# Patient Record
Sex: Male | Born: 1940 | Race: White | Hispanic: No | Marital: Married | Smoking: Former smoker
Health system: Southern US, Community
[De-identification: ages and names within clinical notes are randomized; demographics above are authoritative.]

## PROBLEM LIST (undated history)

## (undated) DIAGNOSIS — K449 Diaphragmatic hernia without obstruction or gangrene: Secondary | ICD-10-CM

## (undated) DIAGNOSIS — D1803 Hemangioma of intra-abdominal structures: Secondary | ICD-10-CM

## (undated) DIAGNOSIS — S8290XA Unspecified fracture of unspecified lower leg, initial encounter for closed fracture: Secondary | ICD-10-CM

## (undated) DIAGNOSIS — C4491 Basal cell carcinoma of skin, unspecified: Secondary | ICD-10-CM

## (undated) DIAGNOSIS — K5792 Diverticulitis of intestine, part unspecified, without perforation or abscess without bleeding: Secondary | ICD-10-CM

## (undated) DIAGNOSIS — E119 Type 2 diabetes mellitus without complications: Secondary | ICD-10-CM

## (undated) DIAGNOSIS — E785 Hyperlipidemia, unspecified: Secondary | ICD-10-CM

## (undated) DIAGNOSIS — N529 Male erectile dysfunction, unspecified: Secondary | ICD-10-CM

## (undated) DIAGNOSIS — E042 Nontoxic multinodular goiter: Secondary | ICD-10-CM

## (undated) DIAGNOSIS — H269 Unspecified cataract: Secondary | ICD-10-CM

## (undated) DIAGNOSIS — N200 Calculus of kidney: Secondary | ICD-10-CM

## (undated) DIAGNOSIS — K219 Gastro-esophageal reflux disease without esophagitis: Secondary | ICD-10-CM

## (undated) DIAGNOSIS — I2510 Atherosclerotic heart disease of native coronary artery without angina pectoris: Secondary | ICD-10-CM

## (undated) DIAGNOSIS — E559 Vitamin D deficiency, unspecified: Secondary | ICD-10-CM

## (undated) DIAGNOSIS — I469 Cardiac arrest, cause unspecified: Secondary | ICD-10-CM

## (undated) HISTORY — DX: Calculus of kidney: N20.0

## (undated) HISTORY — PX: TIBIA FRACTURE SURGERY: SHX806

## (undated) HISTORY — DX: Nontoxic multinodular goiter: E04.2

## (undated) HISTORY — DX: Hyperlipidemia, unspecified: E78.5

## (undated) HISTORY — DX: Gastro-esophageal reflux disease without esophagitis: K21.9

## (undated) HISTORY — PX: EYE SURGERY: SHX253

## (undated) HISTORY — DX: Unspecified fracture of unspecified lower leg, initial encounter for closed fracture: S82.90XA

## (undated) HISTORY — DX: Basal cell carcinoma of skin, unspecified: C44.91

## (undated) HISTORY — DX: Atherosclerotic heart disease of native coronary artery without angina pectoris: I25.10

## (undated) HISTORY — DX: Diaphragmatic hernia without obstruction or gangrene: K44.9

## (undated) HISTORY — DX: Vitamin D deficiency, unspecified: E55.9

## (undated) HISTORY — DX: Cardiac arrest, cause unspecified: I46.9

## (undated) HISTORY — PX: OTHER SURGICAL HISTORY: SHX169

## (undated) HISTORY — DX: Hemangioma of intra-abdominal structures: D18.03

## (undated) HISTORY — PX: CATARACT EXTRACTION: SUR2

## (undated) HISTORY — DX: Male erectile dysfunction, unspecified: N52.9

## (undated) HISTORY — DX: Diverticulitis of intestine, part unspecified, without perforation or abscess without bleeding: K57.92

## (undated) HISTORY — DX: Unspecified cataract: H26.9

---

## 1978-04-19 DIAGNOSIS — N200 Calculus of kidney: Secondary | ICD-10-CM

## 1978-04-19 HISTORY — DX: Calculus of kidney: N20.0

## 1982-04-19 HISTORY — PX: BACK SURGERY: SHX140

## 2000-03-07 ENCOUNTER — Ambulatory Visit (HOSPITAL_COMMUNITY): Admission: RE | Admit: 2000-03-07 | Discharge: 2000-03-07 | Payer: Self-pay | Admitting: Family Medicine

## 2000-03-07 ENCOUNTER — Encounter: Payer: Self-pay | Admitting: Family Medicine

## 2000-03-21 ENCOUNTER — Encounter: Payer: Self-pay | Admitting: Family Medicine

## 2000-03-21 ENCOUNTER — Ambulatory Visit (HOSPITAL_COMMUNITY): Admission: RE | Admit: 2000-03-21 | Discharge: 2000-03-21 | Payer: Self-pay | Admitting: Family Medicine

## 2000-03-29 ENCOUNTER — Other Ambulatory Visit: Admission: RE | Admit: 2000-03-29 | Discharge: 2000-03-29 | Payer: Self-pay | Admitting: General Surgery

## 2000-11-10 ENCOUNTER — Ambulatory Visit (HOSPITAL_BASED_OUTPATIENT_CLINIC_OR_DEPARTMENT_OTHER): Admission: RE | Admit: 2000-11-10 | Discharge: 2000-11-10 | Payer: Self-pay | Admitting: Plastic Surgery

## 2000-11-10 ENCOUNTER — Encounter (INDEPENDENT_AMBULATORY_CARE_PROVIDER_SITE_OTHER): Payer: Self-pay | Admitting: *Deleted

## 2000-11-30 ENCOUNTER — Encounter: Payer: Self-pay | Admitting: General Surgery

## 2000-11-30 ENCOUNTER — Ambulatory Visit (HOSPITAL_COMMUNITY): Admission: RE | Admit: 2000-11-30 | Discharge: 2000-11-30 | Payer: Self-pay | Admitting: General Surgery

## 2001-07-06 ENCOUNTER — Encounter: Payer: Self-pay | Admitting: Cardiology

## 2004-10-15 ENCOUNTER — Ambulatory Visit: Payer: Self-pay | Admitting: Cardiology

## 2005-09-23 ENCOUNTER — Ambulatory Visit (HOSPITAL_COMMUNITY): Admission: RE | Admit: 2005-09-23 | Discharge: 2005-09-23 | Payer: Self-pay | Admitting: Family Medicine

## 2005-11-03 ENCOUNTER — Ambulatory Visit: Payer: Self-pay | Admitting: Cardiology

## 2005-11-09 ENCOUNTER — Ambulatory Visit: Payer: Self-pay

## 2005-11-25 ENCOUNTER — Ambulatory Visit: Payer: Self-pay

## 2006-04-19 HISTORY — PX: LACERATION REPAIR: SHX5168

## 2006-12-28 ENCOUNTER — Observation Stay (HOSPITAL_COMMUNITY): Admission: EM | Admit: 2006-12-28 | Discharge: 2006-12-29 | Payer: Self-pay | Admitting: Emergency Medicine

## 2006-12-28 ENCOUNTER — Other Ambulatory Visit: Payer: Self-pay | Admitting: Emergency Medicine

## 2007-05-10 ENCOUNTER — Ambulatory Visit (HOSPITAL_COMMUNITY): Admission: RE | Admit: 2007-05-10 | Discharge: 2007-05-10 | Payer: Self-pay | Admitting: General Surgery

## 2008-02-19 ENCOUNTER — Ambulatory Visit: Payer: Self-pay | Admitting: Gastroenterology

## 2008-03-05 ENCOUNTER — Ambulatory Visit: Payer: Self-pay | Admitting: Gastroenterology

## 2008-12-18 ENCOUNTER — Encounter: Payer: Self-pay | Admitting: Cardiology

## 2009-01-09 DIAGNOSIS — Z794 Long term (current) use of insulin: Secondary | ICD-10-CM

## 2009-01-09 DIAGNOSIS — K449 Diaphragmatic hernia without obstruction or gangrene: Secondary | ICD-10-CM

## 2009-01-09 DIAGNOSIS — Z85828 Personal history of other malignant neoplasm of skin: Secondary | ICD-10-CM

## 2009-01-09 DIAGNOSIS — Z8719 Personal history of other diseases of the digestive system: Secondary | ICD-10-CM

## 2009-01-09 DIAGNOSIS — E119 Type 2 diabetes mellitus without complications: Secondary | ICD-10-CM

## 2009-01-15 ENCOUNTER — Ambulatory Visit: Payer: Self-pay | Admitting: Cardiology

## 2009-01-15 DIAGNOSIS — E782 Mixed hyperlipidemia: Secondary | ICD-10-CM

## 2009-01-15 DIAGNOSIS — R9431 Abnormal electrocardiogram [ECG] [EKG]: Secondary | ICD-10-CM

## 2010-07-15 ENCOUNTER — Encounter: Payer: Self-pay | Admitting: *Deleted

## 2010-07-15 DIAGNOSIS — E042 Nontoxic multinodular goiter: Secondary | ICD-10-CM | POA: Insufficient documentation

## 2010-07-15 DIAGNOSIS — N200 Calculus of kidney: Secondary | ICD-10-CM | POA: Insufficient documentation

## 2010-07-15 DIAGNOSIS — C4491 Basal cell carcinoma of skin, unspecified: Secondary | ICD-10-CM | POA: Insufficient documentation

## 2010-07-15 DIAGNOSIS — K4090 Unilateral inguinal hernia, without obstruction or gangrene, not specified as recurrent: Secondary | ICD-10-CM | POA: Insufficient documentation

## 2010-08-19 ENCOUNTER — Encounter: Payer: Self-pay | Admitting: Family Medicine

## 2010-09-01 NOTE — Op Note (Signed)
NAME:  [name] [name], [name] [name] NO.:  [id number]   MEDICAL RECORD NO.:  [id number]          PATIENT TYPE:  INP   LOCATION:  1833                         FACILITY:  [facility]   PHYSICIAN:  Johnette Abraham, MD    DATE OF BIRTH:  [birth year]   DATE OF PROCEDURE:  12/28/2006  DATE OF DISCHARGE:  12/29/2006                               OPERATIVE REPORT   PREOPERATIVE DIAGNOSIS:  Complex laceration with possible tendon and  nerve involvement to the left thumb.   POSTOPERATIVE DIAGNOSIS:  Complex laceration with possible tendon and  nerve involvement to the left thumb.   SURGEON:  Harrill C. Izora Ribas, M.D.   ANESTHESIA:  General.   PROCEDURE:  Exploration of complex wound, left thumb.  Repair of the  abductor pollicis brevis.  Repair of the opponens pollicis muscle.  Complex layered closure of the wound.   INDICATIONS:  Mr. Bisping is a pleasant 70 year old gentleman who was  working on a ladder on an air conditioner apparatus and fell sustaining  a complex laceration to his left thumb.  He was recently seen at [facility], transferred to [facility] ER where hand surgery was consulted  emergently.  I evaluated the patient and felt he needed operative  exploration.  Risks, benefits and alternatives of surgery were discussed  with the patient including bleeding, infection, possible nerve and  possible loss of function.  He agreed to proceed.  Consent was obtained.   PROCEDURE:  The patient was taken to the operating room, placed supine  on the operating room table.  General anesthesia was administered.  The  extremity was prepped and draped in a normal sterile fashion.  The arm  was exsanguinated with an Esmarch.  The tourniquet was inflated to 250  mmHg.  The wound was explored.  Skin, subcutaneous tissue and muscle  were debrided back of nonviable and devitalized tissue.  It was evident  that the laceration was at the base of the thumb, that it involved the  abductor pollicis  brevis, the opponens pollicis, however, the majority  of the flexor pollicis brevis was intact.  Exploration did not reveal  laceration to the median nerve, radial artery or the radial nerve.  The  recurrent branch of the median nerve was not located.  The wound was  thoroughly irrigated with antibiotic solution.  Note the abductor  pollicis brevis and the opponens pollicis muscles were sutured back to  their origins with several interrupted 4-0 Vicryls.  The subcutaneous  layers were approximated with 4-0 Vicryl sutures and the skin was closed  with interrupted 4-0 nylon sutures.  The total length was approximately  11 cm.  Afterwards, antibiotic ointment, a sterile dressing and a thumb  spica splint were placed.  Upon releasing the tourniquet, all fingertips  returned to pink color with good capillary refill.  The patient  tolerated anesthesia well, was taken to the recovery room in stable  condition.     Johnette Abraham, MD  Electronically Signed    HCC/MEDQ  D:  12/30/2006  T:  12/30/2006  Job:  [id number]

## 2010-09-04 NOTE — Op Note (Signed)
[place]  Patient:    [name] [name], [name] [name]                        MRN: [id number] Proc. Date: 11/10/00 Attending:  Mary A. Contogiannis, M.D.                           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  POSTOPERATIVE DIAGNOSIS:  A 1.0 cm basal cell carcinoma - left forehead.  PROCEDURES: 1. Excision of 1.0 cm basal cell carcinoma - left forehead with intraoperative    frozen section diagnosis. 2. Complex closure of 2.0 cm left forehead incision.  SURGEON:  Mary A. Contogiannis, M.D.  ANESTHESIA:  1% lidocaine with epinephrine.  COMPLICATIONS: None.  INDICATIONS FOR THE PROCEDURE:  The patient is a 70 year old Caucasian male, who has a biopsy-proven basal cell carcinoma.  He was seen by Dr. Campbell Stall, who biopsied the lesion and referred him to me for excision of the lesion.  He now presents for that excision.  DESCRIPTION OF PROCEDURE:  The patient was brought into the procedure room and placed on the OR table in supine position.  The left forehead was prepped with Betadine and draped in a sterile fashion.  The skin and subcutaneous tissues in the area of the biopsy site and the basal cell cancer were then injected with 1% lidocaine with epinephrine.  After adequate anesthesia and hemostasis had taken effect, the procedure was begun.  Using loupe magnification, the apparent basal cell cancer was excised with at least 1 mm of normal skin around it in a circumferential fashion.  This was done full-thickness through the skin into the subcutaneous tissues.  The specimen was marked at the 12 oclock position and passed off the table to undergo intraoperative frozen section diagnosis.  After consultation with the pathologist, he indicated that all of the skin margins were free of any cancer.  There was some rather extensive actinic damage to the patients skin, but this is expected in someone,  who has a lot of sun damage.  The skin edges were then undermined for easy closure.  Meticulous hemostasis was obtained.  The wound was then closed in complex fashion.  The deeper subcutaneous tissues were closed with 4-0 Monocryl interrupted sutures.  The dermal layer was closed with 4-0 Monocryl interrupted sutures.  The skin was then closed with 6-0 Prolene in a running baseball type stitch.  The incision was dressed with Steri-Strips.  There were no complications.  The patient tolerated the procedure well.  The patient was then discharged home in stable condition.  He was instructed to keep the wound and Steri-Strips dry.  A follow-up appointment is tomorrow in the office. DD:  11/10/00 TD:  11/10/00 Job: [id number]

## 2010-09-04 NOTE — Assessment & Plan Note (Signed)
[facility]                              CARDIOLOGY OFFICE NOTE   [name] [name], [name] [name]                        MRN:          [id number]  DATE:11/03/2005                            DOB:          03-13-1941    PRIMARY:  Dr. Vernon Prey   REASON FOR PRESENTATION:  Evaluate patient with abnormal stress test.   HISTORY OF PRESENT ILLNESS:  Patient returns after a year.  He had a stress  test last year that was very abnormal with significant ectopy.  We managed  him medically at that time with risk reduction with plans to see him back  and probably screen him with another stress test.  He did have an  echocardiogram last year that demonstrated well-preserved ejection fraction  and mild aortic sclerosis.  There were no significant abnormalities.   The patient has since retired from his job.  He is not quite as active as he  used to be but he still does yard work.  He does not report any symptoms.  He does not notice palpitations.  He has not had any chest discomfort, neck  discomfort, arm discomfort, activity-induced nausea, vomiting, excessive  diaphoresis.  He has had no palpitations, presyncope, or syncope.  He has  had no PND or orthopnea.   PAST MEDICAL HISTORY:  1.  Non-insulin-dependent diabetes mellitus times approximately 10 years.  2.  Liver hemangioma.  3.  Hiatal hernia with reflux.  4.  Erectile dysfunction.  5.  Non-toxic multinodular goiter.  6.  Basal cell cancer.  7.  Diverticulosis.  8.  Nephrolithiasis.  9.  Left leg fracture.  10. Back surgery.   ALLERGIES:  ZESTRIL causes a rash, ALTACE caused diarrhea.   MEDICATIONS:  1.  Garlic.  2.  Aspirin 81 mg daily.  3.  Accupril 10 mg daily.  4.  Synthroid 75 mcg daily.  5.  Multivitamin.  6.  Januvia 100 mg daily.  7.  Glyburide 6 mg b.i.d.   REVIEW OF SYSTEMS:  As stated in the HPI and otherwise negative for other  systems.   PHYSICAL EXAMINATION:  GENERAL:  Patient is in no  distress.  VITAL SIGNS:  Blood pressure 142/82, heart rate 78 and regular, weight 176  pounds, body mass index 25.  HEENT:  Eyelids unremarkable.  Pupils are equal, round, and reactive to  light.  Fundi not visualized.  NECK:  No jugular venous distention at 45 degrees.  Carotid upstroke brisk  and symmetric.  No bruits.  No thyromegaly.  LYMPHATICS:  No cervical, axillary, or inguinal adenopathy.  LUNGS:  Clear to auscultation bilaterally.  BACK:  No costovertebral angle tenderness.  CHEST:  Unremarkable.  HEART:  PMI not displaced or sustained.  S1 and S2 within normal limits.  No  S3.  No S4.  No murmurs.  ABDOMEN:  Flat.  Positive bowel sounds.  Normal in frequency and pitch.  No  bruits, rebound, guarding.  No midline pulsatile mass.  No hepatomegaly,  splenomegaly.  SKIN:  No rashes.  No nodules.  EXTREMITIES:  2+ pulse throughout.  No edema.  No clubbing, cyanosis.  NEUROLOGIC:  Oriented to person, place, time.  Cranial nerves II-XII grossly  intact.  Motor grossly intact.   EKG:  Sinus rhythm, rate 78, axis within normal limits, intervals within  normal limits, no acute ST-T wave changes.   ASSESSMENT AND PLAN:  1.  Abnormal stress test.  The patient had an abnormal stress test with      __________ ventricular ectopy last year.  I think he needs to be      screened with a treadmill test given his significant risk factors.  In      particular, his diabetes is slightly above the goal set by Dr. Christell Constant.      His blood pressure runs slightly high at times.  I think screening with      a stress test is warranted.  He has a moderate pre test probability with      obstructive coronary artery disease.  He had abnormal treadmill last      time.  I think at this point I will order a stress perfusion study for      the added sensitivity.  2.  Hypertension.  Blood pressure is slightly elevated.  Will see what it      does with stress.  He may need increased Accupril.  3.  Follow-up  will be every couple of years if his stress test is okay.                               Rollene Rotunda, MD, [facility]    JH/MedQ  DD:  11/03/2005  DT:  11/03/2005  Job #:  [id number]   cc:   Ernestina Penna, MD

## 2010-10-29 ENCOUNTER — Encounter: Payer: Self-pay | Admitting: Cardiology

## 2010-11-27 ENCOUNTER — Encounter: Payer: Self-pay | Admitting: Cardiology

## 2011-01-19 LAB — GLUCOSE, CAPILLARY
Glucose-Capillary: 233 — ABNORMAL HIGH
Glucose-Capillary: 248 — ABNORMAL HIGH

## 2011-01-29 LAB — COMPREHENSIVE METABOLIC PANEL
AST: 21
BUN: 15
CO2: 24
Calcium: 8.8
Chloride: 107
Creatinine, Ser: 1.02
GFR calc Af Amer: >60
GFR calc non Af Amer: >60
Total Bilirubin: 0.6

## 2011-01-29 LAB — CBC
Platelets: 214
RBC: 4.8
WBC: 8.1

## 2011-01-29 LAB — DIFFERENTIAL
Basophils Absolute: 0
Lymphocytes Relative: 21
Lymphs Abs: 1.7
Neutro Abs: 5.7
Neutrophils Relative %: 70

## 2011-01-29 LAB — PROTIME-INR
INR: 0.9
Prothrombin Time: 12.8

## 2011-01-29 LAB — APTT: aPTT: 25

## 2011-02-10 ENCOUNTER — Encounter: Payer: Self-pay | Admitting: Cardiology

## 2011-02-10 ENCOUNTER — Ambulatory Visit (INDEPENDENT_AMBULATORY_CARE_PROVIDER_SITE_OTHER): Payer: Medicare Other | Admitting: Cardiology

## 2011-02-10 DIAGNOSIS — E782 Mixed hyperlipidemia: Secondary | ICD-10-CM

## 2011-02-10 DIAGNOSIS — R9431 Abnormal electrocardiogram [ECG] [EKG]: Secondary | ICD-10-CM

## 2011-02-10 DIAGNOSIS — Z8249 Family history of ischemic heart disease and other diseases of the circulatory system: Secondary | ICD-10-CM

## 2011-02-10 NOTE — Patient Instructions (Addendum)
Your physician has requested that you have an exercise tolerance test. For further information please visit [email or web address]. Please also follow instruction sheet, as given.  The current medical regimen is effective;  continue present plan and medications.  

## 2011-02-10 NOTE — Assessment & Plan Note (Signed)
His LDL was 78 with an HDL of 44.  This is excellent and no change in therapy is indicated.

## 2011-02-10 NOTE — Assessment & Plan Note (Signed)
I will bring the patient back for a POET (Plain Old Exercise Test). This will allow me to screen for obstructive coronary disease, risk stratify and very importantly provide a prescription for exercise.  I do note that he had artifact on the last EKG. However, we might be able to avoid this with an aggressive skin  prep.

## 2011-02-10 NOTE — Progress Notes (Signed)
HPI The patient presents for followup of multiple cardiovascular risk factors. I saw 2 years ago after an exercise treadmill test which was on the first level secondary to artifact. Stress perfusion imaging was normal. He does have significant cardiovascular risk. Since I last saw him he has had no new cardiovascular symptoms. He denies any chest discomfort, neck or arm discomfort. He does not have palpitations, presyncope or syncope. He does not have PND or orthopnea. He has not had weight gain or edema. He golfs once per week.  He does yard work without symptoms.  Allergies  Allergen Reactions  . Actos (Pioglitazone Hydrochloride) Swelling    Fluid retention  . Lisinopril Rash  . Niaspan (Niacin (Antihyperlipidemic)) Other (See Comments)    Increased blood sugar  . Ramipril Diarrhea    Current Outpatient Prescriptions  Medication Sig Dispense Refill  . aspirin 81 MG tablet Take 81 mg by mouth daily.        . Cholecalciferol (VITAMIN D-3) 5000 UNITS TABS Take by mouth daily.        . [employer]S CINNAMON PO Take 2,000 mg by mouth daily.        . Garlic 1000 MG CAPS Take by mouth daily.        . insulin glargine (LANTUS) 100 UNIT/ML injection Inject 36 Units into the skin every morning.        Marland Kitchen levothyroxine (SYNTHROID, LEVOTHROID) 75 MCG tablet Take 75 mcg by mouth daily.        . metFORMIN (GLUCOPHAGE) 1000 MG tablet Take 1,000 mg by mouth 2 (two) times daily with a meal.        . quinapril (ACCUPRIL) 20 MG tablet Take 20 mg by mouth at bedtime.        . ranitidine (ZANTAC) 150 MG tablet Take 150 mg by mouth as needed.          Past Medical History  Diagnosis Date  . Diabetes mellitus   . Liver hemangioma   . Gastroesophageal reflux disease with hiatal hernia   . Erectile dysfunction   . Goiter, nontoxic, multinodular     with macrocyst  . Basal cell carcinoma     left forehead, nose, neck  . Diverticulitis   . Inguinal hernia     right side  . Kidney stone 1980  . Hiatal hernia       With reflex  . Nephrolithiasis   . Leg fracture     Left    Past Surgical History  Procedure Date  . Laceration repair 2008    Dr. Izora Ribas -to hand  . Back surgery 1984  . Tibia fracture surgery     left leg    ROS:  As stated in the HPI and negative for all other systems.  PHYSICAL EXAM BP 130/80  Pulse 78  Resp 16  Ht 5\' 9"  (1.753 m)  Wt 182 lb (82.555 kg)  BMI 26.88 kg/m2 GENERAL:  Well appearing HEENT:  Pupils equal round and reactive, fundi not visualized, oral mucosa unremarkable NECK:  No jugular venous distention, waveform within normal limits, carotid upstroke brisk and symmetric, no bruits, no thyromegaly LYMPHATICS:  No cervical, inguinal adenopathy LUNGS:  Clear to auscultation bilaterally BACK:  No CVA tenderness CHEST:  Unremarkable HEART:  PMI not displaced or sustained,S1 and S2 within normal limits, no S3, no S4, no clicks, no rubs, no murmurs ABD:  Flat, positive bowel sounds normal in frequency in pitch, no bruits, no rebound, no guarding, no midline  pulsatile mass, no hepatomegaly, no splenomegaly EXT:  2 plus pulses throughout, no edema, no cyanosis no clubbing SKIN:  No rashes no nodules NEURO:  Cranial nerves II through XII grossly intact, motor grossly intact throughout [facility]:  Cognitively intact, oriented to person place and time   EKG:  Sinus rhythm, rate 81, axis within normal limits, intervals within normal limits, no acute ST-T wave changes, premature ectopic complexes 02/10/2011   ASSESSMENT AND PLAN

## 2011-03-01 ENCOUNTER — Ambulatory Visit (INDEPENDENT_AMBULATORY_CARE_PROVIDER_SITE_OTHER): Payer: Medicare Other | Admitting: Cardiology

## 2011-03-01 DIAGNOSIS — R9431 Abnormal electrocardiogram [ECG] [EKG]: Secondary | ICD-10-CM

## 2011-03-01 DIAGNOSIS — I4891 Unspecified atrial fibrillation: Secondary | ICD-10-CM

## 2011-03-01 DIAGNOSIS — Z8249 Family history of ischemic heart disease and other diseases of the circulatory system: Secondary | ICD-10-CM

## 2011-03-01 MED ORDER — METOPROLOL SUCCINATE ER 50 MG PO TB24: 25.0000 mg | ORAL_TABLET | Freq: Once | ORAL | Status: DC

## 2011-03-01 MED ORDER — DIGOXIN 125 MCG PO TABS: 125.0000 ug | ORAL_TABLET | Freq: Once | ORAL | Status: DC

## 2011-03-01 MED ORDER — DIGOXIN 0.0625 MG HALF TABLET
0.1250 mg | ORAL_TABLET | Freq: Once | ORAL | Status: AC
  Administered 2011-03-01 (×2): 0.125 mg via ORAL

## 2011-03-01 MED ORDER — METOPROLOL TARTRATE 12.5 MG HALF TABLET
25.0000 mg | ORAL_TABLET | Freq: Once | ORAL | Status: AC
  Administered 2011-03-01: 25 mg via ORAL

## 2011-03-01 NOTE — Progress Notes (Addendum)
Exercise Treadmill Test  Pre-Exercise Testing Evaluation Rhythm: normal sinus  Rate: 91   PR:  .19 QRS:  .08  QT:  .34 QTc: .42     Test  Exercise Tolerance Test Ordering MD: Angelina Sheriff, MD  Interpreting MD:  Angelina Sheriff, MD  Unique Test No: 1  Treadmill:  1  Indication for ETT: Abnormal EKG   Contraindication to ETT: No   Stress Modality: exercise - treadmill  Cardiac Imaging Performed: non   Protocol: standard Bruce - maximal  Max BP:  210/87  Max MPHR (bpm):  149 85% MPR (bpm):  127  MPHR obtained (bpm):  1 % MPHR obtained:  112  Reached 85% MPHR (min:sec):  4:00 Total Exercise Time (min-sec):  10:00  Workload in METS:  11.1 Borg Scale: 15  Reason ETT Terminated:  desired heart rate attained    ST Segment Analysis At Rest: normal ST segments - no evidence of significant ST depression With Exercise: no evidence of significant ST depression  Other Information Arrhythmia:  No Angina during ETT:  absent (0) Quality of ETT:  diagnostic  ETT Interpretation:  normal - no evidence of ischemia by ST analysis  Comments: The patient had an excellent exercise tolerance.  There was no chest pain.  There was an appropriate level of dyspnea.  There were no arrhythmias, a normal heart rate response and normal BP response.  There were no ischemic ST T wave changes and a normal heart rate recovery.  Recommendations: Negative adequate ETT.  No further testing is indicated.  Based on the above I gave the patient a prescription for exercise  After the procedure and prior to being disconnected from telemetry the patient developed atrial fibrillation. This was asymptomatic. We did monitor him giving him 25 mg of metoprolol and 0.25 mg of digoxin by mouth. He subsequently converted to sinus rhythm. Again he had no symptoms. I will address this in the future though at this point he is very low risk for thromboembolic events and I would not start Coumadin based on this one episode.

## 2011-03-01 NOTE — Progress Notes (Signed)
Addended by: Sharin Grave on: 03/01/2011 12:26 PM   Modules accepted: Orders

## 2012-07-27 ENCOUNTER — Ambulatory Visit (INDEPENDENT_AMBULATORY_CARE_PROVIDER_SITE_OTHER): Payer: Medicare Other | Admitting: Pharmacist

## 2012-07-27 ENCOUNTER — Telehealth: Payer: Self-pay | Admitting: Pharmacist

## 2012-07-27 VITALS — BP 132/70 | HR 70 | Ht 69.5 in | Wt 176.0 lb

## 2012-07-27 DIAGNOSIS — E119 Type 2 diabetes mellitus without complications: Secondary | ICD-10-CM

## 2012-07-27 LAB — BASIC METABOLIC PANEL
BUN: 14 mg/dL (ref 6–23)
Chloride: 102 mEq/L (ref 96–112)
Creat: 1.02 mg/dL (ref 0.50–1.35)
Glucose, Bld: 95 mg/dL (ref 70–99)
Potassium: 4.4 mEq/L (ref 3.5–5.3)

## 2012-07-27 MED ORDER — CANAGLIFLOZIN 300 MG PO TABS: 300.0000 mg | ORAL_TABLET | ORAL | Status: DC

## 2012-07-27 NOTE — Progress Notes (Signed)
Diabetes Follow-Up Visit Chief Complaint:   Chief Complaint  Patient presents with  . Diabetes    Filed Vitals:   07/27/12 0959  BP: 132/70  Pulse: 70    Exam Edema:  neg  Polyuria:  beg  Polydipsia:  neg Polyphagia:  neg  BMI:  Body mass index is 25.63 kg/(m^2).   Weight changes:  stable General Appearance:  alert, oriented, no acute distress and well nourished Mood/Affect:  normal  HPI:  Patient was seen last month and started invokana 100mg  qd in an attempt to get BG under better control and hopefully greatly reduce and/or eliminate the need for lantus. Patient feels BG has improved but not sure it has improved enough to discontinue lantus insulin  Current Diabetes  Medications:  Lantus 30unit qd, Invokana 100mg  1qam, and metformin 1000mg  bid with food  Low fat/carbohydrate diet?  Yes Nicotine Abuse?  No Medication Compliance?  Yes Exercise?  Yes Alcohol Abuse?  No  Home BG Monitoring:  Checking 1 times a day. Average:  109  High: 140  Low:  73   a1c = 8.1% (05/31/2012)   No results found for this basename: MICROALBUR, MALB24HUR    No results found for this basename: CHOL, HDL, LDLCALC, LDLDIRECT, TRIG, CHOLHDL      Assessment: 1.  Diabetes.  Improving control 2.  Blood Pressure.  controlled   Recommendations: 1.  Checking BMET today - if Scr stable then will increase Invokana to 300mg  qam and hold lantus.  Continue metformin 1000mg  bid with food 2.  BP goal < 140/90 3.  Return to clinic in 2 months  Orders Placed This Encounter  Procedures  . Basic metabolic panel

## 2012-07-27 NOTE — Telephone Encounter (Signed)
Scr stable - continue with plan to increase invokana to 300mg  qd.  Calcium was slightly increased. Patient does not take any MVI or calcium supplements.   Advised to hold vitamin D and recheck BMET in 2 -4 weeks.

## 2012-08-22 ENCOUNTER — Telehealth: Payer: Self-pay | Admitting: *Deleted

## 2012-08-22 NOTE — Telephone Encounter (Signed)
Has developed a diffuse pruritic rash since Invokana was increased to 300mg .  Denies any other complications. Last took it this morning.   Has not taken anything but has applied some hydrocortisone.    Suggested he take Benadryl 25mg  OTC.  May take up to 50mg  if 25mg  doesn't help.  Advised that it may make him drowsy.  He has taken Benadryl in the past without any problems.  Advised that I will forward this message to the clinical pharmacist and that we will contact him with instructions.

## 2012-08-22 NOTE — Telephone Encounter (Signed)
Left message for patient with instructions to hold Invokana for next 3-5 days and see if rash improves.  Call office ASAP if experiences any SOB, difficult swallowing or swelling.  Patient to call office next week for instructions as to whether we will retry Invokana or changed medication.

## 2012-08-30 ENCOUNTER — Ambulatory Visit (INDEPENDENT_AMBULATORY_CARE_PROVIDER_SITE_OTHER): Payer: Medicare Other | Admitting: Physician Assistant

## 2012-08-30 VITALS — BP 127/79 | HR 76 | Temp 97.1°F | Ht 69.5 in | Wt 176.0 lb

## 2012-08-30 DIAGNOSIS — L509 Urticaria, unspecified: Secondary | ICD-10-CM

## 2012-08-30 MED ORDER — HYDROXYZINE HCL 10 MG PO TABS: 10.0000 mg | ORAL_TABLET | Freq: Three times a day (TID) | ORAL | Status: DC | PRN

## 2012-08-30 MED ORDER — METHYLPREDNISOLONE ACETATE 80 MG/ML IJ SUSP
80.0000 mg | Freq: Once | INTRAMUSCULAR | Status: AC
  Administered 2012-08-30: 80 mg via INTRAMUSCULAR

## 2012-08-30 NOTE — Patient Instructions (Addendum)
Hives Hives are itchy, red, swollen areas of the skin. They can vary in size and location on your body. Hives can come and go for hours or several days (acute hives) or for several weeks (chronic hives). Hives do not spread from person to person (noncontagious). They may get worse with scratching, exercise, and emotional stress. CAUSES   Allergic reaction to food, additives, or drugs.  Infections, including the common cold.  Illness, such as vasculitis, lupus, or thyroid disease.  Exposure to sunlight, heat, or cold.  Exercise.  Stress.  Contact with chemicals. SYMPTOMS   Red or white swollen patches on the skin. The patches may change size, shape, and location quickly and repeatedly.  Itching.  Swelling of the hands, feet, and face. This may occur if hives develop deeper in the skin. DIAGNOSIS  Your caregiver can usually tell what is wrong by performing a physical exam. Skin or blood tests may also be done to determine the cause of your hives. In some cases, the cause cannot be determined. TREATMENT  Mild cases usually get better with medicines such as antihistamines. Severe cases may require an emergency epinephrine injection. If the cause of your hives is known, treatment includes avoiding that trigger.  HOME CARE INSTRUCTIONS   Avoid causes that trigger your hives.  Take antihistamines as directed by your caregiver to reduce the severity of your hives. Non-sedating or low-sedating antihistamines are usually recommended. Do not drive while taking an antihistamine.  Take any other medicines prescribed for itching as directed by your caregiver.  Wear loose-fitting clothing.  Keep all follow-up appointments as directed by your caregiver. SEEK MEDICAL CARE IF:   You have persistent or severe itching that is not relieved with medicine.  You have painful or swollen joints. SEEK IMMEDIATE MEDICAL CARE IF:   You have a fever.  Your tongue or lips are swollen.  You have  trouble breathing or swallowing.  You feel tightness in the throat or chest.  You have abdominal pain. These problems may be the first sign of a life-threatening allergic reaction. Call your local emergency services (911 in [place] MAKE SURE YOU:   Understand these instructions.  Will watch your condition.  Will get help right away if you are not doing well or get worse. Document Released: 04/05/2005 Document Revised: 10/05/2011 Document Reviewed: 06/29/2011 [facility] Patient Information 2013 [place], [place].  Methylprednisolone Suspension for Injection What is this medicine? METHYLPREDNISOLONE (meth ill pred NISS oh lone) is a corticosteroid. It is commonly used to treat inflammation of the skin, joints, lungs, and other organs. Common conditions treated include asthma, allergies, and arthritis. It is also used for other conditions, such as blood disorders and diseases of the adrenal glands. This medicine may be used for other purposes; ask your health care provider or pharmacist if you have questions. What should I tell my health care provider before I take this medicine? They need to know if you have any of these conditions: -cataracts or glaucoma -Cushings -heart disease -high blood pressure -infection including tuberculosis -low calcium or potassium levels in the blood -recent surgery -seizures -stomach or intestinal disease, including colitis -threadworms -thyroid problems -an unusual or allergic reaction to methylprednisolone, corticosteroids, benzyl alcohol, other medicines, foods, dyes, or preservatives -pregnant or trying to get pregnant -breast-feeding How should I use this medicine? This medicine is for injection into a muscle, joint, or other tissue. It is given by a health care professional in a hospital or clinic setting. Talk to your pediatrician regarding  the use of this medicine in children. While this drug may be prescribed for selected conditions, precautions  do apply. Overdosage: If you think you have taken too much of this medicine contact a poison control center or emergency room at once. NOTE: This medicine is only for you. Do not share this medicine with others. What if I miss a dose? This does not apply. What may interact with this medicine? Do not take this medicine with any of the following medications: -mifepristone -radiopaque contrast agents This medicine may also interact with the following medications: -aspirin and aspirin-like medicines -cyclosporin -ketoconazole -phenobarbital -phenytoin -rifampin -tacrolimus -troleandomycin -vaccines -warfarin This list may not describe all possible interactions. Give your health care provider a list of all the medicines, herbs, non-prescription drugs, or dietary supplements you use. Also tell them if you smoke, drink alcohol, or use illegal drugs. Some items may interact with your medicine. What should I watch for while using this medicine? Visit your doctor or health care professional for regular checks on your progress. If you are taking this medicine for a long time, carry an identification card with your name and address, the type and dose of your medicine, and your doctor's name and address. The medicine may increase your risk of getting an infection. Stay away from people who are sick. Tell your doctor or health care professional if you are around anyone with measles or chickenpox. You may need to avoid some vaccines. Talk to your health care provider for more information. If you are going to have surgery, tell your doctor or health care professional that you have taken this medicine within the last twelve months. Ask your doctor or health care professional about your diet. You may need to lower the amount of salt you eat. The medicine can increase your blood sugar. If you are a diabetic check with your doctor if you need help adjusting the dose of your diabetic medicine. What side  effects may I notice from receiving this medicine? Side effects that you should report to your doctor or health care professional as soon as possible: -allergic reactions like skin rash, itching or hives, swelling of the face, lips, or tongue -bloody or tarry stools -changes in vision -eye pain or bulging eyes -fever, sore throat, sneezing, cough, or other signs of infection, wounds that will not heal -increased thirst -irregular heartbeat -muscle cramps -pain in hips, back, ribs, arms, shoulders, or legs -swelling of the ankles, feet, hands -trouble passing urine or change in the amount of urine -unusual bleeding or bruising -unusually weak or tired -weight gain or weight loss Side effects that usually do not require medical attention (report to your doctor or health care professional if they continue or are bothersome): -changes in emotions or moods -constipation or diarrhea -headache -irritation at site where injected -nausea, vomiting -skin problems, acne, thin and shiny skin -trouble sleeping -unusual hair growth on the face or body This list may not describe all possible side effects. Call your doctor for medical advice about side effects. You may report side effects to FDA at 1-800-FDA-1088. Where should I keep my medicine? This drug is given in a hospital or clinic and will not be stored at home. NOTE: This sheet is a summary. It may not cover all possible information. If you have questions about this medicine, talk to your doctor, pharmacist, or health care provider.  2013, [employer]. (10/25/2007 2:36:31 PM)

## 2012-08-30 NOTE — Progress Notes (Signed)
Subjective:     Patient ID: [name] [name], male   DOB: 04/08/1941, 72 y.o.   MRN: [id number]  Urticaria This is a new problem. The current episode started in the past 7 days. The problem has been gradually worsening since onset. The affected locations include the face, lips, groin, left axilla, right axilla and back. The rash is characterized by redness, swelling and itchiness. He was exposed to a new medication. Pertinent negatives include no cough, fatigue, shortness of breath or sore throat. Past treatments include antihistamine. The treatment provided no relief.     Review of Systems  Constitutional: Negative for fatigue.  HENT: Negative for sore throat.   Respiratory: Negative for cough and shortness of breath.   All other systems reviewed and are negative.  Pt eats nuts on a regular basis     Objective:   Physical Exam  HENT:  Mouth/Throat: Uvula is midline and mucous membranes are normal. No oral lesions. No edematous. No oropharyngeal exudate, posterior oropharyngeal edema or posterior oropharyngeal erythema.  Skin: Skin is warm, dry and intact. Rash noted. Rash is urticarial.  Edema noted to bottom lip     Assessment:     1. Urticaria        Plan:     Depomedrol in office today Atatrax rx- SE reviewed Cont to hold med Hold nut ingestion for 1 week F/U next week with regular provider

## 2012-09-13 ENCOUNTER — Encounter: Payer: Self-pay | Admitting: *Deleted

## 2012-10-05 ENCOUNTER — Ambulatory Visit: Payer: Self-pay | Admitting: Family Medicine

## 2012-10-16 ENCOUNTER — Encounter: Payer: Self-pay | Admitting: Family Medicine

## 2012-10-16 ENCOUNTER — Ambulatory Visit (INDEPENDENT_AMBULATORY_CARE_PROVIDER_SITE_OTHER): Payer: Medicare Other | Admitting: Family Medicine

## 2012-10-16 VITALS — BP 123/74 | HR 76 | Temp 96.8°F | Ht 70.0 in | Wt 178.0 lb

## 2012-10-16 DIAGNOSIS — R5383 Other fatigue: Secondary | ICD-10-CM

## 2012-10-16 DIAGNOSIS — E785 Hyperlipidemia, unspecified: Secondary | ICD-10-CM

## 2012-10-16 DIAGNOSIS — N400 Enlarged prostate without lower urinary tract symptoms: Secondary | ICD-10-CM

## 2012-10-16 DIAGNOSIS — E119 Type 2 diabetes mellitus without complications: Secondary | ICD-10-CM

## 2012-10-16 DIAGNOSIS — E559 Vitamin D deficiency, unspecified: Secondary | ICD-10-CM

## 2012-10-16 LAB — POCT CBC
Granulocyte percent: 78 %G (ref 37–80)
Lymph, poc: 2.3 (ref 0.6–3.4)
MCH, POC: 29.4 pg (ref 27–31.2)
MCV: 83.8 fL (ref 80–97)
MPV: 8 fL (ref 0–99.8)
POC LYMPH PERCENT: 17.5 %L (ref 10–50)
Platelet Count, POC: 239 10*3/uL (ref 142–424)
RBC: 5.3 M/uL (ref 4.69–6.13)
RDW, POC: 14.2 %
WBC: 13.2 10*3/uL — AB (ref 4.6–10.2)

## 2012-10-16 LAB — HEPATIC FUNCTION PANEL
ALT: 20 U/L (ref 0–53)
Albumin: 4.8 g/dL (ref 3.5–5.2)
Indirect Bilirubin: 0.2 mg/dL (ref 0.0–0.9)
Total Protein: 6.8 g/dL (ref 6.0–8.3)

## 2012-10-16 MED ORDER — INSULIN GLARGINE 100 UNIT/ML ~~LOC~~ SOLN: 48.0000 [IU] | SUBCUTANEOUS | Status: DC

## 2012-10-16 NOTE — Patient Instructions (Addendum)
Fall precautions discussed Continue current meds and therapeutic lifestyle changes Use respiratory precautions when mowing the yard etc. Continue saline nose spray

## 2012-10-16 NOTE — Progress Notes (Signed)
Subjective:    Patient ID: [name] [name], male    DOB: 02/02/1941, 72 y.o.   MRN: [id number]  HPI Patient comes in today for followup of chronic medical problems. This includes type 2 diabetes mellitus, hypertension, hypothyroidism, and GERD. Patient is doing much better since he stopped the invokanna,  and went back on the insulin. Blood sugars were brought in by patient and they all indicate pretty good blood sugar control.   Review of Systems  Constitutional: Positive for fatigue (slight).  HENT: Positive for congestion (slight) and postnasal drip.   Eyes: Positive for itching (due to allergies).  Respiratory: Positive for cough (slighyly prod, green).   Cardiovascular: Negative.   Gastrointestinal: Negative.   Genitourinary: Negative.   Musculoskeletal: Negative.   Skin: Negative.   Allergic/Immunologic: Positive for environmental allergies (seasonal).  Neurological: Negative.   Psychiatric/Behavioral: Positive for sleep disturbance (nightly, wakes frequently).   When not taking vitamin D.    Objective:   Physical Exam BP 123/74  Pulse 76  Temp(Src) 96.8 F (36 C) (Oral)  Ht 5\' 10"  (1.778 m)  Wt 178 lb (80.74 kg)  BMI 25.54 kg/m2  The patient appeared well nourished and normally developed, alert and oriented to time and place. Speech, behavior and judgement appear normal. Vital signs as documented.  Head exam is unremarkable. No scleral icterus or pallor noted. Minimal nasal congestion bilateral. He has lower dentures in place, mouth and throat were normal.  Neck is without jugular venous distension, thyromegally, or carotid bruits. Carotid upstrokes are brisk bilaterally. No cervical adenopathy. Lungs are clear anteriorly and posteriorly to auscultation. Normal respiratory effort. Cardiac exam reveals very slightly irregular rate and rhythm at 60 per minute. First and second heart sounds normal.  No murmurs, rubs or gallops.  Abdominal exam reveals normal bowl sounds,  no masses, no organomegaly and no aortic enlargement. No inguinal adenopathy. There is no abdominal tenderness. Prostate exam was done. The prostate was enlarged but smooth. There were no lumps or irregular areas palpated. Rectal exam was negative. There were no inguinal hernias. External genitalia were  normal. Extremities are nonedematous and both femoral and pedal pulses are normal. Skin without pallor or jaundice.  Warm and dry, without rash. Neurologic exam reveals normal deep tendon reflexes and normal sensation. A diabetic foot exam was done today.  Results for orders placed in visit on 10/16/12  POCT CBC      Result Value Range   WBC 13.2 (*) 4.6 - 10.2 K/uL   Lymph, poc 2.3  0.6 - 3.4   POC LYMPH PERCENT 17.5  10 - 50 %L   POC Granulocyte 10.3 (*) 2 - 6.9   Granulocyte percent 78.0  37 - 80 %G   RBC 5.3  4.69 - 6.13 M/uL   Hemoglobin 15.6  14.1 - 18.1 g/dL   HCT, POC 40.9  81.1 - 53.7 %   MCV 83.8  80 - 97 fL   MCH, POC 29.4  27 - 31.2 pg   MCHC 35.1  31.8 - 35.4 g/dL   RDW, POC 91.4     Platelet Count, POC 239.0  142 - 424 K/uL   MPV 8.0  0 - 99.8 fL  POCT GLYCOSYLATED HEMOGLOBIN (HGB A1C)      Result Value Range   Hemoglobin A1C 8.4%    POCT UA - MICROALBUMIN      Result Value Range   Microalbumin Ur, POC positive  Assessment & Plan:  1. Type II or unspecified type diabetes mellitus without mention of complication, not stated as uncontrolled - insulin glargine (LANTUS) 100 UNIT/ML injection; Inject 0.48 mLs (48 Units total) into the skin every morning.  Dispense: 15 pen; Refill: 4 - POCT glycosylated hemoglobin (Hb A1C); Standing - POCT UA - Microalbumin; Standing - POCT glycosylated hemoglobin (Hb A1C) - POCT UA - Microalbumin  2. Hyperlipidemia - NMR Lipoprofile with Lipids; Standing - Hepatic function panel; Standing - NMR Lipoprofile with Lipids - Hepatic function panel  3. Fatigue - POCT CBC; Standing - BASIC METABOLIC PANEL WITH GFR;  Standing - POCT CBC - BASIC METABOLIC PANEL WITH GFR  4. Vitamin D deficiency - Vitamin D 25 hydroxy; Standing - Vitamin D 25 hydroxy  5. BPH (benign prostatic hypertrophy) - PSA  6. Hypercalcemia - Basic metabolic panel  Patient Instructions  Fall precautions discussed Continue current meds and therapeutic lifestyle changes Use respiratory precautions when mowing the yard etc. Continue saline nose spray

## 2012-10-17 LAB — BASIC METABOLIC PANEL WITH GFR
BUN: 9 mg/dL (ref 6–23)
Calcium: 9.8 mg/dL (ref 8.4–10.5)
Creat: 0.78 mg/dL (ref 0.50–1.35)
GFR, Est African American: >89 mL/min
Glucose, Bld: 78 mg/dL (ref 70–99)
Potassium: 4.1 mEq/L (ref 3.5–5.3)

## 2012-10-17 LAB — MICROALBUMIN, URINE: Microalb, Ur: 1.89 mg/dL (ref 0.00–1.89)

## 2012-10-18 LAB — NMR LIPOPROFILE WITH LIPIDS
Cholesterol, Total: 128 mg/dL (ref <200)
HDL Particle Number: 36.3 umol/L (ref >=30.5)
LDL (calc): 63 mg/dL (ref <100)
LDL Particle Number: 824 nmol/L (ref <1000)
LDL Size: 20.7 nm (ref >20.5)
LP-IR Score: 76 — ABNORMAL HIGH (ref <=45)
Large VLDL-P: 5 nmol/L — ABNORMAL HIGH (ref <=2.7)
Small LDL Particle Number: 349 nmol/L (ref <=527)

## 2013-01-04 ENCOUNTER — Telehealth: Payer: Self-pay | Admitting: Family Medicine

## 2013-01-05 MED ORDER — QUINAPRIL HCL 20 MG PO TABS: 20.0000 mg | ORAL_TABLET | Freq: Every day | ORAL | Status: DC

## 2013-01-05 MED ORDER — METFORMIN HCL 1000 MG PO TABS: 1000.0000 mg | ORAL_TABLET | Freq: Two times a day (BID) | ORAL | Status: DC

## 2013-01-05 MED ORDER — LEVOTHYROXINE SODIUM 75 MCG PO TABS: 75.0000 ug | ORAL_TABLET | Freq: Every day | ORAL | Status: DC

## 2013-01-05 NOTE — Telephone Encounter (Signed)
done

## 2013-01-09 ENCOUNTER — Telehealth: Payer: Self-pay | Admitting: Family Medicine

## 2013-01-12 ENCOUNTER — Other Ambulatory Visit: Payer: Self-pay

## 2013-01-12 MED ORDER — GLUCOSE BLOOD VI STRP: ORAL_STRIP | Status: DC

## 2013-01-16 ENCOUNTER — Other Ambulatory Visit: Payer: Self-pay | Admitting: *Deleted

## 2013-01-16 MED ORDER — GLUCOSE BLOOD VI STRP: ORAL_STRIP | Status: DC

## 2013-04-02 ENCOUNTER — Ambulatory Visit (INDEPENDENT_AMBULATORY_CARE_PROVIDER_SITE_OTHER): Payer: Medicare Other | Admitting: Family Medicine

## 2013-04-02 ENCOUNTER — Ambulatory Visit (INDEPENDENT_AMBULATORY_CARE_PROVIDER_SITE_OTHER): Payer: Medicare Other

## 2013-04-02 ENCOUNTER — Encounter: Payer: Self-pay | Admitting: Family Medicine

## 2013-04-02 VITALS — BP 139/82 | HR 93 | Temp 98.0°F | Ht 70.0 in | Wt 178.0 lb

## 2013-04-02 DIAGNOSIS — E782 Mixed hyperlipidemia: Secondary | ICD-10-CM

## 2013-04-02 DIAGNOSIS — E119 Type 2 diabetes mellitus without complications: Secondary | ICD-10-CM

## 2013-04-02 DIAGNOSIS — K449 Diaphragmatic hernia without obstruction or gangrene: Secondary | ICD-10-CM

## 2013-04-02 DIAGNOSIS — E559 Vitamin D deficiency, unspecified: Secondary | ICD-10-CM | POA: Insufficient documentation

## 2013-04-02 DIAGNOSIS — Z23 Encounter for immunization: Secondary | ICD-10-CM

## 2013-04-02 LAB — POCT CBC
Hemoglobin: 15.5 g/dL (ref 14.1–18.1)
Lymph, poc: 1.5 (ref 0.6–3.4)
MCH, POC: 27.3 pg (ref 27–31.2)
MCHC: 32 g/dL (ref 31.8–35.4)
MPV: 8.4 fL (ref 0–99.8)
POC LYMPH PERCENT: 24.3 %L (ref 10–50)
Platelet Count, POC: 210 10*3/uL (ref 142–424)

## 2013-04-02 LAB — POCT GLYCOSYLATED HEMOGLOBIN (HGB A1C): Hemoglobin A1C: 7.4

## 2013-04-02 MED ORDER — LEVOTHYROXINE SODIUM 75 MCG PO TABS: 75.0000 ug | ORAL_TABLET | Freq: Every day | ORAL | Status: DC

## 2013-04-02 MED ORDER — QUINAPRIL HCL 20 MG PO TABS: 20.0000 mg | ORAL_TABLET | Freq: Every day | ORAL | Status: DC

## 2013-04-02 MED ORDER — INSULIN GLARGINE 100 UNIT/ML ~~LOC~~ SOLN: 48.0000 [IU] | SUBCUTANEOUS | Status: DC

## 2013-04-02 MED ORDER — METFORMIN HCL 1000 MG PO TABS: 1000.0000 mg | ORAL_TABLET | Freq: Two times a day (BID) | ORAL | Status: DC

## 2013-04-02 NOTE — Progress Notes (Signed)
Subjective:    Patient ID: [name] [name], male    DOB: November 09, 1940, 72 y.o.   MRN: [id number]  HPI Pt here for follow up and management of chronic medical problems. Patient comes in today for his routine exam and he has no particular complaints. His review of systems are negative and he is up to date on his health maintenance except getting a chest x-ray and getting a Prevnar vaccine shot today.        Patient Active Problem List   Diagnosis Date Noted  . Goiter, nontoxic, multinodular   . Inguinal hernia   . Kidney stone   . Hyperlipidemia 01/15/2009  . ABNORMAL ELECTROCARDIOGRAM 01/15/2009  . DM 01/09/2009  . HIATAL HERNIA 01/09/2009  . BASAL CELL CARCINOMA, HX OF 01/09/2009  . DIVERTICULITIS, HX OF 01/09/2009   Outpatient Encounter Prescriptions as of 04/02/2013  Medication Sig  . aspirin 81 MG tablet Take 81 mg by mouth daily.    . [facility] CINNAMON PO Take 2,000 mg by mouth daily.    . Garlic 1000 MG CAPS Take by mouth daily.    . insulin glargine (LANTUS) 100 UNIT/ML injection Inject 0.48 mLs (48 Units total) into the skin every morning.  Marland Kitchen levothyroxine (SYNTHROID, LEVOTHROID) 75 MCG tablet Take 1 tablet (75 mcg total) by mouth daily.  . metFORMIN (GLUCOPHAGE) 1000 MG tablet Take 1 tablet (1,000 mg total) by mouth 2 (two) times daily with a meal.  . quinapril (ACCUPRIL) 20 MG tablet Take 1 tablet (20 mg total) by mouth at bedtime.  . ranitidine (ZANTAC) 150 MG tablet Take 150 mg by mouth as needed.    . [DISCONTINUED] glucose blood test strip Use as instructed    Review of Systems  Constitutional: Negative.   HENT: Negative.   Eyes: Negative.   Respiratory: Negative.   Cardiovascular: Negative.   Gastrointestinal: Negative.   Endocrine: Negative.   Genitourinary: Negative.   Musculoskeletal: Negative.   Skin: Negative.   Allergic/Immunologic: Negative.   Neurological: Negative.   Hematological: Negative.   Psychiatric/Behavioral: Negative.        Objective:   Physical Exam  Nursing note and vitals reviewed. Constitutional: He is oriented to person, place, and time. He appears well-developed and well-nourished. No distress.  HENT:  Head: Normocephalic and atraumatic.  Right Ear: External ear normal.  Left Ear: External ear normal.  Nose: Nose normal.  Mouth/Throat: Oropharynx is clear and moist. No oropharyngeal exudate.  Some nasal congestion bilaterally  Eyes: Conjunctivae and EOM are normal. Pupils are equal, round, and reactive to light. Right eye exhibits no discharge. Left eye exhibits no discharge. No scleral icterus.  Neck: Normal range of motion. Neck supple. No tracheal deviation present. No thyromegaly present.  No carotid bruits  Cardiovascular: Normal rate, regular rhythm, normal heart sounds and intact distal pulses.  Exam reveals no gallop and no friction rub.   No murmur heard. At 72 per minute  Pulmonary/Chest: Effort normal and breath sounds normal. No respiratory distress. He has no wheezes. He has no rales. He exhibits no tenderness.  Axilla negative for lumps or no  Abdominal: Soft. Bowel sounds are normal. He exhibits no mass. There is no tenderness. There is no rebound and no guarding.  No inguinal nodes  Musculoskeletal: Normal range of motion. He exhibits no edema and no tenderness.  Lymphadenopathy:    He has no cervical adenopathy.  Neurological: He is alert and oriented to person, place, and time. He has normal reflexes. No cranial nerve  deficit.  Skin: Skin is warm and dry. No rash noted. No erythema. No pallor.  Psychiatric: He has a normal mood and affect. His behavior is normal. Judgment and thought content normal.   BP 139/82  Pulse 93  Temp(Src) 98 F (36.7 C) (Oral)  Ht 5\' 10"  (1.778 m)  Wt 178 lb (80.74 kg)  BMI 25.54 kg/m2  [facility] reading (PRIMARY) by  Dr. Tracie Harrier x-ray--no cardiopulmonary abnormality                                      Assessment & Plan:     1. DM - insulin glargine  (LANTUS) 100 UNIT/ML injection; Inject 0.48 mLs (48 Units total) into the skin every morning.  Dispense: 15 pen; Refill: 3 - POCT CBC - POCT glycosylated hemoglobin (Hb A1C) - BMP8+EGFR - DG Chest 2 View; Future  2. Hyperlipidemia - POCT CBC - Hepatic function panel - NMR, lipoprofile - DG Chest 2 View; Future  3. Type II or unspecified type diabetes mellitus without mention of complication, not stated as uncontrolled - insulin glargine (LANTUS) 100 UNIT/ML injection; Inject 0.48 mLs (48 Units total) into the skin every morning.  Dispense: 15 pen; Refill: 3 - POCT CBC - POCT glycosylated hemoglobin (Hb A1C) - BMP8+EGFR - DG Chest 2 View; Future  4. Vitamin D deficiency - Vit D  25 hydroxy (rtn osteoporosis monitoring)  5. HIATAL HERNIA -continue Zantac as needed  Meds ordered this encounter  Medications  . levothyroxine (SYNTHROID, LEVOTHROID) 75 MCG tablet    Sig: Take 1 tablet (75 mcg total) by mouth daily.    Dispense:  90 tablet    Refill:  3  . metFORMIN (GLUCOPHAGE) 1000 MG tablet    Sig: Take 1 tablet (1,000 mg total) by mouth 2 (two) times daily with a meal.    Dispense:  180 tablet    Refill:  3  . quinapril (ACCUPRIL) 20 MG tablet    Sig: Take 1 tablet (20 mg total) by mouth at bedtime.    Dispense:  90 tablet    Refill:  3  . insulin glargine (LANTUS) 100 UNIT/ML injection    Sig: Inject 0.48 mLs (48 Units total) into the skin every morning.    Dispense:  15 pen    Refill:  3   Patient Instructions  Continue current medications. Continue good therapeutic lifestyle changes which include good diet and exercise. Fall precautions discussed with patient. Schedule your flu vaccine if you haven't had it yet If you are over 45 years old - you may need Prevnar 13 or the adult Pneumonia vaccine. Keep the house cooler this winter. Use a cool mist humidifier Continue to monitor blood sugars and blood pressures as you have been doing at home. Continue exercise  regimen The Prevnar vaccine that you will receive today may make her are somewhat sore.    Nyra Capes MD

## 2013-04-02 NOTE — Addendum Note (Signed)
Addended by: Magdalene River on: 04/02/2013 10:06 AM   Modules accepted: Orders

## 2013-04-02 NOTE — Patient Instructions (Addendum)
Continue current medications. Continue good therapeutic lifestyle changes which include good diet and exercise. Fall precautions discussed with patient. Schedule your flu vaccine if you haven't had it yet If you are over 71 years old - you may need Prevnar 13 or the adult Pneumonia vaccine. Keep the house cooler this winter. Use a cool mist humidifier Continue to monitor blood sugars and blood pressures as you have been doing at home. Continue exercise regimen The Prevnar vaccine that you will receive today may make her are somewhat sore.

## 2013-04-04 LAB — HEPATIC FUNCTION PANEL
ALT: 21 IU/L (ref 0–44)
AST: 13 IU/L (ref 0–40)
Albumin: 4.7 g/dL (ref 3.5–4.8)
Alkaline Phosphatase: 61 IU/L (ref 39–117)
Total Bilirubin: 0.4 mg/dL (ref 0.0–1.2)
Total Protein: 6.8 g/dL (ref 6.0–8.5)

## 2013-04-04 LAB — BMP8+EGFR
BUN/Creatinine Ratio: 15 (ref 10–22)
BUN: 14 mg/dL (ref 8–27)
CO2: 23 mmol/L (ref 18–29)
Creatinine, Ser: 0.93 mg/dL (ref 0.76–1.27)
Sodium: 140 mmol/L (ref 134–144)

## 2013-04-04 LAB — NMR, LIPOPROFILE
Cholesterol: 122 mg/dL (ref <200)
HDL Cholesterol by NMR: 51 mg/dL (ref >=40)
LDL Size: 21 nm (ref >20.5)
LP-IR Score: 45 (ref <=45)
Small LDL Particle Number: 155 nmol/L (ref <=527)

## 2013-04-09 ENCOUNTER — Other Ambulatory Visit (INDEPENDENT_AMBULATORY_CARE_PROVIDER_SITE_OTHER): Payer: Medicare Other

## 2013-04-09 DIAGNOSIS — Z1212 Encounter for screening for malignant neoplasm of rectum: Secondary | ICD-10-CM

## 2013-04-09 NOTE — Progress Notes (Signed)
PT DROPPED OFF FOBT ONLY 

## 2013-04-25 ENCOUNTER — Telehealth: Payer: Self-pay | Admitting: Family Medicine

## 2013-04-25 NOTE — Telephone Encounter (Signed)
Message copied by Waverly Ferrari on Wed Apr 25, 2013  3:12 PM ------      Message from: Chipper Herb      Created: Mon Apr 02, 2013  1:13 PM       The CBC, including hemoglobin WBC and platelets is within normal limits.      The hemoglobin A1c is 7.4%------please continue aggressive therapeutic lifestyle changes and current medication,the previous hemoglobin A1c was 8.4% and this shows a great improvement but he must continue his aggressive therapeutic lifestyle changes!!!!!!!!! ------

## 2013-04-30 ENCOUNTER — Encounter: Payer: Self-pay | Admitting: *Deleted

## 2013-04-30 NOTE — Progress Notes (Signed)
Quick Note:  Copy of labs sent to patient ______ 

## 2013-05-07 ENCOUNTER — Encounter: Payer: Self-pay | Admitting: *Deleted

## 2013-08-07 ENCOUNTER — Ambulatory Visit (INDEPENDENT_AMBULATORY_CARE_PROVIDER_SITE_OTHER): Payer: Commercial Managed Care - HMO | Admitting: Family Medicine

## 2013-08-07 ENCOUNTER — Telehealth: Payer: Self-pay | Admitting: Pharmacist Clinician (PhC)/ Clinical Pharmacy Specialist

## 2013-08-07 ENCOUNTER — Encounter: Payer: Self-pay | Admitting: Family Medicine

## 2013-08-07 VITALS — BP 144/86 | HR 88 | Temp 97.1°F | Ht 70.0 in | Wt 179.0 lb

## 2013-08-07 DIAGNOSIS — E782 Mixed hyperlipidemia: Secondary | ICD-10-CM

## 2013-08-07 DIAGNOSIS — Z139 Encounter for screening, unspecified: Secondary | ICD-10-CM

## 2013-08-07 DIAGNOSIS — E559 Vitamin D deficiency, unspecified: Secondary | ICD-10-CM

## 2013-08-07 DIAGNOSIS — E039 Hypothyroidism, unspecified: Secondary | ICD-10-CM | POA: Insufficient documentation

## 2013-08-07 DIAGNOSIS — E119 Type 2 diabetes mellitus without complications: Secondary | ICD-10-CM

## 2013-08-07 LAB — POCT CBC
Granulocyte percent: 72.6 %G (ref 37–80)
HEMATOCRIT: 44.5 % (ref 43.5–53.7)
Hemoglobin: 14.4 g/dL (ref 14.1–18.1)
Lymph, poc: 1.6 (ref 0.6–3.4)
MCH: 27.3 pg (ref 27–31.2)
MCHC: 32.4 g/dL (ref 31.8–35.4)
MCV: 84.1 fL (ref 80–97)
MPV: 8.6 fL (ref 0–99.8)
POC GRANULOCYTE: 4.5 (ref 2–6.9)
POC LYMPH PERCENT: 26.1 %L (ref 10–50)
Platelet Count, POC: 200 10*3/uL (ref 142–424)
RBC: 5.3 M/uL (ref 4.69–6.13)
RDW, POC: 15.3 %
WBC: 6.2 10*3/uL (ref 4.6–10.2)

## 2013-08-07 LAB — POCT GLYCOSYLATED HEMOGLOBIN (HGB A1C): Hemoglobin A1C: 7.6

## 2013-08-07 NOTE — Telephone Encounter (Signed)
Patient will be in the donut hole for prescription medications in September.  He is taking Lantus and will need to be set up in the [place] patient assistance program at that time unless [employer] opens theirs back up.

## 2013-08-07 NOTE — Progress Notes (Signed)
Subjective:    Patient ID: [name] [name], male    DOB: 1941/03/18, 73 y.o.   MRN: [id number]  HPI Pt here for follow up and management of chronic medical problems. The patient is feeling well. He has been exercising regularly and this has helped him feel better. He is concerned about the cost of Lantus and we are currently looking into this for him. He is review of systems is negative        Patient Active Problem List   Diagnosis Date Noted  . Vitamin D deficiency 04/02/2013  . Goiter, nontoxic, multinodular   . Inguinal hernia   . Kidney stone   . Hyperlipidemia 01/15/2009  . ABNORMAL ELECTROCARDIOGRAM 01/15/2009  . DM 01/09/2009  . HIATAL HERNIA 01/09/2009  . BASAL CELL CARCINOMA, HX OF 01/09/2009  . DIVERTICULITIS, HX OF 01/09/2009   Outpatient Encounter Prescriptions as of 08/07/2013  Medication Sig  . aspirin 81 MG tablet Take 81 mg by mouth daily.    . cholecalciferol (VITAMIN D) 1000 UNITS tablet Take 1,000 Units by mouth daily. 5000 twice a week  . [facility] CINNAMON PO Take 2,000 mg by mouth daily.    . Garlic 0370 MG CAPS Take by mouth daily.    . insulin glargine (LANTUS) 100 UNIT/ML injection Inject 0.48 mLs (48 Units total) into the skin every morning.  Marland Kitchen levothyroxine (SYNTHROID, LEVOTHROID) 75 MCG tablet Take 1 tablet (75 mcg total) by mouth daily.  . metFORMIN (GLUCOPHAGE) 1000 MG tablet Take 1 tablet (1,000 mg total) by mouth 2 (two) times daily with a meal.  . quinapril (ACCUPRIL) 20 MG tablet Take 1 tablet (20 mg total) by mouth at bedtime.  . ranitidine (ZANTAC) 150 MG tablet Take 150 mg by mouth as needed.      Review of Systems  Constitutional: Negative.   HENT: Negative.   Eyes: Negative.   Respiratory: Negative.   Cardiovascular: Negative.   Gastrointestinal: Negative.   Endocrine: Negative.   Genitourinary: Negative.   Musculoskeletal: Negative.   Skin: Negative.   Allergic/Immunologic: Negative.   Neurological: Negative.   Hematological:  Negative.   Psychiatric/Behavioral: Negative.        Objective:   Physical Exam  Nursing note and vitals reviewed. Constitutional: He is oriented to person, place, and time. He appears well-developed and well-nourished. No distress.  HENT:  Head: Normocephalic and atraumatic.  Right Ear: External ear normal.  Left Ear: External ear normal.  Nose: Nose normal.  Mouth/Throat: Oropharynx is clear and moist. No oropharyngeal exudate.  Eyes: Conjunctivae and EOM are normal. Pupils are equal, round, and reactive to light. Right eye exhibits no discharge. Left eye exhibits no discharge. No scleral icterus.  Neck: Normal range of motion. Neck supple. No thyromegaly present.  No carotid bruits  Cardiovascular: Normal rate, regular rhythm, normal heart sounds and intact distal pulses.  Exam reveals no gallop and no friction rub.   No murmur heard. At 72 per minute  Pulmonary/Chest: Effort normal and breath sounds normal. No respiratory distress. He has no wheezes. He has no rales.  No axillary adenopathy  Abdominal: Soft. Bowel sounds are normal. He exhibits no mass. There is no tenderness. There is no guarding.  No inguinal adenopathy  Musculoskeletal: Normal range of motion. He exhibits no edema and no tenderness.  Lymphadenopathy:    He has no cervical adenopathy.  Neurological: He is alert and oriented to person, place, and time. He has normal reflexes. No cranial nerve deficit.  Skin: Skin is  warm and dry. No rash noted. No erythema. No pallor.  Psychiatric: He has a normal mood and affect. His behavior is normal. Judgment and thought content normal.   BP 144/86  Pulse 88  Temp(Src) 97.1 F (36.2 C) (Oral)  Ht '5\' 10"'  (1.778 m)  Wt 179 lb (81.194 kg)  BMI 25.68 kg/m2        Assessment & Plan:    1. DM - Ambulatory referral to Ophthalmology - POCT CBC - POCT glycosylated hemoglobin (Hb A1C)  2. Vitamin D deficiency - POCT CBC - Vit D  25 hydroxy (rtn osteoporosis  monitoring)  3. Hyperlipidemia - POCT CBC - BMP8+EGFR - Hepatic function panel - NMR, lipoprofile  4. Screening - Ambulatory referral to Ophthalmology - POCT CBC  5. Hypothyroidism  Patient Instructions                       Medicare Annual Wellness Visit  [facility] and the medical providers at [facility] strive to bring you the best medical care.  In doing so we not only want to address your current medical conditions and concerns but also to detect new conditions early and prevent illness, disease and health-related problems.    Medicare offers a yearly Wellness Visit which allows our clinical staff to assess your need for preventative services including immunizations, lifestyle education, counseling to decrease risk of preventable diseases and screening for fall risk and other medical concerns.    This visit is provided free of charge (no copay) for all Medicare recipients. The clinical pharmacists at [facility] have begun to conduct these Wellness Visits which will also include a thorough review of all your medications.    As you primary medical provider recommend that you make an appointment for your Annual Wellness Visit if you have not done so already this year.  You may set up this appointment before you leave today or you may call back ([phone]) and schedule an appointment.  Please make sure when you call that you mention that you are scheduling your Annual Wellness Visit with the clinical pharmacist so that the appointment may be made for the proper length of time.      Continue current medications. Continue good therapeutic lifestyle changes which include good diet and exercise. Fall precautions discussed with patient. If an FOBT was given today- please return it to our front desk. If you are over 20 years old - you may need Prevnar 33 or the adult Pneumonia vaccine.  Keep appointment for eye exam on April 30 Be sure and  check with clinical pharmacist around the end of July or August 1 regarding your insulin requirements and needs Continue regular exercise Drink plenty of water   Arrie Senate MD

## 2013-08-07 NOTE — Patient Instructions (Addendum)
Medicare Annual Wellness Visit  [facility] and the medical providers at [facility] strive to bring you the best medical care.  In doing so we not only want to address your current medical conditions and concerns but also to detect new conditions early and prevent illness, disease and health-related problems.    Medicare offers a yearly Wellness Visit which allows our clinical staff to assess your need for preventative services including immunizations, lifestyle education, counseling to decrease risk of preventable diseases and screening for fall risk and other medical concerns.    This visit is provided free of charge (no copay) for all Medicare recipients. The clinical pharmacists at [facility] have begun to conduct these Wellness Visits which will also include a thorough review of all your medications.    As you primary medical provider recommend that you make an appointment for your Annual Wellness Visit if you have not done so already this year.  You may set up this appointment before you leave today or you may call back ([phone]) and schedule an appointment.  Please make sure when you call that you mention that you are scheduling your Annual Wellness Visit with the clinical pharmacist so that the appointment may be made for the proper length of time.      Continue current medications. Continue good therapeutic lifestyle changes which include good diet and exercise. Fall precautions discussed with patient. If an FOBT was given today- please return it to our front desk. If you are over 38 years old - you may need Prevnar 68 or the adult Pneumonia vaccine.  Keep appointment for eye exam on April 30 Be sure and check with clinical pharmacist around the end of July or August 1 regarding your insulin requirements and needs Continue regular exercise Drink plenty of water

## 2013-08-08 LAB — BMP8+EGFR
BUN / CREAT RATIO: 11 (ref 10–22)
BUN: 10 mg/dL (ref 8–27)
CO2: 25 mmol/L (ref 18–29)
Calcium: 9.9 mg/dL (ref 8.6–10.2)
Chloride: 103 mmol/L (ref 97–108)
Creatinine, Ser: 0.9 mg/dL (ref 0.76–1.27)
GFR calc non Af Amer: 85 mL/min/{1.73_m2} (ref >59)
GFR, EST AFRICAN AMERICAN: 98 mL/min/{1.73_m2} (ref >59)
Glucose: 105 mg/dL — ABNORMAL HIGH (ref 65–99)
POTASSIUM: 4.2 mmol/L (ref 3.5–5.2)
SODIUM: 142 mmol/L (ref 134–144)

## 2013-08-08 LAB — NMR, LIPOPROFILE
Cholesterol: 113 mg/dL (ref <200)
HDL CHOLESTEROL BY NMR: 49 mg/dL (ref >=40)
HDL Particle Number: 34.7 umol/L (ref >=30.5)
LDL PARTICLE NUMBER: 556 nmol/L (ref <1000)
LDL Size: 21.1 nm (ref >20.5)
LDLC SERPL CALC-MCNC: 55 mg/dL (ref <100)
LP-IR Score: 43 (ref <=45)
SMALL LDL PARTICLE NUMBER: 139 nmol/L (ref <=527)
TRIGLYCERIDES BY NMR: 43 mg/dL (ref <150)

## 2013-08-08 LAB — HEPATIC FUNCTION PANEL
ALK PHOS: 69 IU/L (ref 39–117)
ALT: 22 IU/L (ref 0–44)
AST: 16 IU/L (ref 0–40)
Albumin: 4.5 g/dL (ref 3.5–4.8)
BILIRUBIN DIRECT: 0.08 mg/dL (ref 0.00–0.40)
Total Bilirubin: 0.2 mg/dL (ref 0.0–1.2)
Total Protein: 6.6 g/dL (ref 6.0–8.5)

## 2013-08-08 LAB — VITAMIN D 25 HYDROXY (VIT D DEFICIENCY, FRACTURES): Vit D, 25-Hydroxy: 37.8 ng/mL (ref 30.0–100.0)

## 2013-10-30 ENCOUNTER — Ambulatory Visit (INDEPENDENT_AMBULATORY_CARE_PROVIDER_SITE_OTHER): Payer: Commercial Managed Care - HMO | Admitting: Pharmacist

## 2013-10-30 ENCOUNTER — Encounter: Payer: Self-pay | Admitting: Pharmacist

## 2013-10-30 VITALS — BP 138/76 | HR 77 | Ht 70.0 in | Wt 180.0 lb

## 2013-10-30 DIAGNOSIS — Z0000 Encounter for general adult medical examination without abnormal findings: Secondary | ICD-10-CM

## 2013-10-30 MED ORDER — DICLOFENAC SODIUM 1 % TD GEL: TRANSDERMAL | Status: DC

## 2013-10-30 MED ORDER — INSULIN DETEMIR 100 UNIT/ML FLEXPEN: PEN_INJECTOR | SUBCUTANEOUS | Status: DC

## 2013-10-30 NOTE — Patient Instructions (Signed)
Health Maintenance Summary    URINE MICROALBUMIN Next Due 10/16/2013  last was 09/2012 - normal    INFLUENZA VACCINE Next Due 11/17/2013  last was 06/27/2013    HEMOGLOBIN A1C Next Due 02/06/2014  last was 7.6% 07/2013    OPHTHALMOLOGY EXAM Next Due 02/07/2014  last was 07/2013    COLON CANCER SCREENING ANNUAL FOBT Next Due 03/19/2014      FOOT EXAM Next Due 08/08/2014     Shingles vaccine Completed 10/18/2010    Pneumonia Vaccine Completed  03/2013     TETANUS/TDAP Next Due 12/18/2016  last was 12/19/2006    COLONOSCOPY Next Due 11/26/2020  last was 11/27/2010        Sent referral to [facility] Patient Assistance for medication help - You should receive a call from [place] in next 2-3 weeks.   Preventive Care for Adults A healthy lifestyle and preventive care can promote health and wellness. Preventive health guidelines for men include the following key practices:  A routine yearly physical is a good way to check with your health care provider about your health and preventative screening. It is a chance to share any concerns and updates on your health and to receive a thorough exam.  Visit your dentist for a routine exam and preventative care every 6 months. Brush your teeth twice a day and floss once a day. Good oral hygiene prevents tooth decay and gum disease.  The frequency of eye exams is based on your age, health, family medical history, use of contact lenses, and other factors. Follow your health care provider's recommendations for frequency of eye exams.  Eat a healthy diet. Foods such as vegetables, fruits, whole grains, low-fat dairy products, and lean protein foods contain the nutrients you need without too many calories. Decrease your intake of foods high in solid fats, added sugars, and salt. Eat the right amount of calories for you.Get information about a proper diet from your health care provider, if necessary.  Regular physical exercise is one of the most important  things you can do for your health. Most adults should get at least 150 minutes of moderate-intensity exercise (any activity that increases your heart rate and causes you to sweat) each week. In addition, most adults need muscle-strengthening exercises on 2 or more days a week.  Maintain a healthy weight. The body mass index (BMI) is a screening tool to identify possible weight problems. It provides an estimate of body fat based on height and weight. Your health care provider can find your BMI and can help you achieve or maintain a healthy weight.For adults 20 years and older:  A BMI below 18.5 is considered underweight.  A BMI of 18.5 to 24.9 is normal.  A BMI of 25 to 29.9 is considered overweight.  A BMI of 30 and above is considered obese.  Maintain normal blood lipids and cholesterol levels by exercising and minimizing your intake of saturated fat. Eat a balanced diet with plenty of fruit and vegetables. Blood tests for lipids and cholesterol should begin at age 46 and be repeated every 5 years. If your lipid or cholesterol levels are high, you are over 50, or you are at high risk for heart disease, you may need your cholesterol levels checked more frequently.Ongoing high lipid and cholesterol levels should be treated with medicines if diet and exercise are not working.  If you smoke, find out from your health care provider how to quit. If you do not use tobacco, do  not start.  Lung cancer screening is recommended for adults aged 13-80 years who are at high risk for developing lung cancer because of a history of smoking. A yearly low-dose CT scan of the lungs is recommended for people who have at least a 30-pack-year history of smoking and are a current smoker or have quit within the past 15 years. A pack year of smoking is smoking an average of 1 pack of cigarettes a day for 1 year (for example: 1 pack a day for 30 years or 2 packs a day for 15 years). Yearly screening should continue until  the smoker has stopped smoking for at least 15 years. Yearly screening should be stopped for people who develop a health problem that would prevent them from having lung cancer treatment.  If you choose to drink alcohol, do not have more than 2 drinks per day. One drink is considered to be 12 ounces (355 mL) of beer, 5 ounces (148 mL) of wine, or 1.5 ounces (44 mL) of liquor.  Avoid use of street drugs. Do not share needles with anyone. Ask for help if you need support or instructions about stopping the use of drugs.  High blood pressure causes heart disease and increases the risk of stroke. Your blood pressure should be checked at least every 1-2 years. Ongoing high blood pressure should be treated with medicines, if weight loss and exercise are not effective.  If you are 73-23 years old, ask your health care provider if you should take aspirin to prevent heart disease.  Diabetes screening involves taking a blood sample to check your fasting blood sugar level. This should be done once every 3 years, after age 32, if you are within normal weight and without risk factors for diabetes. Testing should be considered at a younger age or be carried out more frequently if you are overweight and have at least 1 risk factor for diabetes.  Colorectal cancer can be detected and often prevented. Most routine colorectal cancer screening begins at the age of 45 and continues through age 73. However, your health care provider may recommend screening at an earlier age if you have risk factors for colon cancer. On a yearly basis, your health care provider may provide home test kits to check for hidden blood in the stool. Use of a small camera at the end of a tube to directly examine the colon (sigmoidoscopy or colonoscopy) can detect the earliest forms of colorectal cancer. Talk to your health care provider about this at age 68, when routine screening begins. Direct exam of the colon should be repeated every 5-10 years  through age 73, unless early forms of precancerous polyps or small growths are found.  People who are at an increased risk for hepatitis B should be screened for this virus. You are considered at high risk for hepatitis B if:  You were born in a country where hepatitis B occurs often. Talk with your health care provider about which countries are considered high risk.  Your parents were born in a high-risk country and you have not received a shot to protect against hepatitis B (hepatitis B vaccine).  You have HIV or AIDS.  You use needles to inject street drugs.  You live with, or have sex with, someone who has hepatitis B.  You are a man who has sex with other men (MSM).  You get hemodialysis treatment.  You take certain medicines for conditions such as cancer, organ transplantation, and autoimmune conditions.  Hepatitis C blood testing is recommended for all people born from [birth year] through 1965 and any individual with known risks for hepatitis C.  Practice safe sex. Use condoms and avoid high-risk sexual practices to reduce the spread of sexually transmitted infections (STIs). STIs include gonorrhea, chlamydia, syphilis, trichomonas, herpes, HPV, and human immunodeficiency virus (HIV). Herpes, HIV, and HPV are viral illnesses that have no cure. They can result in disability, cancer, and death.  If you are at risk of being infected with HIV, it is recommended that you take a prescription medicine daily to prevent HIV infection. This is called preexposure prophylaxis (PrEP). You are considered at risk if:  You are a man who has sex with other men (MSM) and have other risk factors.  You are a heterosexual man, are sexually active, and are at increased risk for HIV infection.  You take drugs by injection.  You are sexually active with a partner who has HIV.  Talk with your health care provider about whether you are at high risk of being infected with HIV. If you choose to begin PrEP, you  should first be tested for HIV. You should then be tested every 3 months for as long as you are taking PrEP.  A one-time screening for abdominal aortic aneurysm (AAA) and surgical repair of large AAAs by ultrasound are recommended for men ages 48 to 17 years who are current or former smokers.  Healthy men should no longer receive prostate-specific antigen (PSA) blood tests as part of routine cancer screening. Talk with your health care provider about prostate cancer screening.  Testicular cancer screening is not recommended for adult males who have no symptoms. Screening includes self-exam, a health care provider exam, and other screening tests. Consult with your health care provider about any symptoms you have or any concerns you have about testicular cancer.  Use sunscreen. Apply sunscreen liberally and repeatedly throughout the day. You should seek shade when your shadow is shorter than you. Protect yourself by wearing long sleeves, pants, a wide-brimmed hat, and sunglasses year round, whenever you are outdoors.  Once a month, do a whole-body skin exam, using a mirror to look at the skin on your back. Tell your health care provider about new moles, moles that have irregular borders, moles that are larger than a pencil eraser, or moles that have changed in shape or color.  Stay current with required vaccines (immunizations).  Influenza vaccine. All adults should be immunized every year.  Tetanus, diphtheria, and acellular pertussis (Td, Tdap) vaccine. An adult who has not previously received Tdap or who does not know his vaccine status should receive 1 dose of Tdap. This initial dose should be followed by tetanus and diphtheria toxoids (Td) booster doses every 10 years. Adults with an unknown or incomplete history of completing a 3-dose immunization series with Td-containing vaccines should begin or complete a primary immunization series including a Tdap dose. Adults should receive a Td booster  every 10 years.  Varicella vaccine. An adult without evidence of immunity to varicella should receive 2 doses or a second dose if he has previously received 1 dose.  Human papillomavirus (HPV) vaccine. Males aged 40-21 years who have not received the vaccine previously should receive the 3-dose series. Males aged 22-26 years may be immunized. Immunization is recommended through the age of 75 years for any male who has sex with males and did not get any or all doses earlier. Immunization is recommended for any person with an immunocompromised condition  through the age of 87 years if he did not get any or all doses earlier. During the 3-dose series, the second dose should be obtained 4-8 weeks after the first dose. The third dose should be obtained 24 weeks after the first dose and 16 weeks after the second dose.  Zoster vaccine. One dose is recommended for adults aged 36 years or older unless certain conditions are present.  Measles, mumps, and rubella (MMR) vaccine. Adults born before [birth year] generally are considered immune to measles and mumps. Adults born in [birth year] or later should have 1 or more doses of MMR vaccine unless there is a contraindication to the vaccine or there is laboratory evidence of immunity to each of the three diseases. A routine second dose of MMR vaccine should be obtained at least 28 days after the first dose for students attending postsecondary schools, health care workers, or international travelers. People who received inactivated measles vaccine or an unknown type of measles vaccine during 1963-1967 should receive 2 doses of MMR vaccine. People who received inactivated mumps vaccine or an unknown type of mumps vaccine before 1979 and are at high risk for mumps infection should consider immunization with 2 doses of MMR vaccine. Unvaccinated health care workers born before [birth year] who lack laboratory evidence of measles, mumps, or rubella immunity or laboratory confirmation of disease  should consider measles and mumps immunization with 2 doses of MMR vaccine or rubella immunization with 1 dose of MMR vaccine.  Pneumococcal 13-valent conjugate (PCV13) vaccine. When indicated, a person who is uncertain of his immunization history and has no record of immunization should receive the PCV13 vaccine. An adult aged 42 years or older who has certain medical conditions and has not been previously immunized should receive 1 dose of PCV13 vaccine. This PCV13 should be followed with a dose of pneumococcal polysaccharide (PPSV23) vaccine. The PPSV23 vaccine dose should be obtained at least 8 weeks after the dose of PCV13 vaccine. An adult aged 32 years or older who has certain medical conditions and previously received 1 or more doses of PPSV23 vaccine should receive 1 dose of PCV13. The PCV13 vaccine dose should be obtained 1 or more years after the last PPSV23 vaccine dose.  Pneumococcal polysaccharide (PPSV23) vaccine. When PCV13 is also indicated, PCV13 should be obtained first. All adults aged 4 years and older should be immunized. An adult younger than age 96 years who has certain medical conditions should be immunized. Any person who resides in a nursing home or long-term care facility should be immunized. An adult smoker should be immunized. People with an immunocompromised condition and certain other conditions should receive both PCV13 and PPSV23 vaccines. People with human immunodeficiency virus (HIV) infection should be immunized as soon as possible after diagnosis. Immunization during chemotherapy or radiation therapy should be avoided. Routine use of PPSV23 vaccine is not recommended for American Indians, [place] Natives, or people younger than 65 years unless there are medical conditions that require PPSV23 vaccine. When indicated, people who have unknown immunization and have no record of immunization should receive PPSV23 vaccine. One-time revaccination 5 years after the first dose of  PPSV23 is recommended for people aged 19-64 years who have chronic kidney failure, nephrotic syndrome, asplenia, or immunocompromised conditions. People who received 1-2 doses of PPSV23 before age 33 years should receive another dose of PPSV23 vaccine at age 35 years or later if at least 5 years have passed since the previous dose. Doses of PPSV23 are not needed for people immunized  with PPSV23 at or after age 20 years.  Meningococcal vaccine. Adults with asplenia or persistent complement component deficiencies should receive 2 doses of quadrivalent meningococcal conjugate (MenACWY-D) vaccine. The doses should be obtained at least 2 months apart. Microbiologists working with certain meningococcal bacteria, [place] recruits, people at risk during an outbreak, and people who travel to or live in countries with a high rate of meningitis should be immunized. A first-year college student up through age 41 years who is living in a residence hall should receive a dose if he did not receive a dose on or after his 16th birthday. Adults who have certain high-risk conditions should receive one or more doses of vaccine.  Hepatitis A vaccine. Adults who wish to be protected from this disease, have certain high-risk conditions, work with hepatitis A-infected animals, work in hepatitis A research labs, or travel to or work in countries with a high rate of hepatitis A should be immunized. Adults who were previously unvaccinated and who anticipate close contact with an international adoptee during the first 60 days after arrival in the [place] from a country with a high rate of hepatitis A should be immunized.  Hepatitis B vaccine. Adults should be immunized if they wish to be protected from this disease, have certain high-risk conditions, may be exposed to blood or other infectious body fluids, are household contacts or sex partners of hepatitis B positive people, are clients or workers in certain care facilities, or  travel to or work in countries with a high rate of hepatitis B.

## 2013-10-30 NOTE — Progress Notes (Signed)
Subjective:    [name] [name] is a 73 y.o. male who presents for Medicare Initial Wellness visit.Marland Kitchen He also is here because he will be in Medicare coverage gap startingin August - needs referral to [facility] Patient Assistance program  Preventive Screening-Counseling & Management  Tobacco History  Smoking status  . Former Smoker -- 1.00 packs/day for 27 years  . Types: Cigarettes  . Quit date: 04/20/1983  Smokeless tobacco  . Never Used    Comment: Quit 25 years ago    Current Problems (verified) Patient Active Problem List   Diagnosis Date Noted  . Hypothyroidism 08/07/2013  . Vitamin D deficiency 04/02/2013  . Goiter, nontoxic, multinodular   . Inguinal hernia   . Kidney stone   . Hyperlipidemia 01/15/2009  . ABNORMAL ELECTROCARDIOGRAM 01/15/2009  . DM 01/09/2009  . HIATAL HERNIA 01/09/2009  . BASAL CELL CARCINOMA, HX OF 01/09/2009  . DIVERTICULITIS, HX OF 01/09/2009    Medications Prior to Visit Current Outpatient Prescriptions on File Prior to Visit  Medication Sig Dispense Refill  . aspirin 81 MG tablet Take 81 mg by mouth daily.        . cholecalciferol (VITAMIN D) 1000 UNITS tablet Take 1,000 Units by mouth daily. 5000 twice a week      . [facility] CINNAMON PO Take 2,000 mg by mouth daily.        . Garlic 3335 MG CAPS Take by mouth daily.        Marland Kitchen levothyroxine (SYNTHROID, LEVOTHROID) 75 MCG tablet Take 1 tablet (75 mcg total) by mouth daily.  90 tablet  3  . metFORMIN (GLUCOPHAGE) 1000 MG tablet Take 1 tablet (1,000 mg total) by mouth 2 (two) times daily with a meal.  180 tablet  3  . quinapril (ACCUPRIL) 20 MG tablet Take 1 tablet (20 mg total) by mouth at bedtime.  90 tablet  3  . ranitidine (ZANTAC) 150 MG tablet Take 150 mg by mouth as needed.         No current facility-administered medications on file prior to visit.    Current Medications (verified) Current Outpatient Prescriptions  Medication Sig Dispense Refill  . aspirin 81 MG tablet Take 81 mg by mouth  daily.        . cholecalciferol (VITAMIN D) 1000 UNITS tablet Take 1,000 Units by mouth daily. 5000 twice a week      . CVS CINNAMON PO Take 2,000 mg by mouth daily.        . Garlic 4562 MG CAPS Take by mouth daily.        Marland Kitchen levothyroxine (SYNTHROID, LEVOTHROID) 75 MCG tablet Take 1 tablet (75 mcg total) by mouth daily.  90 tablet  3  . metFORMIN (GLUCOPHAGE) 1000 MG tablet Take 1 tablet (1,000 mg total) by mouth 2 (two) times daily with a meal.  180 tablet  3  . quinapril (ACCUPRIL) 20 MG tablet Take 1 tablet (20 mg total) by mouth at bedtime.  90 tablet  3  . ranitidine (ZANTAC) 150 MG tablet Take 150 mg by mouth as needed.         No current facility-administered medications for this visit.     Allergies (verified) Actos; Lisinopril; Invokana; Niaspan; and Ramipril   PAST HISTORY  Family History Family History  Problem Relation Age of Onset  . Heart attack Sister     17s  . Heart disease Sister   . Heart disease Brother     Valvular  . Diabetes Other   .  Heart disease Mother     stent  . Cancer Father     ?lung  . Early death Brother 3    spinal menigitis    Social History History  Substance Use Topics  . Smoking status: Former Smoker -- 1.00 packs/day for 27 years    Types: Cigarettes    Quit date: 04/20/1983  . Smokeless tobacco: Never Used     Comment: Quit 25 years ago  . Alcohol Use: No    Are there smokers in your home (other than you)?  No  Risk Factors Current exercise habits: Gym/ health club routine includes light weights, treadmill and ellipitcal .  Dietary issues discussed: limiting CHO/salt and fat   Cardiac risk factors: advanced age (older than 49 for men, 22 for women), diabetes mellitus, dyslipidemia, hypertension and male gender.  Depression Screen (Note: if answer to either of the following is "Yes", a more complete depression screening is indicated)   Q1: Over the past two weeks, have you felt down, depressed or hopeless? No  Q2: Over the  past two weeks, have you felt little interest or pleasure in doing things? No  Have you lost interest or pleasure in daily life? No  Do you often feel hopeless? No  Do you cry easily over simple problems? No  Activities of Daily Living In your present state of health, do you have any difficulty performing the following activities?:  Driving? No Managing money?  No Feeding yourself? No Getting from bed to chair? No Climbing a flight of stairs? No Preparing food and eating?: No Bathing or showering? No Getting dressed: No Getting to the toilet? No Using the toilet:No Moving around from place to place: No In the past year have you fallen or had a near fall?:No   Are you sexually active?  Yes  Do you have more than one partner?  No  Hearing Difficulties: No Do you often ask people to speak up or repeat themselves? No Do you experience ringing or noises in your ears? No Do you have difficulty understanding soft or whispered voices? No   Do you feel that you have a problem with memory? No  Do you often misplace items? No  Do you feel safe at home?  Yes  Cognitive Testing  Alert? Yes  Normal Appearance?Yes  Oriented to person? Yes  Place? Yes   Time? Yes  Recall of three objects?  Yes  Can perform simple calculations? Yes  Displays appropriate judgment?Yes  Can read the correct time from a watch face?Yes   Advanced Directives have been discussed with the patient? Yes   List the Names of Other Physician/Practitioners you currently use: 1.  [facility] - opthalmology 2.  [facility] - Cardiologist 3.  Deatra Ina - GI  Indicate any recent Medical Services you may have received from other than [facility] providers in the past year (date may be approximate).  Immunization History  Administered Date(s) Administered  . Influenza Whole 01/17/2010  . Influenza-Unspecified 02/17/2013  . Pneumococcal Conjugate-13 04/02/2013  . Pneumococcal Polysaccharide-23 02/18/2008  . Td 12/19/2006     Screening Tests Health Maintenance  Topic Date Due  . Urine Microalbumin  10/16/2013  . Influenza Vaccine  11/17/2013  . Hemoglobin A1c  02/06/2014  . Ophthalmology Exam  02/07/2014  . Colon Cancer Screening Annual Fobt  03/19/2014  . Foot Exam  08/08/2014  . Tetanus/tdap  12/18/2016  . Colonoscopy  11/26/2020  . Pneumococcal Polysaccharide Vaccine Age 73 And Over  Completed  .  Zostavax  Completed    All answers were reviewed with the patient and necessary referrals were made:  Cherre Robins, [facility]   10/30/2013   History reviewed: allergies, current medications, past family history, past medical history, past social history, past surgical history and problem list   Objective:   Blood pressure 138/76, pulse 77, height 5\' 10"  (1.778 m), weight 180 lb (81.647 kg). Body mass index is 25.83 kg/(m^2).   Assessment:     Initial Annual Medicare Wellness Visit Medication Management Pain in left hand in area of prior surgery (about 10 year ago)      Plan:     During the course of the visit the patient was educated and counseled about appropriate screening and preventive services including:    Pneumococcal vaccine   Influenza vaccine  Hepatitis B vaccine  Td vaccine  Screening electrocardiogram  Prostate cancer screening  Colorectal cancer screening  Glaucoma screening  Nutrition counseling   Advanced directives: caring connections packet given  Referral sent to [facility] Patient Assistance Program  Change Lantus to Levemir  Rx for Volteran Gel - appt to area of pain on hand up to qid   Patient Instructions (the written plan) was given to the patient.  Medicare Attestation I have personally reviewed: The patient's medical and social history Their use of alcohol, tobacco or illicit drugs Their current medications and supplements The patient's functional ability including ADLs,fall risks, home safety risks, cognitive, and hearing and visual  impairment Diet and physical activities Evidence for depression or mood disorders  The patient's weight, height, BMI, and visual acuity have been recorded in the chart.  I have made referrals, counseling, and provided education to the patient based on review of the above and I have provided the patient with a written personalized care plan for preventive services.     Cherre Robins, [facility]   10/30/2013

## 2013-12-13 ENCOUNTER — Encounter: Payer: Self-pay | Admitting: Family Medicine

## 2013-12-13 ENCOUNTER — Ambulatory Visit (INDEPENDENT_AMBULATORY_CARE_PROVIDER_SITE_OTHER): Payer: Commercial Managed Care - HMO

## 2013-12-13 ENCOUNTER — Telehealth: Payer: Self-pay

## 2013-12-13 ENCOUNTER — Ambulatory Visit (INDEPENDENT_AMBULATORY_CARE_PROVIDER_SITE_OTHER): Payer: Commercial Managed Care - HMO | Admitting: Family Medicine

## 2013-12-13 VITALS — BP 140/86 | HR 65 | Temp 97.4°F | Ht 70.0 in | Wt 177.0 lb

## 2013-12-13 DIAGNOSIS — M79642 Pain in left hand: Secondary | ICD-10-CM

## 2013-12-13 DIAGNOSIS — N390 Urinary tract infection, site not specified: Secondary | ICD-10-CM

## 2013-12-13 DIAGNOSIS — M79609 Pain in unspecified limb: Secondary | ICD-10-CM

## 2013-12-13 DIAGNOSIS — E039 Hypothyroidism, unspecified: Secondary | ICD-10-CM

## 2013-12-13 DIAGNOSIS — E119 Type 2 diabetes mellitus without complications: Secondary | ICD-10-CM

## 2013-12-13 DIAGNOSIS — E559 Vitamin D deficiency, unspecified: Secondary | ICD-10-CM

## 2013-12-13 DIAGNOSIS — I10 Essential (primary) hypertension: Secondary | ICD-10-CM | POA: Insufficient documentation

## 2013-12-13 DIAGNOSIS — N400 Enlarged prostate without lower urinary tract symptoms: Secondary | ICD-10-CM | POA: Insufficient documentation

## 2013-12-13 DIAGNOSIS — K4090 Unilateral inguinal hernia, without obstruction or gangrene, not specified as recurrent: Secondary | ICD-10-CM | POA: Insufficient documentation

## 2013-12-13 DIAGNOSIS — E782 Mixed hyperlipidemia: Secondary | ICD-10-CM

## 2013-12-13 LAB — POCT URINALYSIS DIPSTICK
BILIRUBIN UA: NEGATIVE
Glucose, UA: NEGATIVE
KETONES UA: NEGATIVE
Nitrite, UA: NEGATIVE
PH UA: 7
PROTEIN UA: NEGATIVE
RBC UA: NEGATIVE
Spec Grav, UA: 1.01
Urobilinogen, UA: NEGATIVE

## 2013-12-13 LAB — POCT CBC
Granulocyte percent: 76.9 %G (ref 37–80)
HCT, POC: 46.1 % (ref 43.5–53.7)
HEMOGLOBIN: 15 g/dL (ref 14.1–18.1)
LYMPH, POC: 1.3 (ref 0.6–3.4)
MCH, POC: 27.6 pg (ref 27–31.2)
MCHC: 32.5 g/dL (ref 31.8–35.4)
MCV: 84.9 fL (ref 80–97)
MPV: 8.6 fL (ref 0–99.8)
PLATELET COUNT, POC: 191 10*3/uL (ref 142–424)
POC Granulocyte: 5 (ref 2–6.9)
POC LYMPH PERCENT: 20.1 %L (ref 10–50)
RBC: 5.4 M/uL (ref 4.69–6.13)
RDW, POC: 14.3 %
WBC: 6.5 10*3/uL (ref 4.6–10.2)

## 2013-12-13 LAB — POCT UA - MICROSCOPIC ONLY
Bacteria, U Microscopic: NEGATIVE
CRYSTALS, UR, HPF, POC: NEGATIVE
Casts, Ur, LPF, POC: NEGATIVE
MUCUS UA: NEGATIVE
RBC, URINE, MICROSCOPIC: NEGATIVE
Yeast, UA: NEGATIVE

## 2013-12-13 LAB — POCT UA - MICROALBUMIN: Microalbumin Ur, POC: NEGATIVE mg/L

## 2013-12-13 LAB — POCT GLYCOSYLATED HEMOGLOBIN (HGB A1C): Hemoglobin A1C: 7.8

## 2013-12-13 MED ORDER — MELOXICAM 15 MG PO TABS: 15.0000 mg | ORAL_TABLET | Freq: Every day | ORAL | Status: DC

## 2013-12-13 MED ORDER — INSULIN DETEMIR 100 UNIT/ML FLEXPEN: PEN_INJECTOR | SUBCUTANEOUS | Status: DC

## 2013-12-13 NOTE — Patient Instructions (Addendum)
Medicare Annual Wellness Visit  [facility] and the medical providers at [facility] strive to bring you the best medical care.  In doing so we not only want to address your current medical conditions and concerns but also to detect new conditions early and prevent illness, disease and health-related problems.    Medicare offers a yearly Wellness Visit which allows our clinical staff to assess your need for preventative services including immunizations, lifestyle education, counseling to decrease risk of preventable diseases and screening for fall risk and other medical concerns.    This visit is provided free of charge (no copay) for all Medicare recipients. The clinical pharmacists at [facility] have begun to conduct these Wellness Visits which will also include a thorough review of all your medications.    As you primary medical provider recommend that you make an appointment for your Annual Wellness Visit if you have not done so already this year.  You may set up this appointment before you leave today or you may call back ([phone]) and schedule an appointment.  Please make sure when you call that you mention that you are scheduling your Annual Wellness Visit with the clinical pharmacist so that the appointment may be made for the proper length of time.     Continue current medications. Continue good therapeutic lifestyle changes which include good diet and exercise. Fall precautions discussed with patient. If an FOBT was given today- please return it to our front desk. If you are over 42 years old - you may need Prevnar 18 or the adult Pneumonia vaccine.  Flu Shots will be available at our office starting mid- September. Please call and schedule a FLU CLINIC APPOINTMENT.   Continue monitoring blood sugars at home Stay active physically Wear respiratory protection when mowing the yard  mobic- take 1 whole tab daily for 1  week then start cutting it in half and take half daily there after

## 2013-12-13 NOTE — Addendum Note (Signed)
Addended by: Earlene Plater on: 12/13/2013 09:52 AM   Modules accepted: Orders

## 2013-12-13 NOTE — Telephone Encounter (Signed)
Pt aware of xray results

## 2013-12-13 NOTE — Telephone Encounter (Signed)
Message copied by Koren Bound on Thu Dec 13, 2013  2:34 PM ------      Message from: Chipper Herb      Created: Thu Dec 13, 2013  1:19 PM       As per radiology report----take anti-inflammatory medicine as directed ------

## 2013-12-13 NOTE — Progress Notes (Signed)
Subjective:    Patient ID: [name] [name], male    DOB: 10/17/1940, 73 y.o.   MRN: [id number]  HPI Pt here for follow up and management of chronic medical problems. The patient is doing well overall. He complains of some discomfort in his left index finger. He denies any injury with this. This is the hand that he has had surgery on in the past. His health maintenance is up-to-date. He brings in blood sugars were reviewed and these are all good and will be scanned into the record.          Patient Active Problem List   Diagnosis Date Noted  . Hypothyroidism 08/07/2013  . Vitamin D deficiency 04/02/2013  . Goiter, nontoxic, multinodular   . Inguinal hernia   . Kidney stone   . Hyperlipidemia 01/15/2009  . ABNORMAL ELECTROCARDIOGRAM 01/15/2009  . DM 01/09/2009  . HIATAL HERNIA 01/09/2009  . BASAL CELL CARCINOMA, HX OF 01/09/2009  . DIVERTICULITIS, HX OF 01/09/2009   Outpatient Encounter Prescriptions as of 12/13/2013  Medication Sig  . aspirin 81 MG tablet Take 81 mg by mouth daily.    . cholecalciferol (VITAMIN D) 1000 UNITS tablet Take 1,000 Units by mouth daily. 5000 twice a week  . CVS CINNAMON PO Take 2,000 mg by mouth daily.    . Garlic 1660 MG CAPS Take by mouth daily.    . Insulin Detemir (LEVEMIR FLEXTOUCH) 100 UNIT/ML Pen Inject up to 50 units once daily  . levothyroxine (SYNTHROID, LEVOTHROID) 75 MCG tablet Take 1 tablet (75 mcg total) by mouth daily.  . metFORMIN (GLUCOPHAGE) 1000 MG tablet Take 1 tablet (1,000 mg total) by mouth 2 (two) times daily with a meal.  . quinapril (ACCUPRIL) 20 MG tablet Take 1 tablet (20 mg total) by mouth at bedtime.  . ranitidine (ZANTAC) 150 MG tablet Take 150 mg by mouth as needed.    . [DISCONTINUED] diclofenac sodium (VOLTAREN) 1 % GEL Apply up to 2 grams to area of pain on hand up to qid    Review of Systems  Constitutional: Negative.   HENT: Negative.   Eyes: Negative.   Respiratory: Negative.   Cardiovascular: Negative.     Gastrointestinal: Negative.   Endocrine: Negative.   Genitourinary: Negative.   Musculoskeletal: Positive for arthralgias (left pointer finger/knuckle pain).  Skin: Negative.   Allergic/Immunologic: Negative.   Neurological: Negative.   Hematological: Negative.   Psychiatric/Behavioral: Negative.        Objective:   Physical Exam  Nursing note and vitals reviewed. Constitutional: He is oriented to person, place, and time. He appears well-developed and well-nourished. No distress.  HENT:  Head: Normocephalic and atraumatic.  Right Ear: External ear normal.  Left Ear: External ear normal.  Mouth/Throat: Oropharynx is clear and moist. No oropharyngeal exudate.  Nasal congestion bilaterally  Eyes: Conjunctivae and EOM are normal. Pupils are equal, round, and reactive to light. Right eye exhibits no discharge. Left eye exhibits no discharge. No scleral icterus.  Neck: Normal range of motion. Neck supple. No thyromegaly present.  Cardiovascular: Normal rate, regular rhythm, normal heart sounds and intact distal pulses.  Exam reveals no gallop and no friction rub.   No murmur heard. At 72 per minute  Pulmonary/Chest: Effort normal and breath sounds normal. No respiratory distress. He has no wheezes. He has no rales. He exhibits no tenderness.  No axillary adenopathy  Abdominal: Soft. Bowel sounds are normal. He exhibits no mass. There is no tenderness. There is no rebound and  no guarding.  Genitourinary: Rectum normal and penis normal.  The prostate was enlarged but soft and smooth. There were no labs or masses. There were no rectal masses. The external genitalia were within normal limits. There were no inguinal nodes. There was a very small right inguinal hernia  Musculoskeletal: Normal range of motion. He exhibits tenderness. He exhibits no edema.  The left MP joint of the index finger of the left hand was tender to palpation and may be slightly swollen compared to the right   Lymphadenopathy:    He has no cervical adenopathy.  Neurological: He is alert and oriented to person, place, and time. He has normal reflexes. No cranial nerve deficit.  Skin: Skin is warm and dry. No rash noted. No erythema. No pallor.  Psychiatric: He has a normal mood and affect. His behavior is normal. Judgment and thought content normal.   BP 140/86  Pulse 65  Temp(Src) 97.4 F (36.3 C) (Oral)  Ht '5\' 10"'  (1.778 m)  Wt 177 lb (80.287 kg)  BMI 25.40 kg/m2  [facility] reading (PRIMARY) by  Dr. Louretta Parma hand--- the end P. joint of the index finger looks good. There is a foreign body between the fourth and fifth fingers in the web space                                       Assessment & Plan:  1. Hyperlipidemia - POCT CBC - NMR, lipoprofile  2. Hypothyroidism, unspecified hypothyroidism type - POCT CBC  3. Vitamin D deficiency - POCT CBC - Vit D  25 hydroxy (rtn osteoporosis monitoring)  4. DM - POCT CBC - POCT glycosylated hemoglobin (Hb A1C) - POCT UA - Microalbumin  5. BPH (benign prostatic hyperplasia) - POCT CBC - POCT UA - Microscopic Only - POCT urinalysis dipstick - PSA, total and free  6. Essential hypertension - POCT CBC - BMP8+EGFR - Hepatic function panel  7. Left hand pain -Meloxicam 15 mg one daily for 1 week then one half daily after eating  8. Right inguinal hernia -We will continue to watch this   Meds ordered this encounter  Medications  . Insulin Detemir (LEVEMIR FLEXTOUCH) 100 UNIT/ML Pen    Sig: Inject up to 50 units once daily    Dispense:  45 mL    Refill:  2   Patient Instructions                       Medicare Annual Wellness Visit  [facility] and the medical providers at [facility] strive to bring you the best medical care.  In doing so we not only want to address your current medical conditions and concerns but also to detect new conditions early and prevent illness, disease and health-related problems.     Medicare offers a yearly Wellness Visit which allows our clinical staff to assess your need for preventative services including immunizations, lifestyle education, counseling to decrease risk of preventable diseases and screening for fall risk and other medical concerns.    This visit is provided free of charge (no copay) for all Medicare recipients. The clinical pharmacists at [facility] have begun to conduct these Wellness Visits which will also include a thorough review of all your medications.    As you primary medical provider recommend that you make an appointment for your Annual Wellness Visit if  you have not done so already this year.  You may set up this appointment before you leave today or you may call back ([phone]) and schedule an appointment.  Please make sure when you call that you mention that you are scheduling your Annual Wellness Visit with the clinical pharmacist so that the appointment may be made for the proper length of time.     Continue current medications. Continue good therapeutic lifestyle changes which include good diet and exercise. Fall precautions discussed with patient. If an FOBT was given today- please return it to our front desk. If you are over 2 years old - you may need Prevnar 61 or the adult Pneumonia vaccine.  Flu Shots will be available at our office starting mid- September. Please call and schedule a FLU CLINIC APPOINTMENT.   Continue monitoring blood sugars at home Stay active physically Wear respiratory protection when mowing the yard    Arrie Senate MD

## 2013-12-14 LAB — BMP8+EGFR
BUN/Creatinine Ratio: 13 (ref 10–22)
BUN: 12 mg/dL (ref 8–27)
CALCIUM: 9.8 mg/dL (ref 8.6–10.2)
CHLORIDE: 103 mmol/L (ref 97–108)
CO2: 25 mmol/L (ref 18–29)
Creatinine, Ser: 0.89 mg/dL (ref 0.76–1.27)
GFR calc Af Amer: 98 mL/min/{1.73_m2} (ref >59)
GFR, EST NON AFRICAN AMERICAN: 85 mL/min/{1.73_m2} (ref >59)
GLUCOSE: 103 mg/dL — AB (ref 65–99)
Potassium: 4.5 mmol/L (ref 3.5–5.2)
Sodium: 143 mmol/L (ref 134–144)

## 2013-12-14 LAB — NMR, LIPOPROFILE
Cholesterol: 129 mg/dL (ref 100–199)
HDL Cholesterol by NMR: 54 mg/dL (ref >39)
HDL Particle Number: 33.3 umol/L (ref >=30.5)
LDL PARTICLE NUMBER: 588 nmol/L (ref <1000)
LDL Size: 20.8 nm (ref >20.5)
LDLC SERPL CALC-MCNC: 66 mg/dL (ref 0–99)
LP-IR SCORE: 30 (ref <=45)
Small LDL Particle Number: <90 nmol/L (ref <=527)
Triglycerides by NMR: 44 mg/dL (ref 0–149)

## 2013-12-14 LAB — PSA, TOTAL AND FREE
PSA FREE: 0.64 ng/mL
PSA, Free Pct: 25.6 %
PSA: 2.5 ng/mL (ref 0.0–4.0)

## 2013-12-14 LAB — HEPATIC FUNCTION PANEL
ALT: 20 IU/L (ref 0–44)
AST: 17 IU/L (ref 0–40)
Albumin: 4.5 g/dL (ref 3.5–4.8)
Alkaline Phosphatase: 55 IU/L (ref 39–117)
BILIRUBIN DIRECT: 0.14 mg/dL (ref 0.00–0.40)
Total Bilirubin: 0.4 mg/dL (ref 0.0–1.2)
Total Protein: 7.1 g/dL (ref 6.0–8.5)

## 2013-12-14 LAB — VITAMIN D 25 HYDROXY (VIT D DEFICIENCY, FRACTURES): VIT D 25 HYDROXY: 39.8 ng/mL (ref 30.0–100.0)

## 2013-12-15 LAB — URINE CULTURE

## 2013-12-15 LAB — SPECIMEN STATUS REPORT

## 2013-12-19 ENCOUNTER — Telehealth: Payer: Self-pay | Admitting: Family Medicine

## 2013-12-19 NOTE — Telephone Encounter (Signed)
Message copied by Waverly Ferrari on Wed Dec 19, 2013 12:21 PM ------      Message from: Chipper Herb      Created: Sun Dec 16, 2013  9:07 PM       Please repeat and get a clean-catch midstream urine specimen for culture and sensitivity and urinalysis. The bacteria that was grown on culture and sensitivity is resistant to most medications.      Also reconfirm if he is having any urinary tract symptoms++++ ------

## 2013-12-19 NOTE — Telephone Encounter (Signed)
Message copied by Waverly Ferrari on Wed Dec 19, 2013 12:21 PM ------      Message from: Chipper Herb      Created: Sat Dec 15, 2013  8:10 AM       The urine microalbumin is negative.      The blood sugar is elevated at 103. The creatinine the most important kidney function test is within normal limits. The electrolytes including potassium are within normal limits      All liver function tests are within normal limits      The PSA remains within normal limits and the rate of rise is within normal limits      With advanced lipid testing call cholesterol numbers are excellent. Continue current treatment and aggressive therapeutic lifestyle changes      The vitamin D level remains within normal limits, continue current treatment      Please have a lab do a uric acid and make sure they get a urine culture and sensitivity++++++++++ according to [facility] there was not enough blood to do a uric acid+++++++ ------

## 2013-12-21 ENCOUNTER — Other Ambulatory Visit (INDEPENDENT_AMBULATORY_CARE_PROVIDER_SITE_OTHER): Payer: Commercial Managed Care - HMO

## 2013-12-21 DIAGNOSIS — M25522 Pain in left elbow: Secondary | ICD-10-CM

## 2013-12-21 DIAGNOSIS — N390 Urinary tract infection, site not specified: Secondary | ICD-10-CM

## 2013-12-21 DIAGNOSIS — M25529 Pain in unspecified elbow: Secondary | ICD-10-CM

## 2013-12-21 MED ORDER — CEPHALEXIN 500 MG PO CAPS: 500.0000 mg | ORAL_CAPSULE | Freq: Three times a day (TID) | ORAL | Status: DC

## 2013-12-21 NOTE — Telephone Encounter (Signed)
Discussed with Dietrich Pates, FNP. Keflex ordered and sent to pharmacy electronically. Left message on patient's home phone that med can be picked up at pharmacy.

## 2013-12-21 NOTE — Progress Notes (Signed)
Pt came in for lab  only 

## 2013-12-21 NOTE — Telephone Encounter (Signed)
Patient called requesting antibiotic for UTI.  He does complain of back pain and urinary frequency. He left a new specimen this morning for culture. Made patient aware that those results will not be available for several days. Do you want to prescribe an antibiotic based on previous culture? Patient can be reached on home phone.

## 2013-12-22 LAB — URIC ACID: Uric Acid: 5.1 mg/dL (ref 3.7–8.6)

## 2013-12-23 LAB — URINE CULTURE

## 2013-12-26 ENCOUNTER — Telehealth: Payer: Self-pay | Admitting: Family Medicine

## 2013-12-26 NOTE — Telephone Encounter (Signed)
Message copied by Waverly Ferrari on Wed Dec 26, 2013  2:45 PM ------      Message from: Chipper Herb      Created: Sat Dec 22, 2013  8:21 AM       The uric acid is within normal limit ------

## 2013-12-26 NOTE — Telephone Encounter (Signed)
Message copied by Waverly Ferrari on Wed Dec 26, 2013  2:44 PM ------      Message from: Chipper Herb      Created: Sun Dec 23, 2013  9:18 PM       The second urine culture grew out Escherichia coli which is sensitive to doxycycline. Please call in Doxycycline 100 mg, #28, one twice daily for infection until completed. Taking antibiotic with food --- Please repeat a urine culture when the antibiotic is completed ------

## 2013-12-27 ENCOUNTER — Telehealth: Payer: Self-pay | Admitting: Family Medicine

## 2013-12-27 NOTE — Telephone Encounter (Signed)
Pt aware of lab results. Script for Doxycycline called to [facility] per [facility].

## 2014-01-01 ENCOUNTER — Telehealth: Payer: Self-pay | Admitting: Family Medicine

## 2014-01-02 MED ORDER — INSULIN DETEMIR 100 UNIT/ML FLEXPEN: PEN_INJECTOR | SUBCUTANEOUS | Status: DC

## 2014-01-02 NOTE — Telephone Encounter (Signed)
done

## 2014-01-30 LAB — HM DIABETES EYE EXAM

## 2014-02-06 ENCOUNTER — Ambulatory Visit (INDEPENDENT_AMBULATORY_CARE_PROVIDER_SITE_OTHER): Payer: Commercial Managed Care - HMO

## 2014-02-06 DIAGNOSIS — Z23 Encounter for immunization: Secondary | ICD-10-CM

## 2014-02-12 ENCOUNTER — Ambulatory Visit: Payer: Commercial Managed Care - HMO

## 2014-05-01 ENCOUNTER — Encounter: Payer: Self-pay | Admitting: Family Medicine

## 2014-05-01 ENCOUNTER — Ambulatory Visit (INDEPENDENT_AMBULATORY_CARE_PROVIDER_SITE_OTHER): Payer: Commercial Managed Care - HMO | Admitting: Family Medicine

## 2014-05-01 VITALS — BP 138/86 | HR 90 | Temp 96.8°F | Ht 70.0 in | Wt 173.0 lb

## 2014-05-01 DIAGNOSIS — E119 Type 2 diabetes mellitus without complications: Secondary | ICD-10-CM

## 2014-05-01 DIAGNOSIS — N400 Enlarged prostate without lower urinary tract symptoms: Secondary | ICD-10-CM | POA: Diagnosis not present

## 2014-05-01 DIAGNOSIS — J310 Chronic rhinitis: Secondary | ICD-10-CM

## 2014-05-01 DIAGNOSIS — J329 Chronic sinusitis, unspecified: Secondary | ICD-10-CM | POA: Diagnosis not present

## 2014-05-01 DIAGNOSIS — I10 Essential (primary) hypertension: Secondary | ICD-10-CM

## 2014-05-01 DIAGNOSIS — E039 Hypothyroidism, unspecified: Secondary | ICD-10-CM | POA: Diagnosis not present

## 2014-05-01 DIAGNOSIS — E782 Mixed hyperlipidemia: Secondary | ICD-10-CM

## 2014-05-01 DIAGNOSIS — E559 Vitamin D deficiency, unspecified: Secondary | ICD-10-CM

## 2014-05-01 DIAGNOSIS — Z8249 Family history of ischemic heart disease and other diseases of the circulatory system: Secondary | ICD-10-CM | POA: Diagnosis not present

## 2014-05-01 LAB — POCT CBC
Granulocyte percent: 74.3 %G (ref 37–80)
HEMATOCRIT: 48.7 % (ref 43.5–53.7)
HEMOGLOBIN: 15.2 g/dL (ref 14.1–18.1)
Lymph, poc: 1.8 (ref 0.6–3.4)
MCH, POC: 26.4 pg — AB (ref 27–31.2)
MCHC: 31.2 g/dL — AB (ref 31.8–35.4)
MCV: 84.6 fL (ref 80–97)
MPV: 8.1 fL (ref 0–99.8)
POC Granulocyte: 6.4 (ref 2–6.9)
POC LYMPH PERCENT: 20.9 %L (ref 10–50)
Platelet Count, POC: 304 10*3/uL (ref 142–424)
RBC: 5.8 M/uL (ref 4.69–6.13)
RDW, POC: 13.8 %
WBC: 8.6 10*3/uL (ref 4.6–10.2)

## 2014-05-01 LAB — POCT GLYCOSYLATED HEMOGLOBIN (HGB A1C): HEMOGLOBIN A1C: 9

## 2014-05-01 MED ORDER — QUINAPRIL HCL 20 MG PO TABS: 20.0000 mg | ORAL_TABLET | Freq: Every day | ORAL | Status: DC

## 2014-05-01 MED ORDER — METFORMIN HCL 1000 MG PO TABS: 1000.0000 mg | ORAL_TABLET | Freq: Two times a day (BID) | ORAL | Status: DC

## 2014-05-01 MED ORDER — INSULIN DETEMIR 100 UNIT/ML FLEXPEN: PEN_INJECTOR | SUBCUTANEOUS | Status: DC

## 2014-05-01 MED ORDER — LEVOTHYROXINE SODIUM 75 MCG PO TABS: 75.0000 ug | ORAL_TABLET | Freq: Every day | ORAL | Status: DC

## 2014-05-01 MED ORDER — AMOXICILLIN 875 MG PO TABS: 875.0000 mg | ORAL_TABLET | Freq: Two times a day (BID) | ORAL | Status: DC

## 2014-05-01 NOTE — Addendum Note (Signed)
Addended by: Zannie Cove on: 05/01/2014 08:48 AM   Modules accepted: Orders

## 2014-05-01 NOTE — Patient Instructions (Addendum)
Medicare Annual Wellness Visit  [facility] and the medical providers at [facility] strive to bring you the best medical care.  In doing so we not only want to address your current medical conditions and concerns but also to detect new conditions early and prevent illness, disease and health-related problems.    Medicare offers a yearly Wellness Visit which allows our clinical staff to assess your need for preventative services including immunizations, lifestyle education, counseling to decrease risk of preventable diseases and screening for fall risk and other medical concerns.    This visit is provided free of charge (no copay) for all Medicare recipients. The clinical pharmacists at [facility] have begun to conduct these Wellness Visits which will also include a thorough review of all your medications.    As you primary medical provider recommend that you make an appointment for your Annual Wellness Visit if you have not done so already this year.  You may set up this appointment before you leave today or you may call back ([phone]) and schedule an appointment.  Please make sure when you call that you mention that you are scheduling your Annual Wellness Visit with the clinical pharmacist so that the appointment may be made for the proper length of time.     Continue current medications. Continue good therapeutic lifestyle changes which include good diet and exercise. Fall precautions discussed with patient. If an FOBT was given today- please return it to our front desk. If you are over 56 years old - you may need Prevnar 88 or the adult Pneumonia vaccine.  Flu Shots will be available at our office starting mid- September. Please call and schedule a FLU CLINIC APPOINTMENT.   Take antibiotic as directed Take Mucinex maximum strength plain blue and white in color, over-the-counter, 1 twice daily with a large glass of  water Continue to use saline frequently in your nose Avoid NSAIDs as much as possible as these can damage the kidneys more because of your history of diabetes Please return the FOBT and we will call you with the results of the lab work when those results become available

## 2014-05-01 NOTE — Progress Notes (Signed)
Subjective:    Patient ID: [name] [name], male    DOB: Mar 13, 1941, 74 y.o.   MRN: [id number]  HPI Pt here for follow up and management of chronic medical problems which include hypothyroidism, hypertension, diabetes and hyperlipidemia. The patient continues to take his medications for these problems. He does today complain of some sinus pressure. This is been going on for several weeks. He denies fever or headache and says the sputum and congestion are mostly gray. He says he caught this from his wife. The patient is due today to return an FOBT and he will get lab work this morning. He is requesting refills on his insulin metformin quinapril and Synthroid. He brings in his blood sugars for review and over the past month and these have been running a little bit higher than usual. It is significant that in reviewing his family history there is a strong family history for heart disease. He has not seen the cardiologist since 2012. We will make arrangements for him to see him again for follow-up.      Patient Active Problem List   Diagnosis Date Noted  . Right inguinal hernia 12/13/2013  . Essential hypertension 12/13/2013  . BPH (benign prostatic hyperplasia) 12/13/2013  . Hypothyroidism 08/07/2013  . Vitamin D deficiency 04/02/2013  . Goiter, nontoxic, multinodular   . Inguinal hernia   . Kidney stone   . Hyperlipidemia 01/15/2009  . ABNORMAL ELECTROCARDIOGRAM 01/15/2009  . DM 01/09/2009  . HIATAL HERNIA 01/09/2009  . BASAL CELL CARCINOMA, HX OF 01/09/2009  . DIVERTICULITIS, HX OF 01/09/2009   Outpatient Encounter Prescriptions as of 05/01/2014  Medication Sig  . aspirin 81 MG tablet Take 81 mg by mouth daily.    . cholecalciferol (VITAMIN D) 1000 UNITS tablet Take 1,000 Units by mouth daily. 5000 twice a week  . CVS CINNAMON PO Take 2,000 mg by mouth daily.    . Garlic 2505 MG CAPS Take by mouth daily.    . Insulin Detemir (LEVEMIR FLEXTOUCH) 100 UNIT/ML Pen Inject up to 50 units  once daily  . levothyroxine (SYNTHROID, LEVOTHROID) 75 MCG tablet Take 1 tablet (75 mcg total) by mouth daily.  . metFORMIN (GLUCOPHAGE) 1000 MG tablet Take 1 tablet (1,000 mg total) by mouth 2 (two) times daily with a meal.  . quinapril (ACCUPRIL) 20 MG tablet Take 1 tablet (20 mg total) by mouth at bedtime.  . ranitidine (ZANTAC) 150 MG tablet Take 150 mg by mouth as needed.    . [DISCONTINUED] cephALEXin (KEFLEX) 500 MG capsule Take 1 capsule (500 mg total) by mouth 3 (three) times daily.  . [DISCONTINUED] meloxicam (MOBIC) 15 MG tablet Take 1 tablet (15 mg total) by mouth daily. As directed    Review of Systems  Constitutional: Negative.   HENT: Positive for sinus pressure.   Eyes: Negative.   Respiratory: Negative.   Cardiovascular: Negative.   Gastrointestinal: Negative.   Endocrine: Negative.   Genitourinary: Negative.   Musculoskeletal: Negative.   Skin: Negative.   Allergic/Immunologic: Negative.   Neurological: Negative.   Hematological: Negative.   Psychiatric/Behavioral: Negative.        Objective:   Physical Exam  Constitutional: He is oriented to person, place, and time. He appears well-developed and well-nourished.  The patient is pleasant and alert and other than his sinus problems and congestion is doing well.  HENT:  Head: Normocephalic and atraumatic.  Right Ear: External ear normal.  Left Ear: External ear normal.  Mouth/Throat: Oropharynx is clear and moist.  No oropharyngeal exudate.  Nasal congestion bilaterally  Eyes: Conjunctivae and EOM are normal. Pupils are equal, round, and reactive to light. Right eye exhibits no discharge. Left eye exhibits no discharge. No scleral icterus.  Neck: Normal range of motion. Neck supple. No tracheal deviation present. No thyromegaly present.  No anterior cervical nodes or carotid bruits  Cardiovascular: Normal rate, regular rhythm, normal heart sounds and intact distal pulses.   No murmur heard. The heart is regular  at 72/m  Pulmonary/Chest: Effort normal and breath sounds normal. No respiratory distress. He has no wheezes. He has no rales. He exhibits no tenderness.  No axillary adenopathy. The lungs are clear anteriorly and posteriorly and there is a slightly congested cough.  Abdominal: Soft. Bowel sounds are normal. He exhibits no mass. There is no tenderness. There is no rebound and no guarding.  No abdominal bruits  Musculoskeletal: Normal range of motion. He exhibits no edema or tenderness.  Lymphadenopathy:    He has no cervical adenopathy.  Neurological: He is alert and oriented to person, place, and time. He has normal reflexes. No cranial nerve deficit.  Skin: Skin is warm and dry. No rash noted. No erythema. No pallor.  Psychiatric: He has a normal mood and affect. His behavior is normal. Judgment and thought content normal.  Nursing note and vitals reviewed.  BP 138/86 mmHg  Pulse 90  Temp(Src) 96.8 F (36 C) (Oral)  Ht 5' 10" (1.778 m)  Wt 173 lb (78.472 kg)  BMI 24.82 kg/m2        Assessment & Plan:  1. Hyperlipidemia -Continue aggressive therapeutic lifestyle changes which include diet and exercise - POCT CBC - NMR, lipoprofile  2. Hypothyroidism, unspecified hypothyroidism type -Continue current medication until lab work results are returned - POCT CBC - Thyroid Panel With TSH  3. Vitamin D deficiency -Continue current treatment until lab work results are reviewed - POCT CBC - Vit D  25 hydroxy (rtn osteoporosis monitoring)  4. BPH (benign prostatic hyperplasia) - POCT CBC  5. Essential hypertension -Continue quinapril as blood pressure is under good control - POCT CBC - BMP8+EGFR - Hepatic function panel  6. Type 2 diabetes mellitus without complication -Continue insulins and aggressive dietary management with exercise - POCT CBC - POCT glycosylated hemoglobin (Hb A1C)  7. Rhinosinusitis -Use nasal saline and Mucinex as directed - amoxicillin (AMOXIL)  875 MG tablet; Take 1 tablet (875 mg total) by mouth 2 (two) times daily.  Dispense: 28 tablet; Refill: 0  Meds ordered this encounter  Medications  . quinapril (ACCUPRIL) 20 MG tablet    Sig: Take 1 tablet (20 mg total) by mouth at bedtime.    Dispense:  90 tablet    Refill:  3  . metFORMIN (GLUCOPHAGE) 1000 MG tablet    Sig: Take 1 tablet (1,000 mg total) by mouth 2 (two) times daily with a meal.    Dispense:  180 tablet    Refill:  3  . levothyroxine (SYNTHROID, LEVOTHROID) 75 MCG tablet    Sig: Take 1 tablet (75 mcg total) by mouth daily.    Dispense:  90 tablet    Refill:  3  . Insulin Detemir (LEVEMIR FLEXTOUCH) 100 UNIT/ML Pen    Sig: Inject up to 50 units once daily    Dispense:  45 mL    Refill:  3  . amoxicillin (AMOXIL) 875 MG tablet    Sig: Take 1 tablet (875 mg total) by mouth 2 (two) times daily.  Dispense:  28 tablet    Refill:  0   Patient Instructions                       Medicare Annual Wellness Visit  [facility] and the medical providers at [facility] strive to bring you the best medical care.  In doing so we not only want to address your current medical conditions and concerns but also to detect new conditions early and prevent illness, disease and health-related problems.    Medicare offers a yearly Wellness Visit which allows our clinical staff to assess your need for preventative services including immunizations, lifestyle education, counseling to decrease risk of preventable diseases and screening for fall risk and other medical concerns.    This visit is provided free of charge (no copay) for all Medicare recipients. The clinical pharmacists at [facility] have begun to conduct these Wellness Visits which will also include a thorough review of all your medications.    As you primary medical provider recommend that you make an appointment for your Annual Wellness Visit if you have not done so already this  year.  You may set up this appointment before you leave today or you may call back ([phone]) and schedule an appointment.  Please make sure when you call that you mention that you are scheduling your Annual Wellness Visit with the clinical pharmacist so that the appointment may be made for the proper length of time.     Continue current medications. Continue good therapeutic lifestyle changes which include good diet and exercise. Fall precautions discussed with patient. If an FOBT was given today- please return it to our front desk. If you are over 17 years old - you may need Prevnar 9 or the adult Pneumonia vaccine.  Flu Shots will be available at our office starting mid- September. Please call and schedule a FLU CLINIC APPOINTMENT.   Take antibiotic as directed Take Mucinex maximum strength plain blue and white in color, over-the-counter, 1 twice daily with a large glass of water Continue to use saline frequently in your nose Avoid NSAIDs as much as possible as these can damage the kidneys more because of your history of diabetes Please return the FOBT and we will call you with the results of the lab work when those results become available   Arrie Senate MD

## 2014-05-03 LAB — THYROID PANEL WITH TSH
Free Thyroxine Index: 2.8 (ref 1.2–4.9)
T3 Uptake Ratio: 29 % (ref 24–39)
T4, Total: 9.6 ug/dL (ref 4.5–12.0)
TSH: 2.04 u[IU]/mL (ref 0.450–4.500)

## 2014-05-03 LAB — VITAMIN D 25 HYDROXY (VIT D DEFICIENCY, FRACTURES): Vit D, 25-Hydroxy: 40.3 ng/mL (ref 30.0–100.0)

## 2014-05-03 LAB — BMP8+EGFR
BUN/Creatinine Ratio: 14 (ref 10–22)
BUN: 12 mg/dL (ref 8–27)
CO2: 25 mmol/L (ref 18–29)
Calcium: 9.8 mg/dL (ref 8.6–10.2)
Chloride: 99 mmol/L (ref 97–108)
Creatinine, Ser: 0.87 mg/dL (ref 0.76–1.27)
GFR calc non Af Amer: 86 mL/min/{1.73_m2} (ref >59)
GFR, EST AFRICAN AMERICAN: 99 mL/min/{1.73_m2} (ref >59)
GLUCOSE: 184 mg/dL — AB (ref 65–99)
Potassium: 4.7 mmol/L (ref 3.5–5.2)
SODIUM: 139 mmol/L (ref 134–144)

## 2014-05-03 LAB — NMR, LIPOPROFILE
CHOLESTEROL: 125 mg/dL (ref 100–199)
HDL CHOLESTEROL BY NMR: 43 mg/dL (ref >39)
HDL Particle Number: 29.5 umol/L — ABNORMAL LOW (ref >=30.5)
LDL Particle Number: 600 nmol/L (ref <1000)
LDL SIZE: 20.9 nm (ref >20.5)
LDL-C: 68 mg/dL (ref 0–99)
LP-IR Score: 63 — ABNORMAL HIGH (ref <=45)
Small LDL Particle Number: 117 nmol/L (ref <=527)
Triglycerides by NMR: 71 mg/dL (ref 0–149)

## 2014-05-03 LAB — HEPATIC FUNCTION PANEL
ALT: 19 IU/L (ref 0–44)
AST: 15 IU/L (ref 0–40)
Albumin: 4.5 g/dL (ref 3.5–4.8)
Alkaline Phosphatase: 88 IU/L (ref 39–117)
BILIRUBIN DIRECT: 0.09 mg/dL (ref 0.00–0.40)
TOTAL PROTEIN: 7.1 g/dL (ref 6.0–8.5)
Total Bilirubin: 0.3 mg/dL (ref 0.0–1.2)

## 2014-05-08 ENCOUNTER — Other Ambulatory Visit: Payer: Commercial Managed Care - HMO

## 2014-05-08 DIAGNOSIS — Z1212 Encounter for screening for malignant neoplasm of rectum: Secondary | ICD-10-CM | POA: Diagnosis not present

## 2014-05-08 NOTE — Progress Notes (Signed)
Lab only 

## 2014-05-10 LAB — FECAL OCCULT BLOOD, IMMUNOCHEMICAL: FECAL OCCULT BLD: NEGATIVE

## 2014-05-29 ENCOUNTER — Ambulatory Visit (INDEPENDENT_AMBULATORY_CARE_PROVIDER_SITE_OTHER): Payer: Commercial Managed Care - HMO | Admitting: Cardiology

## 2014-05-29 ENCOUNTER — Encounter: Payer: Self-pay | Admitting: Cardiology

## 2014-05-29 VITALS — BP 132/80 | HR 140 | Ht 70.0 in | Wt 178.0 lb

## 2014-05-29 DIAGNOSIS — I10 Essential (primary) hypertension: Secondary | ICD-10-CM | POA: Diagnosis not present

## 2014-05-29 NOTE — Progress Notes (Signed)
Cardiology Office Note   Date:  05/29/2014   ID:  [name] [name], DOB 09-21-1940, MRN [id number]  PCP:  Redge Gainer, MD  Cardiologist:   Minus Breeding, MD   No chief complaint on file.     History of Present Illness: [name] [name] is a 74 y.o. male who presents for evaluation multiple cardiovascular risk factors. I saw him greater than 3 years ago. He had an exercise treadmill test. There was no evidence of ischemia though he did have some postprocedure atrial fibrillation briefly. He has had a workup in the past for some palpitations and I did review an old echo that was normal. Since I last saw him he has done relatively well. He doesn't exercise very much. However, with his current level of activity he denies any cardiovascular symptoms. He does not get chest pressure, neck or arm discomfort. He does not have palpitations, presyncope or syncope. He has no shortness of breath, PND or orthopnea. He doesn't get weight gain or edema. He does a lot of golfing and used to work the [employer] recent limited by knee pain. He was able to shovel snow recently without symptoms.    Past Medical History  Diagnosis Date  . Diabetes mellitus   . Liver hemangioma   . Gastroesophageal reflux disease with hiatal hernia   . Erectile dysfunction   . Goiter, nontoxic, multinodular     with macrocyst  . Diverticulitis   . Inguinal hernia     right side  . Kidney stone 1980  . Nephrolithiasis   . Leg fracture     Left  . Basal cell carcinoma     left forehead, nose, neck  . Hiatal hernia     With reflex  . Cataract   . Hyperlipidemia   . Vitamin D deficiency     Past Surgical History  Procedure Laterality Date  . Laceration repair  2008    Dr. [facility] Curt -to hand  . Back surgery  1984  . Tibia fracture surgery      left leg     Current Outpatient Prescriptions  Medication Sig Dispense Refill  . aspirin 81 MG tablet Take 81 mg by mouth daily.      . cholecalciferol (VITAMIN D) 1000  UNITS tablet Take 1,000 Units by mouth daily. 5000 twice a week    . CVS CINNAMON PO Take 2,000 mg by mouth daily.      . Garlic 1540 MG CAPS Take by mouth daily.      . Insulin Detemir (LEVEMIR FLEXTOUCH) 100 UNIT/ML Pen Inject up to 50 units once daily 45 mL 3  . levothyroxine (SYNTHROID, LEVOTHROID) 75 MCG tablet Take 1 tablet (75 mcg total) by mouth daily. 90 tablet 3  . metFORMIN (GLUCOPHAGE) 1000 MG tablet Take 1 tablet (1,000 mg total) by mouth 2 (two) times daily with a meal. 180 tablet 3  . quinapril (ACCUPRIL) 20 MG tablet Take 1 tablet (20 mg total) by mouth at bedtime. 90 tablet 3  . ranitidine (ZANTAC) 150 MG tablet Take 150 mg by mouth as needed.       No current facility-administered medications for this visit.    Allergies:   Actos; Lisinopril; Invokana; Niaspan; and Ramipril    Social History:  The patient  reports that he quit smoking about 31 years ago. His smoking use included Cigarettes. He has a 27 pack-year smoking history. He has never used smokeless tobacco. He reports that he does not drink alcohol  or use illicit drugs.   Family History:  The patient's family history includes Cancer in his father; Diabetes in his other; Early death (age of onset: 65) in his brother; Heart attack in his sister; Heart disease in his brother, mother, and sister.    ROS:  Please see the history of present illness.   Otherwise, review of systems are positive for none.   All other systems are reviewed and negative.    PHYSICAL EXAM: VS:  BP 132/80 mmHg  Pulse 140  Ht 5\' 10"  (1.778 m)  Wt 178 lb (80.74 kg)  BMI 25.54 kg/m2 , BMI Body mass index is 25.54 kg/(m^2). GEN: Well nourished, well developed, in no acute distress HEENT: normal Neck: no JVD, carotid bruits, or masses Cardiac: RRR; no murmurs, rubs, or gallops,no edema  Respiratory:  clear to auscultation bilaterally, normal work of breathing GI: soft, nontender, nondistended, + BS MS: no deformity or atrophy Skin: warm and  dry, no rash Neuro:  Strength and sensation are intact Psych: euthymic mood, full affect   EKG:  EKG is ordered today. The ekg ordered today demonstrates Sinus rhythm, rate 80, axis within normal limits, intervals within normal limits, no acute ST-T wave changes.  Artifact    Recent Labs: 05/01/2014: ALT 19; BUN 12; Creatinine 0.87; Hemoglobin 15.2; Potassium 4.7; Sodium 139; TSH 2.040    Lipid Panel    Component Value Date/Time   CHOL 125 05/01/2014 0837   TRIG 71 05/01/2014 0837   TRIG 102 10/16/2012 1545   HDL 43 05/01/2014 0837   LDLCALC 66 12/13/2013 0842   LDLCALC 63 10/16/2012 1545      Wt Readings from Last 3 Encounters:  05/29/14 178 lb (80.74 kg)  05/01/14 173 lb (78.472 kg)  12/13/13 177 lb (80.287 kg)      Other studies Reviewed: Additional studies/ records that were reviewed today include: Previous stress test and echo and outpatient labs Review of the above records demonstrates: As above   ASSESSMENT AND PLAN:  HTN:  The blood pressure is at target. No change in medications is indicated. We will continue with therapeutic lifestyle changes (TLC).  DM:  His last hemoglobin A1c was 9. He's going to be followed for this by the clinical pharmacist. We discussed the need to have better control. In particular he needs risk reduction. He needs to exercise.  RISK REDUCTION:  He has no new symptoms since his last stress test. I don't think further cardiovascular testing is indicated. He has a very high functional level without symptoms. I would however suggest seeing him back in a couple of years likely to repeat a stress test. Current medicines are reviewed at length with the patient today.  The patient does not have concerns regarding medicines.  The following changes have been made:  no change  Labs/ tests ordered today include: None  No orders of the defined types were placed in this encounter.     Disposition:   FU with me in two years.  Signed, Minus Breeding, MD  05/29/2014 4:12 PM    [facility]

## 2014-05-29 NOTE — Patient Instructions (Signed)
The current medical regimen is effective;  continue present plan and medications.  Follow up in 2 years with Dr. Percival Spanish in [place].  You will receive a letter in the mail 2 months before you are due.  Please call us when you receive this letter to schedule your follow up appointment.  Thank you for choosing [facility]!!

## 2014-09-13 ENCOUNTER — Ambulatory Visit (INDEPENDENT_AMBULATORY_CARE_PROVIDER_SITE_OTHER): Payer: Commercial Managed Care - HMO | Admitting: Family Medicine

## 2014-09-13 ENCOUNTER — Encounter: Payer: Self-pay | Admitting: Family Medicine

## 2014-09-13 VITALS — BP 113/78 | HR 81 | Temp 97.6°F | Ht 70.0 in | Wt 171.0 lb

## 2014-09-13 DIAGNOSIS — I10 Essential (primary) hypertension: Secondary | ICD-10-CM

## 2014-09-13 DIAGNOSIS — J301 Allergic rhinitis due to pollen: Secondary | ICD-10-CM

## 2014-09-13 DIAGNOSIS — E039 Hypothyroidism, unspecified: Secondary | ICD-10-CM | POA: Diagnosis not present

## 2014-09-13 DIAGNOSIS — E1136 Type 2 diabetes mellitus with diabetic cataract: Secondary | ICD-10-CM

## 2014-09-13 DIAGNOSIS — E782 Mixed hyperlipidemia: Secondary | ICD-10-CM

## 2014-09-13 DIAGNOSIS — E119 Type 2 diabetes mellitus without complications: Secondary | ICD-10-CM | POA: Diagnosis not present

## 2014-09-13 DIAGNOSIS — E559 Vitamin D deficiency, unspecified: Secondary | ICD-10-CM

## 2014-09-13 DIAGNOSIS — Z8249 Family history of ischemic heart disease and other diseases of the circulatory system: Secondary | ICD-10-CM

## 2014-09-13 LAB — POCT CBC
GRANULOCYTE PERCENT: 80.3 % — AB (ref 37–80)
HCT, POC: 44.3 % (ref 43.5–53.7)
Hemoglobin: 14.6 g/dL (ref 14.1–18.1)
Lymph, poc: 1.6 (ref 0.6–3.4)
MCH, POC: 28.3 pg (ref 27–31.2)
MCHC: 33 g/dL (ref 31.8–35.4)
MCV: 85.5 fL (ref 80–97)
MPV: 8.5 fL (ref 0–99.8)
PLATELET COUNT, POC: 200 10*3/uL (ref 142–424)
POC Granulocyte: 8.4 — AB (ref 2–6.9)
POC LYMPH %: 15 % (ref 10–50)
RBC: 5.17 M/uL (ref 4.69–6.13)
RDW, POC: 14.1 %
WBC: 10.4 10*3/uL — AB (ref 4.6–10.2)

## 2014-09-13 LAB — POCT GLYCOSYLATED HEMOGLOBIN (HGB A1C): HEMOGLOBIN A1C: 9.7

## 2014-09-13 LAB — LIPID PANEL
HDL: 47 mg/dL (ref 35–70)
LDL CALC: 60 mg/dL

## 2014-09-13 MED ORDER — FLUTICASONE PROPIONATE 50 MCG/ACT NA SUSP: NASAL | Status: DC

## 2014-09-13 MED ORDER — QUINAPRIL HCL 20 MG PO TABS: 20.0000 mg | ORAL_TABLET | Freq: Every day | ORAL | Status: DC

## 2014-09-13 MED ORDER — LEVOTHYROXINE SODIUM 75 MCG PO TABS: 75.0000 ug | ORAL_TABLET | Freq: Every day | ORAL | Status: DC

## 2014-09-13 MED ORDER — METFORMIN HCL 1000 MG PO TABS: 1000.0000 mg | ORAL_TABLET | Freq: Two times a day (BID) | ORAL | Status: DC

## 2014-09-13 MED ORDER — INSULIN DETEMIR 100 UNIT/ML FLEXPEN: PEN_INJECTOR | SUBCUTANEOUS | Status: DC

## 2014-09-13 NOTE — Progress Notes (Signed)
Subjective:    Patient ID: [name] [name], male    DOB: Sep 04, 1940, 74 y.o.   MRN: [id number]  HPI Pt here for follow up and management of chronic medical problems which includes diabetes, hypertension, hypothyroid, and hyperlipidemia. He is taking medications regularly. The patient is doing well overall. He brings in his blood sugars for review and these will be scanned into the record and all these look fairly good. The patient indicates she's not getting as much exercise as he would like. He also been dealing with some family issues at home i.e. with his grandson which is very stressful. He denies chest pain shortness of breath any problems with his GI tract and problems voiding.      Patient Active Problem List   Diagnosis Date Noted  . Right inguinal hernia 12/13/2013  . Essential hypertension 12/13/2013  . BPH (benign prostatic hyperplasia) 12/13/2013  . Hypothyroidism 08/07/2013  . Vitamin D deficiency 04/02/2013  . Goiter, nontoxic, multinodular   . Kidney stone   . Hyperlipidemia 01/15/2009  . ABNORMAL ELECTROCARDIOGRAM 01/15/2009  . DM 01/09/2009  . HIATAL HERNIA 01/09/2009  . BASAL CELL CARCINOMA, HX OF 01/09/2009  . DIVERTICULITIS, HX OF 01/09/2009   Outpatient Encounter Prescriptions as of 09/13/2014  Medication Sig  . aspirin 81 MG tablet Take 81 mg by mouth daily.    . cholecalciferol (VITAMIN D) 1000 UNITS tablet Take 1,000 Units by mouth daily. 5000 twice a week  . CVS CINNAMON PO Take 2,000 mg by mouth daily.    . Garlic 0786 MG CAPS Take by mouth daily.    . Insulin Detemir (LEVEMIR FLEXTOUCH) 100 UNIT/ML Pen Inject up to 50 units once daily  . levothyroxine (SYNTHROID, LEVOTHROID) 75 MCG tablet Take 1 tablet (75 mcg total) by mouth daily.  . metFORMIN (GLUCOPHAGE) 1000 MG tablet Take 1 tablet (1,000 mg total) by mouth 2 (two) times daily with a meal.  . quinapril (ACCUPRIL) 20 MG tablet Take 1 tablet (20 mg total) by mouth at bedtime.  . ranitidine (ZANTAC)  150 MG tablet Take 150 mg by mouth as needed.     No facility-administered encounter medications on file as of 09/13/2014.      Review of Systems  Constitutional: Negative.   HENT: Negative.        Seasonal allergies  Eyes: Negative.   Respiratory: Negative.   Cardiovascular: Negative.   Gastrointestinal: Negative.   Endocrine: Negative.   Genitourinary: Negative.   Musculoskeletal: Negative.   Skin: Negative.   Allergic/Immunologic: Negative.   Neurological: Negative.   Hematological: Negative.   Psychiatric/Behavioral: Negative.        Objective:   Physical Exam  Constitutional: He is oriented to person, place, and time. He appears well-developed and well-nourished.  HENT:  Head: Normocephalic and atraumatic.  Right Ear: External ear normal.  Left Ear: External ear normal.  Mouth/Throat: Oropharynx is clear and moist. No oropharyngeal exudate.  Nasal congestion bilaterally  Eyes: Conjunctivae and EOM are normal. Pupils are equal, round, and reactive to light. Right eye exhibits no discharge. Left eye exhibits no discharge. No scleral icterus.  Neck: Normal range of motion. Neck supple. No thyromegaly present.  No carotid bruits, anterior cervical adenopathy or thyromegaly  Cardiovascular: Normal rate, regular rhythm, normal heart sounds and intact distal pulses.  Exam reveals no gallop and no friction rub.   No murmur heard. At 72/m  Pulmonary/Chest: Effort normal and breath sounds normal. No respiratory distress. He has no wheezes. He has  no rales. He exhibits no tenderness.  Clear anteriorly and posteriorly  Abdominal: Soft. Bowel sounds are normal. He exhibits no mass. There is no tenderness. There is no rebound and no guarding.  Nontender without masses or organ enlargement or inguinal adenopathy or bruits  Musculoskeletal: Normal range of motion. He exhibits no edema or tenderness.  Lymphadenopathy:    He has no cervical adenopathy.  Neurological: He is alert and  oriented to person, place, and time. He has normal reflexes. No cranial nerve deficit.  Skin: Skin is warm and dry. No rash noted. No erythema. No pallor.  Psychiatric: He has a normal mood and affect. His behavior is normal. Judgment and thought content normal.  Nursing note and vitals reviewed.  BP 113/78 mmHg  Pulse 81  Temp(Src) 97.6 F (36.4 C) (Oral)  Ht '5\' 10"'  (1.778 m)  Wt 171 lb (77.565 kg)  BMI 24.54 kg/m2        Assessment & Plan:  1. Hyperlipidemia -The patient should continue current treatment pending results of lab work being done today. - POCT CBC - NMR, lipoprofile  2. Hypothyroidism, unspecified hypothyroidism type -Patient should continue current treatment pending lab work being done today. - POCT CBC - Thyroid Panel With TSH  3. Vitamin D deficiency -Continue current treatment pending lab work being done today - POCT CBC - Vit D  25 hydroxy (rtn osteoporosis monitoring)  4. Essential hypertension -Blood pressure is good today he should continue with current treatment - POCT CBC - BMP8+EGFR - Hepatic function panel  5. Type 2 diabetes mellitus without complication -Home blood sugars appear to be good and the biggest difference is that patient should continue with more aggressive therapeutic lifestyle changes which especially includes exercise. We will adjust medication pending results of lab work - POCT CBC - BMP8+EGFR - POCT glycosylated hemoglobin (Hb A1C)  6. Family history of heart disease - POCT CBC - BMP8+EGFR - Hepatic function panel - NMR, lipoprofile  7. 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 Allergic rhinitis due to pollen -Use nasal saline and consider trying either Claritin or Allegra or Zyrtec over-the-counter in addition to Flonase that was prescribed today. - fluticasone (FLONASE) 50 MCG/ACT nasal spray; One to 2 sprays each nostril at bedtime  Dispense: 16 g; Refill:  6  Patient Instructions                       Medicare Annual Wellness Visit  [facility] and the medical providers at [facility] strive to bring you the best medical care.  In doing so we not only want to address your current medical conditions and concerns but also to detect new conditions early and prevent illness, disease and health-related problems.    Medicare offers a yearly Wellness Visit which allows our clinical staff to assess your need for preventative services including immunizations, lifestyle education, counseling to decrease risk of preventable diseases and screening for fall risk and other medical concerns.    This visit is provided free of charge (no copay) for all Medicare recipients. The clinical pharmacists at [facility] have begun to conduct these Wellness Visits which will also include a thorough review of all your medications.    As you primary medical provider recommend that you make an appointment for your Annual Wellness Visit if you have not done so already this year.  You may set up this appointment before you  leave today or you may call back ([phone]) and schedule an appointment.  Please make sure when you call that you mention that you are scheduling your Annual Wellness Visit with the clinical pharmacist so that the appointment may be made for the proper length of time.     Continue current medications. Continue good therapeutic lifestyle changes which include good diet and exercise. Fall precautions discussed with patient. If an FOBT was given today- please return it to our front desk. If you are over 33 years old - you may need Prevnar 69 or the adult Pneumonia vaccine.  Flu Shots are still available at our office. If you still haven't had one please call to set up a nurse visit to get one.   After your visit with [place] today you will receive a survey in the mail or online from [employer] regarding your care with  [place]. Please take a moment to fill this out. Your feedback is very important to [place] as you can help [place] better understand your patient needs as well as improve your experience and satisfaction. WE CARE ABOUT YOU!!!   Use Flonase 1-2 sprays each nostril at bedtime Use nasal saline during the day Try Claritin, Allegra, or Zyrtec, all of which are over-the-counter to see if this helps your allergy symptoms also Drink plenty of fluids Continue to monitor blood sugars at home   Arrie Senate MD

## 2014-09-13 NOTE — Patient Instructions (Addendum)
Medicare Annual Wellness Visit  [facility] and the medical providers at [facility] strive to bring you the best medical care.  In doing so we not only want to address your current medical conditions and concerns but also to detect new conditions early and prevent illness, disease and health-related problems.    Medicare offers a yearly Wellness Visit which allows our clinical staff to assess your need for preventative services including immunizations, lifestyle education, counseling to decrease risk of preventable diseases and screening for fall risk and other medical concerns.    This visit is provided free of charge (no copay) for all Medicare recipients. The clinical pharmacists at [facility] have begun to conduct these Wellness Visits which will also include a thorough review of all your medications.    As you primary medical provider recommend that you make an appointment for your Annual Wellness Visit if you have not done so already this year.  You may set up this appointment before you leave today or you may call back ([phone]) and schedule an appointment.  Please make sure when you call that you mention that you are scheduling your Annual Wellness Visit with the clinical pharmacist so that the appointment may be made for the proper length of time.     Continue current medications. Continue good therapeutic lifestyle changes which include good diet and exercise. Fall precautions discussed with patient. If an FOBT was given today- please return it to our front desk. If you are over 79 years old - you may need Prevnar 43 or the adult Pneumonia vaccine.  Flu Shots are still available at our office. If you still haven't had one please call to set up a nurse visit to get one.   After your visit with [place] today you will receive a survey in the mail or online from [employer] regarding your care with [place]. Please take a moment to  fill this out. Your feedback is very important to [place] as you can help [place] better understand your patient needs as well as improve your experience and satisfaction. WE CARE ABOUT YOU!!!   Use Flonase 1-2 sprays each nostril at bedtime Use nasal saline during the day Try Claritin, Allegra, or Zyrtec, all of which are over-the-counter to see if this helps your allergy symptoms also Drink plenty of fluids Continue to monitor blood sugars at home

## 2014-09-14 LAB — BMP8+EGFR
BUN/Creatinine Ratio: 16 (ref 10–22)
BUN: 13 mg/dL (ref 8–27)
CO2: 24 mmol/L (ref 18–29)
Calcium: 9.8 mg/dL (ref 8.6–10.2)
Chloride: 99 mmol/L (ref 97–108)
Creatinine, Ser: 0.83 mg/dL (ref 0.76–1.27)
GFR calc non Af Amer: 87 mL/min/{1.73_m2} (ref >59)
GFR, EST AFRICAN AMERICAN: 101 mL/min/{1.73_m2} (ref >59)
Glucose: 147 mg/dL — ABNORMAL HIGH (ref 65–99)
POTASSIUM: 4.1 mmol/L (ref 3.5–5.2)
Sodium: 139 mmol/L (ref 134–144)

## 2014-09-14 LAB — THYROID PANEL WITH TSH
Free Thyroxine Index: 2.7 (ref 1.2–4.9)
T3 Uptake Ratio: 29 % (ref 24–39)
T4, Total: 9.3 ug/dL (ref 4.5–12.0)
TSH: 1.51 u[IU]/mL (ref 0.450–4.500)

## 2014-09-14 LAB — NMR, LIPOPROFILE
Cholesterol: 114 mg/dL (ref 100–199)
HDL CHOLESTEROL BY NMR: 47 mg/dL (ref >39)
HDL Particle Number: 32.8 umol/L (ref >=30.5)
LDL Particle Number: 576 nmol/L (ref <1000)
LDL SIZE: 21.1 nm (ref >20.5)
LDL-C: 60 mg/dL (ref 0–99)
LP-IR SCORE: 47 — AB (ref <=45)
SMALL LDL PARTICLE NUMBER: 97 nmol/L (ref <=527)
TRIGLYCERIDES BY NMR: 36 mg/dL (ref 0–149)

## 2014-09-14 LAB — HEPATIC FUNCTION PANEL
ALT: 21 IU/L (ref 0–44)
AST: 14 IU/L (ref 0–40)
Albumin: 4.4 g/dL (ref 3.5–4.8)
Alkaline Phosphatase: 64 IU/L (ref 39–117)
BILIRUBIN, DIRECT: 0.15 mg/dL (ref 0.00–0.40)
Bilirubin Total: 0.4 mg/dL (ref 0.0–1.2)
Total Protein: 6.4 g/dL (ref 6.0–8.5)

## 2014-09-14 LAB — VITAMIN D 25 HYDROXY (VIT D DEFICIENCY, FRACTURES): Vit D, 25-Hydroxy: 42.1 ng/mL (ref 30.0–100.0)

## 2014-09-26 ENCOUNTER — Encounter: Payer: Self-pay | Admitting: Pharmacist

## 2014-09-26 ENCOUNTER — Ambulatory Visit (INDEPENDENT_AMBULATORY_CARE_PROVIDER_SITE_OTHER): Payer: Commercial Managed Care - HMO | Admitting: Pharmacist

## 2014-09-26 VITALS — BP 120/77 | HR 76 | Ht 70.0 in | Wt 172.0 lb

## 2014-09-26 DIAGNOSIS — E119 Type 2 diabetes mellitus without complications: Secondary | ICD-10-CM | POA: Diagnosis not present

## 2014-09-26 MED ORDER — INSULIN DEGLUDEC 100 UNIT/ML ~~LOC~~ SOPN: 50.0000 [IU] | PEN_INJECTOR | Freq: Every day | SUBCUTANEOUS | Status: DC

## 2014-09-26 NOTE — Patient Instructions (Signed)
Diabetes and Standards of Medical Care   Diabetes is complicated. You may find that your diabetes team includes a dietitian, nurse, diabetes educator, eye doctor, and more. To help everyone know what is going on and to help you get the care you deserve, the following schedule of care was developed to help keep you on track. Below are the tests, exams, vaccines, medicines, education, and plans you will need.  Blood Glucose Goals Prior to meals = 80 - 130 Within 2 hours of the start of a meal = less than 180  HbA1c test (goal is less than 7.0% - your last value was 9.7%) This test shows how well you have controlled your glucose over the past 2 to 3 months. It is used to see if your diabetes management plan needs to be adjusted.   It is performed at least 2 times a year if you are meeting treatment goals.  It is performed 4 times a year if therapy has changed or if you are not meeting treatment goals.  Blood pressure test  This test is performed at every routine medical visit. The goal is less than 140/90 mmHg for most people, but 130/80 mmHg in some cases. Ask your health care provider about your goal.  Dental exam  Follow up with the dentist regularly.  Eye exam  If you are diagnosed with type 1 diabetes as a child, get an exam upon reaching the age of 57 years or older and have had diabetes for 3 to 5 years. Yearly eye exams are recommended after that initial eye exam.  If you are diagnosed with type 1 diabetes as an adult, get an exam within 5 years of diagnosis and then yearly.  If you are diagnosed with type 2 diabetes, get an exam as soon as possible after the diagnosis and then yearly.  Foot care exam  Visual foot exams are performed at every routine medical visit. The exams check for cuts, injuries, or other problems with the feet.  A comprehensive foot exam should be done yearly. This includes visual inspection as well as assessing foot pulses and testing for loss of  sensation.  Check your feet nightly for cuts, injuries, or other problems with your feet. Tell your health care provider if anything is not healing.  Kidney function test (urine microalbumin)  This test is performed once a year.  Type 1 diabetes: The first test is performed 5 years after diagnosis.  Type 2 diabetes: The first test is performed at the time of diagnosis.  A serum creatinine and estimated glomerular filtration rate (eGFR) test is done once a year to assess the level of chronic kidney disease (CKD), if present.  Lipid profile (cholesterol, HDL, LDL, triglycerides)  Performed every 5 years for most people.  The goal for LDL is less than 100 mg/dL. If you are at high risk, the goal is less than 70 mg/dL.  The goal for HDL is 40 mg/dL to 50 mg/dL for men and 50 mg/dL to 60 mg/dL for women. An HDL cholesterol of 60 mg/dL or higher gives some protection against heart disease.  The goal for triglycerides is less than 150 mg/dL.  Influenza vaccine, pneumococcal vaccine, and hepatitis B vaccine  The influenza vaccine is recommended yearly.  The pneumococcal vaccine is generally given once in a lifetime. However, there are some instances when another vaccination is recommended. Check with your health care provider.  The hepatitis B vaccine is also recommended for adults with diabetes.  Diabetes self-management education  Education is recommended at diagnosis and ongoing as needed.  Treatment plan  Your treatment plan is reviewed at every medical visit.  Document Released: 01/31/2009 Document Revised: 12/06/2012 Document Reviewed: 09/05/2012 Exit[employer] Patient Information 2014 [employer].   

## 2014-09-26 NOTE — Progress Notes (Signed)
Subjective:    [name] [name] is a 73 y.o. male who presents for a follow-up evaluation of type 2 DM requiring insulin   His clinical course has worsened as indicated by an increase in A1c from 9.0% to 9.7%. Insulin dosage review with [name] [name] suggested compliance all of the time. Associated symptoms of hyperglycemia have been none.  Associated symptoms of hypoglycemia have been sweating. Patient reports awaking with sweats about 2 nights per week.  He has checked BG in past when this occurs and BG has been 80 to 100.  He is currently taking levemir 40 units qam and metformin 1000mg  bid units   Compliance with blood glucose monitoring: good.  The patient does perform independently. Exercise: rarely.  Up until 6 months ago [name] [name] was going to [facility] 3 times per week but has not been in several months.  Meal panning: He is using avoidance of concentrated sweets and high CHO containing foods.Marland Kitchen HBG readings from the last week - 146, 126, 110, 139, 104       MedicAlert Identification Noted? no   The following portions of the patient's history were reviewed and updated as appropriate: allergies, current medications, past family history, past medical history, past social history, past surgical history and problem list.  Objective:    BP 120/77 mmHg  Pulse 76  Ht 5\' 10"  (1.778 m)  Wt 172 lb (78.019 kg)  BMI 24.68 kg/m2  Lab Review Labs on site today:       random glucose = 96      hemoglobin A1C = 9.7% (rechecked today because HBG readings do not match up to last A1c    Assessment:    Diabetes Mellitus type 2 requiring insulin -  under inadequate control.    Plan:    1.  RX changes: gave patient #1 sample of Tresiba 100 units / mL to try - 40 units once a day 2.  Education:  blood sugar goals, hypoglycemia prevention and treatment, exercise, self-monitoring of blood glucose skills, nutrition and use of insulin pen 3.  Compliance at present is estimated to be excellent. Efforts to  improve compliance (if necessary) will be directed at increased exercise and I also instructed patient to check BG 1-2 hours after a meal at least 3 times per week. 4.  Follow up:  1 month.    Cherre Robins, PharmD, CPP

## 2014-11-04 ENCOUNTER — Encounter: Payer: Self-pay | Admitting: Pharmacist

## 2014-11-04 ENCOUNTER — Ambulatory Visit (INDEPENDENT_AMBULATORY_CARE_PROVIDER_SITE_OTHER): Payer: Commercial Managed Care - HMO | Admitting: Pharmacist

## 2014-11-04 VITALS — BP 130/82 | HR 78 | Ht 70.0 in | Wt 171.8 lb

## 2014-11-04 DIAGNOSIS — Z0000 Encounter for general adult medical examination without abnormal findings: Secondary | ICD-10-CM

## 2014-11-04 DIAGNOSIS — E1165 Type 2 diabetes mellitus with hyperglycemia: Secondary | ICD-10-CM

## 2014-11-04 NOTE — Progress Notes (Signed)
Patient ID: [name] [name], male   DOB: June 09, 1940, 74 y.o.   MRN: [id number]    Subjective:   [name] [name] is a 74 y.o. male who presents for a Subsequent  Medicare Annual Wellness Visit and to follow up diabetes.    [name] [name] was last seen about 4 weeks ago.  Switched to [place] 50 units once daily.  Patient did not see much change in BG readings and he has received a lot of Levemir from the patient assistance program with [facility].  He switched back to Levemir 50 units once daily.  HBG readings are - 50, 94, 89, 139, 136, 91, 132, 124, 102, 138, 108, 132, 86  Last A1c was 9.7%    Current Medications (verified) Outpatient Encounter Prescriptions as of 11/04/2014  Medication Sig  . aspirin 81 MG tablet Take 81 mg by mouth daily.    . Cholecalciferol (VITAMIN D3) 5000 UNITS CAPS Take 5,000 Units by mouth. Take 1 capsule three times per week  . [facility] CINNAMON PO Take 2,000 mg by mouth daily.    . Garlic 6063 MG CAPS Take by mouth daily.    . Insulin Detemir (LEVEMIR FLEXTOUCH) 100 UNIT/ML Pen Inject up to 50 units once daily  . levothyroxine (SYNTHROID, LEVOTHROID) 75 MCG tablet Take 1 tablet (75 mcg total) by mouth daily.  . metFORMIN (GLUCOPHAGE) 1000 MG tablet Take 1 tablet (1,000 mg total) by mouth 2 (two) times daily with a meal.  . quinapril (ACCUPRIL) 20 MG tablet Take 1 tablet (20 mg total) by mouth at bedtime.  . ranitidine (ZANTAC) 150 MG tablet Take 150 mg by mouth as needed.    . salicylic acid-lactic acid 17 % external solution   . UNABLE TO FIND Apply 1 application topically daily. Diabetic skin relief foot cream  . Camphor-Eucalyptus-Menthol (MEDICATED CHEST RUB) 4.73-1.2-2.6 % OINT   . fluticasone (FLONASE) 50 MCG/ACT nasal spray One to 2 sprays each nostril at bedtime  . [DISCONTINUED] Insulin Degludec (TRESIBA FLEXTOUCH) 100 UNIT/ML SOPN Inject 50 Units into the skin daily. Inject up to 50 units once a day (Patient not taking: Reported on 11/04/2014)   No  facility-administered encounter medications on file as of 11/04/2014.    Allergies (verified) Actos; Lisinopril; Invokana; Niaspan; and Ramipril   History: Past Medical History  Diagnosis Date  . Diabetes mellitus   . Liver hemangioma   . Gastroesophageal reflux disease with hiatal hernia   . Erectile dysfunction   . Goiter, nontoxic, multinodular     with macrocyst  . Diverticulitis   . Inguinal hernia     right side  . Kidney stone 1980  . Nephrolithiasis   . Leg fracture     Left  . Basal cell carcinoma     left forehead, nose, neck  . Hiatal hernia     With reflex  . Cataract   . Hyperlipidemia   . Vitamin D deficiency    Past Surgical History  Procedure Laterality Date  . Laceration repair  2008    Dr. Lenon Curt -to hand  . Back surgery  1984  . Tibia fracture surgery      left leg   Family History  Problem Relation Age of Onset  . Heart attack Sister     Sudden death in her 10s.  No clear as to the cause  . Heart disease Brother     Valvular  . Diabetes Other   . Heart disease Mother 47    stent,  lived to be 27  . Cancer Father     ?lung  . Early death Brother 3    spinal menigitis   Social History   Occupational History  . Retired    Social History Main Topics  . Smoking status: Former Smoker -- 1.00 packs/day for 27 years    Types: Cigarettes    Quit date: 04/20/1983  . Smokeless tobacco: Never Used     Comment: Quit 25 years ago  . Alcohol Use: No  . Drug Use: No  . Sexual Activity: Yes    Do you feel safe at home?  Yes  Dietary issues and exercise activities discussed: Current Exercise Habits:: Home exercise routine, Type of exercise: Other - see comments (golf), Time (Minutes): 60, Frequency (Times/Week): 1, Weekly Exercise (Minutes/Week): 60, Intensity: Mild  Current Dietary habits:  Tries to limit high CHO foods    Cardiac Risk Factors include: advanced age (>33men, >52 women);diabetes mellitus;family history of premature  cardiovascular disease;hypertension;male gender  Objective:    Today's Vitals   11/04/14 1021 11/04/14 1022  BP: 130/82   Pulse: 78   Height: 5\' 10"  (1.778 m)   Weight: 171 lb 12 oz (77.905 kg)   PainSc:  0-No pain   Body mass index is 24.64 kg/(m^2).   Activities of Daily Living In your present state of health, do you have any difficulty performing the following activities: 11/04/2014  Hearing? N  Vision? N  Difficulty concentrating or making decisions? N  Walking or climbing stairs? N  Dressing or bathing? N  Doing errands, shopping? N  Preparing Food and eating ? N  Using the Toilet? N  In the past six months, have you accidently leaked urine? N  Do you have problems with loss of bowel control? N  Managing your Medications? N  Managing your Finances? N  Housekeeping or managing your Housekeeping? N    Are there smokers in your home (other than you)? No    Depression Screen PHQ 2/9 Scores 11/04/2014 09/13/2014 05/01/2014 10/30/2013  PHQ - 2 Score 0 0 0 1    Fall Risk Fall Risk  11/04/2014 09/13/2014 05/01/2014 10/30/2013 10/16/2012  Falls in the past year? Yes No Yes No No  Number falls in past yr: 1 - 1 - -  Injury with Fall? No - No - -  Follow up Falls evaluation completed;Falls prevention discussed - - - -    Cognitive Function: MMSE - Mini Mental State Exam 11/04/2014  Orientation to time 5  Orientation to Place 5  Registration 3  Attention/ Calculation 5  Recall 3  Language- name 2 objects 2  Language- repeat 1  Language- follow 3 step command 3  Language- read & follow direction 1  Write a sentence 1  Copy design 1  Total score 30    Immunizations and Health Maintenance Immunization History  Administered Date(s) Administered  . Influenza Whole 01/17/2010  . Influenza,inj,Quad PF,36+ Mos 02/06/2014  . Influenza-Unspecified 02/17/2013  . Pneumococcal Conjugate-13 04/02/2013  . Pneumococcal Polysaccharide-23 02/18/2008  . Td 12/19/2006   There are  no preventive care reminders to display for this patient.  Patient Care Team: Chipper Herb, MD as PCP - General (Family Medicine) Calvert Cantor, MD as Consulting Physician (Ophthalmology)  Indicate any recent Medical Services you may have received from other than [facility] providers in the past year (date may be approximate).    Assessment:    Annual Wellness Visit  Type 2 DM - according to HBG  reading BG is well controlled with the exception of occassional hypoglycemia   Screening Tests Health Maintenance  Topic Date Due  . INFLUENZA VACCINE  11/18/2014  . URINE MICROALBUMIN  12/14/2014  . OPHTHALMOLOGY EXAM  01/18/2015  . HEMOGLOBIN A1C  03/16/2015  . COLON CANCER SCREENING ANNUAL FOBT  04/20/2015  . FOOT EXAM  09/13/2015  . TETANUS/TDAP  12/18/2016  . COLONOSCOPY  11/26/2020  . ZOSTAVAX  Completed  . PNA vac Low Risk Adult  Completed        Plan:   During the course of the visit [name] [name] was educated and counseled about the following appropriate screening and preventive services:   Vaccines to include Pneumoccal, Influenza, Hepatitis B, Td, Zostavax - all required vaccines UTD at present  Colorectal cancer screening - FOCT and colonoscopy UTD  Cardiovascular disease screening - UTD.  BP at goal and lipids at goals.  Last EKG 05/2014  Glaucoma screening / Diabetic Eye Exam - last done by Dr Bing Plume - requesting copy of visit.  Nutrition counseling - continue to limit CHO intake.  Prostate cancer screening - UTD  Continue with current physical activity  Continue ti Levemir 50 units daily.  Discussed fall prevention and information given  Patient Instructions (the written plan) were given to the patient.   Cherre Robins, [facility]   11/04/2014

## 2014-11-04 NOTE — Patient Instructions (Signed)
[name] , Thank you for taking time to come for your Medicare Wellness Visit. I appreciate your ongoing commitment to your health goals. Please review the following plan we discussed and let me know if I can assist you in the future.     This is a list of the screening recommended for you and due dates:  Health Maintenance  Topic Date Due  . Flu Shot  11/18/2014  . Urine Protein Check  12/14/2014  . Eye exam for diabetics  01/18/2015  . Hemoglobin A1C  03/16/2015  . Stool Blood Test  04/20/2015  . Complete foot exam   09/13/2015  . Tetanus Vaccine  12/18/2016  . Colon Cancer Screening  11/26/2020  . Shingles Vaccine  Completed  . Pneumonia vaccines  Completed     Fall Prevention and Home Safety Falls cause injuries and can affect all age groups. It is possible to use preventive measures to significantly decrease the likelihood of falls. There are many simple measures which can make your home safer and prevent falls. OUTDOORS  Repair cracks and edges of walkways and driveways.  Remove high doorway thresholds.  Trim shrubbery on the main path into your home.  Have good outside lighting.  Clear walkways of tools, rocks, debris, and clutter.  Check that handrails are not broken and are securely fastened. Both sides of steps should have handrails.  Have leaves, snow, and ice cleared regularly.  Use sand or salt on walkways during winter months.  In the garage, clean up grease or oil spills. BATHROOM  Install night lights.  Install grab bars by the toilet and in the tub and shower.  Use non-skid mats or decals in the tub or shower.  Place a plastic non-slip stool in the shower to sit on, if needed.  Keep floors dry and clean up all water on the floor immediately.  Remove soap buildup in the tub or shower on a regular basis.  Secure bath mats with non-slip, double-sided rug tape.  Remove throw rugs and tripping hazards from the floors. BEDROOMS  Install  night lights.  Make sure a bedside light is easy to reach.  Do not use oversized bedding.  Keep a telephone by your bedside.  Have a firm chair with side arms to use for getting dressed.  Remove throw rugs and tripping hazards from the floor. KITCHEN  Keep handles on pots and pans turned toward the center of the stove. Use back burners when possible.  Clean up spills quickly and allow time for drying.  Avoid walking on wet floors.  Avoid hot utensils and knives.  Position shelves so they are not too high or low.  Place commonly used objects within easy reach.  If necessary, use a sturdy step stool with a grab bar when reaching.  Keep electrical cables out of the way.  Do not use floor polish or wax that makes floors slippery. If you must use wax, use non-skid floor wax.  Remove throw rugs and tripping hazards from the floor. STAIRWAYS  Never leave objects on stairs.  Place handrails on both sides of stairways and use them. Fix any loose handrails. Make sure handrails on both sides of the stairways are as long as the stairs.  Check carpeting to make sure it is firmly attached along stairs. Make repairs to worn or loose carpet promptly.  Avoid placing throw rugs at the top or bottom of stairways, or properly secure the rug with carpet tape to prevent slippage. Get  rid of throw rugs, if possible.  Have an electrician put in a light switch at the top and bottom of the stairs. OTHER FALL PREVENTION TIPS  Wear low-heel or rubber-soled shoes that are supportive and fit well. Wear closed toe shoes.  When using a stepladder, make sure it is fully opened and both spreaders are firmly locked. Do not climb a closed stepladder.  Add color or contrast paint or tape to grab bars and handrails in your home. Place contrasting color strips on first and last steps.  Learn and use mobility aids as needed. Install an electrical emergency response system.  Turn on lights to avoid dark  areas. Replace light bulbs that burn out immediately. Get light switches that glow.  Arrange furniture to create clear pathways. Keep furniture in the same place.  Firmly attach carpet with non-skid or double-sided tape.  Eliminate uneven floor surfaces.  Select a carpet pattern that does not visually hide the edge of steps.  Be aware of all pets. OTHER HOME SAFETY TIPS  Set the water temperature for 120 F (48.8 C).  Keep emergency numbers on or near the telephone.  Keep smoke detectors on every level of the home and near sleeping areas. Document Released: 03/26/2002 Document Revised: 10/05/2011 Document Reviewed: 06/25/2011 [facility] Patient Information 2015 [place], [place]. This information is not intended to replace advice given to you by your health care provider. Make sure you discuss any questions you have with your health care provider.

## 2014-11-06 LAB — MICROALBUMIN / CREATININE URINE RATIO
Creatinine, Urine: 29.7 mg/dL
MICROALB/CREAT RATIO: <10.1 mg/g creat (ref 0.0–30.0)
Microalbumin, Urine: <3 ug/mL

## 2014-11-28 DIAGNOSIS — H25813 Combined forms of age-related cataract, bilateral: Secondary | ICD-10-CM | POA: Diagnosis not present

## 2014-11-28 DIAGNOSIS — E119 Type 2 diabetes mellitus without complications: Secondary | ICD-10-CM | POA: Diagnosis not present

## 2014-12-16 ENCOUNTER — Other Ambulatory Visit: Payer: Commercial Managed Care - HMO

## 2014-12-16 DIAGNOSIS — E1165 Type 2 diabetes mellitus with hyperglycemia: Secondary | ICD-10-CM

## 2014-12-17 LAB — HEMOGLOBIN A1C
Est. average glucose Bld gHb Est-mCnc: 217 mg/dL
Hgb A1c MFr Bld: 9.2 % — ABNORMAL HIGH (ref 4.8–5.6)

## 2015-01-27 ENCOUNTER — Encounter: Payer: Self-pay | Admitting: *Deleted

## 2015-01-31 ENCOUNTER — Telehealth: Payer: Self-pay | Admitting: Family Medicine

## 2015-01-31 ENCOUNTER — Ambulatory Visit (INDEPENDENT_AMBULATORY_CARE_PROVIDER_SITE_OTHER): Payer: Commercial Managed Care - HMO | Admitting: Family Medicine

## 2015-01-31 ENCOUNTER — Encounter: Payer: Self-pay | Admitting: Family Medicine

## 2015-01-31 VITALS — BP 153/83 | HR 65 | Temp 97.0°F | Ht 70.0 in | Wt 168.0 lb

## 2015-01-31 DIAGNOSIS — E782 Mixed hyperlipidemia: Secondary | ICD-10-CM | POA: Diagnosis not present

## 2015-01-31 DIAGNOSIS — Z23 Encounter for immunization: Secondary | ICD-10-CM | POA: Diagnosis not present

## 2015-01-31 DIAGNOSIS — E1165 Type 2 diabetes mellitus with hyperglycemia: Secondary | ICD-10-CM | POA: Diagnosis not present

## 2015-01-31 DIAGNOSIS — E039 Hypothyroidism, unspecified: Secondary | ICD-10-CM

## 2015-01-31 DIAGNOSIS — N400 Enlarged prostate without lower urinary tract symptoms: Secondary | ICD-10-CM | POA: Diagnosis not present

## 2015-01-31 DIAGNOSIS — Z8249 Family history of ischemic heart disease and other diseases of the circulatory system: Secondary | ICD-10-CM | POA: Diagnosis not present

## 2015-01-31 DIAGNOSIS — I10 Essential (primary) hypertension: Secondary | ICD-10-CM | POA: Diagnosis not present

## 2015-01-31 DIAGNOSIS — E559 Vitamin D deficiency, unspecified: Secondary | ICD-10-CM | POA: Diagnosis not present

## 2015-01-31 LAB — POCT GLYCOSYLATED HEMOGLOBIN (HGB A1C): Hemoglobin A1C: 8.9

## 2015-01-31 LAB — POCT URINALYSIS DIPSTICK
BILIRUBIN UA: NEGATIVE
Glucose, UA: NEGATIVE
KETONES UA: NEGATIVE
Leukocytes, UA: NEGATIVE
Nitrite, UA: NEGATIVE
PH UA: 6
RBC UA: NEGATIVE
SPEC GRAV UA: 1.015
Urobilinogen, UA: NEGATIVE

## 2015-01-31 LAB — POCT UA - MICROSCOPIC ONLY
BACTERIA, U MICROSCOPIC: NEGATIVE
CASTS, UR, LPF, POC: NEGATIVE
CRYSTALS, UR, HPF, POC: NEGATIVE
Epithelial cells, urine per micros: NEGATIVE
RBC, URINE, MICROSCOPIC: NEGATIVE
WBC, UR, HPF, POC: NEGATIVE
YEAST UA: NEGATIVE

## 2015-01-31 MED ORDER — GLUCOSE BLOOD VI STRP: ORAL_STRIP | Status: DC

## 2015-01-31 NOTE — Addendum Note (Signed)
Addended by: Zannie Cove on: 01/31/2015 04:19 PM   Modules accepted: Orders

## 2015-01-31 NOTE — Telephone Encounter (Signed)
Give to Floyd B, she know about it

## 2015-01-31 NOTE — Progress Notes (Signed)
Subjective:    Patient ID: [name] [name], male    DOB: 1940/05/11, 74 y.o.   MRN: [id number]  HPI Pt here for follow up and management of chronic medical problems which includes diabetes, hyperlipidemia, and hypertension. He is taking medications regularly. The patient today has no specific complaints. His initial blood pressure is elevated and we will recheck this during the visit. He is due for lab work and will get his flu shot today. He brings in blood sugars for review and all of these appear to be very good. These will be scanned into the record. The patient has increased his insulin to 45 units with the help of the clinical pharmacists who is a certified diabetic educator. The patient denies chest pain or shortness of breath. He is swallowing okay with no heartburn indigestion nausea vomiting or diarrhea. He denies blood in the stool or black tarry bowel movements. He is passing his water without problems. He is seeing the ophthalmologist regularly and this is about every 6 months. He does have some developing cataracts bilaterally. The next appointment is in March and he has seen the eye doctor recently.       Patient Active Problem List   Diagnosis Date Noted  . Right inguinal hernia 12/13/2013  . Essential hypertension 12/13/2013  . BPH (benign prostatic hyperplasia) 12/13/2013  . Hypothyroidism 08/07/2013  . Vitamin D deficiency 04/02/2013  . Goiter, nontoxic, multinodular   . Kidney stone   . Hyperlipidemia 01/15/2009  . ABNORMAL ELECTROCARDIOGRAM 01/15/2009  . Type 2 diabetes mellitus treated with insulin ([place]) 01/09/2009  . HIATAL HERNIA 01/09/2009  . BASAL CELL CARCINOMA, HX OF 01/09/2009  . DIVERTICULITIS, HX OF 01/09/2009   Outpatient Encounter Prescriptions as of 01/31/2015  Medication Sig  . aspirin 81 MG tablet Take 81 mg by mouth daily.    . Camphor-Eucalyptus-Menthol (MEDICATED CHEST RUB) 4.73-1.2-2.6 % OINT   . Cholecalciferol (VITAMIN D3) 5000 UNITS CAPS  Take 5,000 Units by mouth. Take 1 capsule three times per week  . CVS CINNAMON PO Take 2,000 mg by mouth daily.    . fluticasone (FLONASE) 50 MCG/ACT nasal spray One to 2 sprays each nostril at bedtime  . Garlic 4128 MG CAPS Take by mouth daily.    . Insulin Detemir (LEVEMIR FLEXTOUCH) 100 UNIT/ML Pen Inject up to 50 units once daily  . levothyroxine (SYNTHROID, LEVOTHROID) 75 MCG tablet Take 1 tablet (75 mcg total) by mouth daily.  . metFORMIN (GLUCOPHAGE) 1000 MG tablet Take 1 tablet (1,000 mg total) by mouth 2 (two) times daily with a meal.  . quinapril (ACCUPRIL) 20 MG tablet Take 1 tablet (20 mg total) by mouth at bedtime.  . ranitidine (ZANTAC) 150 MG tablet Take 150 mg by mouth as needed.    . salicylic acid-lactic acid 17 % external solution   . UNABLE TO FIND Apply 1 application topically daily. Diabetic skin relief foot cream   No facility-administered encounter medications on file as of 01/31/2015.      Review of Systems  Constitutional: Negative.   HENT: Negative.   Eyes: Negative.   Respiratory: Negative.   Cardiovascular: Negative.   Gastrointestinal: Negative.   Endocrine: Negative.   Genitourinary: Negative.   Musculoskeletal: Negative.   Skin: Negative.   Allergic/Immunologic: Negative.   Neurological: Negative.   Hematological: Negative.   Psychiatric/Behavioral: Negative.        Objective:   Physical Exam  Constitutional: He is oriented to person, place, and time. He appears well-developed  and well-nourished. No distress.  HENT:  Head: Normocephalic and atraumatic.  Right Ear: External ear normal.  Left Ear: External ear normal.  Nose: Nose normal.  Mouth/Throat: Oropharynx is clear and moist. No oropharyngeal exudate.  Eyes: Conjunctivae and EOM are normal. Pupils are equal, round, and reactive to light. Right eye exhibits no discharge. Left eye exhibits no discharge. No scleral icterus.  Neck: Normal range of motion. Neck supple. No thyromegaly  present.  No bruits thyromegaly or anterior cervical adenopathy  Cardiovascular: Normal rate, regular rhythm, normal heart sounds and intact distal pulses.   No murmur heard. At 72/m  Pulmonary/Chest: Effort normal and breath sounds normal. No respiratory distress. He has no wheezes. He has no rales. He exhibits no tenderness.  Good breath sounds anteriorly and posteriorly and no axillary adenopathy  Abdominal: Soft. Bowel sounds are normal. He exhibits no mass. There is no tenderness. There is no rebound and no guarding.  No liver or spleen enlargement and no inguinal adenopathy and no abdominal bruits  Genitourinary: Rectum normal and penis normal.  The prostate is slightly enlarged and smooth to touch without lumps or masses. There are no rectal masses. There were no inguinal hernias or inguinal adenopathy. External genitalia were within normal limits.  Musculoskeletal: Normal range of motion. He exhibits no edema or tenderness.  Lymphadenopathy:    He has no cervical adenopathy.  Neurological: He is alert and oriented to person, place, and time. He has normal reflexes. No cranial nerve deficit.  Skin: Skin is warm and dry. No rash noted. No erythema. No pallor.  Psychiatric: He has a normal mood and affect. His behavior is normal. Judgment and thought content normal.  Nursing note and vitals reviewed.  BP 153/83 mmHg  Pulse 65  Temp(Src) 97 F (36.1 C) (Oral)  Ht '5\' 10"'  (1.778 m)  Wt 168 lb (76.204 kg)  BMI 24.11 kg/m2        Assessment & Plan:  1. Type 2 diabetes mellitus with hyperglycemia, without long-term current use of insulin (HCC) -Continue to monitor blood sugars closely and record these for our review -Stay active physically and watch the diet closely - POCT glycosylated hemoglobin (Hb A1C) - CBC with Differential/Platelet  2. Hyperlipidemia -Continue with current treatment of garlic and aggressive therapeutic lifestyle changes - CBC with Differential/Platelet -  NMR, lipoprofile  3. Hypothyroidism, unspecified hypothyroidism type -Continue with current treatment pending results of lab work - CBC with Differential/Platelet  4. Vitamin D deficiency -Continue with current treatment pending results of lab work - CBC with Differential/Platelet - Vit D  25 hydroxy (rtn osteoporosis monitoring)  5. Essential hypertension -Blood pressure somewhat elevated today but home readings have been in the 130s over the 70-80 range according to the patient. - BMP8+EGFR - CBC with Differential/Platelet - Hepatic function panel  6. Family history of heart disease -The patient does have a family history of heart disease and he denies any symptoms associated with this. - CBC with Differential/Platelet  7. BPH (benign prostatic hyperplasia) -He is voiding well having no problems. - CBC with Differential/Platelet - PSA - POCT UA - Microscopic Only - POCT urinalysis dipstick  No orders of the defined types were placed in this encounter.   Patient Instructions  Pharm   Continue current medications. Continue good therapeutic lifestyle changes which include good diet and exercise. Fall precautions discussed with patient. If an FOBT was given today- please return it to our front desk. If you are over 69 years old -  you may need Prevnar 13 or the adult Pneumonia vaccine.  **Flu shots will be available soon--- please call and schedule a FLU-CLINIC appointment**  After your visit with [place] today you will receive a survey in the mail or online from [employer] regarding your care with [place]. Please take a moment to fill this out. Your feedback is very important to [place] as you can help [place] better understand your patient needs as well as improve your experience and satisfaction. WE CARE ABOUT YOU!!!    The flu shot that you received today may make you feel a little bit achy and make your arm sore so be aware of that. Continue to monitor your blood sugars and check your  feet regularly Also check blood pressures at home and bring these readings with you to future visits This winter drink plenty of fluids and keep the house as cool as possible and use nasal saline and Mucinex as needed for cough and congestion   Arrie Senate MD

## 2015-01-31 NOTE — Patient Instructions (Addendum)
Pharm   Continue current medications. Continue good therapeutic lifestyle changes which include good diet and exercise. Fall precautions discussed with patient. If an FOBT was given today- please return it to our front desk. If you are over 74 years old - you may need Prevnar 61 or the adult Pneumonia vaccine.  **Flu shots will be available soon--- please call and schedule a FLU-CLINIC appointment**  After your visit with [place] today you will receive a survey in the mail or online from [employer] regarding your care with [place]. Please take a moment to fill this out. Your feedback is very important to [place] as you can help [place] better understand your patient needs as well as improve your experience and satisfaction. WE CARE ABOUT YOU!!!    The flu shot that you received today may make you feel a little bit achy and make your arm sore so be aware of that. Continue to monitor your blood sugars and check your feet regularly Also check blood pressures at home and bring these readings with you to future visits This winter drink plenty of fluids and keep the house as cool as possible and use nasal saline and Mucinex as needed for cough and congestion

## 2015-02-01 LAB — BMP8+EGFR
BUN / CREAT RATIO: 18 (ref 10–22)
BUN: 14 mg/dL (ref 8–27)
CO2: 24 mmol/L (ref 18–29)
Calcium: 9.6 mg/dL (ref 8.6–10.2)
Chloride: 99 mmol/L (ref 97–108)
Creatinine, Ser: 0.8 mg/dL (ref 0.76–1.27)
GFR calc Af Amer: 102 mL/min/{1.73_m2} (ref >59)
GFR calc non Af Amer: 88 mL/min/{1.73_m2} (ref >59)
GLUCOSE: 179 mg/dL — AB (ref 65–99)
POTASSIUM: 4.7 mmol/L (ref 3.5–5.2)
Sodium: 139 mmol/L (ref 134–144)

## 2015-02-01 LAB — CBC WITH DIFFERENTIAL/PLATELET
BASOS ABS: 0 10*3/uL (ref 0.0–0.2)
BASOS: 1 %
EOS (ABSOLUTE): 0.1 10*3/uL (ref 0.0–0.4)
Eos: 1 %
HEMATOCRIT: 44.6 % (ref 37.5–51.0)
HEMOGLOBIN: 14.7 g/dL (ref 12.6–17.7)
IMMATURE GRANS (ABS): 0 10*3/uL (ref 0.0–0.1)
Immature Granulocytes: 0 %
Lymphocytes Absolute: 1.6 10*3/uL (ref 0.7–3.1)
Lymphs: 22 %
MCH: 27.5 pg (ref 26.6–33.0)
MCHC: 33 g/dL (ref 31.5–35.7)
MCV: 84 fL (ref 79–97)
MONOS ABS: 0.6 10*3/uL (ref 0.1–0.9)
Monocytes: 8 %
NEUTROS ABS: 5.1 10*3/uL (ref 1.4–7.0)
Neutrophils: 68 %
Platelets: 252 10*3/uL (ref 150–379)
RBC: 5.34 x10E6/uL (ref 4.14–5.80)
RDW: 14 % (ref 12.3–15.4)
WBC: 7.4 10*3/uL (ref 3.4–10.8)

## 2015-02-01 LAB — HEPATIC FUNCTION PANEL
ALK PHOS: 77 IU/L (ref 39–117)
ALT: 20 IU/L (ref 0–44)
AST: 15 IU/L (ref 0–40)
Albumin: 4.6 g/dL (ref 3.5–4.8)
BILIRUBIN, DIRECT: 0.1 mg/dL (ref 0.00–0.40)
Bilirubin Total: 0.3 mg/dL (ref 0.0–1.2)
Total Protein: 6.7 g/dL (ref 6.0–8.5)

## 2015-02-01 LAB — PSA: Prostate Specific Ag, Serum: 3.2 ng/mL (ref 0.0–4.0)

## 2015-02-01 LAB — NMR, LIPOPROFILE
Cholesterol: 118 mg/dL (ref 100–199)
HDL Cholesterol by NMR: 41 mg/dL (ref >39)
HDL Particle Number: 32.7 umol/L (ref >=30.5)
LDL PARTICLE NUMBER: 564 nmol/L (ref <1000)
LDL SIZE: 21.1 nm (ref >20.5)
LDL-C: 65 mg/dL (ref 0–99)
LP-IR SCORE: 51 — AB (ref <=45)
Small LDL Particle Number: <90 nmol/L (ref <=527)
Triglycerides by NMR: 61 mg/dL (ref 0–149)

## 2015-02-01 LAB — HGB A1C W/O EAG: Hgb A1c MFr Bld: 9.6 % — ABNORMAL HIGH (ref 4.8–5.6)

## 2015-02-01 LAB — VITAMIN D 25 HYDROXY (VIT D DEFICIENCY, FRACTURES): VIT D 25 HYDROXY: 44.7 ng/mL (ref 30.0–100.0)

## 2015-02-28 ENCOUNTER — Telehealth: Payer: Self-pay | Admitting: Family Medicine

## 2015-02-28 MED ORDER — GLUCOSE BLOOD VI STRP: ORAL_STRIP | Status: DC

## 2015-02-28 NOTE — Telephone Encounter (Signed)
Pt aware that request has been sent

## 2015-05-14 ENCOUNTER — Encounter: Payer: Self-pay | Admitting: Gastroenterology

## 2015-06-11 ENCOUNTER — Telehealth: Payer: Self-pay | Admitting: Family Medicine

## 2015-06-19 DIAGNOSIS — H25813 Combined forms of age-related cataract, bilateral: Secondary | ICD-10-CM | POA: Diagnosis not present

## 2015-06-19 DIAGNOSIS — H524 Presbyopia: Secondary | ICD-10-CM | POA: Diagnosis not present

## 2015-06-25 ENCOUNTER — Ambulatory Visit (INDEPENDENT_AMBULATORY_CARE_PROVIDER_SITE_OTHER): Payer: Commercial Managed Care - HMO

## 2015-06-25 ENCOUNTER — Encounter: Payer: Self-pay | Admitting: Family Medicine

## 2015-06-25 ENCOUNTER — Ambulatory Visit (INDEPENDENT_AMBULATORY_CARE_PROVIDER_SITE_OTHER): Payer: Commercial Managed Care - HMO | Admitting: Family Medicine

## 2015-06-25 VITALS — BP 138/62 | HR 69 | Temp 97.0°F | Ht 70.0 in | Wt 173.2 lb

## 2015-06-25 DIAGNOSIS — E039 Hypothyroidism, unspecified: Secondary | ICD-10-CM

## 2015-06-25 DIAGNOSIS — E782 Mixed hyperlipidemia: Secondary | ICD-10-CM | POA: Diagnosis not present

## 2015-06-25 DIAGNOSIS — E1165 Type 2 diabetes mellitus with hyperglycemia: Secondary | ICD-10-CM

## 2015-06-25 DIAGNOSIS — M25512 Pain in left shoulder: Secondary | ICD-10-CM | POA: Diagnosis not present

## 2015-06-25 DIAGNOSIS — M542 Cervicalgia: Secondary | ICD-10-CM

## 2015-06-25 DIAGNOSIS — E119 Type 2 diabetes mellitus without complications: Secondary | ICD-10-CM

## 2015-06-25 DIAGNOSIS — I10 Essential (primary) hypertension: Secondary | ICD-10-CM

## 2015-06-25 DIAGNOSIS — E559 Vitamin D deficiency, unspecified: Secondary | ICD-10-CM

## 2015-06-25 DIAGNOSIS — M25511 Pain in right shoulder: Secondary | ICD-10-CM

## 2015-06-25 DIAGNOSIS — Z794 Long term (current) use of insulin: Secondary | ICD-10-CM

## 2015-06-25 LAB — BAYER DCA HB A1C WAIVED: HB A1C (BAYER DCA - WAIVED): 10.5 % — ABNORMAL HIGH (ref <7.0)

## 2015-06-25 MED ORDER — LEVOTHYROXINE SODIUM 75 MCG PO TABS: 75.0000 ug | ORAL_TABLET | Freq: Every day | ORAL | Status: DC

## 2015-06-25 MED ORDER — INSULIN DETEMIR 100 UNIT/ML FLEXPEN: PEN_INJECTOR | SUBCUTANEOUS | Status: DC

## 2015-06-25 MED ORDER — QUINAPRIL HCL 20 MG PO TABS: 20.0000 mg | ORAL_TABLET | Freq: Every day | ORAL | Status: DC

## 2015-06-25 MED ORDER — RANITIDINE HCL 150 MG PO TABS: 150.0000 mg | ORAL_TABLET | ORAL | Status: DC | PRN

## 2015-06-25 MED ORDER — METFORMIN HCL 1000 MG PO TABS: 1000.0000 mg | ORAL_TABLET | Freq: Two times a day (BID) | ORAL | Status: DC

## 2015-06-25 NOTE — Patient Instructions (Addendum)
We will call with the results of the lab work and a chest x-ray and cervical spine x-ray as soon as those results become available Continue to use warm wet compresses to neck and for the time being extra strength Tylenol The patient could also try salonpas We may need to get an MRI of the neck and if we do he should remind [place] that he has to have an open MRI. You may take 1 aleve a day to help with neck and shoulder pain   Continue current medications. Continue good therapeutic lifestyle changes which include good diet and exercise. Fall precautions discussed with patient. If an FOBT was given today- please return it to our front desk. If you are over 10 years old - you may need Prevnar 34 or the adult Pneumonia vaccine.  Flu Shots will be available at our office starting mid- September. Please call and schedule a FLU CLINIC APPOINTMENT.                        Medicare Annual Wellness Visit  [facility] and the medical providers at [facility] strive to bring you the best medical care.  In doing so we not only want to address your current medical conditions and concerns but also to detect new conditions early and prevent illness, disease and health-related problems.    Medicare offers a yearly Wellness Visit which allows our clinical staff to assess your need for preventative services including immunizations, lifestyle education, counseling to decrease risk of preventable diseases and screening for fall risk and other medical concerns.    This visit is provided free of charge (no copay) for all Medicare recipients. The clinical pharmacists at [facility] have begun to conduct these Wellness Visits which will also include a thorough review of all your medications.    As you primary medical provider recommend that you make an appointment for your Annual Wellness Visit if you have not done so already this year.  You may set up this appointment before  you leave today or you may call back [phone] and schedule an appointment.  Please make sure when you call that you mention that you are scheduling your Annual Wellness Visit with the clinical pharmacist so that the appointment may be made for the proper length of time.

## 2015-06-25 NOTE — Progress Notes (Signed)
Subjective:    Patient ID: [name] [name], male    DOB: 28-Mar-1941, 75 y.o.   MRN: [id number]  HPI Patient is here today for a follow up on his chronic medical problems. He states that he is having bilateral shoulder pain. The shoulder pain is been going on for 6 months off and on. It is especially worse at night time and he has to reposition himself in the bed from side to side to reduce the frequency of the pain. It is not bothering him that much during the day. He continues to change salt play golf and has no problems. The pain is mostly in the deltoid muscles bilaterally and occasional have a catch in the neck. He denies any chest pain shortness of breath trouble swallowing heartburn and indigestion nausea vomiting diarrhea or blood in the stool or black tarry bowel movements or abdominal pain. He is passing his water without problems. He brings in blood sugars for review and overall these are good and will be scanned into the record. They do appear better over the past 3-4 weeks and she is been leaving off the fish oil. He is taking the fish oil for joint pain.   Review of Systems  Constitutional: Negative.   HENT: Negative.   Eyes: Negative.   Respiratory: Negative.   Cardiovascular: Negative.   Gastrointestinal: Negative.   Endocrine: Negative.   Genitourinary: Negative.   Musculoskeletal:       Bilateral shoulder pain   Skin: Negative.   Allergic/Immunologic: Negative.   Neurological: Negative.   Hematological: Negative.   Psychiatric/Behavioral: Negative.             Patient Active Problem List   Diagnosis Date Noted  . Right inguinal hernia 12/13/2013  . Essential hypertension 12/13/2013  . BPH (benign prostatic hyperplasia) 12/13/2013  . Hypothyroidism 08/07/2013  . Vitamin D deficiency 04/02/2013  . Goiter, nontoxic, multinodular   . Kidney stone   . Hyperlipidemia 01/15/2009  . ABNORMAL ELECTROCARDIOGRAM 01/15/2009  . Type 2 diabetes mellitus treated with  insulin ([place]) 01/09/2009  . HIATAL HERNIA 01/09/2009  . BASAL CELL CARCINOMA, HX OF 01/09/2009  . DIVERTICULITIS, HX OF 01/09/2009   Outpatient Encounter Prescriptions as of 06/25/2015  Medication Sig  . aspirin 81 MG tablet Take 81 mg by mouth daily.    . Camphor-Eucalyptus-Menthol (MEDICATED CHEST RUB) 4.73-1.2-2.6 % OINT   . Cholecalciferol (VITAMIN D3) 5000 UNITS CAPS Take 5,000 Units by mouth. Take 1 capsule three times per week  . CVS CINNAMON PO Take 2,000 mg by mouth daily.    . fluticasone (FLONASE) 50 MCG/ACT nasal spray One to 2 sprays each nostril at bedtime  . Garlic 4496 MG CAPS Take by mouth daily.    Marland Kitchen glucose blood test strip Check BS TID and [id number]  . Insulin Detemir (LEVEMIR FLEXTOUCH) 100 UNIT/ML Pen Inject up to 50 units once daily  . levothyroxine (SYNTHROID, LEVOTHROID) 75 MCG tablet Take 1 tablet (75 mcg total) by mouth daily.  . metFORMIN (GLUCOPHAGE) 1000 MG tablet Take 1 tablet (1,000 mg total) by mouth 2 (two) times daily with a meal.  . quinapril (ACCUPRIL) 20 MG tablet Take 1 tablet (20 mg total) by mouth at bedtime.  . ranitidine (ZANTAC) 150 MG tablet Take 150 mg by mouth as needed.    Marland Kitchen UNABLE TO FIND Apply 1 application topically daily. Diabetic skin relief foot cream  . [DISCONTINUED] salicylic acid-lactic acid 17 % external solution    No facility-administered encounter  medications on file as of 06/25/2015.     Objective:   Physical Exam  Constitutional: He is oriented to person, place, and time. He appears well-developed and well-nourished. No distress.  HENT:  Head: Normocephalic and atraumatic.  Right Ear: External ear normal.  Left Ear: External ear normal.  Nose: Nose normal.  Mouth/Throat: Oropharynx is clear and moist. No oropharyngeal exudate.  Eyes: Conjunctivae and EOM are normal. Pupils are equal, round, and reactive to light. Right eye exhibits no discharge. Left eye exhibits no discharge. No scleral icterus.  Neck: Normal range of  motion. Neck supple. No thyromegaly present.  Neck without bruits thyromegaly or adenopathy  Cardiovascular: Normal rate, regular rhythm, normal heart sounds and intact distal pulses.   No murmur heard. At 60/m with a regular rate and rhythm  Pulmonary/Chest: Effort normal and breath sounds normal. No respiratory distress. He has no wheezes. He has no rales. He exhibits no tenderness.  Clear anteriorly and posteriorly with no axillary adenopathy  Abdominal: Soft. Bowel sounds are normal. He exhibits no mass. There is no tenderness. There is no rebound and no guarding.  Soft without masses tenderness or organ enlargement and no inguinal adenopathy  Musculoskeletal: Normal range of motion. He exhibits no edema.  The patient has good range of motion of his neck without reproduction of pain in his arms.  Lymphadenopathy:    He has no cervical adenopathy.  Neurological: He is alert and oriented to person, place, and time. He has normal reflexes. No cranial nerve deficit.  Skin: Skin is warm and dry. No rash noted.  Psychiatric: He has a normal mood and affect. His behavior is normal. Judgment and thought content normal.  Nursing note and vitals reviewed.  BP 138/62 mmHg  Pulse 69  Temp(Src) 97 F (36.1 C) (Oral)  Ht '5\' 10"'  (1.778 m)  Wt 173 lb 3.2 oz (78.563 kg)  BMI 24.85 kg/m2  [facility] reading (PRIMARY) by  Dr. Brunilda Payor x-ray and C-spine-                                      Assessment & Plan:  1. Hyperlipidemia -Continue current treatment and aggressive therapeutic lifestyle changes pending results of lab work - Hepatic function panel - NMR, lipoprofile  2. Hypothyroidism, unspecified hypothyroidism type -Continue current treatment pending results of lab work - Thyroid Panel With TSH  3. Type 2 diabetes mellitus with hyperglycemia, without long-term current use of insulin (HCC) -Since he has seen elevated blood sugar readings with omega-3 fatty acids, he should leave these  off and hopefully this will make a difference for getting better blood sugar control. We will call him with the results of the lab work as soon as it becomes available - Bayer DCA Hb A1c Waived  4. Vitamin D deficiency -Continue current treatment pending results of lab work - VITAMIN D 25 Hydroxy (Vit-D Deficiency, Fractures)  5. Essential hypertension -Watch sodium intake and continue to monitor blood pressure at home and continue with current treatment - BMP8+EGFR - DG Chest 2 View; Future  6. Neck pain -For the time being until lab work is returned the patient should try taking an Aleve 1 daily with something to protect his stomach at bedtime -He should also try salonpas and apply this daily at bedtime - DG Cervical Spine Complete; Future  7. Pain of both shoulder joints - DG Cervical Spine Complete; Future  8. Type 2 diabetes mellitus treated with insulin ([place]) -Continue aggressive therapeutic lifestyle changes and current treatment pending results of lab work  Meds ordered this encounter  Medications  . Insulin Detemir (LEVEMIR FLEXTOUCH) 100 UNIT/ML Pen    Sig: Inject up to 50 units once daily    Dispense:  45 mL    Refill:  3  . levothyroxine (SYNTHROID, LEVOTHROID) 75 MCG tablet    Sig: Take 1 tablet (75 mcg total) by mouth daily.    Dispense:  90 tablet    Refill:  3  . metFORMIN (GLUCOPHAGE) 1000 MG tablet    Sig: Take 1 tablet (1,000 mg total) by mouth 2 (two) times daily with a meal.    Dispense:  180 tablet    Refill:  3  . quinapril (ACCUPRIL) 20 MG tablet    Sig: Take 1 tablet (20 mg total) by mouth at bedtime.    Dispense:  90 tablet    Refill:  1  . ranitidine (ZANTAC) 150 MG tablet    Sig: Take 1 tablet (150 mg total) by mouth as needed.    Dispense:  90 tablet    Refill:  1   Patient Instructions  We will call with the results of the lab work and a chest x-ray and cervical spine x-ray as soon as those results become available Continue to use warm wet  compresses to neck and for the time being extra strength Tylenol The patient could also try salonpas We may need to get an MRI of the neck and if we do he should remind [place] that he has to have an open MRI.   Arrie Senate MD

## 2015-06-26 ENCOUNTER — Other Ambulatory Visit: Payer: Commercial Managed Care - HMO

## 2015-06-26 ENCOUNTER — Other Ambulatory Visit: Payer: Self-pay | Admitting: *Deleted

## 2015-06-26 DIAGNOSIS — M25512 Pain in left shoulder: Principal | ICD-10-CM

## 2015-06-26 DIAGNOSIS — Z1211 Encounter for screening for malignant neoplasm of colon: Secondary | ICD-10-CM

## 2015-06-26 DIAGNOSIS — M25511 Pain in right shoulder: Secondary | ICD-10-CM

## 2015-06-26 LAB — BMP8+EGFR
BUN / CREAT RATIO: 14 (ref 10–22)
BUN: 13 mg/dL (ref 8–27)
CO2: 23 mmol/L (ref 18–29)
CREATININE: 0.91 mg/dL (ref 0.76–1.27)
Calcium: 9.5 mg/dL (ref 8.6–10.2)
Chloride: 102 mmol/L (ref 96–106)
GFR calc Af Amer: 96 mL/min/{1.73_m2} (ref >59)
GFR, EST NON AFRICAN AMERICAN: 83 mL/min/{1.73_m2} (ref >59)
GLUCOSE: 144 mg/dL — AB (ref 65–99)
Potassium: 4.5 mmol/L (ref 3.5–5.2)
SODIUM: 142 mmol/L (ref 134–144)

## 2015-06-26 LAB — NMR, LIPOPROFILE
Cholesterol: 131 mg/dL (ref 100–199)
HDL Cholesterol by NMR: 50 mg/dL (ref >39)
HDL Particle Number: 33.1 umol/L (ref >=30.5)
LDL PARTICLE NUMBER: 670 nmol/L (ref <1000)
LDL Size: 21.3 nm (ref >20.5)
LDL-C: 70 mg/dL (ref 0–99)
LP-IR SCORE: 49 — AB (ref <=45)
Small LDL Particle Number: <90 nmol/L (ref <=527)
Triglycerides by NMR: 53 mg/dL (ref 0–149)

## 2015-06-26 LAB — HEPATIC FUNCTION PANEL
ALK PHOS: 59 IU/L (ref 39–117)
ALT: 27 IU/L (ref 0–44)
AST: 19 IU/L (ref 0–40)
Albumin: 4.4 g/dL (ref 3.5–4.8)
Bilirubin Total: 0.3 mg/dL (ref 0.0–1.2)
Bilirubin, Direct: 0.13 mg/dL (ref 0.00–0.40)
TOTAL PROTEIN: 6.6 g/dL (ref 6.0–8.5)

## 2015-06-26 LAB — THYROID PANEL WITH TSH
Free Thyroxine Index: 2.5 (ref 1.2–4.9)
T3 Uptake Ratio: 28 % (ref 24–39)
T4 TOTAL: 8.9 ug/dL (ref 4.5–12.0)
TSH: 1.69 u[IU]/mL (ref 0.450–4.500)

## 2015-06-26 LAB — VITAMIN D 25 HYDROXY (VIT D DEFICIENCY, FRACTURES): VIT D 25 HYDROXY: 42.6 ng/mL (ref 30.0–100.0)

## 2015-06-26 NOTE — Addendum Note (Signed)
Addended by: Juluis Rainier on: 06/26/2015 04:25 PM   Modules accepted: Orders

## 2015-06-28 LAB — FECAL OCCULT BLOOD, IMMUNOCHEMICAL: FECAL OCCULT BLD: NEGATIVE

## 2015-07-04 ENCOUNTER — Telehealth: Payer: Self-pay

## 2015-07-04 NOTE — Telephone Encounter (Signed)
Insurance denied [employer] test strips   Preferred are AccuChek Aviva Plus, Accu-chek Smartview, Truemetrix, Accu-Chek Compact plus, and Truetrack

## 2015-07-04 NOTE — Telephone Encounter (Signed)
Please okay the test strips that his insurance will pay for

## 2015-07-05 ENCOUNTER — Other Ambulatory Visit: Payer: Commercial Managed Care - HMO

## 2015-09-08 ENCOUNTER — Encounter: Payer: Self-pay | Admitting: *Deleted

## 2015-09-25 DIAGNOSIS — H25813 Combined forms of age-related cataract, bilateral: Secondary | ICD-10-CM | POA: Diagnosis not present

## 2015-09-25 DIAGNOSIS — E119 Type 2 diabetes mellitus without complications: Secondary | ICD-10-CM | POA: Diagnosis not present

## 2015-11-04 ENCOUNTER — Encounter: Payer: Self-pay | Admitting: Family Medicine

## 2015-11-04 ENCOUNTER — Ambulatory Visit (INDEPENDENT_AMBULATORY_CARE_PROVIDER_SITE_OTHER): Payer: Commercial Managed Care - HMO | Admitting: Family Medicine

## 2015-11-04 VITALS — BP 129/78 | HR 77 | Temp 97.4°F | Ht 70.0 in | Wt 172.0 lb

## 2015-11-04 DIAGNOSIS — H269 Unspecified cataract: Secondary | ICD-10-CM

## 2015-11-04 DIAGNOSIS — E559 Vitamin D deficiency, unspecified: Secondary | ICD-10-CM

## 2015-11-04 DIAGNOSIS — E782 Mixed hyperlipidemia: Secondary | ICD-10-CM | POA: Diagnosis not present

## 2015-11-04 DIAGNOSIS — E039 Hypothyroidism, unspecified: Secondary | ICD-10-CM

## 2015-11-04 DIAGNOSIS — E1165 Type 2 diabetes mellitus with hyperglycemia: Secondary | ICD-10-CM

## 2015-11-04 DIAGNOSIS — I10 Essential (primary) hypertension: Secondary | ICD-10-CM

## 2015-11-04 NOTE — Patient Instructions (Addendum)
Medicare Annual Wellness Visit  [facility] and the medical providers at [facility] strive to bring you the best medical care.  In doing so we not only want to address your current medical conditions and concerns but also to detect new conditions early and prevent illness, disease and health-related problems.    Medicare offers a yearly Wellness Visit which allows our clinical staff to assess your need for preventative services including immunizations, lifestyle education, counseling to decrease risk of preventable diseases and screening for fall risk and other medical concerns.    This visit is provided free of charge (no copay) for all Medicare recipients. The clinical pharmacists at [facility] have begun to conduct these Wellness Visits which will also include a thorough review of all your medications.    As you primary medical provider recommend that you make an appointment for your Annual Wellness Visit if you have not done so already this year.  You may set up this appointment before you leave today or you may call back [phone] and schedule an appointment.  Please make sure when you call that you mention that you are scheduling your Annual Wellness Visit with the clinical pharmacist so that the appointment may be made for the proper length of time.     Continue current medications. Continue good therapeutic lifestyle changes which include good diet and exercise. Fall precautions discussed with patient. If an FOBT was given today- please return it to our front desk. If you are over 56 years old - you may need Prevnar 56 or the adult Pneumonia vaccine.   After your visit with [place] today you will receive a survey in the mail or online from [employer] regarding your care with [place]. Please take a moment to fill this out. Your feedback is very important to [place] as you can help [place] better understand your patient needs as well as  improve your experience and satisfaction. WE CARE ABOUT YOU!!!   Continue to follow-up with ophthalmologist Stay active physically in the summer stay well hydrated Always rest during the hotter parts of the day Continue to monitor blood sugars regularly as you have been doing and keep feet checked regularly Always be careful and do not put yourself at risk for falling

## 2015-11-04 NOTE — Progress Notes (Signed)
Subjective:    Patient ID: IMRI LOR, male    DOB: 29-Jan-1941, 75 y.o.   MRN: [id number]  HPI Pt here for follow up and management of chronic medical problems which includes hypertension, diabetes and hyperlipidemia. He is taking medications regularly.There is doing well today with no specific complaints. He does have diabetes and he will get his lab work done today. He brings in home blood sugars for review and overall these are good and they were done throughout the day since April. The numbers range as low as 92 and as high as 136-140. These numbers will be scanned into his record. The patient staying active physically. He played golf this morning he works in his garden regularly. He denies any chest pain chest tightness pressure shortness of breath trouble swallowing heartburn indigestion nausea vomiting diarrhea blood in the stool or black tarry bowel movements. He is passing his water without problems. He has no musculoskeletal issues. He does have a couple of cataracts one on the right which is worse than the one on the left and he is being followed regularly I Dr. Barbarann Ehlers be and is planning cataract surgery in December.     Patient Active Problem List   Diagnosis Date Noted  . Right inguinal hernia 12/13/2013  . Essential hypertension 12/13/2013  . BPH (benign prostatic hyperplasia) 12/13/2013  . Hypothyroidism 08/07/2013  . Vitamin D deficiency 04/02/2013  . Goiter, nontoxic, multinodular   . Kidney stone   . Hyperlipidemia 01/15/2009  . ABNORMAL ELECTROCARDIOGRAM 01/15/2009  . Type 2 diabetes mellitus treated with insulin ([place]) 01/09/2009  . HIATAL HERNIA 01/09/2009  . BASAL CELL CARCINOMA, HX OF 01/09/2009  . DIVERTICULITIS, HX OF 01/09/2009   Outpatient Encounter Prescriptions as of 11/04/2015  Medication Sig  . aspirin 81 MG tablet Take 81 mg by mouth daily.    . Camphor-Eucalyptus-Menthol (MEDICATED CHEST RUB) 4.73-1.2-2.6 % OINT   . Cholecalciferol (VITAMIN D3) 5000  UNITS CAPS Take 5,000 Units by mouth. Take 1 capsule three times per week  . CVS CINNAMON PO Take 2,000 mg by mouth daily.    . fluticasone (FLONASE) 50 MCG/ACT nasal spray One to 2 sprays each nostril at bedtime  . Garlic 2952 MG CAPS Take by mouth daily.    Marland Kitchen glucose blood test strip Check BS TID and PRN.DX.E11.9  . Insulin Detemir (LEVEMIR FLEXTOUCH) 100 UNIT/ML Pen Inject up to 50 units once daily  . levothyroxine (SYNTHROID, LEVOTHROID) 75 MCG tablet Take 1 tablet (75 mcg total) by mouth daily.  . metFORMIN (GLUCOPHAGE) 1000 MG tablet Take 1 tablet (1,000 mg total) by mouth 2 (two) times daily with a meal.  . quinapril (ACCUPRIL) 20 MG tablet Take 1 tablet (20 mg total) by mouth at bedtime.  . ranitidine (ZANTAC) 150 MG tablet Take 1 tablet (150 mg total) by mouth as needed.  Marland Kitchen UNABLE TO FIND Apply 1 application topically daily. Diabetic skin relief foot cream   No facility-administered encounter medications on file as of 11/04/2015.      Review of Systems  Constitutional: Negative.   HENT: Negative.   Eyes: Negative.   Respiratory: Negative.   Cardiovascular: Negative.   Gastrointestinal: Negative.   Endocrine: Negative.   Genitourinary: Negative.   Musculoskeletal: Negative.   Skin: Negative.   Allergic/Immunologic: Negative.   Neurological: Negative.   Hematological: Negative.   Psychiatric/Behavioral: Negative.        Objective:   Physical Exam  Constitutional: He is oriented to person, place, and time.  He appears well-developed and well-nourished. No distress.  Alert and pleasant  HENT:  Head: Normocephalic and atraumatic.  Right Ear: External ear normal.  Left Ear: External ear normal.  Nose: Nose normal.  Mouth/Throat: Oropharynx is clear and moist. No oropharyngeal exudate.  Eyes: Conjunctivae and EOM are normal. Pupils are equal, round, and reactive to light. Right eye exhibits no discharge. Left eye exhibits no discharge. No scleral icterus.  Neck: Normal  range of motion. Neck supple. No thyromegaly present.  No bruits or thyromegaly or adenopathy  Cardiovascular: Normal rate, regular rhythm, normal heart sounds and intact distal pulses.   No murmur heard. Heart has a regular rate and rhythm at 72/m  Pulmonary/Chest: Effort normal and breath sounds normal. No respiratory distress. He has no wheezes. He has no rales. He exhibits no tenderness.  Clear anteriorly and posteriorly no axillary adenopathy  Abdominal: Soft. Bowel sounds are normal. He exhibits no mass. There is no tenderness. There is no rebound and no guarding.  No abdominal tenderness masses or bruits  Musculoskeletal: Normal range of motion. He exhibits no edema.  Lymphadenopathy:    He has no cervical adenopathy.  Neurological: He is alert and oriented to person, place, and time. He has normal reflexes. No cranial nerve deficit.  Skin: Skin is warm and dry. No rash noted.  Toenail fungus  Psychiatric: He has a normal mood and affect. His behavior is normal. Judgment and thought content normal.  Nursing note and vitals reviewed.  BP 129/78 mmHg  Pulse 77  Temp(Src) 97.4 F (36.3 C) (Oral)  Ht '5\' 10"'  (1.778 m)  Wt 172 lb (78.019 kg)  BMI 24.68 kg/m2        Assessment & Plan:  1. Hyperlipidemia -Continue aggressive therapeutic lifestyle changes with diet and exercise - CBC with Differential/Platelet; Future - NMR, lipoprofile; Future  2. Hypothyroidism, unspecified hypothyroidism type -Continue current treatment pending results of lab work - CBC with Differential/Platelet; Future - Hepatic function panel; Future - Thyroid Panel With TSH; Future  3. Type 2 diabetes mellitus with hyperglycemia, without long-term current use of insulin ([place]) -Continue monitoring blood sugar regularly and staying active physically and watching diet closely and continue with current treatment pending results of lab work - [facility] [place] Hb A1c Waived; Future - BMP8+EGFR; Future - CBC with  Differential/Platelet; Future  4. Essential hypertension -The blood pressure is good today and he will continue with current treatment - BMP8+EGFR; Future - CBC with Differential/Platelet; Future - Hepatic function panel; Future  5. Vitamin D deficiency -Continue current treatment pending results of lab work - CBC with Differential/Platelet; Future - VITAMIN D 25 Hydroxy (Vit-D Deficiency, Fractures); Future  6. Cataracts, bilateral -Follow-up with Dr. Bing Plume as planned  Patient Instructions                       Medicare Annual Wellness Visit  [facility] and the medical providers at [facility] strive to bring you the best medical care.  In doing so we not only want to address your current medical conditions and concerns but also to detect new conditions early and prevent illness, disease and health-related problems.    Medicare offers a yearly Wellness Visit which allows our clinical staff to assess your need for preventative services including immunizations, lifestyle education, counseling to decrease risk of preventable diseases and screening for fall risk and other medical concerns.    This visit is provided free of charge (no copay) for  all Medicare recipients. The clinical pharmacists at [facility] have begun to conduct these Wellness Visits which will also include a thorough review of all your medications.    As you primary medical provider recommend that you make an appointment for your Annual Wellness Visit if you have not done so already this year.  You may set up this appointment before you leave today or you may call back ([phone]) and schedule an appointment.  Please make sure when you call that you mention that you are scheduling your Annual Wellness Visit with the clinical pharmacist so that the appointment may be made for the proper length of time.     Continue current medications. Continue good therapeutic lifestyle  changes which include good diet and exercise. Fall precautions discussed with patient. If an FOBT was given today- please return it to our front desk. If you are over 29 years old - you may need Prevnar 74 or the adult Pneumonia vaccine.   After your visit with [place] today you will receive a survey in the mail or online from [employer] regarding your care with [place]. Please take a moment to fill this out. Your feedback is very important to [place] as you can help [place] better understand your patient needs as well as improve your experience and satisfaction. WE CARE ABOUT YOU!!!   Continue to follow-up with ophthalmologist Stay active physically in the summer stay well hydrated Always rest during the hotter parts of the day Continue to monitor blood sugars regularly as you have been doing and keep feet checked regularly Always be careful and do not put yourself at risk for falling   Arrie Senate MD

## 2015-11-05 ENCOUNTER — Ambulatory Visit: Payer: Commercial Managed Care - HMO | Admitting: Family Medicine

## 2015-11-12 ENCOUNTER — Other Ambulatory Visit: Payer: Commercial Managed Care - HMO

## 2015-11-12 DIAGNOSIS — E039 Hypothyroidism, unspecified: Secondary | ICD-10-CM

## 2015-11-12 DIAGNOSIS — E559 Vitamin D deficiency, unspecified: Secondary | ICD-10-CM

## 2015-11-12 DIAGNOSIS — I10 Essential (primary) hypertension: Secondary | ICD-10-CM

## 2015-11-12 DIAGNOSIS — E782 Mixed hyperlipidemia: Secondary | ICD-10-CM

## 2015-11-12 DIAGNOSIS — E1165 Type 2 diabetes mellitus with hyperglycemia: Secondary | ICD-10-CM

## 2015-11-12 LAB — BAYER DCA HB A1C WAIVED: HB A1C (BAYER DCA - WAIVED): 10.1 % — ABNORMAL HIGH (ref <7.0)

## 2015-11-13 ENCOUNTER — Telehealth: Payer: Self-pay | Admitting: Family Medicine

## 2015-11-13 LAB — CBC WITH DIFFERENTIAL/PLATELET
Basophils Absolute: 0 10*3/uL (ref 0.0–0.2)
Basos: 1 %
EOS (ABSOLUTE): 0.1 10*3/uL (ref 0.0–0.4)
EOS: 1 %
HEMATOCRIT: 46.3 % (ref 37.5–51.0)
Hemoglobin: 15.5 g/dL (ref 12.6–17.7)
IMMATURE GRANULOCYTES: 0 %
Immature Grans (Abs): 0 10*3/uL (ref 0.0–0.1)
Lymphocytes Absolute: 1.6 10*3/uL (ref 0.7–3.1)
Lymphs: 26 %
MCH: 28.3 pg (ref 26.6–33.0)
MCHC: 33.5 g/dL (ref 31.5–35.7)
MCV: 85 fL (ref 79–97)
MONOS ABS: 0.5 10*3/uL (ref 0.1–0.9)
Monocytes: 8 %
NEUTROS PCT: 64 %
Neutrophils Absolute: 4.1 10*3/uL (ref 1.4–7.0)
PLATELETS: 218 10*3/uL (ref 150–379)
RBC: 5.48 x10E6/uL (ref 4.14–5.80)
RDW: 14.5 % (ref 12.3–15.4)
WBC: 6.4 10*3/uL (ref 3.4–10.8)

## 2015-11-13 LAB — NMR, LIPOPROFILE
CHOLESTEROL: 114 mg/dL (ref 100–199)
HDL Cholesterol by NMR: 49 mg/dL (ref >39)
HDL Particle Number: 35.9 umol/L (ref >=30.5)
LDL Particle Number: 562 nmol/L (ref <1000)
LDL SIZE: 21.3 nm (ref >20.5)
LDL-C: 55 mg/dL (ref 0–99)
LP-IR SCORE: 44 (ref <=45)
TRIGLYCERIDES BY NMR: 51 mg/dL (ref 0–149)

## 2015-11-13 LAB — HEPATIC FUNCTION PANEL
ALBUMIN: 4.4 g/dL (ref 3.5–4.8)
ALT: 23 IU/L (ref 0–44)
AST: 19 IU/L (ref 0–40)
Alkaline Phosphatase: 62 IU/L (ref 39–117)
BILIRUBIN TOTAL: 0.4 mg/dL (ref 0.0–1.2)
Bilirubin, Direct: 0.13 mg/dL (ref 0.00–0.40)
TOTAL PROTEIN: 6.7 g/dL (ref 6.0–8.5)

## 2015-11-13 LAB — VITAMIN D 25 HYDROXY (VIT D DEFICIENCY, FRACTURES): VIT D 25 HYDROXY: 46.5 ng/mL (ref 30.0–100.0)

## 2015-11-13 LAB — BMP8+EGFR
BUN/Creatinine Ratio: 14 (ref 10–24)
BUN: 13 mg/dL (ref 8–27)
CO2: 25 mmol/L (ref 18–29)
CREATININE: 0.9 mg/dL (ref 0.76–1.27)
Calcium: 9.5 mg/dL (ref 8.6–10.2)
Chloride: 102 mmol/L (ref 96–106)
GFR calc Af Amer: 96 mL/min/{1.73_m2} (ref >59)
GFR calc non Af Amer: 83 mL/min/{1.73_m2} (ref >59)
GLUCOSE: 171 mg/dL — AB (ref 65–99)
Potassium: 4.7 mmol/L (ref 3.5–5.2)
SODIUM: 140 mmol/L (ref 134–144)

## 2015-11-13 LAB — THYROID PANEL WITH TSH
FREE THYROXINE INDEX: 2.7 (ref 1.2–4.9)
T3 Uptake Ratio: 29 % (ref 24–39)
T4, Total: 9.3 ug/dL (ref 4.5–12.0)
TSH: 1.57 u[IU]/mL (ref 0.450–4.500)

## 2015-11-13 NOTE — Telephone Encounter (Signed)
Will close encounter as the pt has open results encounter.

## 2015-11-17 ENCOUNTER — Ambulatory Visit (INDEPENDENT_AMBULATORY_CARE_PROVIDER_SITE_OTHER): Payer: Commercial Managed Care - HMO | Admitting: Pharmacist

## 2015-11-17 ENCOUNTER — Encounter: Payer: Self-pay | Admitting: Pharmacist

## 2015-11-17 VITALS — BP 134/82 | HR 77

## 2015-11-17 DIAGNOSIS — E161 Other hypoglycemia: Secondary | ICD-10-CM

## 2015-11-17 DIAGNOSIS — E1165 Type 2 diabetes mellitus with hyperglycemia: Secondary | ICD-10-CM

## 2015-11-17 DIAGNOSIS — Z794 Long term (current) use of insulin: Secondary | ICD-10-CM

## 2015-11-17 NOTE — Progress Notes (Signed)
Patient ID: [name] [name], male   DOB: 01-06-1941, 75 y.o.   MRN: [id number] Subjective:    [name] [name] is a 75 y.o. male who presents for a follow-up evaluation of type 2 DM requiring insulin   His diabetes continues to be inadequately controlled indicated by an increase in A1c from 10.5% to 10.1%. Insulin dosage review with Quadri suggested compliance all of the time. Associated symptoms of hyperglycemia have been none.  Associated symptoms of hypoglycemia have been sweating.  Although A1c is elevated patient's HBG readings continue to range from 65 to 199.   Patient reports awaking with sweats about 1 or 2 nights per week.  He reports that he has had two hypoglycemic events within the last 2 weeks.  He is currently taking levemir 45 units qam and metformin 1000mg  bid   Compliance with blood glucose monitoring: good.  The patient does perform independently. Exercise: intermittently. .  Meal panning: He is using avoidance of concentrated sweets and high CHO containing foods.Marland Kitchen     MedicAlert Identification Noted? no   The following portions of the patient's history were reviewed and updated as appropriate: allergies, current medications, past family history, past medical history, past social history, past surgical history and problem list.  Objective:    BP 134/82 (BP Location: Left Arm, Patient Position: Sitting, Cuff Size: Normal)   Pulse 77   Assessment:    Diabetes Mellitus type 2 requiring insulin -  under inadequate control.  Patient has high A1c but also experiencing hypoglycemic events / nocturnal hypoglycemia with high frequency   Plan:    1.  RX changes: none currently 2.  Due to continue inconsistency between HBG reading and A1c results will place Libra Pro CGM to evaluate BG more closely.  Discussed with PCP - Dr Redge Gainer and he agrees. 3. Patient is educated about CGM - will wear for up to 14 days.  He is to continue to check BG as he is currently doing.  Will use  caution when dressing and showering.  He is also advise he can exercise as usual.  If MRI or CT needed he is to remove CGM.  If CGM falls off he is to place in plastic bag provided and return to office.  He is also to keep food, medication and BG log and bring to his next visit. 4.  RTC in 2 weeks to download results and review / make med adjustments.  5.  Education:  blood sugar goals, hypoglycemia prevention and treatment, exercise, self-monitoring of blood glucose skills, nutrition and use of insulin pen Cherre Robins, PharmD, CPP, CDE

## 2015-12-01 ENCOUNTER — Encounter: Payer: Self-pay | Admitting: Pharmacist

## 2015-12-01 ENCOUNTER — Ambulatory Visit (INDEPENDENT_AMBULATORY_CARE_PROVIDER_SITE_OTHER): Payer: Commercial Managed Care - HMO | Admitting: Pharmacist

## 2015-12-01 VITALS — BP 120/78 | HR 76 | Ht 69.25 in | Wt 174.0 lb

## 2015-12-01 DIAGNOSIS — Z0000 Encounter for general adult medical examination without abnormal findings: Secondary | ICD-10-CM

## 2015-12-01 DIAGNOSIS — E119 Type 2 diabetes mellitus without complications: Secondary | ICD-10-CM

## 2015-12-01 DIAGNOSIS — Z794 Long term (current) use of insulin: Secondary | ICD-10-CM | POA: Diagnosis not present

## 2015-12-01 MED ORDER — GLIMEPIRIDE 2 MG PO TABS: 2.0000 mg | ORAL_TABLET | Freq: Every day | ORAL | 1 refills | Status: DC

## 2015-12-01 NOTE — Progress Notes (Signed)
Patient ID: [name] [name], male   DOB: 11-11-1940, 75 y.o.   MRN: [id number]    Subjective:   [name] [name] is a 75 y.o. male who presents for a Subsequent Medicare Annual Wellness Visit. He also has CGM placed 11/17/2015 and is here today for removal of CGM, review results and make diabetes medication changes as needed.   Reviewed CGM reports:  Daily average BG was 161 Estimated A1c = 7.0% He had 4 episodes of hypoglycemia;  2 occurred during night/ sleep, 1 occurred in the afternoon between lunch and supper and 1 occurred in evening after supper.   Most days BG pattern shows peaks in BG at breakfast and supper which are patients largest meals.   Current diabetes meds are metformin 1000mg  bid and Levemir 45 units qd.  He has taken Janumet (did not control BG enough)  in past prior to starting insulin and also glyburide (had hypoglycemia) and Invokana (not effective)  Review of Systems  Review of Systems  Constitutional: Negative.   HENT: Negative.   Eyes: Negative.   Respiratory: Negative.   Cardiovascular: Negative.   Gastrointestinal: Negative.   Genitourinary: Negative.   Musculoskeletal: Negative.   Skin: Negative.   Neurological: Negative.   Endo/Heme/Allergies: Negative.   Psychiatric/Behavioral: Negative.        Current Medications (verified) Outpatient Encounter Prescriptions as of 12/01/2015  Medication Sig  . aspirin 81 MG tablet Take 81 mg by mouth daily.    . Camphor-Eucalyptus-Menthol (MEDICATED CHEST RUB) 4.73-1.2-2.6 % OINT   . Cholecalciferol (VITAMIN D3) 5000 UNITS CAPS Take 5,000 Units by mouth. Take 1 capsule three times per week  . CVS CINNAMON PO Take 2,000 mg by mouth daily.    . fluticasone (FLONASE) 50 MCG/ACT nasal spray One to 2 sprays each nostril at bedtime  . Garlic 123XX123 MG CAPS Take by mouth daily.    Marland Kitchen glucose blood test strip Check BS TID and PRN.DX.E11.9  . Insulin Detemir (LEVEMIR FLEXTOUCH) 100 UNIT/ML Pen Inject up to 50 units once daily   . levothyroxine (SYNTHROID, LEVOTHROID) 75 MCG tablet Take 1 tablet (75 mcg total) by mouth daily.  . metFORMIN (GLUCOPHAGE) 1000 MG tablet Take 1 tablet (1,000 mg total) by mouth 2 (two) times daily with a meal.  . quinapril (ACCUPRIL) 20 MG tablet Take 1 tablet (20 mg total) by mouth at bedtime.  . ranitidine (ZANTAC) 150 MG tablet Take 1 tablet (150 mg total) by mouth as needed.  Marland Kitchen UNABLE TO FIND Apply 1 application topically daily. Diabetic skin relief foot cream  . glimepiride (AMARYL) 2 MG tablet Take 1 tablet (2 mg total) by mouth daily before breakfast.   No facility-administered encounter medications on file as of 12/01/2015.     Allergies (verified) Actos [pioglitazone hydrochloride]; Lisinopril; Invokana [canagliflozin]; Niaspan [niacin er]; and Ramipril   History: Past Medical History:  Diagnosis Date  . Basal cell carcinoma    left forehead, nose, neck  . Cataract   . Diabetes mellitus   . Diverticulitis   . Erectile dysfunction   . Gastroesophageal reflux disease with hiatal hernia   . Goiter, nontoxic, multinodular    with macrocyst  . Hiatal hernia    With reflex  . Hyperlipidemia   . Inguinal hernia    right side  . Kidney stone 1980  . Leg fracture    Left  . Liver hemangioma   . Nephrolithiasis   . Vitamin D deficiency    Past Surgical History:  Procedure  Laterality Date  . BACK SURGERY  1984  . LACERATION REPAIR  2008   Dr. Lenon Curt -to hand  . TIBIA FRACTURE SURGERY     left leg   Family History  Problem Relation Age of Onset  . Heart attack Sister     Sudden death in her 76s.  No clear as to the cause  . Heart disease Brother     Valvular  . Heart disease Mother 107    stent, lived to be 3  . Cancer Father     ?lung  . Early death Brother 3    spinal menigitis  . Diabetes Other    Social History   Occupational History  . Retired    Social History Main Topics  . Smoking status: Former Smoker    Packs/day: 1.00    Years: 27.00     Types: Cigarettes    Quit date: 04/20/1983  . Smokeless tobacco: Never Used     Comment: Quit 25 years ago  . Alcohol use No  . Drug use: No  . Sexual activity: Yes    Do you feel safe at home?  Yes Are there smokers in your home (other than you)? No  Dietary issues and exercise activities discussed: Current Exercise Habits: The patient does not participate in regular exercise at present  Current Dietary habits:  Patient tries to limit high CHO foods and limits portion sizes.     Cardiac Risk Factors include: advanced age (>83men, >57 women);diabetes mellitus;dyslipidemia;hypertension;male gender;sedentary lifestyle  Objective:    Today's Vitals   12/01/15 0908  BP: 120/78  Pulse: 76  Weight: 174 lb (78.9 kg)  Height: 5' 9.25" (1.759 m)  PainSc: 0-No pain   Body mass index is 25.51 kg/m.   Last A1c = 10.1% (11/12/2015)   Activities of Daily Living In your present state of health, do you have any difficulty performing the following activities: 12/01/2015  Hearing? N  Vision? N  Difficulty concentrating or making decisions? N  Walking or climbing stairs? N  Dressing or bathing? N  Doing errands, shopping? N  Preparing Food and eating ? N  Using the Toilet? N  In the past six months, have you accidently leaked urine? N  Do you have problems with loss of bowel control? N  Managing your Medications? N  Managing your Finances? N  Housekeeping or managing your Housekeeping? N  Some recent data might be hidden     Depression Screen PHQ 2/9 Scores 12/01/2015 11/04/2015 06/25/2015 01/31/2015  PHQ - 2 Score 1 0 4 0  PHQ- 9 Score - - 8 -     Fall Risk Fall Risk  12/01/2015 11/04/2015 01/31/2015 11/04/2014 09/13/2014  Falls in the past year? No No No Yes No  Number falls in past yr: - - - 1 -  Injury with Fall? - - - No -  Follow up - - - Falls evaluation completed;Falls prevention discussed -    Cognitive Function: MMSE - Mini Mental State Exam 12/01/2015 11/04/2014    Orientation to time 5 5  Orientation to Place 5 5  Registration 3 3  Attention/ Calculation 5 5  Recall 2 3  Language- name 2 objects 2 2  Language- repeat 1 1  Language- follow 3 step command 3 3  Language- read & follow direction 1 1  Write a sentence 1 1  Copy design 1 1  Total score 29 30    Immunizations and Health Maintenance Immunization History  Administered  Date(s) Administered  . Influenza Whole 01/17/2010  . Influenza, High Dose Seasonal PF 01/31/2015  . Influenza,inj,Quad PF,36+ Mos 02/06/2014  . Influenza-Unspecified 02/17/2013  . Pneumococcal Conjugate-13 04/02/2013  . Pneumococcal Polysaccharide-23 02/18/2008  . Td 12/19/2006   Health Maintenance Due  Topic Date Due  . INFLUENZA VACCINE  11/18/2015    Patient Care Team: Chipper Herb, MD as PCP - General (Family Medicine) Calvert Cantor, MD as Consulting Physician (Ophthalmology) Minus Breeding, MD as Consulting Physician (Cardiology) Crista Luria, MD as Consulting Physician (Dermatology)  Indicate any recent Medical Services you may have received from other than [facility] providers in the past year (date may be approximate).    Assessment:    Annual Wellness Visit  Type 2 diabetes, with long term insulin use - not adequately controlled   Screening Tests Health Maintenance  Topic Date Due  . INFLUENZA VACCINE  11/18/2015  . OPHTHALMOLOGY EXAM  01/18/2016  . HEMOGLOBIN A1C  05/14/2016  . COLON CANCER SCREENING ANNUAL FOBT  06/25/2016  . FOOT EXAM  11/03/2016  . TETANUS/TDAP  12/18/2016  . COLONOSCOPY  11/26/2020  . ZOSTAVAX  Completed  . PNA vac Low Risk Adult  Completed        Plan:   During the course of the visit Paulino was educated and counseled about the following appropriate screening and preventive services:   Vaccines to include Pneumoccal, Influenza,  Td, Zostavax - patient is UTD on all vaccines.  Reminded to get influenza vaccine in next 1-2 months  Colorectal cancer screening -  colonoscopy and FOBT are both UTD  Cardiovascular disease screening - EKG last 05/2014  Diabetes - add glimepiride 2mg  - take 1 tablet daily with breakfast;  Decrease Levemir to 40 units daily. Patient will check BG daily before and after breakfast to see if glimepride effective at decreasing BG after meals.  Other future options if this does not work are Medical sales representative (although patient is in medicare coverage gap and wishes to no start brand name medications if possible) or add meal times insulin with breakfast and supper.  Glaucoma screening / Diabetic Eye Exam - UTD  Nutrition counseling - continue to limit high CHO foods and serving sizes  Advanced Directives - pt declined information  Physical Activity - suggested he consider restarting [employer] now that his shoulder pain has resolved.   RTC in 1 month with home BG readings to assess diabetes  Orders Placed This Encounter  Procedures  . Microalbumin / creatinine urine ratio     Patient Instructions (the written plan) were given to the patient.   Cherre Robins, PharmD   12/01/2015

## 2015-12-01 NOTE — Patient Instructions (Signed)
[name] [name] , Thank you for taking time to come for your Medicare Wellness Visit. I appreciate your ongoing commitment to your health goals. Please review the following plan we discussed and let me know if I can assist you in the future.   These are the goals we discussed:  Decrease Levemir to 40 units once daily Add glimepiride 2mg  tablet 1 tablet daily Checked blood glucose once a day - alternating between check fasting in the morning and 2 hours after breakfast.   Increase exercise / physical activity - consider going back to [facility]  Increase non-starchy vegetables - carrots, green bean, squash, zucchini, tomatoes, onions, peppers, spinach and other green leafy vegetables, cabbage, lettuce, cucumbers, asparagus, okra (not fried), eggplant Limit sugar and processed foods (cakes, cookies, ice cream, crackers and chips) Increase fresh fruit but limit serving sizes 1/2 cup or about the size of tennis or baseball Limit red meat to no more than 1-2 times per week (serving size about the size of your palm) Choose whole grains / lean proteins - whole wheat bread, quinoa, whole grain rice (1/2 cup), fish, chicken, [place] Avoid sugar and calorie containing beverages - soda, sweet tea and juice.  Choose water or unsweetened tea instead.  This is a list of the screening recommended for you and due dates:  Health Maintenance  Topic Date Due  . Flu Shot  11/18/2015  . Eye exam for diabetics  01/18/2016  . Hemoglobin A1C  05/14/2016  . Stool Blood Test  06/25/2016  . Complete foot exam   11/03/2016  . Tetanus Vaccine  12/18/2016  . Colon Cancer Screening  11/26/2020  . Shingles Vaccine  Completed  . Pneumonia vaccines  Completed    Health Maintenance, Male A healthy lifestyle and preventative care can promote health and wellness.  Maintain regular health, dental, and eye exams.  Eat a healthy diet. Foods like vegetables, fruits, whole grains, low-fat dairy products, and lean protein foods  contain the nutrients you need and are low in calories. Decrease your intake of foods high in solid fats, added sugars, and salt. Get information about a proper diet from your health care provider, if necessary.  Regular physical exercise is one of the most important things you can do for your health. Most adults should get at least 150 minutes of moderate-intensity exercise (any activity that increases your heart rate and causes you to sweat) each week. In addition, most adults need muscle-strengthening exercises on 2 or more days a week.   Maintain a healthy weight. The body mass index (BMI) is a screening tool to identify possible weight problems. It provides an estimate of body fat based on height and weight. Your health care provider can find your BMI and can help you achieve or maintain a healthy weight. For males 20 years and older:  A BMI below 18.5 is considered underweight.  A BMI of 18.5 to 24.9 is normal.  A BMI of 25 to 29.9 is considered overweight.  A BMI of 30 and above is considered obese.  Maintain normal blood lipids and cholesterol by exercising and minimizing your intake of saturated fat. Eat a balanced diet with plenty of fruits and vegetables. Blood tests for lipids and cholesterol should begin at age 44 and be repeated every 5 years. If your lipid or cholesterol levels are high, you are over age 68, or you are at high risk for heart disease, you may need your cholesterol levels checked more frequently.Ongoing high lipid and cholesterol levels  should be treated with medicines if diet and exercise are not working.  If you smoke, find out from your health care provider how to quit. If you do not use tobacco, do not start.  Lung cancer screening is recommended for adults aged 24-80 years who are at high risk for developing lung cancer because of a history of smoking. A yearly low-dose CT scan of the lungs is recommended for people who have at least a 30-pack-year history of  smoking and are current smokers or have quit within the past 15 years. A pack year of smoking is smoking an average of 1 pack of cigarettes a day for 1 year (for example, a 30-pack-year history of smoking could mean smoking 1 pack a day for 30 years or 2 packs a day for 15 years). Yearly screening should continue until the smoker has stopped smoking for at least 15 years. Yearly screening should be stopped for people who develop a health problem that would prevent them from having lung cancer treatment.  If you choose to drink alcohol, do not have more than 2 drinks per day. One drink is considered to be 12 oz (360 mL) of beer, 5 oz (150 mL) of wine, or 1.5 oz (45 mL) of liquor.  Avoid the use of street drugs. Do not share needles with anyone. Ask for help if you need support or instructions about stopping the use of drugs.  High blood pressure causes heart disease and increases the risk of stroke. High blood pressure is more likely to develop in:  People who have blood pressure in the end of the normal range (100-139/85-89 mm Hg).  People who are overweight or obese.  People who are African American.  If you are 58-41 years of age, have your blood pressure checked every 3-5 years. If you are 74 years of age or older, have your blood pressure checked every year. You should have your blood pressure measured twice--once when you are at a hospital or clinic, and once when you are not at a hospital or clinic. Record the average of the two measurements. To check your blood pressure when you are not at a hospital or clinic, you can use:  An automated blood pressure machine at a pharmacy.  A home blood pressure monitor.  If you are 8-101 years old, ask your health care provider if you should take aspirin to prevent heart disease.  Diabetes screening involves taking a blood sample to check your fasting blood sugar level. This should be done once every 3 years after age 38 if you are at a normal weight  and without risk factors for diabetes. Testing should be considered at a younger age or be carried out more frequently if you are overweight and have at least 1 risk factor for diabetes.  Colorectal cancer can be detected and often prevented. Most routine colorectal cancer screening begins at the age of 62 and continues through age 83. However, your health care provider may recommend screening at an earlier age if you have risk factors for colon cancer. On a yearly basis, your health care provider may provide home test kits to check for hidden blood in the stool. A small camera at the end of a tube may be used to directly examine the colon (sigmoidoscopy or colonoscopy) to detect the earliest forms of colorectal cancer. Talk to your health care provider about this at age 89 when routine screening begins. A direct exam of the colon should be repeated every  5-10 years through age 73, unless early forms of precancerous polyps or small growths are found.  People who are at an increased risk for hepatitis B should be screened for this virus. You are considered at high risk for hepatitis B if:  You were born in a country where hepatitis B occurs often. Talk with your health care provider about which countries are considered high risk.  Your parents were born in a high-risk country and you have not received a shot to protect against hepatitis B (hepatitis B vaccine).  You have HIV or AIDS.  You use needles to inject street drugs.  You live with, or have sex with, someone who has hepatitis B.  You are a man who has sex with other men (MSM).  You get hemodialysis treatment.  You take certain medicines for conditions like cancer, organ transplantation, and autoimmune conditions.  Hepatitis C blood testing is recommended for all people born from [birth year] through 1965 and any individual with known risk factors for hepatitis C.  Healthy men should no longer receive prostate-specific antigen (PSA) blood tests  as part of routine cancer screening. Talk to your health care provider about prostate cancer screening.  Testicular cancer screening is not recommended for adolescents or adult males who have no symptoms. Screening includes self-exam, a health care provider exam, and other screening tests. Consult with your health care provider about any symptoms you have or any concerns you have about testicular cancer.  Practice safe sex. Use condoms and avoid high-risk sexual practices to reduce the spread of sexually transmitted infections (STIs).  You should be screened for STIs, including gonorrhea and chlamydia if:  You are sexually active and are younger than 24 years.  You are older than 24 years, and your health care provider tells you that you are at risk for this type of infection.  Your sexual activity has changed since you were last screened, and you are at an increased risk for chlamydia or gonorrhea. Ask your health care provider if you are at risk.  If you are at risk of being infected with HIV, it is recommended that you take a prescription medicine daily to prevent HIV infection. This is called pre-exposure prophylaxis (PrEP). You are considered at risk if:  You are a man who has sex with other men (MSM).  You are a heterosexual man who is sexually active with multiple partners.  You take drugs by injection.  You are sexually active with a partner who has HIV.  Talk with your health care provider about whether you are at high risk of being infected with HIV. If you choose to begin PrEP, you should first be tested for HIV. You should then be tested every 3 months for as long as you are taking PrEP.  Use sunscreen. Apply sunscreen liberally and repeatedly throughout the day. You should seek shade when your shadow is shorter than you. Protect yourself by wearing long sleeves, pants, a wide-brimmed hat, and sunglasses year round whenever you are outdoors.  Tell your health care provider of  new moles or changes in moles, especially if there is a change in shape or color. Also, tell your health care provider if a mole is larger than the size of a pencil eraser.  A one-time screening for abdominal aortic aneurysm (AAA) and surgical repair of large AAAs by ultrasound is recommended for men aged 16-75 years who are current or former smokers.  Stay current with your vaccines (immunizations).   This information  is not intended to replace advice given to you by your health care provider. Make sure you discuss any questions you have with your health care provider.   Document Released: 10/02/2007 Document Revised: 04/26/2014 Document Reviewed: 08/31/2010 Elsevier Interactive Patient Education [employer].

## 2015-12-02 LAB — MICROALBUMIN / CREATININE URINE RATIO
Creatinine, Urine: 33.5 mg/dL
MICROALB/CREAT RATIO: <9 mg/g creat (ref 0.0–30.0)
Microalbumin, Urine: <3 ug/mL

## 2016-01-01 ENCOUNTER — Encounter: Payer: Self-pay | Admitting: Pharmacist

## 2016-01-01 ENCOUNTER — Ambulatory Visit (INDEPENDENT_AMBULATORY_CARE_PROVIDER_SITE_OTHER): Payer: Commercial Managed Care - HMO | Admitting: Pharmacist

## 2016-01-01 VITALS — BP 122/86 | HR 70 | Ht 69.0 in | Wt 171.5 lb

## 2016-01-01 DIAGNOSIS — Z794 Long term (current) use of insulin: Secondary | ICD-10-CM

## 2016-01-01 DIAGNOSIS — E119 Type 2 diabetes mellitus without complications: Secondary | ICD-10-CM

## 2016-01-01 NOTE — Progress Notes (Signed)
Patient ID: [name] [name], male   DOB: [birth year], 75 y.o.   MRN: [id number] Subjective:    [name] [name] is a 75 y.o. male who presents for a follow-up evaluation of type 2 DM requiring insulin   His diabetes continues to be inadequately controlled indicated by an increase in A1c from 10.5% to 10.1%. I saw [name] [name] last about 1 month ago when I reviewed the results of his CGM .  At that time the following medicaiton changes were made - decreased Levemir from 45 to 40 units qd (patient was having hypoglycemia over night) and added glimepiride 2mg  with breakfast due to elevated BG post breakfast. He is also taking metformin 1000mg  bid   HBG:  Fasting am = 144, 142, 136 After breakfast = 136, 121, 125 Pre Lunch = 77, 98, 84 After Lunch = 126, 118 Pre Supper = 106, 98 After Supper = 133, 146 Bedtime = 106, 110  Compliance with blood glucose monitoring: good.  The patient does perform independently. Exercise: rarely.  Meal panning: He is using avoidance of concentrated sweets and high CHO containing foods.Marland Kitchen     MedicAlert Identification Noted? no   The following portions of the patient's history were reviewed and updated as appropriate: allergies, current medications, past family history, past medical history, past social history, past surgical history and problem list.  Objective:    BP 122/86   Pulse 70   Ht 5\' 9"  (1.753 m)   Wt 171 lb 8 oz (77.8 kg)   BMI 25.33 kg/m   Assessment:    Diabetes Mellitus type 2 requiring insulin -  under inadequate control per last A1c but HBG reading indicate improved BG over the last 30 days.   Plan:    1.  RX changes: none currently 2.  Recommended that patient restart exercise at [employer] program 3. Contineu to check BG qd to qod and continue to vary times of BG checks. 4.  Education:  blood sugar goals, hypoglycemia prevention and treatment, exercise, self-monitoring of blood glucose skills, nutrition and use of insulin pen 5.   RTC 02/18/16 to have flu vaccine and recheck A1c   Follow up with PCP scheduled already for 03/18/16

## 2016-01-12 ENCOUNTER — Telehealth: Payer: Self-pay | Admitting: Pharmacist

## 2016-01-12 MED ORDER — GLIMEPIRIDE 2 MG PO TABS: 2.0000 mg | ORAL_TABLET | Freq: Every day | ORAL | 1 refills | Status: DC

## 2016-01-12 NOTE — Telephone Encounter (Signed)
Patient needs rx for glimepiride sent to [facility] for 90 day supply.  Done

## 2016-01-24 ENCOUNTER — Other Ambulatory Visit: Payer: Self-pay | Admitting: Pharmacist

## 2016-02-18 ENCOUNTER — Other Ambulatory Visit (INDEPENDENT_AMBULATORY_CARE_PROVIDER_SITE_OTHER): Payer: Commercial Managed Care - HMO

## 2016-02-18 ENCOUNTER — Ambulatory Visit (INDEPENDENT_AMBULATORY_CARE_PROVIDER_SITE_OTHER): Payer: Commercial Managed Care - HMO

## 2016-02-18 DIAGNOSIS — E119 Type 2 diabetes mellitus without complications: Secondary | ICD-10-CM

## 2016-02-18 DIAGNOSIS — Z794 Long term (current) use of insulin: Secondary | ICD-10-CM | POA: Diagnosis not present

## 2016-02-18 DIAGNOSIS — Z23 Encounter for immunization: Secondary | ICD-10-CM

## 2016-02-18 LAB — BAYER DCA HB A1C WAIVED: HB A1C: 8.2 % — AB (ref <7.0)

## 2016-03-18 ENCOUNTER — Encounter (INDEPENDENT_AMBULATORY_CARE_PROVIDER_SITE_OTHER): Payer: Self-pay

## 2016-03-18 ENCOUNTER — Ambulatory Visit (INDEPENDENT_AMBULATORY_CARE_PROVIDER_SITE_OTHER): Payer: Commercial Managed Care - HMO | Admitting: Family Medicine

## 2016-03-18 ENCOUNTER — Encounter: Payer: Self-pay | Admitting: Family Medicine

## 2016-03-18 VITALS — BP 139/82 | HR 76 | Temp 97.0°F | Ht 69.0 in | Wt 167.6 lb

## 2016-03-18 DIAGNOSIS — E119 Type 2 diabetes mellitus without complications: Secondary | ICD-10-CM | POA: Diagnosis not present

## 2016-03-18 DIAGNOSIS — Z87438 Personal history of other diseases of male genital organs: Secondary | ICD-10-CM

## 2016-03-18 DIAGNOSIS — I10 Essential (primary) hypertension: Secondary | ICD-10-CM | POA: Diagnosis not present

## 2016-03-18 DIAGNOSIS — I739 Peripheral vascular disease, unspecified: Secondary | ICD-10-CM | POA: Diagnosis not present

## 2016-03-18 DIAGNOSIS — E559 Vitamin D deficiency, unspecified: Secondary | ICD-10-CM | POA: Diagnosis not present

## 2016-03-18 DIAGNOSIS — Z794 Long term (current) use of insulin: Secondary | ICD-10-CM | POA: Diagnosis not present

## 2016-03-18 DIAGNOSIS — E1136 Type 2 diabetes mellitus with diabetic cataract: Secondary | ICD-10-CM | POA: Diagnosis not present

## 2016-03-18 DIAGNOSIS — Z8249 Family history of ischemic heart disease and other diseases of the circulatory system: Secondary | ICD-10-CM

## 2016-03-18 DIAGNOSIS — E782 Mixed hyperlipidemia: Secondary | ICD-10-CM | POA: Diagnosis not present

## 2016-03-18 MED ORDER — QUINAPRIL HCL 20 MG PO TABS: 20.0000 mg | ORAL_TABLET | Freq: Every day | ORAL | 1 refills | Status: DC

## 2016-03-18 MED ORDER — RANITIDINE HCL 150 MG PO TABS: 150.0000 mg | ORAL_TABLET | ORAL | 1 refills | Status: DC | PRN

## 2016-03-18 NOTE — Patient Instructions (Signed)
Continue current medications. Continue good therapeutic lifestyle changes which include good diet and exercise. Fall precautions discussed with patient. If an FOBT was given today- please return it to our front desk. If you are over 75 years old - you may need Prevnar 13 or the adult Pneumonia vaccine.  **Flu shots are available--- please call and schedule a FLU-CLINIC appointment**  After your visit with us today you will receive a survey in the mail or online from [employer] regarding your care with us. Please take a moment to fill this out. Your feedback is very important to us as you can help us better understand your patient needs as well as improve your experience and satisfaction. WE CARE ABOUT YOU!!!    

## 2016-03-18 NOTE — Progress Notes (Addendum)
Subjective:    Patient ID: [name] [name], male    DOB: Sep 10, 1940, 75 y.o.   MRN: [id number]  HPI Pt is here today for a 4 mth recheck of chronic medical conditions which include diabetes, hyperlipidemia, hypertension and hypothyroidism.The patient brings blood sugars in for review and these will be scanned into the record and overall these are good and this is been the case in the past with this patient although his hemoglobin A1c usually runs a lot higher than the apparent blood sugars appeared that it should be. The patient has recently had glimepiride 2 mg twice daily added to his treatment regimen. His A1c checked the first of this month was improved from 10 down to the 8 range. The clinical pharmacists was please with this and we will continue to monitor this over the next several months. The patient denies any chest pain pressure palpitations or shortness of breath. He has no trouble with his stomach including nausea vomiting heartburn indigestion blood in the stool or black tarry bowel movements. He is passing his water without problems. He has had an eye exam and this was done about 6 months ago with Dr. Bing Plume any has a repeat PT exam coming up in December.    Patient Active Problem List   Diagnosis Date Noted  . Right inguinal hernia 12/13/2013  . Essential hypertension 12/13/2013  . BPH (benign prostatic hyperplasia) 12/13/2013  . Hypothyroidism 08/07/2013  . Vitamin D deficiency 04/02/2013  . Goiter, nontoxic, multinodular   . Kidney stone   . Hyperlipidemia 01/15/2009  . ABNORMAL ELECTROCARDIOGRAM 01/15/2009  . Type 2 diabetes mellitus treated with insulin ([place]) 01/09/2009  . HIATAL HERNIA 01/09/2009  . BASAL CELL CARCINOMA, HX OF 01/09/2009  . DIVERTICULITIS, HX OF 01/09/2009   Outpatient Encounter Prescriptions as of 03/18/2016  Medication Sig  . aspirin 81 MG tablet Take 81 mg by mouth daily.    . Camphor-Eucalyptus-Menthol (MEDICATED CHEST RUB) 4.73-1.2-2.6 % OINT   .  Cholecalciferol (VITAMIN D3) 5000 UNITS CAPS Take 5,000 Units by mouth. Take 1 capsule three times per week  . [facility]S CINNAMON PO Take 2,000 mg by mouth daily.    . fluticasone (FLONASE) 50 MCG/ACT nasal spray One to 2 sprays each nostril at bedtime  . Garlic 123XX123 MG CAPS Take by mouth daily.    Marland Kitchen glimepiride (AMARYL) 2 MG tablet Take 1 tablet (2 mg total) by mouth daily before breakfast.  . glucose blood test strip Check BS TID and [id number]  . Insulin Detemir (LEVEMIR FLEXTOUCH) 100 UNIT/ML Pen Inject up to 50 units once daily  . levothyroxine (SYNTHROID, LEVOTHROID) 75 MCG tablet Take 1 tablet (75 mcg total) by mouth daily.  . metFORMIN (GLUCOPHAGE) 1000 MG tablet Take 1 tablet (1,000 mg total) by mouth 2 (two) times daily with a meal.  . quinapril (ACCUPRIL) 20 MG tablet Take 1 tablet (20 mg total) by mouth at bedtime.  . ranitidine (ZANTAC) 150 MG tablet Take 1 tablet (150 mg total) by mouth as needed.  Marland Kitchen UNABLE TO FIND Apply 1 application topically daily. Diabetic skin relief foot cream  . [DISCONTINUED] glimepiride (AMARYL) 2 MG tablet TAKE ONE TABLET BY MOUTH ONCE DAILY BEFORE  BREAKFAST   No facility-administered encounter medications on file as of 03/18/2016.       Review of Systems  HENT: Positive for congestion (slight chest congestion).   Eyes: Negative.   Respiratory: Positive for cough (occasional prod cough).   Cardiovascular: Negative.   Endocrine: Negative.  Genitourinary: Negative.   Musculoskeletal: Negative for arthralgias and myalgias.  Allergic/Immunologic: Positive for environmental allergies.  Neurological: Negative.        Objective:   Physical Exam  Constitutional: He is oriented to person, place, and time. He appears well-developed and well-nourished. No distress.  HENT:  Head: Normocephalic and atraumatic.  Right Ear: External ear normal.  Left Ear: External ear normal.  Mouth/Throat: Oropharynx is clear and moist. No oropharyngeal exudate.  Slight  nasal congestion right greater than left  Eyes: Conjunctivae and EOM are normal. Pupils are equal, round, and reactive to light. Right eye exhibits no discharge. Left eye exhibits no discharge. No scleral icterus.  Neck: Normal range of motion. Neck supple. No thyromegaly present.  No bruits thyromegaly or anterior cervical adenopathy  Cardiovascular: Normal rate and regular rhythm.   No murmur heard. Pulses in the right lower extremity were difficult to palpate. There was a good posterior tibial pulse on the left. The patient is not having any claudication symptoms. The heart has a regular rate and rhythm at 60/m.  Pulmonary/Chest: Effort normal and breath sounds normal. No respiratory distress. He has no wheezes. He has no rales. He exhibits no tenderness.  Clear anteriorly and posteriorly and no axillary adenopathy  Abdominal: Soft. Bowel sounds are normal. He exhibits no mass. There is no tenderness. There is no rebound and no guarding.  No abdominal tenderness liver or spleen enlargement or inguinal adenopathy  Genitourinary: Rectum normal and penis normal.  Genitourinary Comments: The prostate was enlarged but soft and smooth. There are no rectal masses. There are no inguinal hernias palpable. The external genitalia were within normal limits.  Musculoskeletal: Normal range of motion. He exhibits no edema.  Lymphadenopathy:    He has no cervical adenopathy.  Neurological: He is alert and oriented to person, place, and time. He has normal reflexes. No cranial nerve deficit.  Skin: Skin is warm and dry. No rash noted.  Psychiatric: He has a normal mood and affect. His behavior is normal. Judgment and thought content normal.  Nursing note and vitals reviewed.  BP 124/73 (BP Location: Left Arm, Patient Position: Sitting, Cuff Size: Normal)   Pulse 76   Temp 97 F (36.1 C) (Oral)   Ht 5\' 9"  (1.753 m)   Wt 167 lb 9.6 oz (76 kg)   SpO2 99%   BMI 24.75 kg/m         Assessment & Plan:    1. Type 2 diabetes mellitus treated with insulin ([place]) -Continue with aggressive therapeutic lifestyle changes and current treatment and always watching and sensing for low blood sugars. Keep a snack handy if you feel this low blood sugar is happening.  2. Hyperlipidemia -Continue with aggressive therapeutic lifestyle changes and current treatment pending results of lab work  3. Essential hypertension -The repeat blood pressure is good he should continue with current treatment and watching his salt in take.  4. Vitamin D deficiency -Continue with current treatment pending results of lab work  5. Cataract due to DM [facility]) -Continue to follow-up with Dr. Bing Plume and for the cataract.  Patient Instructions      Continue current medications. Continue good therapeutic lifestyle changes which include good diet and exercise. Fall precautions discussed with patient. If an FOBT was given today- please return it to our front desk. If you are over 69 years old - you may need Prevnar 57 or the adult Pneumonia vaccine.  **Flu shots are available--- please call and schedule a  FLU-CLINIC appointment**  After your visit with [place] today you will receive a survey in the mail or online from [employer] regarding your care with [place]. Please take a moment to fill this out. Your feedback is very important to [place] as you can help [place] better understand your patient needs as well as improve your experience and satisfaction. WE CARE ABOUT YOU!!!   .  Arrie Senate MD   6. Family history of heart disease -This was reviewed with the patient today and his sister was found dead and most likely died of a massive heart attack.  7.Peripheral vascular insufficiency  Arrie Senate MD

## 2016-03-18 NOTE — Addendum Note (Signed)
Addended by: Marin Olp on: 03/18/2016 10:13 AM   Modules accepted: Orders

## 2016-03-19 LAB — CBC WITH DIFFERENTIAL/PLATELET
Basophils Absolute: 0 x10E3/uL (ref 0.0–0.2)
Basos: 0 %
EOS (ABSOLUTE): 0.2 x10E3/uL (ref 0.0–0.4)
Eos: 2 %
Hematocrit: 46.8 % (ref 37.5–51.0)
Hemoglobin: 16 g/dL (ref 12.6–17.7)
Immature Grans (Abs): 0 x10E3/uL (ref 0.0–0.1)
Immature Granulocytes: 0 %
Lymphocytes Absolute: 1.6 x10E3/uL (ref 0.7–3.1)
Lymphs: 25 %
MCH: 28.3 pg (ref 26.6–33.0)
MCHC: 34.2 g/dL (ref 31.5–35.7)
MCV: 83 fL (ref 79–97)
Monocytes Absolute: 0.5 x10E3/uL (ref 0.1–0.9)
Monocytes: 7 %
Neutrophils Absolute: 4.3 x10E3/uL (ref 1.4–7.0)
Neutrophils: 66 %
Platelets: 251 x10E3/uL (ref 150–379)
RBC: 5.65 x10E6/uL (ref 4.14–5.80)
RDW: 13.8 % (ref 12.3–15.4)
WBC: 6.6 x10E3/uL (ref 3.4–10.8)

## 2016-03-19 LAB — NMR, LIPOPROFILE
CHOLESTEROL: 132 mg/dL (ref 100–199)
HDL CHOLESTEROL BY NMR: 53 mg/dL (ref >39)
HDL PARTICLE NUMBER: 34.2 umol/L (ref >=30.5)
LDL Particle Number: 767 nmol/L (ref <1000)
LDL Size: 21.3 nm (ref >20.5)
LDL-C: 69 mg/dL (ref 0–99)
LP-IR Score: 47 — ABNORMAL HIGH (ref <=45)
Small LDL Particle Number: 98 nmol/L (ref <=527)
Triglycerides by NMR: 52 mg/dL (ref 0–149)

## 2016-03-19 LAB — HEPATIC FUNCTION PANEL
ALBUMIN: 4.7 g/dL (ref 3.5–4.8)
ALT: 27 IU/L (ref 0–44)
AST: 25 IU/L (ref 0–40)
Alkaline Phosphatase: 63 IU/L (ref 39–117)
BILIRUBIN TOTAL: 0.4 mg/dL (ref 0.0–1.2)
Bilirubin, Direct: 0.13 mg/dL (ref 0.00–0.40)
TOTAL PROTEIN: 7.3 g/dL (ref 6.0–8.5)

## 2016-03-19 LAB — PSA, TOTAL AND FREE
PROSTATE SPECIFIC AG, SERUM: 2.9 ng/mL (ref 0.0–4.0)
PSA, Free Pct: 23.8 %
PSA, Free: 0.69 ng/mL

## 2016-03-19 LAB — VITAMIN D 25 HYDROXY (VIT D DEFICIENCY, FRACTURES): Vit D, 25-Hydroxy: 49.2 ng/mL (ref 30.0–100.0)

## 2016-04-01 DIAGNOSIS — H35373 Puckering of macula, bilateral: Secondary | ICD-10-CM | POA: Diagnosis not present

## 2016-04-01 DIAGNOSIS — E119 Type 2 diabetes mellitus without complications: Secondary | ICD-10-CM | POA: Diagnosis not present

## 2016-04-01 DIAGNOSIS — H25813 Combined forms of age-related cataract, bilateral: Secondary | ICD-10-CM | POA: Diagnosis not present

## 2016-04-01 DIAGNOSIS — H524 Presbyopia: Secondary | ICD-10-CM | POA: Diagnosis not present

## 2016-05-24 ENCOUNTER — Other Ambulatory Visit: Payer: Self-pay | Admitting: Family Medicine

## 2016-05-27 IMAGING — CR DG HAND COMPLETE 3+V*L*
3 series · 3 of 3 positions shown · non-contrast
Comparison: Left hand series of May 10, 2007.

CLINICAL DATA: Index finger pain

EXAM:
LEFT HAND - COMPLETE 3+ VIEW

[view not recorded (1 of 3)]
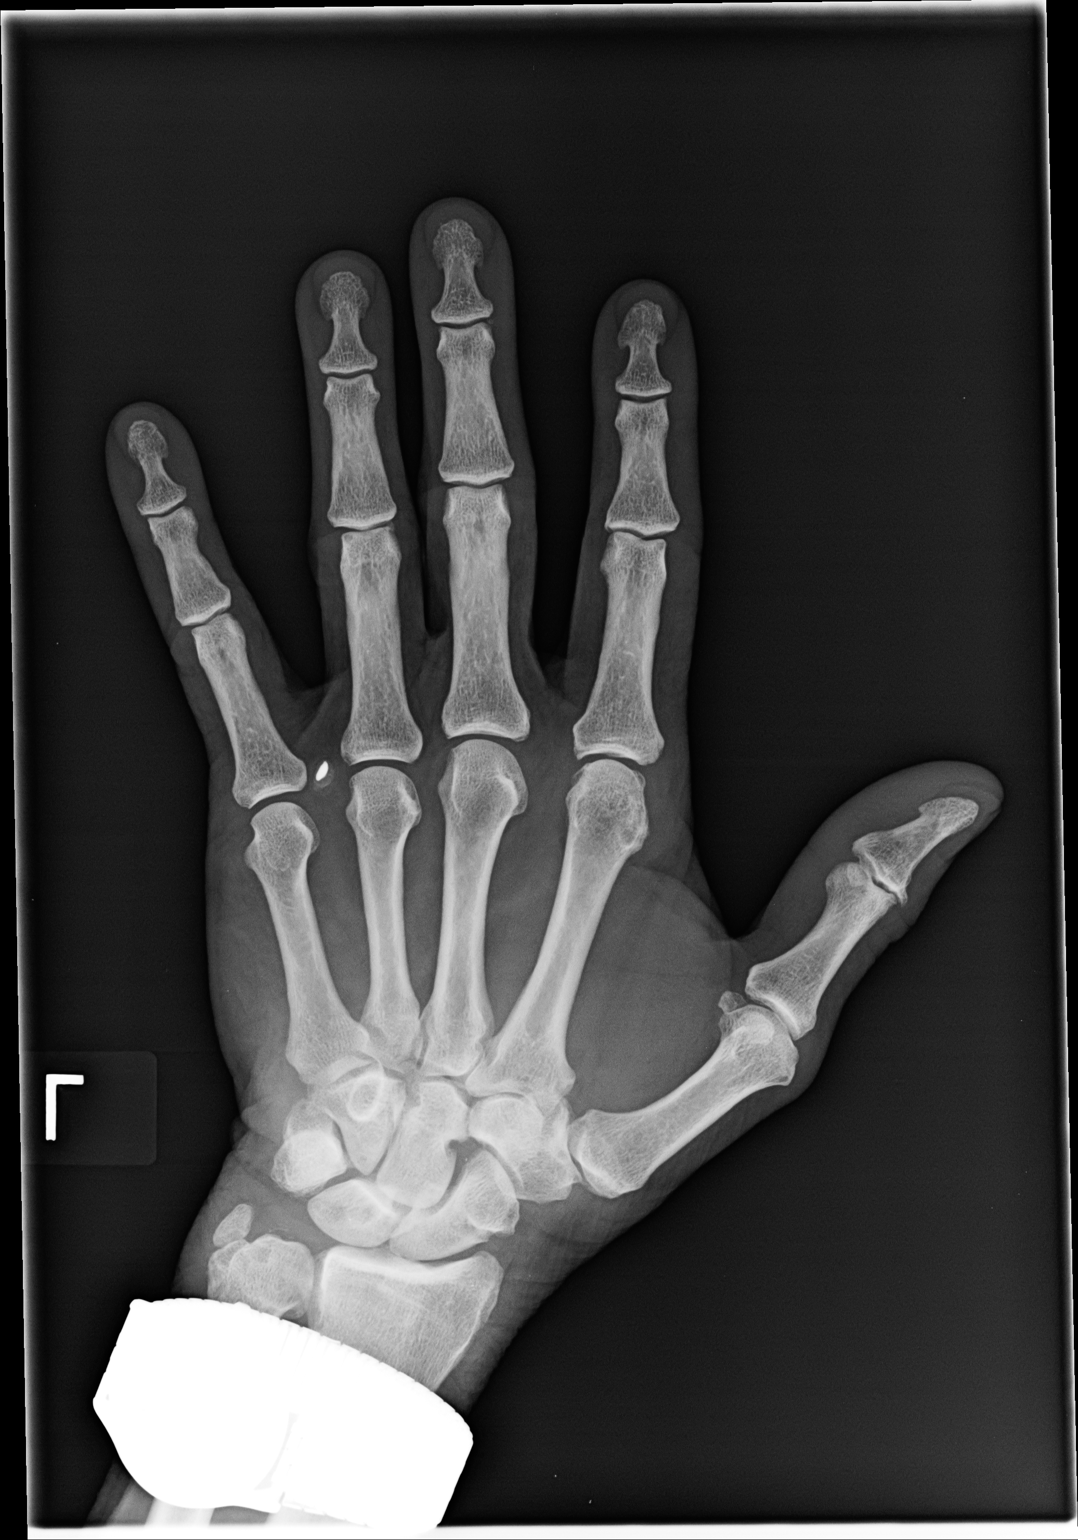

[view not recorded (2 of 3)]
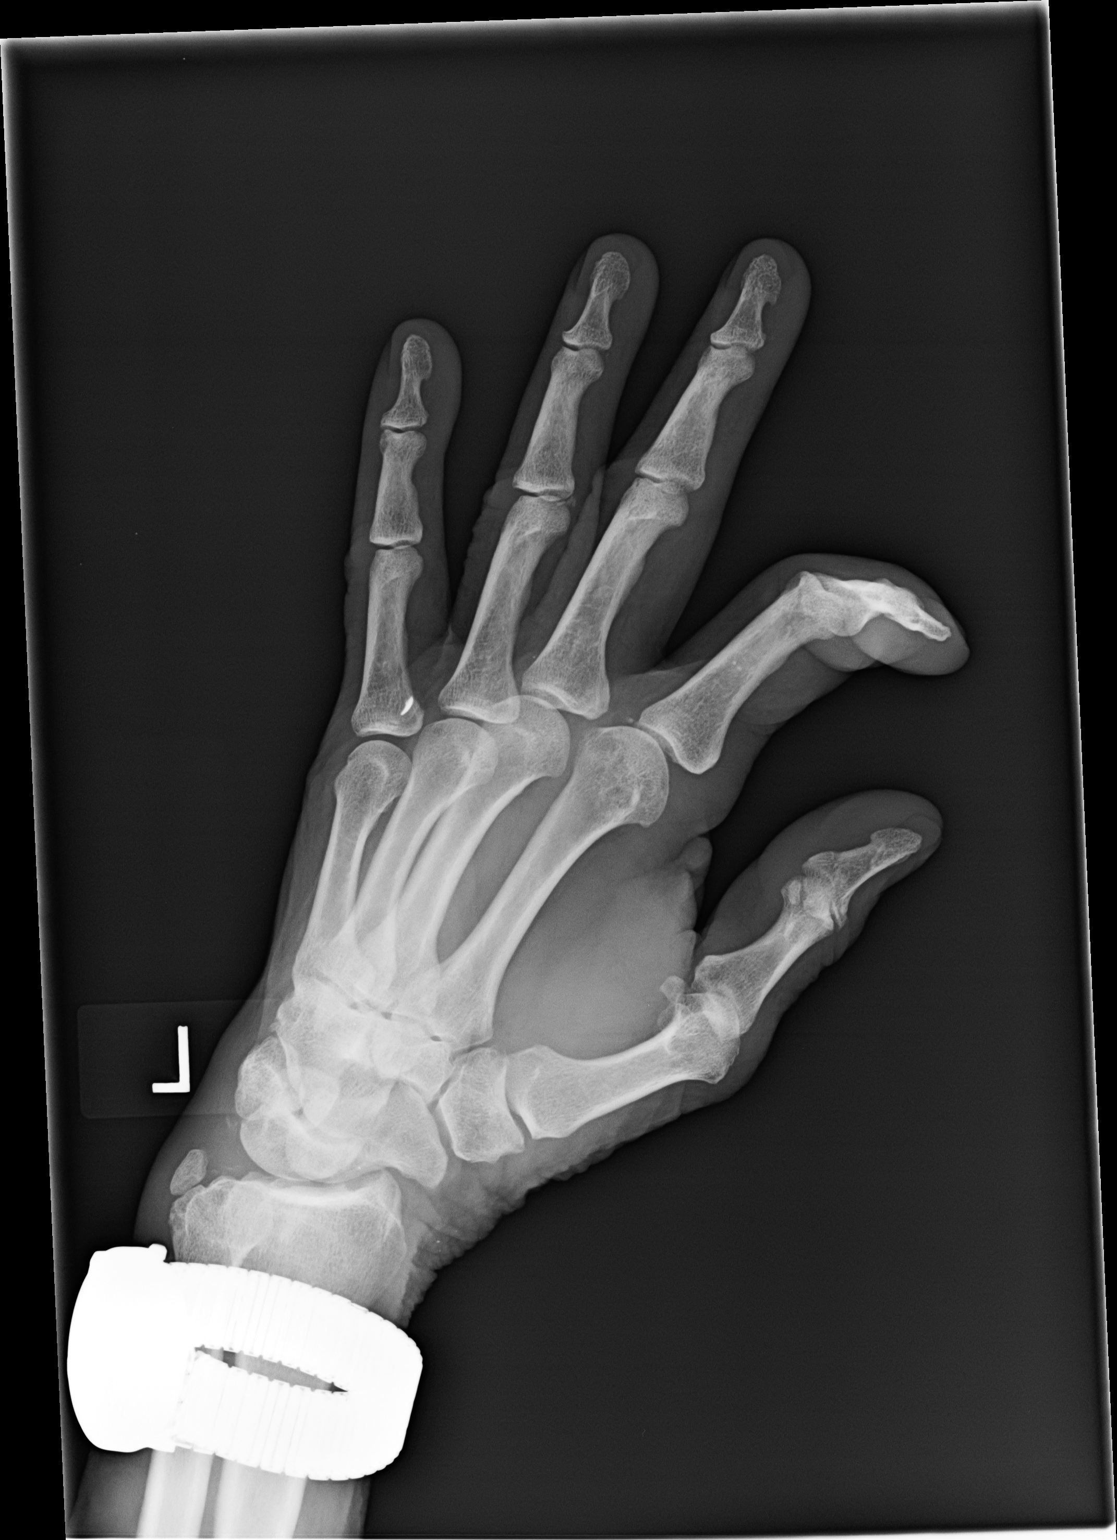

[view not recorded (3 of 3)]
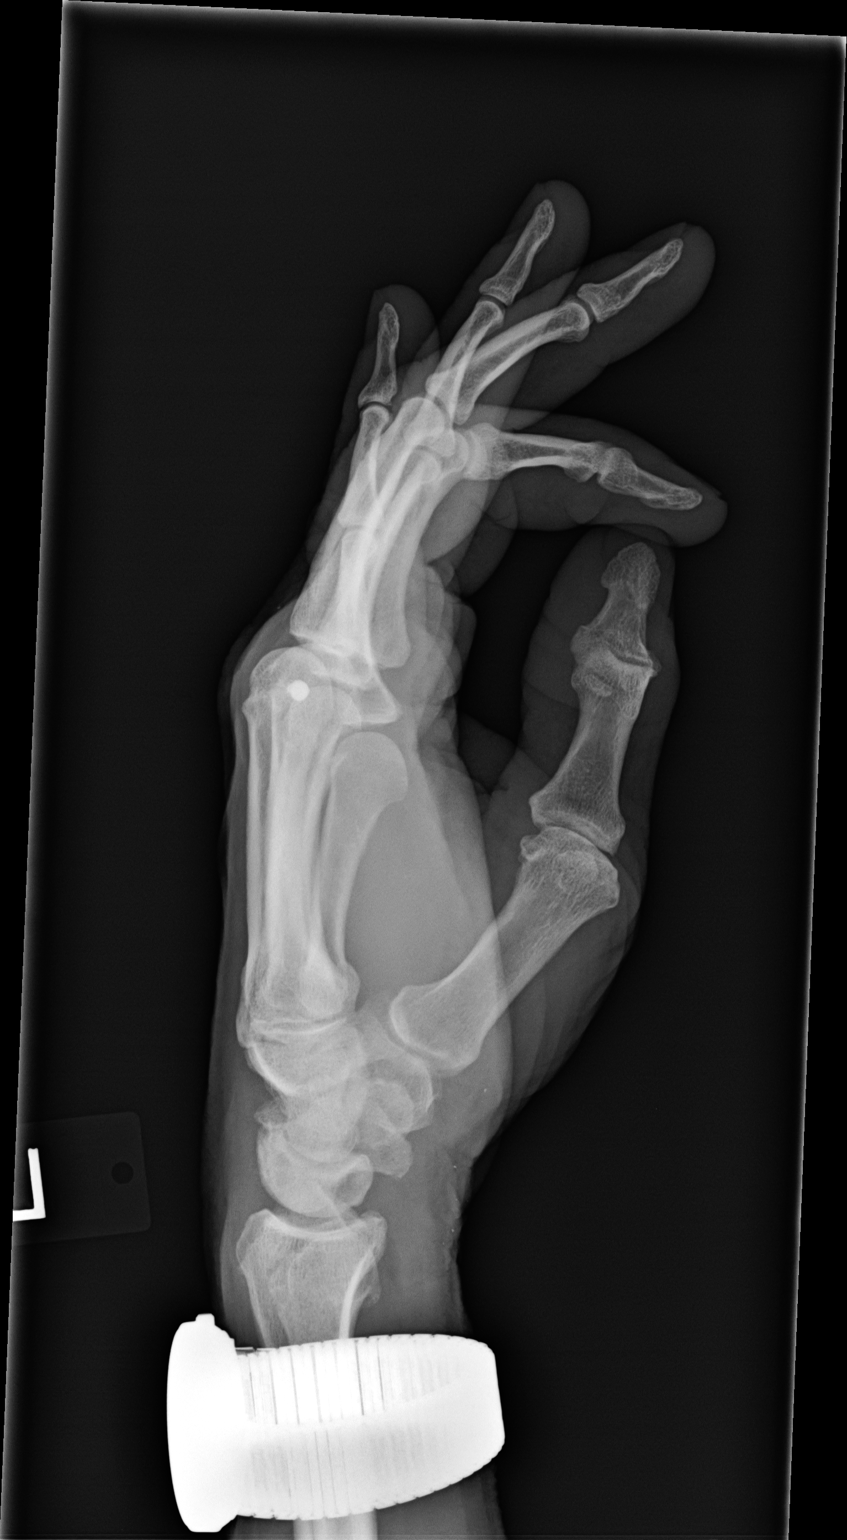

[3 of 3 positions shown; findings below may reference images not displayed]

FINDINGS: The bones are adequately mineralized. There is a stable foreign body
in the interspace between the bases of the fourth and fifth MCP
joint. The interphalangeal joints and the metacarpophalangeal joints
are preserved. There is mild degenerative change of the first
carpometacarpal joint. There is an old ulnar styloid fracture.
IMPRESSION: There is no acute bony abnormality of the left hand.

## 2016-08-05 ENCOUNTER — Encounter: Payer: Self-pay | Admitting: Family Medicine

## 2016-08-05 ENCOUNTER — Ambulatory Visit (INDEPENDENT_AMBULATORY_CARE_PROVIDER_SITE_OTHER): Payer: Medicare PPO | Admitting: Family Medicine

## 2016-08-05 VITALS — BP 131/81 | HR 71 | Temp 96.8°F | Ht 69.0 in | Wt 171.0 lb

## 2016-08-05 DIAGNOSIS — Z794 Long term (current) use of insulin: Secondary | ICD-10-CM

## 2016-08-05 DIAGNOSIS — Z87438 Personal history of other diseases of male genital organs: Secondary | ICD-10-CM

## 2016-08-05 DIAGNOSIS — E119 Type 2 diabetes mellitus without complications: Secondary | ICD-10-CM

## 2016-08-05 DIAGNOSIS — E782 Mixed hyperlipidemia: Secondary | ICD-10-CM | POA: Diagnosis not present

## 2016-08-05 DIAGNOSIS — I739 Peripheral vascular disease, unspecified: Secondary | ICD-10-CM

## 2016-08-05 DIAGNOSIS — Z8249 Family history of ischemic heart disease and other diseases of the circulatory system: Secondary | ICD-10-CM | POA: Diagnosis not present

## 2016-08-05 DIAGNOSIS — I10 Essential (primary) hypertension: Secondary | ICD-10-CM

## 2016-08-05 DIAGNOSIS — E559 Vitamin D deficiency, unspecified: Secondary | ICD-10-CM | POA: Diagnosis not present

## 2016-08-05 DIAGNOSIS — W57XXXA Bitten or stung by nonvenomous insect and other nonvenomous arthropods, initial encounter: Secondary | ICD-10-CM

## 2016-08-05 DIAGNOSIS — J301 Allergic rhinitis due to pollen: Secondary | ICD-10-CM | POA: Diagnosis not present

## 2016-08-05 LAB — BAYER DCA HB A1C WAIVED: HB A1C: 8.5 % — AB (ref <7.0)

## 2016-08-05 MED ORDER — QUINAPRIL HCL 20 MG PO TABS: 20.0000 mg | ORAL_TABLET | Freq: Every day | ORAL | 3 refills | Status: DC

## 2016-08-05 MED ORDER — LEVOTHYROXINE SODIUM 75 MCG PO TABS: 75.0000 ug | ORAL_TABLET | Freq: Every day | ORAL | 3 refills | Status: DC

## 2016-08-05 MED ORDER — DOXYCYCLINE HYCLATE 100 MG PO TABS: 100.0000 mg | ORAL_TABLET | Freq: Two times a day (BID) | ORAL | 1 refills | Status: DC

## 2016-08-05 MED ORDER — GLIMEPIRIDE 2 MG PO TABS: 2.0000 mg | ORAL_TABLET | Freq: Every day | ORAL | 3 refills | Status: DC

## 2016-08-05 MED ORDER — FLUTICASONE PROPIONATE 50 MCG/ACT NA SUSP: NASAL | 3 refills | Status: DC

## 2016-08-05 MED ORDER — METFORMIN HCL 1000 MG PO TABS: 1000.0000 mg | ORAL_TABLET | Freq: Two times a day (BID) | ORAL | 3 refills | Status: DC

## 2016-08-05 NOTE — Patient Instructions (Signed)
Medicare Annual Wellness Visit  [place] and the medical providers at [facility] strive to bring you the best medical care.  In doing so we not only want to address your current medical conditions and concerns but also to detect new conditions early and prevent illness, disease and health-related problems.    Medicare offers a yearly Wellness Visit which allows our clinical staff to assess your need for preventative services including immunizations, lifestyle education, counseling to decrease risk of preventable diseases and screening for fall risk and other medical concerns.    This visit is provided free of charge (no copay) for all Medicare recipients. The clinical pharmacists at [facility] have begun to conduct these Wellness Visits which will also include a thorough review of all your medications.    As you primary medical provider recommend that you make an appointment for your Annual Wellness Visit if you have not done so already this year.  You may set up this appointment before you leave today or you may call back ([phone]) and schedule an appointment.  Please make sure when you call that you mention that you are scheduling your Annual Wellness Visit with the clinical pharmacist so that the appointment may be made for the proper length of time.     Continue current medications. Continue good therapeutic lifestyle changes which include good diet and exercise. Fall precautions discussed with patient. If an FOBT was given today- please return it to our front desk. If you are over 50 years old - you may need Prevnar 13 or the adult Pneumonia vaccine.  **Flu shots are available--- please call and schedule a FLU-CLINIC appointment**  After your visit with us today you will receive a survey in the mail or online from [employer] regarding your care with us. Please take a moment to fill this out. Your feedback is very  important to us as you can help us better understand your patient needs as well as improve your experience and satisfaction. WE CARE ABOUT YOU!!!    

## 2016-08-05 NOTE — Progress Notes (Signed)
Subjective:    Patient ID: [name] [name], male    DOB: 11-27-1940, 76 y.o.   MRN: [id number]  HPI Pt here for follow up and management of chronic medical problems which include diabetes, hyperlipidemia and hypertension. He is taking medication regularly.The patient is requesting refills on all his medicines. He has a terminal that some of the problems with the blood sugar may be secondary to his prednisolone eyedrops. He'll get lab work today and will be given an FOBT to return. The patient does bring in blood sugars from home and all of these look good with a highest reading being 140. The patient denies any chest pain or shortness of breath. He denies any trouble with his intestinal tract including nausea vomiting diarrhea blood in the stool black tarry bowel movements or change in bowel habits. He says he swallowing his food without problems and has no heartburn or indigestion. He's passing his water without problems. He says he will be on the prednisolone eyedrops for at least 2 more weeks and he will continue to monitor his blood sugars at home and we will see if there's no improvement after getting off of these drops. He does bring in blood sugars for review today and I will be scanned into the record.    Patient Active Problem List   Diagnosis Date Noted  . Peripheral vascular insufficiency ([place]) 03/18/2016  . Family history of heart disease 03/18/2016  . Right inguinal hernia 12/13/2013  . Essential hypertension 12/13/2013  . BPH (benign prostatic hyperplasia) 12/13/2013  . Hypothyroidism 08/07/2013  . Vitamin D deficiency 04/02/2013  . Goiter, nontoxic, multinodular   . Kidney stone   . Hyperlipidemia 01/15/2009  . ABNORMAL ELECTROCARDIOGRAM 01/15/2009  . Type 2 diabetes mellitus treated with insulin ([place]) 01/09/2009  . HIATAL HERNIA 01/09/2009  . BASAL CELL CARCINOMA, HX OF 01/09/2009  . DIVERTICULITIS, HX OF 01/09/2009   Outpatient Encounter Prescriptions as of 08/05/2016    Medication Sig  . aspirin 81 MG tablet Take 81 mg by mouth daily.    . Camphor-Eucalyptus-Menthol (MEDICATED CHEST RUB) 4.73-1.2-2.6 % OINT   . Cholecalciferol (VITAMIN D3) 5000 UNITS CAPS Take 5,000 Units by mouth. Take 1 capsule three times per week  . CVS CINNAMON PO Take 2,000 mg by mouth daily.    . fluticasone (FLONASE) 50 MCG/ACT nasal spray One to 2 sprays each nostril at bedtime  . Garlic 7989 MG CAPS Take by mouth daily.    Marland Kitchen glimepiride (AMARYL) 2 MG tablet TAKE 1 TABLET  DAILY BEFORE BREAKFAST.  Marland Kitchen glucose blood test strip Check BS TID and PRN.DX.E11.9  . Insulin Detemir (LEVEMIR FLEXTOUCH) 100 UNIT/ML Pen Inject up to 50 units once daily  . levothyroxine (SYNTHROID, LEVOTHROID) 75 MCG tablet Take 1 tablet (75 mcg total) by mouth daily.  . metFORMIN (GLUCOPHAGE) 1000 MG tablet Take 1 tablet (1,000 mg total) by mouth 2 (two) times daily with a meal.  . quinapril (ACCUPRIL) 20 MG tablet Take 1 tablet (20 mg total) by mouth at bedtime.  . ranitidine (ZANTAC) 150 MG tablet Take 1 tablet (150 mg total) by mouth as needed.  Marland Kitchen UNABLE TO FIND Apply 1 application topically daily. Diabetic skin relief foot cream  . BESIVANCE 0.6 % SUSP   . gatifloxacin (ZYMAXID) 0.5 % SOLN   . prednisoLONE acetate (PRED FORTE) 1 % ophthalmic suspension    No facility-administered encounter medications on file as of 08/05/2016.       Review of Systems  Constitutional:  Negative.   HENT: Negative.   Eyes: Negative.   Respiratory: Negative.   Cardiovascular: Negative.   Gastrointestinal: Negative.   Endocrine: Negative.        Elevated BS - due to steroid eye drops  Genitourinary: Negative.   Musculoskeletal: Negative.   Skin: Negative.   Allergic/Immunologic: Negative.   Neurological: Negative.   Hematological: Negative.   Psychiatric/Behavioral: Negative.        Objective:   Physical Exam  Constitutional: He is oriented to person, place, and time. He appears well-developed and  well-nourished. No distress.  Pleasant and alert  HENT:  Head: Normocephalic and atraumatic.  Right Ear: External ear normal.  Left Ear: External ear normal.  Mouth/Throat: Oropharynx is clear and moist. No oropharyngeal exudate.  Slight nasal congestion left greater than right  Eyes: Conjunctivae and EOM are normal. Pupils are equal, round, and reactive to light. Right eye exhibits no discharge. Left eye exhibits no discharge. No scleral icterus.  Neck: Normal range of motion. Neck supple. No thyromegaly present.  No bruits thyromegaly or anterior cervical adenopathy  Cardiovascular: Normal rate, regular rhythm, normal heart sounds and intact distal pulses.   No murmur heard. Posterior tibial pulses were present bilaterally and dorsalis pedis pulses were diminished bilaterally Heart has a regular rate and rhythm at 72/m  Pulmonary/Chest: Effort normal and breath sounds normal. No respiratory distress. He has no wheezes. He has no rales. He exhibits no tenderness.  Clear anteriorly and posteriorly  Abdominal: Soft. Bowel sounds are normal. He exhibits no mass. There is no tenderness. There is no rebound and no guarding.  No abdominal tenderness masses bruits organ enlargement.  Musculoskeletal: Normal range of motion. He exhibits no edema.  Lymphadenopathy:    He has no cervical adenopathy.  Neurological: He is alert and oriented to person, place, and time. He has normal reflexes. No cranial nerve deficit.  Skin: Skin is warm and dry. No rash noted.  On examining the patient today a small tick was embedded in the area of the right shoulder blade this was removed without problems. The patient was unaware that this was present.  Psychiatric: He has a normal mood and affect. His behavior is normal. Judgment and thought content normal.  Nursing note and vitals reviewed.  BP 131/81 (BP Location: Left Arm)   Pulse 71   Temp (!) 96.8 F (36 C) (Oral)   Ht _0  (1.753 m)   Wt 171 lb (77.6  kg)   BMI 25.25 kg/m         Assessment & Plan:  1. Type 2 diabetes mellitus treated with insulin (HCC) -Monitor blood sugars closely - CBC with Differential/Platelet - Bayer DCA Hb A1c Waived  2. Hyperlipidemia -Continue current treatment pending results of lab work - CBC with Differential/Platelet - Lipid panel  3. Essential hypertension -The blood pressure is good today and he will continue with current treatment - CBC with Differential/Platelet - BMP8+EGFR - Hepatic function panel  4. Vitamin D deficiency -Continue with current treatment pending results of lab work - CBC with Differential/Platelet - VITAMIN D 25 Hydroxy (Vit-D Deficiency, Fractures)  5. Family history of heart disease -No history of any chest pain or pressure. - CBC with Differential/Platelet  6. History of BPH -No complaints today with voiding - CBC with Differential/Platelet  7. Seasonal allergic rhinitis due to pollen -Use Flonase regularly and avoid irritating environments if possible - fluticasone (FLONASE) 50 MCG/ACT nasal spray; One to 2 sprays each nostril at bedtime  Dispense: 42 g; Refill: 3 - CBC with Differential/Platelet  8. Peripheral vascular insufficiency (HCC) -Diminished dorsalis pedis pulses bilaterally  9. Tick bite, initial encounter -Doxycycline 100 mg twice daily with food for 2 weeks. - Lyme Ab/Western Blot Reflex - Rocky mtn spotted fvr abs pnl(IgG+IgM)  Meds ordered this encounter  Medications  . BESIVANCE 0.6 % SUSP  . gatifloxacin (ZYMAXID) 0.5 % SOLN  . prednisoLONE acetate (PRED FORTE) 1 % ophthalmic suspension  . glimepiride (AMARYL) 2 MG tablet    Sig: Take 1 tablet (2 mg total) by mouth daily before breakfast.    Dispense:  90 tablet    Refill:  3  . levothyroxine (SYNTHROID, LEVOTHROID) 75 MCG tablet    Sig: Take 1 tablet (75 mcg total) by mouth daily.    Dispense:  90 tablet    Refill:  3  . metFORMIN (GLUCOPHAGE) 1000 MG tablet    Sig: Take 1  tablet (1,000 mg total) by mouth 2 (two) times daily with a meal.    Dispense:  180 tablet    Refill:  3  . quinapril (ACCUPRIL) 20 MG tablet    Sig: Take 1 tablet (20 mg total) by mouth at bedtime.    Dispense:  90 tablet    Refill:  3  . fluticasone (FLONASE) 50 MCG/ACT nasal spray    Sig: One to 2 sprays each nostril at bedtime    Dispense:  42 g    Refill:  3  . doxycycline (VIBRA-TABS) 100 MG tablet    Sig: Take 1 tablet (100 mg total) by mouth 2 (two) times daily. 1 po bid    Dispense:  42 tablet    Refill:  1   Patient Instructions                       Medicare Annual Wellness Visit  [place] and the medical providers at [facility] strive to bring you the best medical care.  In doing so we not only want to address your current medical conditions and concerns but also to detect new conditions early and prevent illness, disease and health-related problems.    Medicare offers a yearly Wellness Visit which allows our clinical staff to assess your need for preventative services including immunizations, lifestyle education, counseling to decrease risk of preventable diseases and screening for fall risk and other medical concerns.    This visit is provided free of charge (no copay) for all Medicare recipients. The clinical pharmacists at [facility] have begun to conduct these Wellness Visits which will also include a thorough review of all your medications.    As you primary medical provider recommend that you make an appointment for your Annual Wellness Visit if you have not done so already this year.  You may set up this appointment before you leave today or you may call back ([phone]) and schedule an appointment.  Please make sure when you call that you mention that you are scheduling your Annual Wellness Visit with the clinical pharmacist so that the appointment may be made for the proper length of time.     Continue current  medications. Continue good therapeutic lifestyle changes which include good diet and exercise. Fall precautions discussed with patient. If an FOBT was given today- please return it to our front desk. If you are over 26 years old - you may need Prevnar 78 or the adult Pneumonia vaccine.  **Flu shots are available---  please call and schedule a FLU-CLINIC appointment**  After your visit with [place] today you will receive a survey in the mail or online from [employer] regarding your care with [place]. Please take a moment to fill this out. Your feedback is very important to [place] as you can help [place] better understand your patient needs as well as improve your experience and satisfaction. WE CARE ABOUT YOU!!!     Arrie Senate MD

## 2016-08-06 LAB — LIPID PANEL
CHOLESTEROL TOTAL: 118 mg/dL (ref 100–199)
Chol/HDL Ratio: 2.6 ratio (ref 0.0–5.0)
HDL: 46 mg/dL (ref >39)
LDL CALC: 64 mg/dL (ref 0–99)
Triglycerides: 40 mg/dL (ref 0–149)
VLDL Cholesterol Cal: 8 mg/dL (ref 5–40)

## 2016-08-06 LAB — HEPATIC FUNCTION PANEL
ALBUMIN: 4.4 g/dL (ref 3.5–4.8)
ALK PHOS: 66 IU/L (ref 39–117)
ALT: 18 IU/L (ref 0–44)
AST: 15 IU/L (ref 0–40)
BILIRUBIN, DIRECT: 0.11 mg/dL (ref 0.00–0.40)
Bilirubin Total: 0.3 mg/dL (ref 0.0–1.2)
Total Protein: 6.6 g/dL (ref 6.0–8.5)

## 2016-08-06 LAB — CBC WITH DIFFERENTIAL/PLATELET
BASOS: 0 %
Basophils Absolute: 0 10*3/uL (ref 0.0–0.2)
EOS (ABSOLUTE): 0.1 10*3/uL (ref 0.0–0.4)
EOS: 2 %
HEMATOCRIT: 41.6 % (ref 37.5–51.0)
HEMOGLOBIN: 14.1 g/dL (ref 13.0–17.7)
Immature Grans (Abs): 0.1 10*3/uL (ref 0.0–0.1)
Immature Granulocytes: 1 %
LYMPHS ABS: 1.4 10*3/uL (ref 0.7–3.1)
Lymphs: 20 %
MCH: 28 pg (ref 26.6–33.0)
MCHC: 33.9 g/dL (ref 31.5–35.7)
MCV: 83 fL (ref 79–97)
MONOCYTES: 8 %
Monocytes Absolute: 0.6 10*3/uL (ref 0.1–0.9)
NEUTROS ABS: 4.8 10*3/uL (ref 1.4–7.0)
Neutrophils: 69 %
Platelets: 197 10*3/uL (ref 150–379)
RBC: 5.03 x10E6/uL (ref 4.14–5.80)
RDW: 14.9 % (ref 12.3–15.4)
WBC: 6.8 10*3/uL (ref 3.4–10.8)

## 2016-08-06 LAB — BMP8+EGFR
BUN / CREAT RATIO: 14 (ref 10–24)
BUN: 13 mg/dL (ref 8–27)
CO2: 24 mmol/L (ref 18–29)
Calcium: 9.9 mg/dL (ref 8.6–10.2)
Chloride: 102 mmol/L (ref 96–106)
Creatinine, Ser: 0.93 mg/dL (ref 0.76–1.27)
GFR calc Af Amer: 93 mL/min/{1.73_m2} (ref >59)
GFR, EST NON AFRICAN AMERICAN: 80 mL/min/{1.73_m2} (ref >59)
Glucose: 153 mg/dL — ABNORMAL HIGH (ref 65–99)
Potassium: 4.2 mmol/L (ref 3.5–5.2)
Sodium: 143 mmol/L (ref 134–144)

## 2016-08-06 LAB — VITAMIN D 25 HYDROXY (VIT D DEFICIENCY, FRACTURES): VIT D 25 HYDROXY: 53.7 ng/mL (ref 30.0–100.0)

## 2016-08-06 LAB — LYME AB/WESTERN BLOT REFLEX: LYME DISEASE AB, QUANT, IGM: <0.8 index (ref 0.00–0.79)

## 2016-08-06 LAB — ROCKY MTN SPOTTED FVR ABS PNL(IGG+IGM)
RMSF IgG: NEGATIVE
RMSF IgM: 0.22 index (ref 0.00–0.89)

## 2016-08-13 ENCOUNTER — Other Ambulatory Visit: Payer: Medicare PPO

## 2016-08-13 DIAGNOSIS — Z1211 Encounter for screening for malignant neoplasm of colon: Secondary | ICD-10-CM

## 2016-08-14 LAB — FECAL OCCULT BLOOD, IMMUNOCHEMICAL: FECAL OCCULT BLD: NEGATIVE

## 2016-08-23 ENCOUNTER — Encounter: Payer: Self-pay | Admitting: Physician Assistant

## 2016-08-23 ENCOUNTER — Ambulatory Visit (INDEPENDENT_AMBULATORY_CARE_PROVIDER_SITE_OTHER): Payer: Medicare PPO | Admitting: Physician Assistant

## 2016-08-23 VITALS — BP 151/82 | HR 85 | Temp 97.4°F | Ht 69.0 in | Wt 171.6 lb

## 2016-08-23 DIAGNOSIS — B029 Zoster without complications: Secondary | ICD-10-CM

## 2016-08-23 MED ORDER — HYDROCODONE-ACETAMINOPHEN 5-325 MG PO TABS: 1.0000 | ORAL_TABLET | Freq: Four times a day (QID) | ORAL | 0 refills | Status: DC | PRN

## 2016-08-23 MED ORDER — GABAPENTIN 100 MG PO CAPS: 100.0000 mg | ORAL_CAPSULE | Freq: Three times a day (TID) | ORAL | 3 refills | Status: DC

## 2016-08-23 MED ORDER — VALACYCLOVIR HCL 500 MG PO TABS: 500.0000 mg | ORAL_TABLET | Freq: Two times a day (BID) | ORAL | 0 refills | Status: DC

## 2016-08-23 NOTE — Progress Notes (Signed)
BP (!) 151/82   Pulse 85   Temp 97.4 F (36.3 C) (Oral)   Ht 5\' 9"  (1.753 m)   Wt 171 lb 9.6 oz (77.8 kg)   BMI 25.34 kg/m    Subjective:    Patient ID: [name] [name], male    DOB: [birth year], 76 y.o.   MRN: [id number]  HPI: [name] [name] is a 76 y.o. male presenting on 08/23/2016 for Herpes Zoster  The patient comes in for rash over past 4 days. It started on his left lower abdomen and in spread around left trunk. It is very red and painful. There are blisters and pain.  He got the vaccine for shingles 2 years ago.    Relevant past medical, surgical, family and social history reviewed and updated as indicated. Allergies and medications reviewed and updated.  Past Medical History:  Diagnosis Date  . Basal cell carcinoma    left forehead, nose, neck  . Cataract   . Diabetes mellitus   . Diverticulitis   . Erectile dysfunction   . Gastroesophageal reflux disease with hiatal hernia   . Goiter, nontoxic, multinodular    with macrocyst  . Hiatal hernia    With reflex  . Hyperlipidemia   . Inguinal hernia    right side  . Kidney stone 1980  . Leg fracture    Left  . Liver hemangioma   . Nephrolithiasis   . Vitamin D deficiency     Past Surgical History:  Procedure Laterality Date  . BACK SURGERY  1984  . CATARACT EXTRACTION Bilateral   . LACERATION REPAIR  2008   Dr. Lenon Curt -to hand  . TIBIA FRACTURE SURGERY     left leg    Review of Systems  Constitutional: Negative.  Negative for appetite change and fatigue.  HENT: Negative.   Eyes: Negative.  Negative for pain and visual disturbance.  Respiratory: Negative.  Negative for cough, chest tightness, shortness of breath and wheezing.   Cardiovascular: Negative.  Negative for chest pain, palpitations and leg swelling.  Gastrointestinal: Negative.  Negative for abdominal pain, diarrhea, nausea and vomiting.  Endocrine: Negative.   Genitourinary: Negative.   Musculoskeletal: Negative.   Skin: Positive for color  change and rash.  Neurological: Negative.  Negative for weakness, numbness and headaches.  Psychiatric/Behavioral: Negative.     Allergies as of 08/23/2016      Reactions   Actos [pioglitazone Hydrochloride] Swelling   Fluid retention   Lisinopril Rash   Invokana [canagliflozin] Rash   Niaspan [niacin Er] Other (See Comments)   Increased blood sugar   Ramipril Diarrhea      Medication List       Accurate as of 08/23/16  8:40 AM. Always use your most recent med list.          aspirin 81 MG tablet Take 81 mg by mouth daily.   BESIVANCE 0.6 % Susp Generic drug:  Besifloxacin HCl   CVS CINNAMON PO Take 2,000 mg by mouth daily.   doxycycline 100 MG tablet Commonly known as:  VIBRA-TABS Take 1 tablet (100 mg total) by mouth 2 (two) times daily. 1 po bid   fluticasone 50 MCG/ACT nasal spray Commonly known as:  FLONASE One to 2 sprays each nostril at bedtime   gabapentin 100 MG capsule Commonly known as:  NEURONTIN Take 1-3 capsules (100-300 mg total) by mouth 3 (three) times daily. Increase by one capsule each week   Garlic 9449 MG Caps Take by mouth  daily.   gatifloxacin 0.5 % Soln Commonly known as:  ZYMAXID   glimepiride 2 MG tablet Commonly known as:  AMARYL Take 1 tablet (2 mg total) by mouth daily before breakfast.   glucose blood test strip Check BS TID and PRN.DX.E11.9   HYDROcodone-acetaminophen 5-325 MG tablet Commonly known as:  NORCO/VICODIN Take 1 tablet by mouth every 6 (six) hours as needed for moderate pain.   Insulin Detemir 100 UNIT/ML Pen Commonly known as:  LEVEMIR FLEXTOUCH Inject up to 50 units once daily   levothyroxine 75 MCG tablet Commonly known as:  SYNTHROID, LEVOTHROID Take 1 tablet (75 mcg total) by mouth daily.   MEDICATED CHEST RUB 4.73-1.2-2.6 % Oint   metFORMIN 1000 MG tablet Commonly known as:  GLUCOPHAGE Take 1 tablet (1,000 mg total) by mouth 2 (two) times daily with a meal.   prednisoLONE acetate 1 % ophthalmic  suspension Commonly known as:  PRED FORTE   quinapril 20 MG tablet Commonly known as:  ACCUPRIL Take 1 tablet (20 mg total) by mouth at bedtime.   ranitidine 150 MG tablet Commonly known as:  ZANTAC Take 1 tablet (150 mg total) by mouth as needed.   UNABLE TO FIND Apply 1 application topically daily. Diabetic skin relief foot cream   valACYclovir 500 MG tablet Commonly known as:  VALTREX Take 1 tablet (500 mg total) by mouth 2 (two) times daily.   Vitamin D3 5000 units Caps Take 5,000 Units by mouth. Take 1 capsule three times per week          Objective:    BP (!) 151/82   Pulse 85   Temp 97.4 F (36.3 C) (Oral)   Ht 5\' 9"  (1.753 m)   Wt 171 lb 9.6 oz (77.8 kg)   BMI 25.34 kg/m   Allergies  Allergen Reactions  . Actos [Pioglitazone Hydrochloride] Swelling    Fluid retention  . Lisinopril Rash  . Invokana [Canagliflozin] Rash  . Niaspan [Niacin Er] Other (See Comments)    Increased blood sugar  . Ramipril Diarrhea    Physical Exam  Constitutional: He appears well-developed and well-nourished.  HENT:  Head: Normocephalic and atraumatic.  Eyes: Conjunctivae and EOM are normal. Pupils are equal, round, and reactive to light.  Neck: Normal range of motion. Neck supple.  Cardiovascular: Normal rate, regular rhythm and normal heart sounds.   Pulmonary/Chest: Effort normal and breath sounds normal.  Abdominal: Soft. Bowel sounds are normal.  Musculoskeletal: Normal range of motion.  Skin: Skin is warm and dry. Lesion and rash noted. Rash is vesicular. There is erythema.           Assessment & Plan:   1. Herpes zoster without complication - valACYclovir (VALTREX) 500 MG tablet; Take 1 tablet (500 mg total) by mouth 2 (two) times daily.  Dispense: 20 tablet; Refill: 0 - HYDROcodone-acetaminophen (NORCO/VICODIN) 5-325 MG tablet; Take 1 tablet by mouth every 6 (six) hours as needed for moderate pain.  Dispense: 40 tablet; Refill: 0 - gabapentin (NEURONTIN) 100  MG capsule; Take 1-3 capsules (100-300 mg total) by mouth 3 (three) times daily. Increase by one capsule each week  Dispense: 90 capsule; Refill: 3   Current Outpatient Prescriptions:  .  aspirin 81 MG tablet, Take 81 mg by mouth daily.  , Disp: , Rfl:  .  BESIVANCE 0.6 % SUSP, , Disp: , Rfl:  .  Camphor-Eucalyptus-Menthol (MEDICATED CHEST RUB) 4.73-1.2-2.6 % OINT, , Disp: , Rfl:  .  Cholecalciferol (VITAMIN D3) 5000 UNITS  CAPS, Take 5,000 Units by mouth. Take 1 capsule three times per week, Disp: , Rfl:  .  CVS CINNAMON PO, Take 2,000 mg by mouth daily.  , Disp: , Rfl:  .  doxycycline (VIBRA-TABS) 100 MG tablet, Take 1 tablet (100 mg total) by mouth 2 (two) times daily. 1 po bid, Disp: 42 tablet, Rfl: 1 .  fluticasone (FLONASE) 50 MCG/ACT nasal spray, One to 2 sprays each nostril at bedtime, Disp: 42 g, Rfl: 3 .  Garlic 9371 MG CAPS, Take by mouth daily.  , Disp: , Rfl:  .  gatifloxacin (ZYMAXID) 0.5 % SOLN, , Disp: , Rfl:  .  glimepiride (AMARYL) 2 MG tablet, Take 1 tablet (2 mg total) by mouth daily before breakfast., Disp: 90 tablet, Rfl: 3 .  Insulin Detemir (LEVEMIR FLEXTOUCH) 100 UNIT/ML Pen, Inject up to 50 units once daily, Disp: 45 mL, Rfl: 3 .  levothyroxine (SYNTHROID, LEVOTHROID) 75 MCG tablet, Take 1 tablet (75 mcg total) by mouth daily., Disp: 90 tablet, Rfl: 3 .  metFORMIN (GLUCOPHAGE) 1000 MG tablet, Take 1 tablet (1,000 mg total) by mouth 2 (two) times daily with a meal., Disp: 180 tablet, Rfl: 3 .  prednisoLONE acetate (PRED FORTE) 1 % ophthalmic suspension, , Disp: , Rfl:  .  quinapril (ACCUPRIL) 20 MG tablet, Take 1 tablet (20 mg total) by mouth at bedtime., Disp: 90 tablet, Rfl: 3 .  ranitidine (ZANTAC) 150 MG tablet, Take 1 tablet (150 mg total) by mouth as needed., Disp: 90 tablet, Rfl: 1 .  UNABLE TO FIND, Apply 1 application topically daily. Diabetic skin relief foot cream, Disp: , Rfl:  .  gabapentin (NEURONTIN) 100 MG capsule, Take 1-3 capsules (100-300 mg total) by  mouth 3 (three) times daily. Increase by one capsule each week, Disp: 90 capsule, Rfl: 3 .  glucose blood test strip, Check BS TID and PRN.DX.E11.9, Disp: 100 each, Rfl: 11 .  HYDROcodone-acetaminophen (NORCO/VICODIN) 5-325 MG tablet, Take 1 tablet by mouth every 6 (six) hours as needed for moderate pain., Disp: 40 tablet, Rfl: 0 .  valACYclovir (VALTREX) 500 MG tablet, Take 1 tablet (500 mg total) by mouth 2 (two) times daily., Disp: 20 tablet, Rfl: 0  Continue all other maintenance medications as listed above.  Follow up plan: Return if symptoms worsen or fail to improve.  Educational handout given for shingles  Terald Sleeper PA-C [facility] [address]  [place], [place] [address] [phone]   08/23/2016, 8:40 AM

## 2016-08-23 NOTE — Patient Instructions (Signed)
Shingles Shingles is an infection that causes a painful skin rash and fluid-filled blisters. Shingles is caused by the same virus that causes chickenpox. Shingles only develops in people who:  Have had chickenpox.  Have gotten the chickenpox vaccine. (This is rare.) The first symptoms of shingles may be itching, tingling, or pain in an area on your skin. A rash will follow in a few days or weeks. The rash is usually on one side of the body in a bandlike or beltlike pattern. Over time, the rash turns into fluid-filled blisters that break open, scab over, and dry up. Medicines may:  Help you manage pain.  Help you recover more quickly.  Help to prevent long-term problems. Follow these instructions at home: Medicines  Take medicines only as told by your doctor.  Apply an anti-itch or numbing cream to the affected area as told by your doctor. Blister and Rash Care  Take a cool bath or put cool compresses on the area of the rash or blisters as told by your doctor. This may help with pain and itching.  Keep your rash covered with a loose bandage (dressing). Wear loose-fitting clothing.  Keep your rash and blisters clean with mild soap and cool water or as told by your doctor.  Check your rash every day for signs of infection. These include redness, swelling, and pain that lasts or gets worse.  Do not pick your blisters.  Do not scratch your rash. General instructions  Rest as told by your doctor.  Keep all follow-up visits as told by your doctor. This is important.  Until your blisters scab over, your infection can cause chickenpox in people who have never had it or been vaccinated against it. To prevent this from happening, avoid touching other people or being around other people, especially:  Babies.  Pregnant women.  Children who have eczema.  Elderly people who have transplants.  People who have chronic illnesses, such as leukemia or AIDS. Contact a doctor if:  Your  pain does not get better with medicine.  Your pain does not get better after the rash heals.  Your rash looks infected. Signs of infection include:  Redness.  Swelling.  Pain that lasts or gets worse. Get help right away if:  The rash is on your face or nose.  You have pain in your face, pain around your eye area, or loss of feeling on one side of your face.  You have ear pain or you have ringing in your ear.  You have loss of taste.  Your condition gets worse. This information is not intended to replace advice given to you by your health care provider. Make sure you discuss any questions you have with your health care provider. Document Released: 09/22/2007 Document Revised: 11/30/2015 Document Reviewed: 01/15/2014 Elsevier Interactive Patient Education  2017 [employer]  

## 2016-09-06 ENCOUNTER — Ambulatory Visit (INDEPENDENT_AMBULATORY_CARE_PROVIDER_SITE_OTHER): Payer: Medicare PPO | Admitting: Family Medicine

## 2016-09-06 ENCOUNTER — Encounter: Payer: Self-pay | Admitting: Family Medicine

## 2016-09-06 VITALS — BP 132/74 | HR 79 | Temp 97.0°F | Ht 69.0 in | Wt 172.0 lb

## 2016-09-06 DIAGNOSIS — Z794 Long term (current) use of insulin: Secondary | ICD-10-CM

## 2016-09-06 DIAGNOSIS — E119 Type 2 diabetes mellitus without complications: Secondary | ICD-10-CM | POA: Diagnosis not present

## 2016-09-06 DIAGNOSIS — B029 Zoster without complications: Secondary | ICD-10-CM | POA: Diagnosis not present

## 2016-09-06 NOTE — Progress Notes (Signed)
Subjective:    Patient ID: [name] [name], male    DOB: October 06, 1940, 76 y.o.   MRN: [id number]  HPI Patient here today for 4 week follow up on diabetes, statin medication and recent shingles. Patient has finished prednisolone eyedrops and blood sugars seem to be better according to him. He is also recently developed shingles. He is taking Zovirax for this. Even though it is important for diabetics to be on a statin drug we will not going to start a statin drug because his cholesterol numbers are excellent and at goal without starting him on anything for this. Recent cholesterol numbers have a total cholesterol at 118 triglycerides at 40 and LDL C cholesterol is 64 which is good and at goal. He is still hurting on his left side where he is had a shingles. The lesions are now drying up and he is finished all his medicine. The natural course of shingles was reviewed with him during the visit today. The recent blood sugar at home was 88 before dinner at nighttime but he had one that was as high as 74 fasting but this is when he is still on the prednisolone eyedrops. He has had some constipation with the shingles and with the medicine he is taking for pain.    Patient Active Problem List   Diagnosis Date Noted  . Peripheral vascular insufficiency ([place]) 03/18/2016  . Family history of heart disease 03/18/2016  . Right inguinal hernia 12/13/2013  . Essential hypertension 12/13/2013  . BPH (benign prostatic hyperplasia) 12/13/2013  . Hypothyroidism 08/07/2013  . Vitamin D deficiency 04/02/2013  . Goiter, nontoxic, multinodular   . Kidney stone   . Hyperlipidemia 01/15/2009  . ABNORMAL ELECTROCARDIOGRAM 01/15/2009  . Type 2 diabetes mellitus treated with insulin ([place]) 01/09/2009  . HIATAL HERNIA 01/09/2009  . BASAL CELL CARCINOMA, HX OF 01/09/2009  . DIVERTICULITIS, HX OF 01/09/2009   Outpatient Encounter Prescriptions as of 09/06/2016  Medication Sig  . aspirin 81 MG tablet Take 81 mg by mouth  daily.    Marland Kitchen BESIVANCE 0.6 % SUSP   . Camphor-Eucalyptus-Menthol (MEDICATED CHEST RUB) 4.73-1.2-2.6 % OINT   . Cholecalciferol (VITAMIN D3) 5000 UNITS CAPS Take 5,000 Units by mouth. Take 1 capsule three times per week  . [facility] CINNAMON PO Take 2,000 mg by mouth daily.    . fluticasone (FLONASE) 50 MCG/ACT nasal spray One to 2 sprays each nostril at bedtime  . gabapentin (NEURONTIN) 100 MG capsule Take 1-3 capsules (100-300 mg total) by mouth 3 (three) times daily. Increase by one capsule each week  . Garlic 6767 MG CAPS Take by mouth daily.    Marland Kitchen gatifloxacin (ZYMAXID) 0.5 % SOLN   . glimepiride (AMARYL) 2 MG tablet Take 1 tablet (2 mg total) by mouth daily before breakfast.  . glucose blood test strip Check BS TID and PRN.DX.E11.9  . HYDROcodone-acetaminophen (NORCO/VICODIN) 5-325 MG tablet Take 1 tablet by mouth every 6 (six) hours as needed for moderate pain.  . Insulin Detemir (LEVEMIR FLEXTOUCH) 100 UNIT/ML Pen Inject up to 50 units once daily  . levothyroxine (SYNTHROID, LEVOTHROID) 75 MCG tablet Take 1 tablet (75 mcg total) by mouth daily.  . metFORMIN (GLUCOPHAGE) 1000 MG tablet Take 1 tablet (1,000 mg total) by mouth 2 (two) times daily with a meal.  . prednisoLONE acetate (PRED FORTE) 1 % ophthalmic suspension   . quinapril (ACCUPRIL) 20 MG tablet Take 1 tablet (20 mg total) by mouth at bedtime.  . ranitidine (ZANTAC) 150 MG  tablet Take 1 tablet (150 mg total) by mouth as needed.  Marland Kitchen UNABLE TO FIND Apply 1 application topically daily. Diabetic skin relief foot cream  . [DISCONTINUED] doxycycline (VIBRA-TABS) 100 MG tablet Take 1 tablet (100 mg total) by mouth 2 (two) times daily. 1 po bid  . [DISCONTINUED] valACYclovir (VALTREX) 500 MG tablet Take 1 tablet (500 mg total) by mouth 2 (two) times daily.   No facility-administered encounter medications on file as of 09/06/2016.       Review of Systems  Constitutional: Negative.   HENT: Negative.   Eyes: Negative.   Respiratory:  Negative.   Cardiovascular: Negative.   Gastrointestinal: Negative.   Endocrine: Negative.   Genitourinary: Negative.   Musculoskeletal: Negative.   Skin: Negative.        Rash and pain - left side  Allergic/Immunologic: Negative.   Neurological: Negative.   Hematological: Negative.   Psychiatric/Behavioral: Negative.        Objective:   Physical Exam  Constitutional: He is oriented to person, place, and time. He appears well-developed and well-nourished.  HENT:  Head: Normocephalic.  Eyes: Conjunctivae and EOM are normal. Pupils are equal, round, and reactive to light. Right eye exhibits no discharge. Left eye exhibits no discharge. No scleral icterus.  Neck: Normal range of motion.  Cardiovascular: Normal rate, regular rhythm and normal heart sounds.   No murmur heard. Pulmonary/Chest: Effort normal and breath sounds normal. No respiratory distress. He has no wheezes. He has no rales.  Abdominal: Soft. Bowel sounds are normal. He exhibits no mass. There is tenderness. There is no rebound and no guarding.  Minimal tenderness left lateral abdomen over the shingles rash.  Musculoskeletal: Normal range of motion. He exhibits no edema.  Slight swelling over the area of where the shingles are located.  Lymphadenopathy:    He has no cervical adenopathy.  Neurological: He is alert and oriented to person, place, and time. He has normal reflexes. No cranial nerve deficit.  Skin: Skin is warm and dry. Rash noted. There is erythema. No pallor.  Psychiatric: He has a normal mood and affect. His behavior is normal. Judgment and thought content normal.  Nursing note and vitals reviewed.  BP 132/74 (BP Location: Left Arm)   Pulse 79   Temp 97 F (36.1 C) (Oral)   Ht 5\' 9"  (1.753 m)   Wt 172 lb (78 kg)   BMI 25.40 kg/m         Assessment & Plan:  1. Herpes zoster without complication -Take gabapentin as needed  2. Type 2 diabetes mellitus treated with insulin (HCC) -Continue to  monitor blood sugars closely and since he's finish the prednisolone were hoping this will help bring his average blood sugar down to a more satisfactory level. -His recent lab work was reviewed with him during the visit today and he is given a copy of this to take home with him.  Patient Instructions  Take gabapentin as needed Take pain medicine as needed Reassurance that the pain from the shingles should clear over time with only occasional flares when patient is very physically active or if he is running a fever from a virus. These flares would just be noted as pain.  Arrie Senate MD

## 2016-09-06 NOTE — Patient Instructions (Signed)
Take gabapentin as needed Take pain medicine as needed Reassurance that the pain from the shingles should clear over time with only occasional flares when patient is very physically active or if he is running a fever from a virus. These flares would just be noted as pain.

## 2016-10-01 ENCOUNTER — Telehealth: Payer: Self-pay | Admitting: Family Medicine

## 2016-10-01 DIAGNOSIS — B029 Zoster without complications: Secondary | ICD-10-CM

## 2016-10-01 MED ORDER — GABAPENTIN 300 MG PO CAPS: 300.0000 mg | ORAL_CAPSULE | Freq: Three times a day (TID) | ORAL | 1 refills | Status: DC

## 2016-10-01 NOTE — Telephone Encounter (Signed)
Needed rf on gabapentin

## 2016-10-26 ENCOUNTER — Encounter: Payer: Self-pay | Admitting: *Deleted

## 2016-11-20 DIAGNOSIS — E039 Hypothyroidism, unspecified: Secondary | ICD-10-CM | POA: Diagnosis not present

## 2016-11-20 DIAGNOSIS — Z794 Long term (current) use of insulin: Secondary | ICD-10-CM | POA: Diagnosis not present

## 2016-11-20 DIAGNOSIS — E1165 Type 2 diabetes mellitus with hyperglycemia: Secondary | ICD-10-CM | POA: Diagnosis not present

## 2016-11-20 DIAGNOSIS — Z6824 Body mass index (BMI) 24.0-24.9, adult: Secondary | ICD-10-CM | POA: Diagnosis not present

## 2016-11-20 DIAGNOSIS — I10 Essential (primary) hypertension: Secondary | ICD-10-CM | POA: Diagnosis not present

## 2016-12-13 ENCOUNTER — Ambulatory Visit: Payer: Medicare PPO | Admitting: Family Medicine

## 2016-12-14 ENCOUNTER — Encounter: Payer: Self-pay | Admitting: Family Medicine

## 2016-12-14 ENCOUNTER — Ambulatory Visit (INDEPENDENT_AMBULATORY_CARE_PROVIDER_SITE_OTHER): Payer: Medicare PPO | Admitting: Family Medicine

## 2016-12-14 VITALS — BP 140/80 | HR 63 | Temp 96.7°F | Ht 69.0 in | Wt 174.0 lb

## 2016-12-14 DIAGNOSIS — E782 Mixed hyperlipidemia: Secondary | ICD-10-CM | POA: Diagnosis not present

## 2016-12-14 DIAGNOSIS — Z8249 Family history of ischemic heart disease and other diseases of the circulatory system: Secondary | ICD-10-CM | POA: Diagnosis not present

## 2016-12-14 DIAGNOSIS — I10 Essential (primary) hypertension: Secondary | ICD-10-CM

## 2016-12-14 DIAGNOSIS — E559 Vitamin D deficiency, unspecified: Secondary | ICD-10-CM | POA: Diagnosis not present

## 2016-12-14 DIAGNOSIS — B0229 Other postherpetic nervous system involvement: Secondary | ICD-10-CM | POA: Diagnosis not present

## 2016-12-14 DIAGNOSIS — B029 Zoster without complications: Secondary | ICD-10-CM | POA: Diagnosis not present

## 2016-12-14 DIAGNOSIS — E119 Type 2 diabetes mellitus without complications: Secondary | ICD-10-CM

## 2016-12-14 DIAGNOSIS — Z87438 Personal history of other diseases of male genital organs: Secondary | ICD-10-CM

## 2016-12-14 DIAGNOSIS — Z794 Long term (current) use of insulin: Secondary | ICD-10-CM

## 2016-12-14 LAB — BAYER DCA HB A1C WAIVED: HB A1C (BAYER DCA - WAIVED): 10.1 % — ABNORMAL HIGH (ref <7.0)

## 2016-12-14 MED ORDER — GABAPENTIN 100 MG PO CAPS: 100.0000 mg | ORAL_CAPSULE | Freq: Every day | ORAL | 0 refills | Status: DC

## 2016-12-14 NOTE — Progress Notes (Signed)
Subjective:    Patient ID: [name] [name], male    DOB: [birth year], 76 y.o.   MRN: [id number]  HPI Pt here for follow up and management of chronic medical problems. Which includes diabetes, hyperlipidemia and hypothyroid. He is taking medication regularly.The patient comes to the visit today for his regular checkups. He brings in blood sugars for review and overall is usual these are very good running in the 1:30 to 148 range overall. This would be scanned into the record. The patient denies any chest pain or shortness of breath. He denies any trouble with his stomach or change in bowel habits. No heartburn indigestion nausea vomiting diarrhea or blood in the stool. He is passing his water well other than frequency and he does drink lots of fluids. He has not been checking his blood pressures at home. This was rechecked here on 2 different occasions in the number and the right arm sitting with a large cuff was 140/80 in the left arm sitting large cuff was 150/82. The patient will check some blood pressures for [place] in addition to continuing to monitor his blood sugars. He is due to get lab work today.     Patient Active Problem List   Diagnosis Date Noted  . Peripheral vascular insufficiency ([place]) 03/18/2016  . Family history of heart disease 03/18/2016  . Right inguinal hernia 12/13/2013  . Essential hypertension 12/13/2013  . BPH (benign prostatic hyperplasia) 12/13/2013  . Hypothyroidism 08/07/2013  . Vitamin D deficiency 04/02/2013  . Goiter, nontoxic, multinodular   . Kidney stone   . Hyperlipidemia 01/15/2009  . ABNORMAL ELECTROCARDIOGRAM 01/15/2009  . Type 2 diabetes mellitus treated with insulin ([place]) 01/09/2009  . HIATAL HERNIA 01/09/2009  . BASAL CELL CARCINOMA, HX OF 01/09/2009  . DIVERTICULITIS, HX OF 01/09/2009   Outpatient Encounter Prescriptions as of 12/14/2016  Medication Sig  . aspirin 81 MG tablet Take 81 mg by mouth daily.    Marland Kitchen BESIVANCE 0.6 % SUSP   .  Camphor-Eucalyptus-Menthol (MEDICATED CHEST RUB) 4.73-1.2-2.6 % OINT   . Cholecalciferol (VITAMIN D3) 5000 UNITS CAPS Take 5,000 Units by mouth. Take 1 capsule three times per week  . CVS CINNAMON PO Take 2,000 mg by mouth daily.    . fluticasone (FLONASE) 50 MCG/ACT nasal spray One to 2 sprays each nostril at bedtime  . gabapentin (NEURONTIN) 300 MG capsule Take 1 capsule (300 mg total) by mouth 3 (three) times daily. Increase by one capsule each week  . Garlic 6468 MG CAPS Take by mouth daily.    Marland Kitchen gatifloxacin (ZYMAXID) 0.5 % SOLN   . glimepiride (AMARYL) 2 MG tablet Take 1 tablet (2 mg total) by mouth daily before breakfast.  . glucose blood test strip Check BS TID and PRN.DX.E11.9  . HYDROcodone-acetaminophen (NORCO/VICODIN) 5-325 MG tablet Take 1 tablet by mouth every 6 (six) hours as needed for moderate pain.  . Insulin Detemir (LEVEMIR FLEXTOUCH) 100 UNIT/ML Pen Inject up to 50 units once daily  . levothyroxine (SYNTHROID, LEVOTHROID) 75 MCG tablet Take 1 tablet (75 mcg total) by mouth daily.  . metFORMIN (GLUCOPHAGE) 1000 MG tablet Take 1 tablet (1,000 mg total) by mouth 2 (two) times daily with a meal.  . quinapril (ACCUPRIL) 20 MG tablet Take 1 tablet (20 mg total) by mouth at bedtime.  . ranitidine (ZANTAC) 150 MG tablet Take 1 tablet (150 mg total) by mouth as needed.  Marland Kitchen UNABLE TO FIND Apply 1 application topically daily. Diabetic skin relief foot cream  No facility-administered encounter medications on file as of 12/14/2016.       Review of Systems  Constitutional: Negative.   HENT: Negative.   Eyes: Negative.   Respiratory: Negative.   Cardiovascular: Negative.   Gastrointestinal: Negative.   Endocrine: Negative.   Genitourinary: Negative.   Musculoskeletal: Negative.   Skin: Negative.   Allergic/Immunologic: Negative.   Neurological: Negative.   Hematological: Negative.   Psychiatric/Behavioral: Negative.        Objective:   Physical Exam  Constitutional: He  is oriented to person, place, and time. He appears well-developed and well-nourished. No distress.  The patient is pleasant and alert and has been less active because of the post shingles pain.  HENT:  Head: Normocephalic and atraumatic.  Right Ear: External ear normal.  Left Ear: External ear normal.  Nose: Nose normal.  Mouth/Throat: Oropharynx is clear and moist. No oropharyngeal exudate.  Eyes: Pupils are equal, round, and reactive to light. Conjunctivae and EOM are normal. Right eye exhibits no discharge. Left eye exhibits no discharge. No scleral icterus.  Neck: Normal range of motion. Neck supple. No thyromegaly present.  No bruits thyromegaly or anterior cervical adenopathy  Cardiovascular: Normal rate, regular rhythm, normal heart sounds and intact distal pulses.   No murmur heard. Heart has a regular rate and rhythm at 60/m  Pulmonary/Chest: Effort normal and breath sounds normal. No respiratory distress. He has no wheezes. He has no rales. He exhibits no tenderness.  Clear anteriorly and posteriorly without axillary adenopathy  Abdominal: Soft. Bowel sounds are normal. He exhibits no mass. There is no tenderness. There is no rebound and no guarding.  No liver or spleen enlargement no epigastric tenderness no masses and no bruits  Musculoskeletal: Normal range of motion. He exhibits no edema or tenderness.  Lymphadenopathy:    He has no cervical adenopathy.  Neurological: He is alert and oriented to person, place, and time. He has normal reflexes. No cranial nerve deficit.  Skin: Skin is warm and dry. Rash noted. No erythema.  The resolving shingles rash around his left lower side from his back is still extremely sensitive even though the rash apparently has dried completely. Would ask him to be patient and try to resume his normal activity as much as possible.  Psychiatric: He has a normal mood and affect. His behavior is normal. Judgment and thought content normal.  Nursing note  and vitals reviewed.   BP (!) 152/77 (BP Location: Left Arm)   Pulse 63   Temp (!) 96.7 F (35.9 C) (Oral)   Ht _0  (1.753 m)   Wt 174 lb (78.9 kg)   BMI 25.70 kg/m         Assessment & Plan:  1. Hyperlipidemia -Continue with aggressive therapeutic lifestyle changes pending results of lab work. Continue with statin drug pending results of lab work. - CBC with Differential/Platelet - Lipid panel  2. Type 2 diabetes mellitus treated with insulin (HCC) -Increase activity level and watch diet more closely - CBC with Differential/Platelet - Bayer DCA Hb A1c Waived  3. Essential hypertension -Blood pressure is elevated today more than usual but patient has not been as active. He is encouraged to watch his sodium intake and increase his activity and monitor the blood pressure more frequently at home. - BMP8+EGFR - CBC with Differential/Platelet - Hepatic function panel  4. Vitamin D deficiency -Continue current treatment pending results of lab work - CBC with Differential/Platelet - VITAMIN D 25 Hydroxy (Vit-D Deficiency, Fractures)  5. Family history of heart disease -Continue with aggressive therapeutic lifestyle changes good blood sugar control and good blood pressure control. - CBC with Differential/Platelet - Lipid panel  6. History of BPH -No complaints with voiding other than frequency and this is why we will check a urinalysis today. - CBC with Differential/Platelet  7. Herpes zoster without complication -The patient says the gabapentin has not helped him at all he will therefore taper off of this until he is off of it completely. - gabapentin (NEURONTIN) 100 MG capsule; Take 1 capsule (100 mg total) by mouth daily. As directed  Dispense: 15 capsule; Refill: 0  8. Postherpetic neuralgia -Time and reassurance.  Meds ordered this encounter  Medications  . gabapentin (NEURONTIN) 100 MG capsule    Sig: Take 1 capsule (100 mg total) by mouth daily. As directed     Dispense:  15 capsule    Refill:  0   Patient Instructions                       Medicare Annual Wellness Visit  [facility] and the medical providers at [facility] strive to bring you the best medical care.  In doing so we not only want to address your current medical conditions and concerns but also to detect new conditions early and prevent illness, disease and health-related problems.    Medicare offers a yearly Wellness Visit which allows our clinical staff to assess your need for preventative services including immunizations, lifestyle education, counseling to decrease risk of preventable diseases and screening for fall risk and other medical concerns.    This visit is provided free of charge (no copay) for all Medicare recipients. The clinical pharmacists at [facility] have begun to conduct these Wellness Visits which will also include a thorough review of all your medications.    As you primary medical provider recommend that you make an appointment for your Annual Wellness Visit if you have not done so already this year.  You may set up this appointment before you leave today or you may call back ([phone]) and schedule an appointment.  Please make sure when you call that you mention that you are scheduling your Annual Wellness Visit with the clinical pharmacist so that the appointment may be made for the proper length of time.     Continue current medications. Continue good therapeutic lifestyle changes which include good diet and exercise. Fall precautions discussed with patient. If an FOBT was given today- please return it to our front desk. If you are over 33 years old - you may need Prevnar 88 or the adult Pneumonia vaccine.  **Flu shots are available--- please call and schedule a FLU-CLINIC appointment**  After your visit with [place] today you will receive a survey in the mail or online from [employer] regarding your care with [place].  Please take a moment to fill this out. Your feedback is very important to [place] as you can help [place] better understand your patient needs as well as improve your experience and satisfaction. WE CARE ABOUT YOU!!!   It is very important that you exercise more Take Tylenol if needed for pain. Reduce gabapentin as directed and take one daily of the 300 mg until you run out of these. Then take the 100 mg twice daily for 1 week. Then take the 100 mg daily 1 week and then every other day until completed.    Arrie Senate MD

## 2016-12-14 NOTE — Patient Instructions (Addendum)
Medicare Annual Wellness Visit  [facility] and the medical providers at [facility] strive to bring you the best medical care.  In doing so we not only want to address your current medical conditions and concerns but also to detect new conditions early and prevent illness, disease and health-related problems.    Medicare offers a yearly Wellness Visit which allows our clinical staff to assess your need for preventative services including immunizations, lifestyle education, counseling to decrease risk of preventable diseases and screening for fall risk and other medical concerns.    This visit is provided free of charge (no copay) for all Medicare recipients. The clinical pharmacists at [facility] have begun to conduct these Wellness Visits which will also include a thorough review of all your medications.    As you primary medical provider recommend that you make an appointment for your Annual Wellness Visit if you have not done so already this year.  You may set up this appointment before you leave today or you may call back [phone]) and schedule an appointment.  Please make sure when you call that you mention that you are scheduling your Annual Wellness Visit with the clinical pharmacist so that the appointment may be made for the proper length of time.     Continue current medications. Continue good therapeutic lifestyle changes which include good diet and exercise. Fall precautions discussed with patient. If an FOBT was given today- please return it to our front desk. If you are over 57 years old - you may need Prevnar 73 or the adult Pneumonia vaccine.  **Flu shots are available--- please call and schedule a FLU-CLINIC appointment**  After your visit with [place] today you will receive a survey in the mail or online from [employer] regarding your care with [place]. Please take a moment to fill this out. Your feedback is very  important to [place] as you can help [place] better understand your patient needs as well as improve your experience and satisfaction. WE CARE ABOUT YOU!!!   It is very important that you exercise more Take Tylenol if needed for pain. Reduce gabapentin as directed and take one daily of the 300 mg until you run out of these. Then take the 100 mg twice daily for 1 week. Then take the 100 mg daily 1 week and then every other day until completed.

## 2016-12-15 LAB — CBC WITH DIFFERENTIAL/PLATELET
BASOS: 0 %
Basophils Absolute: 0 10*3/uL (ref 0.0–0.2)
EOS (ABSOLUTE): 0.2 10*3/uL (ref 0.0–0.4)
EOS: 2 %
HEMATOCRIT: 42.5 % (ref 37.5–51.0)
HEMOGLOBIN: 14.6 g/dL (ref 13.0–17.7)
Immature Grans (Abs): 0.1 10*3/uL (ref 0.0–0.1)
Immature Granulocytes: 1 %
LYMPHS ABS: 2 10*3/uL (ref 0.7–3.1)
Lymphs: 25 %
MCH: 28.5 pg (ref 26.6–33.0)
MCHC: 34.4 g/dL (ref 31.5–35.7)
MCV: 83 fL (ref 79–97)
MONOCYTES: 9 %
MONOS ABS: 0.8 10*3/uL (ref 0.1–0.9)
Neutrophils Absolute: 5.2 10*3/uL (ref 1.4–7.0)
Neutrophils: 63 %
Platelets: 217 10*3/uL (ref 150–379)
RBC: 5.12 x10E6/uL (ref 4.14–5.80)
RDW: 14.1 % (ref 12.3–15.4)
WBC: 8.3 10*3/uL (ref 3.4–10.8)

## 2016-12-15 LAB — BMP8+EGFR
BUN / CREAT RATIO: 11 (ref 10–24)
BUN: 10 mg/dL (ref 8–27)
CO2: 25 mmol/L (ref 20–29)
Calcium: 10.1 mg/dL (ref 8.6–10.2)
Chloride: 99 mmol/L (ref 96–106)
Creatinine, Ser: 0.87 mg/dL (ref 0.76–1.27)
GFR, EST AFRICAN AMERICAN: 97 mL/min/{1.73_m2} (ref >59)
GFR, EST NON AFRICAN AMERICAN: 84 mL/min/{1.73_m2} (ref >59)
Glucose: 170 mg/dL — ABNORMAL HIGH (ref 65–99)
Potassium: 4.4 mmol/L (ref 3.5–5.2)
SODIUM: 139 mmol/L (ref 134–144)

## 2016-12-15 LAB — VITAMIN D 25 HYDROXY (VIT D DEFICIENCY, FRACTURES): VIT D 25 HYDROXY: 50.2 ng/mL (ref 30.0–100.0)

## 2016-12-15 LAB — LIPID PANEL
CHOL/HDL RATIO: 2.9 ratio (ref 0.0–5.0)
Cholesterol, Total: 127 mg/dL (ref 100–199)
HDL: 44 mg/dL (ref >39)
LDL Calculated: 62 mg/dL (ref 0–99)
TRIGLYCERIDES: 105 mg/dL (ref 0–149)
VLDL Cholesterol Cal: 21 mg/dL (ref 5–40)

## 2016-12-15 LAB — HEPATIC FUNCTION PANEL
ALBUMIN: 4.6 g/dL (ref 3.5–4.8)
ALT: 25 IU/L (ref 0–44)
AST: 21 IU/L (ref 0–40)
Alkaline Phosphatase: 68 IU/L (ref 39–117)
BILIRUBIN TOTAL: 0.2 mg/dL (ref 0.0–1.2)
Bilirubin, Direct: 0.09 mg/dL (ref 0.00–0.40)
TOTAL PROTEIN: 6.8 g/dL (ref 6.0–8.5)

## 2017-02-16 ENCOUNTER — Ambulatory Visit (INDEPENDENT_AMBULATORY_CARE_PROVIDER_SITE_OTHER): Payer: Medicare PPO

## 2017-02-16 DIAGNOSIS — Z23 Encounter for immunization: Secondary | ICD-10-CM | POA: Diagnosis not present

## 2017-03-17 DIAGNOSIS — E113292 Type 2 diabetes mellitus with mild nonproliferative diabetic retinopathy without macular edema, left eye: Secondary | ICD-10-CM | POA: Diagnosis not present

## 2017-03-17 DIAGNOSIS — H26493 Other secondary cataract, bilateral: Secondary | ICD-10-CM | POA: Diagnosis not present

## 2017-03-17 LAB — HM DIABETES EYE EXAM

## 2017-04-28 ENCOUNTER — Ambulatory Visit: Payer: Medicare PPO | Admitting: Family Medicine

## 2017-04-28 ENCOUNTER — Encounter: Payer: Self-pay | Admitting: Family Medicine

## 2017-04-28 VITALS — BP 152/75 | HR 64 | Temp 96.7°F | Ht 69.0 in | Wt 167.0 lb

## 2017-04-28 DIAGNOSIS — S60512A Abrasion of left hand, initial encounter: Secondary | ICD-10-CM

## 2017-04-28 DIAGNOSIS — Z794 Long term (current) use of insulin: Secondary | ICD-10-CM

## 2017-04-28 DIAGNOSIS — E782 Mixed hyperlipidemia: Secondary | ICD-10-CM

## 2017-04-28 DIAGNOSIS — J301 Allergic rhinitis due to pollen: Secondary | ICD-10-CM | POA: Diagnosis not present

## 2017-04-28 DIAGNOSIS — Z8249 Family history of ischemic heart disease and other diseases of the circulatory system: Secondary | ICD-10-CM | POA: Diagnosis not present

## 2017-04-28 DIAGNOSIS — I10 Essential (primary) hypertension: Secondary | ICD-10-CM | POA: Diagnosis not present

## 2017-04-28 DIAGNOSIS — E119 Type 2 diabetes mellitus without complications: Secondary | ICD-10-CM

## 2017-04-28 DIAGNOSIS — Z23 Encounter for immunization: Secondary | ICD-10-CM | POA: Diagnosis not present

## 2017-04-28 DIAGNOSIS — Z125 Encounter for screening for malignant neoplasm of prostate: Secondary | ICD-10-CM | POA: Diagnosis not present

## 2017-04-28 DIAGNOSIS — E559 Vitamin D deficiency, unspecified: Secondary | ICD-10-CM | POA: Diagnosis not present

## 2017-04-28 DIAGNOSIS — Z87438 Personal history of other diseases of male genital organs: Secondary | ICD-10-CM | POA: Diagnosis not present

## 2017-04-28 DIAGNOSIS — N400 Enlarged prostate without lower urinary tract symptoms: Secondary | ICD-10-CM | POA: Diagnosis not present

## 2017-04-28 DIAGNOSIS — R8290 Unspecified abnormal findings in urine: Secondary | ICD-10-CM

## 2017-04-28 LAB — URINALYSIS, COMPLETE
BILIRUBIN UA: NEGATIVE
Glucose, UA: NEGATIVE
Ketones, UA: NEGATIVE
Nitrite, UA: POSITIVE — AB
PH UA: 7.5 (ref 5.0–7.5)
PROTEIN UA: NEGATIVE
RBC UA: NEGATIVE
Specific Gravity, UA: 1.02 (ref 1.005–1.030)
UUROB: 0.2 mg/dL (ref 0.2–1.0)

## 2017-04-28 LAB — BAYER DCA HB A1C WAIVED: HB A1C (BAYER DCA - WAIVED): 8.6 % — ABNORMAL HIGH (ref <7.0)

## 2017-04-28 LAB — MICROSCOPIC EXAMINATION
Epithelial Cells (non renal): NONE SEEN /hpf (ref 0–10)
RENAL EPITHEL UA: NONE SEEN /HPF

## 2017-04-28 MED ORDER — METFORMIN HCL 1000 MG PO TABS: 1000.0000 mg | ORAL_TABLET | Freq: Two times a day (BID) | ORAL | 3 refills | Status: DC

## 2017-04-28 MED ORDER — GLIMEPIRIDE 2 MG PO TABS: 2.0000 mg | ORAL_TABLET | Freq: Every day | ORAL | 3 refills | Status: DC

## 2017-04-28 MED ORDER — QUINAPRIL HCL 20 MG PO TABS: 20.0000 mg | ORAL_TABLET | Freq: Every day | ORAL | 3 refills | Status: DC

## 2017-04-28 MED ORDER — LEVOTHYROXINE SODIUM 75 MCG PO TABS: 75.0000 ug | ORAL_TABLET | Freq: Every day | ORAL | 3 refills | Status: DC

## 2017-04-28 MED ORDER — FLUTICASONE PROPIONATE 50 MCG/ACT NA SUSP: NASAL | 3 refills | Status: DC

## 2017-04-28 NOTE — Patient Instructions (Addendum)
Medicare Annual Wellness Visit  [facility] and the medical providers at [facility] strive to bring you the best medical care.  In doing so we not only want to address your current medical conditions and concerns but also to detect new conditions early and prevent illness, disease and health-related problems.    Medicare offers a yearly Wellness Visit which allows our clinical staff to assess your need for preventative services including immunizations, lifestyle education, counseling to decrease risk of preventable diseases and screening for fall risk and other medical concerns.    This visit is provided free of charge (no copay) for all Medicare recipients. The clinical pharmacists at [facility] have begun to conduct these Wellness Visits which will also include a thorough review of all your medications.    As you primary medical provider recommend that you make an appointment for your Annual Wellness Visit if you have not done so already this year.  You may set up this appointment before you leave today or you may call back [phone]) and schedule an appointment.  Please make sure when you call that you mention that you are scheduling your Annual Wellness Visit with the clinical pharmacist so that the appointment may be made for the proper length of time.     Continue current medications. Continue good therapeutic lifestyle changes which include good diet and exercise. Fall precautions discussed with patient. If an FOBT was given today- please return it to our front desk. If you are over 70 years old - you may need Prevnar 43 or the adult Pneumonia vaccine.  **Flu shots are available--- please call and schedule a FLU-CLINIC appointment**  After your visit with [place] today you will receive a survey in the mail or online from [employer] regarding your care with [place]. Please take a moment to fill this out. Your feedback is very  important to [place] as you can help [place] better understand your patient needs as well as improve your experience and satisfaction. WE CARE ABOUT YOU!!!   Continue to monitor blood sugars and blood pressures at home Watch sodium intake Stay active physically We will check with cardiology about the time for your next visit and schedule this.

## 2017-04-28 NOTE — Addendum Note (Signed)
Addended by: Zannie Cove on: 04/28/2017 12:45 PM   Modules accepted: Orders

## 2017-04-28 NOTE — Progress Notes (Signed)
Subjective:    Patient ID: [name] [name], male    DOB: 1940-07-31, 77 y.o.   MRN: [id number]  HPI Pt here for follow up and management of chronic medical problems which includes diabetes, hyperlipidemia and hypertension. He is taking medication regularly.  This patient is doing well overall.  He does complain of a cut on his hand and needs a tetanus shot.  Is requesting refills on several of his medicines.  The blood pressure was elevated today on 2 different occasions.  He will get lab work today and a rectal exam today.  The patient brings in blood sugars for review and they will B skin record with none being over 150 from home and this is when he is checking it throughout the day.  The patient has no particular complaints.  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  He denies any chest pain pressure tightness or shortness of breath.  He denies any trouble with swallowing heartburn indigestion nausea vomiting diarrhea blood in the stool or change in bowel habits.  His last colonoscopy was in 2012 and at that time he was told he needed another colonoscopy in 10 years.  We will give him an FOBT card to return in April.  He is passing his water without problems.    Patient Active Problem List   Diagnosis Date Noted  . Peripheral vascular insufficiency ([place]) 03/18/2016  . Family history of heart disease 03/18/2016  . Right inguinal hernia 12/13/2013  . Essential hypertension 12/13/2013  . BPH (benign prostatic hyperplasia) 12/13/2013  . Hypothyroidism 08/07/2013  . Vitamin D deficiency 04/02/2013  . Goiter, nontoxic, multinodular   . Kidney stone   . Hyperlipidemia 01/15/2009  . ABNORMAL ELECTROCARDIOGRAM 01/15/2009  . Type 2 diabetes mellitus treated with insulin ([place]) 01/09/2009  . HIATAL HERNIA 01/09/2009  . BASAL CELL CARCINOMA, HX OF 01/09/2009  .  DIVERTICULITIS, HX OF 01/09/2009   Outpatient Encounter Medications as of 04/28/2017  Medication Sig  . aspirin 81 MG tablet Take 81 mg by mouth daily.    Marland Kitchen BESIVANCE 0.6 % SUSP   . Camphor-Eucalyptus-Menthol (MEDICATED CHEST RUB) 4.73-1.2-2.6 % OINT   . Cholecalciferol (VITAMIN D3) 5000 UNITS CAPS Take 5,000 Units by mouth. Take 1 capsule three times per week  . CVS CINNAMON PO Take 2,000 mg by mouth daily.    . fluticasone (FLONASE) 50 MCG/ACT nasal spray One to 2 sprays each nostril at bedtime  . gabapentin (NEURONTIN) 100 MG capsule Take 1 capsule (100 mg total) by mouth daily. As directed  . Garlic 0355 MG CAPS Take by mouth daily.    Marland Kitchen gatifloxacin (ZYMAXID) 0.5 % SOLN   . glimepiride (AMARYL) 2 MG tablet Take 1 tablet (2 mg total) by mouth daily before breakfast.  . glucose blood test strip Check BS TID and PRN.DX.E11.9  . HYDROcodone-acetaminophen (NORCO/VICODIN) 5-325 MG tablet Take 1 tablet by mouth every 6 (six) hours as needed for moderate pain.  . Insulin Detemir (LEVEMIR FLEXTOUCH) 100 UNIT/ML Pen Inject up to 50 units once daily  . levothyroxine (SYNTHROID, LEVOTHROID) 75 MCG tablet Take 1 tablet (75 mcg total) by mouth daily.  . metFORMIN (GLUCOPHAGE) 1000 MG tablet Take 1 tablet (1,000 mg total) by mouth 2 (two) times daily with a meal.  . quinapril (ACCUPRIL) 20 MG tablet Take 1 tablet (20  mg total) by mouth at bedtime.  . ranitidine (ZANTAC) 150 MG tablet Take 1 tablet (150 mg total) by mouth as needed.  Marland Kitchen UNABLE TO FIND Apply 1 application topically daily. Diabetic skin relief foot cream   No facility-administered encounter medications on file as of 04/28/2017.       Review of Systems  Constitutional: Negative.   HENT: Negative.   Eyes: Negative.   Respiratory: Negative.   Cardiovascular: Negative.   Gastrointestinal: Negative.   Endocrine: Negative.   Genitourinary: Negative.   Musculoskeletal: Negative.   Skin: Negative.        Several lacerations and wounds  on hands --- tetanus due (will give today)  Allergic/Immunologic: Negative.   Neurological: Negative.   Hematological: Negative.   Psychiatric/Behavioral: Negative.        Objective:   Physical Exam  Constitutional: He is oriented to person, place, and time. He appears well-developed and well-nourished.  The patient is pleasant and alert and is wondering when he should go back and see the cardiologist because of the stress test he recently had.  HENT:  Head: Normocephalic and atraumatic.  Right Ear: External ear normal.  Left Ear: External ear normal.  Nose: Nose normal.  Mouth/Throat: Oropharynx is clear and moist. No oropharyngeal exudate.  Eyes: Conjunctivae and EOM are normal. Pupils are equal, round, and reactive to light. Right eye exhibits no discharge. Left eye exhibits no discharge. No scleral icterus.  Neck: Normal range of motion. Neck supple. No thyromegaly present.  No bruits thyromegaly or anterior cervical adenopathy  Cardiovascular: Normal rate, regular rhythm, normal heart sounds and intact distal pulses. Exam reveals no friction rub.  No murmur heard. Heart is regular at 72/min  Pulmonary/Chest: Effort normal and breath sounds normal. No respiratory distress. He has no wheezes. He has no rales. He exhibits no tenderness.  Lungs are clear anteriorly and posteriorly and no axillary adenopathy no chest wall masses.  Abdominal: Soft. Bowel sounds are normal. He exhibits no mass. There is no tenderness. There is no rebound and no guarding.  No liver or spleen enlargement, no epigastric tenderness no masses and no bruits and no inguinal adenopathy  Genitourinary: Rectum normal and penis normal.  Genitourinary Comments: The prostate is enlarged but soft and smooth without lumps or masses.  There were no rectal masses.  They were within normal limits with no inguinal hernias being palpated.  Musculoskeletal: Normal range of motion. He exhibits no edema.  Lymphadenopathy:     He has no cervical adenopathy.  Neurological: He is alert and oriented to person, place, and time. He has normal reflexes. No cranial nerve deficit.  Skin: Skin is warm and dry. No rash noted.  Psychiatric: He has a normal mood and affect. His behavior is normal. Judgment and thought content normal.  Nursing note and vitals reviewed.  BP (!) 147/89 (BP Location: Left Arm)   Pulse 64   Temp (!) 96.7 F (35.9 C) (Oral)   Ht '5\' 9"'  (1.753 m)   Wt 167 lb (75.8 kg)   BMI 24.66 kg/m   EKG with results pending==      Assessment & Plan:  1. Type 2 diabetes mellitus treated with insulin ([place]) -Home blood sugars overall look good, but in this patient a lot of times A1c's do not match up with a home readings. - CBC with Differential/Platelet - Bayer DCA Hb A1c Waived  2. Hyperlipidemia -Continue current treatment pending results of lab work - CBC with Differential/Platelet - Lipid  panel - EKG 12-Lead  3. Essential hypertension -The blood pressure was elevated on 2 occasions but we will not make any changes in treatment today.  We will remind the patient to check blood pressures regularly at home and bring readings by for review in a couple of weeks - BMP8+EGFR - CBC with Differential/Platelet - Hepatic function panel - EKG 12-Lead  4. Family history of heart disease -Follow-up with cardiology - CBC with Differential/Platelet - EKG 12-Lead  5. Vitamin D deficiency -Continue current treatment pending results of lab work - CBC with Differential/Platelet - VITAMIN D 25 Hydroxy (Vit-D Deficiency, Fractures)  6. History of BPH -The patient continues to have BPH but no symptoms associated with this. - CBC with Differential/Platelet - PSA, total and free - Urinalysis, Complete  7. Scratch of hand, left, initial encounter -Small puncture wound he will continue to apply pressure dressing was know if there is any sign of any infection. - Td : Tetanus/diphtheria >7yo Preservative   free  8. Seasonal allergic rhinitis due to pollen - fluticasone (FLONASE) 50 MCG/ACT nasal spray; One to 2 sprays each nostril at bedtime  Dispense: 42 g; Refill: 3  Meds ordered this encounter  Medications  . fluticasone (FLONASE) 50 MCG/ACT nasal spray    Sig: One to 2 sprays each nostril at bedtime    Dispense:  42 g    Refill:  3  . glimepiride (AMARYL) 2 MG tablet    Sig: Take 1 tablet (2 mg total) by mouth daily before breakfast.    Dispense:  90 tablet    Refill:  3  . levothyroxine (SYNTHROID, LEVOTHROID) 75 MCG tablet    Sig: Take 1 tablet (75 mcg total) by mouth daily.    Dispense:  90 tablet    Refill:  3  . metFORMIN (GLUCOPHAGE) 1000 MG tablet    Sig: Take 1 tablet (1,000 mg total) by mouth 2 (two) times daily with a meal.    Dispense:  180 tablet    Refill:  3  . quinapril (ACCUPRIL) 20 MG tablet    Sig: Take 1 tablet (20 mg total) by mouth at bedtime.    Dispense:  90 tablet    Refill:  3   Patient Instructions                       Medicare Annual Wellness Visit  [facility] and the medical providers at [facility] strive to bring you the best medical care.  In doing so we not only want to address your current medical conditions and concerns but also to detect new conditions early and prevent illness, disease and health-related problems.    Medicare offers a yearly Wellness Visit which allows our clinical staff to assess your need for preventative services including immunizations, lifestyle education, counseling to decrease risk of preventable diseases and screening for fall risk and other medical concerns.    This visit is provided free of charge (no copay) for all Medicare recipients. The clinical pharmacists at [facility] have begun to conduct these Wellness Visits which will also include a thorough review of all your medications.    As you primary medical provider recommend that you make an appointment for your  Annual Wellness Visit if you have not done so already this year.  You may set up this appointment before you leave today or you may call back ([phone]) and schedule an appointment.  Please make sure when  you call that you mention that you are scheduling your Annual Wellness Visit with the clinical pharmacist so that the appointment may be made for the proper length of time.     Continue current medications. Continue good therapeutic lifestyle changes which include good diet and exercise. Fall precautions discussed with patient. If an FOBT was given today- please return it to our front desk. If you are over 21 years old - you may need Prevnar 43 or the adult Pneumonia vaccine.  **Flu shots are available--- please call and schedule a FLU-CLINIC appointment**  After your visit with [place] today you will receive a survey in the mail or online from [employer] regarding your care with [place]. Please take a moment to fill this out. Your feedback is very important to [place] as you can help [place] better understand your patient needs as well as improve your experience and satisfaction. WE CARE ABOUT YOU!!!   Continue to monitor blood sugars and blood pressures at home Watch sodium intake Stay active physically We will check with cardiology about the time for your next visit and schedule this.  Arrie Senate MD

## 2017-04-29 LAB — LIPID PANEL
Chol/HDL Ratio: 2.6 ratio (ref 0.0–5.0)
Cholesterol, Total: 128 mg/dL (ref 100–199)
HDL: 49 mg/dL (ref >39)
LDL Calculated: 68 mg/dL (ref 0–99)
TRIGLYCERIDES: 53 mg/dL (ref 0–149)
VLDL CHOLESTEROL CAL: 11 mg/dL (ref 5–40)

## 2017-04-29 LAB — CBC WITH DIFFERENTIAL/PLATELET
BASOS: 0 %
Basophils Absolute: 0 10*3/uL (ref 0.0–0.2)
EOS (ABSOLUTE): 0.1 10*3/uL (ref 0.0–0.4)
EOS: 2 %
Hematocrit: 42.8 % (ref 37.5–51.0)
Hemoglobin: 14.5 g/dL (ref 13.0–17.7)
IMMATURE GRANS (ABS): 0 10*3/uL (ref 0.0–0.1)
IMMATURE GRANULOCYTES: 0 %
LYMPHS: 26 %
Lymphocytes Absolute: 1.8 10*3/uL (ref 0.7–3.1)
MCH: 28 pg (ref 26.6–33.0)
MCHC: 33.9 g/dL (ref 31.5–35.7)
MCV: 83 fL (ref 79–97)
MONOS ABS: 0.7 10*3/uL (ref 0.1–0.9)
Monocytes: 10 %
NEUTROS PCT: 62 %
Neutrophils Absolute: 4.3 10*3/uL (ref 1.4–7.0)
PLATELETS: 255 10*3/uL (ref 150–379)
RBC: 5.17 x10E6/uL (ref 4.14–5.80)
RDW: 14.4 % (ref 12.3–15.4)
WBC: 7 10*3/uL (ref 3.4–10.8)

## 2017-04-29 LAB — BMP8+EGFR
BUN/Creatinine Ratio: 12 (ref 10–24)
BUN: 9 mg/dL (ref 8–27)
CALCIUM: 9.6 mg/dL (ref 8.6–10.2)
CO2: 25 mmol/L (ref 20–29)
CREATININE: 0.78 mg/dL (ref 0.76–1.27)
Chloride: 105 mmol/L (ref 96–106)
GFR calc Af Amer: 101 mL/min/{1.73_m2} (ref >59)
GFR, EST NON AFRICAN AMERICAN: 88 mL/min/{1.73_m2} (ref >59)
GLUCOSE: 98 mg/dL (ref 65–99)
Potassium: 4.5 mmol/L (ref 3.5–5.2)
SODIUM: 143 mmol/L (ref 134–144)

## 2017-04-29 LAB — HEPATIC FUNCTION PANEL
ALK PHOS: 61 IU/L (ref 39–117)
ALT: 20 IU/L (ref 0–44)
AST: 18 IU/L (ref 0–40)
Albumin: 4.3 g/dL (ref 3.5–4.8)
BILIRUBIN TOTAL: 0.3 mg/dL (ref 0.0–1.2)
BILIRUBIN, DIRECT: 0.12 mg/dL (ref 0.00–0.40)
TOTAL PROTEIN: 6.6 g/dL (ref 6.0–8.5)

## 2017-04-29 LAB — PSA, TOTAL AND FREE
PSA, Free Pct: 19.4 %
PSA, Free: 0.6 ng/mL
Prostate Specific Ag, Serum: 3.1 ng/mL (ref 0.0–4.0)

## 2017-04-29 LAB — VITAMIN D 25 HYDROXY (VIT D DEFICIENCY, FRACTURES): VIT D 25 HYDROXY: 50.1 ng/mL (ref 30.0–100.0)

## 2017-04-30 LAB — URINE CULTURE

## 2017-05-03 ENCOUNTER — Other Ambulatory Visit: Payer: Self-pay | Admitting: Family Medicine

## 2017-05-03 MED ORDER — LEVOFLOXACIN 500 MG PO TABS: 500.0000 mg | ORAL_TABLET | Freq: Every day | ORAL | 0 refills | Status: DC

## 2017-05-03 NOTE — Telephone Encounter (Signed)
Med sent to pharm / pt aware

## 2017-05-13 DIAGNOSIS — E663 Overweight: Secondary | ICD-10-CM | POA: Diagnosis not present

## 2017-05-13 DIAGNOSIS — Z9842 Cataract extraction status, left eye: Secondary | ICD-10-CM | POA: Diagnosis not present

## 2017-05-13 DIAGNOSIS — Z794 Long term (current) use of insulin: Secondary | ICD-10-CM | POA: Diagnosis not present

## 2017-05-13 DIAGNOSIS — E119 Type 2 diabetes mellitus without complications: Secondary | ICD-10-CM | POA: Diagnosis not present

## 2017-05-13 DIAGNOSIS — I10 Essential (primary) hypertension: Secondary | ICD-10-CM | POA: Diagnosis not present

## 2017-05-13 DIAGNOSIS — E039 Hypothyroidism, unspecified: Secondary | ICD-10-CM | POA: Diagnosis not present

## 2017-05-13 DIAGNOSIS — Z9841 Cataract extraction status, right eye: Secondary | ICD-10-CM | POA: Diagnosis not present

## 2017-05-13 DIAGNOSIS — Z6825 Body mass index (BMI) 25.0-25.9, adult: Secondary | ICD-10-CM | POA: Diagnosis not present

## 2017-05-16 ENCOUNTER — Other Ambulatory Visit: Payer: Medicare PPO

## 2017-05-16 DIAGNOSIS — N390 Urinary tract infection, site not specified: Secondary | ICD-10-CM | POA: Diagnosis not present

## 2017-05-17 LAB — MICROSCOPIC EXAMINATION
Bacteria, UA: NONE SEEN
Epithelial Cells (non renal): NONE SEEN /hpf (ref 0–10)
RBC MICROSCOPIC, UA: NONE SEEN /HPF (ref 0–?)
Renal Epithel, UA: NONE SEEN /hpf
WBC UA: NONE SEEN /HPF (ref 0–?)

## 2017-05-17 LAB — URINALYSIS, ROUTINE W REFLEX MICROSCOPIC
BILIRUBIN UA: NEGATIVE
Ketones, UA: NEGATIVE
Leukocytes, UA: NEGATIVE
Nitrite, UA: NEGATIVE
PH UA: 6 (ref 5.0–7.5)
PROTEIN UA: NEGATIVE
RBC, UA: NEGATIVE
Specific Gravity, UA: 1.02 (ref 1.005–1.030)
UUROB: 0.2 mg/dL (ref 0.2–1.0)

## 2017-05-17 LAB — URINE CULTURE: ORGANISM ID, BACTERIA: NO GROWTH

## 2017-05-22 ENCOUNTER — Encounter: Payer: Self-pay | Admitting: Family Medicine

## 2017-05-22 DIAGNOSIS — E11319 Type 2 diabetes mellitus with unspecified diabetic retinopathy without macular edema: Secondary | ICD-10-CM | POA: Insufficient documentation

## 2017-06-19 NOTE — Progress Notes (Signed)
Cardiology Office Note   Date:  06/22/2017   ID:  ESSAM LOWDERMILK, DOB 05/20/1940, MRN [id number]  PCP:  Chipper Herb, MD  Cardiologist:   No primary care provider on file.   Chief Complaint  Patient presents with  . Hypertension      History of Present Illness: [name] [name] is a 77 y.o. male who presents for follow up of multiple cardiovascular risk factors.   .  I saw him in Feb 2016 for evaluation of this same thing.  He has diabetes that is not well controlled.  He did have a POET (Plain Old Exercise Treadmill) in 2012 that was negative for ischemia but there was some brief atrial fib post test.  He does not exercise routinely although he will occasionally get on the treadmill in his garage.  He will golf when the weather permits.  The patient denies any new symptoms such as chest discomfort, neck or arm discomfort. There has been no new shortness of breath, PND or orthopnea. There have been no reported palpitations, presyncope or syncope.   Past Medical History:  Diagnosis Date  . Basal cell carcinoma    left forehead, nose, neck  . Cataract   . Diabetes mellitus   . Diverticulitis   . Erectile dysfunction   . Gastroesophageal reflux disease with hiatal hernia   . Goiter, nontoxic, multinodular    with macrocyst  . Hiatal hernia    With reflex  . Hyperlipidemia   . Inguinal hernia    right side  . Kidney stone 1980  . Leg fracture    Left  . Liver hemangioma   . Nephrolithiasis   . Vitamin D deficiency     Past Surgical History:  Procedure Laterality Date  . BACK SURGERY  1984  . CATARACT EXTRACTION Bilateral   . EYE SURGERY    . LACERATION REPAIR  2008   Dr. Lenon Curt -to hand  . TIBIA FRACTURE SURGERY     left leg     Current Outpatient Medications  Medication Sig Dispense Refill  . aspirin 81 MG tablet Take 81 mg by mouth daily.      . Cholecalciferol (VITAMIN D3) 5000 UNITS CAPS Take 5,000 Units by mouth. Take 1 capsule three times per week      . CVS CINNAMON PO Take 2,000 mg by mouth daily.      . fluticasone (FLONASE) 50 MCG/ACT nasal spray One to 2 sprays each nostril at bedtime 42 g 3  . Garlic 2725 MG CAPS Take by mouth daily.      Marland Kitchen glimepiride (AMARYL) 2 MG tablet Take 1 tablet (2 mg total) by mouth daily before breakfast. 90 tablet 3  . Insulin Detemir (LEVEMIR FLEXTOUCH) 100 UNIT/ML Pen Inject up to 50 units once daily 45 mL 3  . levothyroxine (SYNTHROID, LEVOTHROID) 75 MCG tablet Take 1 tablet (75 mcg total) by mouth daily. 90 tablet 3  . metFORMIN (GLUCOPHAGE) 1000 MG tablet Take 1 tablet (1,000 mg total) by mouth 2 (two) times daily with a meal. 180 tablet 3  . quinapril (ACCUPRIL) 20 MG tablet Take 1 tablet (20 mg total) by mouth at bedtime. 90 tablet 3  . ranitidine (ZANTAC) 150 MG tablet Take 1 tablet (150 mg total) by mouth as needed. 90 tablet 1  . UNABLE TO FIND Apply 1 application topically daily. Diabetic skin relief foot cream    . BESIVANCE 0.6 % SUSP     . Camphor-Eucalyptus-Menthol (MEDICATED  CHEST RUB) 4.73-1.2-2.6 % OINT     . gabapentin (NEURONTIN) 100 MG capsule Take 1 capsule (100 mg total) by mouth daily. As directed 15 capsule 0  . gatifloxacin (ZYMAXID) 0.5 % SOLN     . glucose blood test strip Check BS TID and PRN.DX.E11.9 100 each 11  . HYDROcodone-acetaminophen (NORCO/VICODIN) 5-325 MG tablet Take 1 tablet by mouth every 6 (six) hours as needed for moderate pain. 40 tablet 0  . levofloxacin (LEVAQUIN) 500 MG tablet Take 1 tablet (500 mg total) by mouth daily. 7 tablet 0   No current facility-administered medications for this visit.     Allergies:   Actos [pioglitazone hydrochloride]; Lisinopril; Invokana [canagliflozin]; Niaspan [niacin er]; and Ramipril    Social History:  The patient  reports that he quit smoking about 34 years ago. His smoking use included cigarettes. He has a 27.00 pack-year smoking history. he has never used smokeless tobacco. He reports that he does not drink alcohol or use  drugs.   Family History:  The patient's family history includes Cancer in his father; Diabetes in his other; Early death (age of onset: 41) in his brother; Heart attack in his sister; Heart disease in his brother; Heart disease (age of onset: 66) in his mother.    ROS:  Please see the history of present illness.   Otherwise, review of systems are positive for none.   All other systems are reviewed and negative.    PHYSICAL EXAM: VS:  BP (!) 162/91   Pulse 67   Ht 5\' 10"  (1.778 m)   Wt 169 lb (76.7 kg)   BMI 24.25 kg/m  , BMI Body mass index is 24.25 kg/m. GENERAL:  Well appearing HEENT:  Pupils equal round and reactive, fundi not visualized, oral mucosa unremarkable NECK:  No jugular venous distention, waveform within normal limits, carotid upstroke brisk and symmetric, no bruits, no thyromegaly LYMPHATICS:  No cervical, inguinal adenopathy LUNGS:  Clear to auscultation bilaterally BACK:  No CVA tenderness CHEST:  Unremarkable HEART:  PMI not displaced or sustained,S1 and S2 within normal limits, no S3, no S4, no clicks, no rubs, no murmurs ABD:  Flat, positive bowel sounds normal in frequency in pitch, no bruits, no rebound, no guarding, no midline pulsatile mass, no hepatomegaly, no splenomegaly EXT:  2 plus pulses throughout, no edema, no cyanosis no clubbing SKIN:  No rashes no nodules NEURO:  Cranial nerves II through XII grossly intact, motor grossly intact throughout PSYCH:  Cognitively intact, oriented to person place and time    EKG:  EKG is not ordered today. The ekg ordered 04/28/17 demonstrates normal sinus rhythm, rate 65, axis within normal limits, intervals within normal limits, no acute ST-T wave changes.   Recent Labs: 04/28/2017: ALT 20; BUN 9; Creatinine, Ser 0.78; Hemoglobin 14.5; Platelets 255; Potassium 4.5; Sodium 143    Lipid Panel    Component Value Date/Time   CHOL 128 04/28/2017 1241   CHOL 128 10/16/2012 1545   TRIG 53 04/28/2017 1241   TRIG 52  03/18/2016 1126   TRIG 102 10/16/2012 1545   HDL 49 04/28/2017 1241   HDL 53 03/18/2016 1126   HDL 45 10/16/2012 1545   CHOLHDL 2.6 04/28/2017 1241   LDLCALC 68 04/28/2017 1241   LDLCALC 66 12/13/2013 0842   LDLCALC 63 10/16/2012 1545      Wt Readings from Last 3 Encounters:  06/22/17 169 lb (76.7 kg)  04/28/17 167 lb (75.8 kg)  12/14/16 174 lb (78.9 kg)  Other studies Reviewed: Additional studies/ records that were reviewed today include: POET (Plain Old Exercise Treadmill) 2012. Review of the above records demonstrates:  Please see elsewhere in the note.     ASSESSMENT AND PLAN:  HTN: His blood pressure is elevated.  He can keep a blood pressure diary.  Adjustments to his meds will be based on this.  DM: Hemoglobin A1c was 8.6 which is down from 10.1.  We discussed the need for good diabetes control.  He just had his meds changed and I will defer to Dr. Laurance Flatten.  RISK REDUCTION: He has significant cardiovascular risk factors.  I would like him to exercise more. I will bring the patient back for a POET (Plain Old Exercise Test). This will allow me to screen for obstructive coronary disease, risk stratify and very importantly provide a prescription for exercise.  Current medicines are reviewed at length with the patient today.  The patient does not have concerns regarding medicines.  The following changes have been made:  no change  Labs/ tests ordered today include:   Orders Placed This Encounter  Procedures  . Exercise Tolerance Test     Disposition:   FU with me as needed after the POET (Plain Old Exercise Treadmill)    Signed, Minus Breeding, MD  06/22/2017 12:02 PM    [facility]

## 2017-06-22 ENCOUNTER — Ambulatory Visit: Payer: Medicare PPO | Admitting: Cardiology

## 2017-06-22 ENCOUNTER — Encounter: Payer: Self-pay | Admitting: Cardiology

## 2017-06-22 VITALS — BP 162/91 | HR 67 | Ht 70.0 in | Wt 169.0 lb

## 2017-06-22 DIAGNOSIS — R9431 Abnormal electrocardiogram [ECG] [EKG]: Secondary | ICD-10-CM

## 2017-06-22 DIAGNOSIS — I10 Essential (primary) hypertension: Secondary | ICD-10-CM

## 2017-06-22 DIAGNOSIS — E782 Mixed hyperlipidemia: Secondary | ICD-10-CM | POA: Diagnosis not present

## 2017-06-22 NOTE — Patient Instructions (Signed)
Medication Instructions:  The current medical regimen is effective;  continue present plan and medications.  Testing/Procedures: Your physician has requested that you have an exercise tolerance test. For further information please visit [email or web address]. Please also follow instruction sheet, as given.  Follow-Up: Follow up as needed after the above testing.  If you need a refill on your cardiac medications before your next appointment, please call your pharmacy.  Thank you for choosing [facility]!!

## 2017-06-23 ENCOUNTER — Telehealth (HOSPITAL_COMMUNITY): Payer: Self-pay

## 2017-06-23 NOTE — Telephone Encounter (Signed)
Encounter complete. 

## 2017-06-28 ENCOUNTER — Ambulatory Visit (HOSPITAL_COMMUNITY)
Admission: RE | Admit: 2017-06-28 | Discharge: 2017-06-28 | Disposition: A | Discharge status: Home / Self Care | Payer: Medicare PPO | Source: Ambulatory Visit | Attending: Cardiovascular Disease | Admitting: Cardiovascular Disease

## 2017-06-28 ENCOUNTER — Encounter (HOSPITAL_COMMUNITY): Payer: Self-pay | Admitting: *Deleted

## 2017-06-28 DIAGNOSIS — R008 Other abnormalities of heart beat: Secondary | ICD-10-CM | POA: Insufficient documentation

## 2017-06-28 DIAGNOSIS — E782 Mixed hyperlipidemia: Secondary | ICD-10-CM | POA: Diagnosis not present

## 2017-06-28 DIAGNOSIS — I10 Essential (primary) hypertension: Secondary | ICD-10-CM | POA: Diagnosis not present

## 2017-06-28 DIAGNOSIS — R9431 Abnormal electrocardiogram [ECG] [EKG]: Secondary | ICD-10-CM | POA: Diagnosis not present

## 2017-06-28 LAB — EXERCISE TOLERANCE TEST
CHL CUP MPHR: 144 {beats}/min
CHL CUP RESTING HR STRESS: 78 {beats}/min
CSEPPHR: 166 {beats}/min
Estimated workload: 10.1 METS
Exercise duration (min): 9 min
Exercise duration (sec): 0 s
Percent HR: 115 %
RPE: 17

## 2017-06-28 NOTE — Progress Notes (Signed)
Pt had 3 beat run of vtach. Test shown to Dr. Oval Linsey and she said pt. May leave.

## 2017-06-29 ENCOUNTER — Other Ambulatory Visit: Payer: Self-pay | Admitting: *Deleted

## 2017-06-29 ENCOUNTER — Telehealth: Payer: Self-pay | Admitting: Cardiology

## 2017-06-29 DIAGNOSIS — E119 Type 2 diabetes mellitus without complications: Secondary | ICD-10-CM

## 2017-06-29 DIAGNOSIS — I10 Essential (primary) hypertension: Secondary | ICD-10-CM

## 2017-06-29 DIAGNOSIS — Z794 Long term (current) use of insulin: Secondary | ICD-10-CM

## 2017-06-29 DIAGNOSIS — R9439 Abnormal result of other cardiovascular function study: Secondary | ICD-10-CM

## 2017-06-29 NOTE — Telephone Encounter (Signed)
-----   Message from Blenda Bridegroom sent at 06/29/2017  7:00 AM EDT ----- Regarding: Abnormal ETT Abnormal ETT read by Dr Cathie Olden on June 28, 2017. This test was ordered by Dr Percival Spanish.

## 2017-06-29 NOTE — Progress Notes (Signed)
pt aware he will be called to be scheduled for Lexiscan.  Reviewed instructions verbally.

## 2017-06-29 NOTE — Telephone Encounter (Signed)
Per chart review, [name] [name] showed ETT info to DOD yesterday 3/12 and patient was OK to leave. Patient is not scheduled for follow up with MD at this time. Routed to P. Raul Del RN as Juluis Rainier

## 2017-06-29 NOTE — Telephone Encounter (Signed)
I will send the response via results.

## 2017-06-29 NOTE — Telephone Encounter (Signed)
Results of GXT is in Dr [employer] for review.  Will await review and orders.

## 2017-07-01 ENCOUNTER — Telehealth (HOSPITAL_COMMUNITY): Payer: Self-pay

## 2017-07-01 NOTE — Telephone Encounter (Signed)
Encounter complete. 

## 2017-07-05 ENCOUNTER — Ambulatory Visit (HOSPITAL_COMMUNITY)
Admission: RE | Admit: 2017-07-05 | Discharge: 2017-07-05 | Disposition: A | Discharge status: Home / Self Care | Payer: Medicare PPO | Source: Ambulatory Visit | Attending: Cardiovascular Disease | Admitting: Cardiovascular Disease

## 2017-07-05 DIAGNOSIS — I10 Essential (primary) hypertension: Secondary | ICD-10-CM

## 2017-07-05 DIAGNOSIS — R9439 Abnormal result of other cardiovascular function study: Secondary | ICD-10-CM | POA: Diagnosis not present

## 2017-07-05 DIAGNOSIS — Z794 Long term (current) use of insulin: Secondary | ICD-10-CM | POA: Diagnosis not present

## 2017-07-05 DIAGNOSIS — E119 Type 2 diabetes mellitus without complications: Secondary | ICD-10-CM | POA: Diagnosis not present

## 2017-07-05 LAB — MYOCARDIAL PERFUSION IMAGING
CHL CUP NUCLEAR SRS: 2
CHL CUP NUCLEAR SSS: 2
CSEPPHR: 111 {beats}/min
LV dias vol: 134 mL (ref 62–150)
LV sys vol: 75 mL
Rest HR: 65 {beats}/min
SDS: 0
TID: 1.14

## 2017-07-05 MED ORDER — REGADENOSON 0.4 MG/5ML IV SOLN
0.4000 mg | Freq: Once | INTRAVENOUS | Status: AC
  Administered 2017-07-05: 0.4 mg via INTRAVENOUS

## 2017-07-05 MED ORDER — TECHNETIUM TC 99M TETROFOSMIN IV KIT
32.0000 | PACK | Freq: Once | INTRAVENOUS | Status: AC | PRN
  Administered 2017-07-05: 32 via INTRAVENOUS
  Filled 2017-07-05: qty 32

## 2017-07-05 MED ORDER — TECHNETIUM TC 99M TETROFOSMIN IV KIT
10.1000 | PACK | Freq: Once | INTRAVENOUS | Status: AC | PRN
  Administered 2017-07-05: 10.1 via INTRAVENOUS
  Filled 2017-07-05: qty 11

## 2017-07-15 ENCOUNTER — Ambulatory Visit: Payer: Medicare PPO | Admitting: Family Medicine

## 2017-07-15 ENCOUNTER — Encounter: Payer: Self-pay | Admitting: Family Medicine

## 2017-07-15 VITALS — BP 149/89 | HR 121 | Temp 97.3°F | Ht 70.0 in | Wt 167.4 lb

## 2017-07-15 DIAGNOSIS — J0110 Acute frontal sinusitis, unspecified: Secondary | ICD-10-CM | POA: Diagnosis not present

## 2017-07-15 DIAGNOSIS — R6883 Chills (without fever): Secondary | ICD-10-CM | POA: Diagnosis not present

## 2017-07-15 LAB — VERITOR FLU A/B WAIVED
INFLUENZA A: NEGATIVE
INFLUENZA B: NEGATIVE

## 2017-07-15 MED ORDER — LEVOFLOXACIN 750 MG PO TABS: 750.0000 mg | ORAL_TABLET | Freq: Every day | ORAL | 0 refills | Status: DC

## 2017-07-15 NOTE — Progress Notes (Signed)
   HPI  Patient presents today illness.  Patient explains he has been ill for 5-6 days including cough, congestion, sore throat, chills, and frontal headache.  Patient believes he may have a sinus infection and is concerned he is developing pneumonia.  He is tolerating fluids well states he has a decreased appetite.    PMH: Smoking status noted ROS: Per HPI  Objective: BP (!) 149/89   Pulse (!) 121   Temp (!) 97.3 F (36.3 C) (Oral)   Ht 5\' 10"  (1.778 m)   Wt 167 lb 6.4 oz (75.9 kg)   SpO2 97%   BMI 24.02 kg/m  Gen: NAD, alert, cooperative with exam HEENT: NCAT.,  Oropharynx moist and clear, nares clear, positive tenderness to palpation of bilateral frontal sinuses CV: RRR, good S1/S2, no murmur Resp: CTABL, no wheezes, non-labored Ext: No edema, warm Neuro: Alert and oriented, No gross deficits  Assessment and plan:  #Acute frontal sinusitis Patient with elevated heart rate and also rales in the left lower lung field as well.  However his lung exam is otherwise normal Treating with levaquin given rales   Orders Placed This Encounter  Procedures  . Veritor Flu A/B Waived    Order Specific Question:   Source    Answer:   nasal     Laroy Apple, MD [facility] 07/15/2017, 9:57 AM

## 2017-07-15 NOTE — Patient Instructions (Signed)
Great to see you!   Sinusitis, Adult Sinusitis is soreness and inflammation of your sinuses. Sinuses are hollow spaces in the bones around your face. They are located:  Around your eyes.  In the middle of your forehead.  Behind your nose.  In your cheekbones.  Your sinuses and nasal passages are lined with a stringy fluid (mucus). Mucus normally drains out of your sinuses. When your nasal tissues get inflamed or swollen, the mucus can get trapped or blocked so air cannot flow through your sinuses. This lets bacteria, viruses, and funguses grow, and that leads to infection. Follow these instructions at home: Medicines  Take, use, or apply over-the-counter and prescription medicines only as told by your doctor. These may include nasal sprays.  If you were prescribed an antibiotic medicine, take it as told by your doctor. Do not stop taking the antibiotic even if you start to feel better. Hydrate and Humidify  Drink enough water to keep your pee (urine) clear or pale yellow.  Use a cool mist humidifier to keep the humidity level in your home above 50%.  Breathe in steam for 10-15 minutes, 3-4 times a day or as told by your doctor. You can do this in the bathroom while a hot shower is running.  Try not to spend time in cool or dry air. Rest  Rest as much as possible.  Sleep with your head raised (elevated).  Make sure to get enough sleep each night. General instructions  Put a warm, moist washcloth on your face 3-4 times a day or as told by your doctor. This will help with discomfort.  Wash your hands often with soap and water. If there is no soap and water, use hand sanitizer.  Do not smoke. Avoid being around people who are smoking (secondhand smoke).  Keep all follow-up visits as told by your doctor. This is important. Contact a doctor if:  You have a fever.  Your symptoms get worse.  Your symptoms do not get better within 10 days. Get help right away if:  You  have a very bad headache.  You cannot stop throwing up (vomiting).  You have pain or swelling around your face or eyes.  You have trouble seeing.  You feel confused.  Your neck is stiff.  You have trouble breathing. This information is not intended to replace advice given to you by your health care provider. Make sure you discuss any questions you have with your health care provider. Document Released: 09/22/2007 Document Revised: 11/30/2015 Document Reviewed: 01/29/2015 Elsevier Interactive Patient Education  2018 [employer]  

## 2017-07-28 ENCOUNTER — Telehealth: Payer: Self-pay | Admitting: *Deleted

## 2017-07-28 NOTE — Telephone Encounter (Signed)
Received BP diary results from patient.  Dr Percival Spanish reviewed and did not want to [facility] changes to his medications at this time.   Spoke with Dorian Pod (on DPR) who is aware and will let pt know once he arrives back home.  He will c/b if any questions or concerns.

## 2017-08-08 ENCOUNTER — Other Ambulatory Visit: Payer: Medicare PPO

## 2017-08-08 DIAGNOSIS — Z1211 Encounter for screening for malignant neoplasm of colon: Secondary | ICD-10-CM

## 2017-08-09 LAB — FECAL OCCULT BLOOD, IMMUNOCHEMICAL: Fecal Occult Bld: NEGATIVE

## 2017-09-14 ENCOUNTER — Encounter: Payer: Self-pay | Admitting: Family Medicine

## 2017-09-14 ENCOUNTER — Ambulatory Visit (INDEPENDENT_AMBULATORY_CARE_PROVIDER_SITE_OTHER): Payer: Medicare PPO

## 2017-09-14 ENCOUNTER — Ambulatory Visit: Payer: Medicare PPO | Admitting: Family Medicine

## 2017-09-14 VITALS — BP 136/79 | HR 67 | Temp 97.0°F | Ht 70.0 in | Wt 168.0 lb

## 2017-09-14 DIAGNOSIS — I739 Peripheral vascular disease, unspecified: Secondary | ICD-10-CM | POA: Diagnosis not present

## 2017-09-14 DIAGNOSIS — Z794 Long term (current) use of insulin: Secondary | ICD-10-CM

## 2017-09-14 DIAGNOSIS — I10 Essential (primary) hypertension: Secondary | ICD-10-CM

## 2017-09-14 DIAGNOSIS — E559 Vitamin D deficiency, unspecified: Secondary | ICD-10-CM

## 2017-09-14 DIAGNOSIS — Z8249 Family history of ischemic heart disease and other diseases of the circulatory system: Secondary | ICD-10-CM | POA: Diagnosis not present

## 2017-09-14 DIAGNOSIS — J301 Allergic rhinitis due to pollen: Secondary | ICD-10-CM | POA: Diagnosis not present

## 2017-09-14 DIAGNOSIS — E119 Type 2 diabetes mellitus without complications: Secondary | ICD-10-CM

## 2017-09-14 DIAGNOSIS — B351 Tinea unguium: Secondary | ICD-10-CM

## 2017-09-14 DIAGNOSIS — E782 Mixed hyperlipidemia: Secondary | ICD-10-CM

## 2017-09-14 DIAGNOSIS — E1136 Type 2 diabetes mellitus with diabetic cataract: Secondary | ICD-10-CM

## 2017-09-14 DIAGNOSIS — Z87438 Personal history of other diseases of male genital organs: Secondary | ICD-10-CM | POA: Diagnosis not present

## 2017-09-14 LAB — BAYER DCA HB A1C WAIVED: HB A1C: 8.5 % — AB (ref <7.0)

## 2017-09-14 NOTE — Progress Notes (Signed)
o

## 2017-09-14 NOTE — Progress Notes (Signed)
Subjective:    Patient ID: [name] [name], male    DOB: Apr 04, 1941, 77 y.o.   MRN: [id number]  HPI Pt here for follow up and management of chronic medical problems which includes hypertension and diabetes. He is taking medication regularly.  The patient is having some back pain and he was wondering if he could have shingles.  His initial blood pressure was elevated but the repeat was good at 136 over 79.  He is due to get a chest x-ray and lab work today.  He brings in blood sugars for review and the overall majority of these are great with home blood pressures running around 140 143/70-80 range.  These will be scanned into the record.  The patient has a history of basal cell carcinomas and he does see the dermatologist periodically.  The family history is positive for heart disease in his mother and lung cancer in his father.  He also has 2 siblings both have had heart disease and both have passed away.  Patient is doing well overall.  He recently completed a cardiac work-up with Dr. Percival Spanish because of his strong family history and was told that everything was stable and that he did not need to come back.  I told the patient I would like for him to see the cardiologist every couple years and he will try to remember to do this and 2021.  He denies any chest pain pressure tightness or shortness of breath.  He denies any trouble with swallowing heartburn indigestion nausea vomiting diarrhea or blood in the stool.  There is no change in bowel habits.  His last colonoscopy was in 2012 and there is no family history of colon cancer.  He is passing his water well and recently has some slight burning but that has resolved.  I did encourage him to drink more water and fluids.   Patient Active Problem List   Diagnosis Date Noted  . Diabetic retinopathy ([place]) 05/22/2017  . Peripheral vascular insufficiency ([place]) 03/18/2016  . Family history of heart disease 03/18/2016  . Right inguinal hernia 12/13/2013  .  Essential hypertension 12/13/2013  . BPH (benign prostatic hyperplasia) 12/13/2013  . Hypothyroidism 08/07/2013  . Vitamin D deficiency 04/02/2013  . Goiter, nontoxic, multinodular   . Kidney stone   . Hyperlipidemia 01/15/2009  . ABNORMAL ELECTROCARDIOGRAM 01/15/2009  . Type 2 diabetes mellitus treated with insulin ([place]) 01/09/2009  . HIATAL HERNIA 01/09/2009  . BASAL CELL CARCINOMA, HX OF 01/09/2009  . DIVERTICULITIS, HX OF 01/09/2009   Outpatient Encounter Medications as of 09/14/2017  Medication Sig  . aspirin 81 MG tablet Take 81 mg by mouth daily.    Marland Kitchen BESIVANCE 0.6 % SUSP   . Camphor-Eucalyptus-Menthol (MEDICATED CHEST RUB) 4.73-1.2-2.6 % OINT   . Cholecalciferol (VITAMIN D3) 5000 UNITS CAPS Take 5,000 Units by mouth. Take 1 capsule three times per week  . [employer]S CINNAMON PO Take 2,000 mg by mouth daily.    . fluticasone (FLONASE) 50 MCG/ACT nasal spray One to 2 sprays each nostril at bedtime  . Garlic 1771 MG CAPS Take by mouth daily.    Marland Kitchen glimepiride (AMARYL) 2 MG tablet Take 1 tablet (2 mg total) by mouth daily before breakfast.  . glucose blood test strip Check BS TID and [id number]  . Insulin Detemir (LEVEMIR FLEXTOUCH) 100 UNIT/ML Pen Inject up to 50 units once daily  . levofloxacin (LEVAQUIN) 750 MG tablet Take 1 tablet (750 mg total) by mouth daily.  Marland Kitchen levothyroxine (  SYNTHROID, LEVOTHROID) 75 MCG tablet Take 1 tablet (75 mcg total) by mouth daily.  . metFORMIN (GLUCOPHAGE) 1000 MG tablet Take 1 tablet (1,000 mg total) by mouth 2 (two) times daily with a meal.  . quinapril (ACCUPRIL) 20 MG tablet Take 1 tablet (20 mg total) by mouth at bedtime.  . ranitidine (ZANTAC) 150 MG tablet Take 1 tablet (150 mg total) by mouth as needed.  Marland Kitchen UNABLE TO FIND Apply 1 application topically daily. Diabetic skin relief foot cream  . [DISCONTINUED] gabapentin (NEURONTIN) 100 MG capsule Take 1 capsule (100 mg total) by mouth daily. As directed  . [DISCONTINUED] gatifloxacin (ZYMAXID) 0.5 %  SOLN   . [DISCONTINUED] HYDROcodone-acetaminophen (NORCO/VICODIN) 5-325 MG tablet Take 1 tablet by mouth every 6 (six) hours as needed for moderate pain.   No facility-administered encounter medications on file as of 09/14/2017.      Review of Systems  Constitutional: Negative.   HENT: Negative.   Eyes: Negative.   Respiratory: Negative.   Cardiovascular: Negative.   Gastrointestinal: Negative.   Endocrine: Negative.   Genitourinary: Negative.   Musculoskeletal: Negative.   Skin: Negative.   Allergic/Immunologic: Negative.   Neurological: Negative.        Shingles ? Back pain   Hematological: Negative.   Psychiatric/Behavioral: Negative.        Objective:   Physical Exam  Constitutional: He is oriented to person, place, and time. He appears well-developed and well-nourished.  The patient is pleasant and relaxed  HENT:  Head: Normocephalic and atraumatic.  Right Ear: External ear normal.  Left Ear: External ear normal.  Nose: Nose normal.  Mouth/Throat: Oropharynx is clear and moist. No oropharyngeal exudate.  Eyes: Pupils are equal, round, and reactive to light. Conjunctivae and EOM are normal. Right eye exhibits no discharge. Left eye exhibits no discharge. No scleral icterus.  He has an upcoming eye exam scheduled in July and he will remind the ophthalmologist to send [place] a copy of that report  Neck: Normal range of motion. Neck supple. No thyromegaly present.  No bruits thyromegaly or anterior cervical adenopathy  Cardiovascular: Normal rate, regular rhythm, normal heart sounds and intact distal pulses.  No murmur heard. Heart is regular at 72/min without murmurs  Pulmonary/Chest: Effort normal and breath sounds normal. He has no wheezes. He has no rales. He exhibits no tenderness.  Clear anteriorly and posteriorly and no axillary adenopathy  Abdominal: Soft. Bowel sounds are normal. He exhibits no mass. There is no tenderness. There is no guarding.  No abdominal  tenderness masses organ enlargement or bruits or inguinal adenopathy  Musculoskeletal: Normal range of motion. He exhibits no edema.  Lymphadenopathy:    He has no cervical adenopathy.  Neurological: He is alert and oriented to person, place, and time. He has normal reflexes. No cranial nerve deficit.  Skin: Skin is warm and dry. No rash noted. No erythema.  Dry skin on lower back with no vesicles or blisters.  Psychiatric: He has a normal mood and affect. His behavior is normal. Judgment and thought content normal.  The patient is pleasant and alert  Nursing note and vitals reviewed.  BP (!) 151/83 (BP Location: Left Arm)   Pulse 67   Temp (!) 97 F (36.1 C) (Oral)   Ht '5\' 10"'  (1.778 m)   Wt 168 lb (76.2 kg)   BMI 24.11 kg/m         Assessment & Plan:  1. Type 2 diabetes mellitus treated with insulin (HCC) -Continue  with current therapy based on home blood sugar readings brought in for review. - BMP8+EGFR - CBC with Differential/Platelet - Bayer DCA Hb A1c Waived  2. Hyperlipidemia -Continue with current treatment pending results of lab work along with aggressive therapeutic lifestyle changes - Lipid panel - CBC with Differential/Platelet - DG Chest 2 View; Future  3. Essential hypertension -Continue with healthy diet and sodium restriction and monitoring blood pressures at home when possible - BMP8+EGFR - CBC with Differential/Platelet - Hepatic function panel - DG Chest 2 View; Future  4. Family history of heart disease -Patient just completed a work-up by the cardiologist because of this family history of heart disease in the work-up according the patient was negative and the cardiologist that he did not need to see him back.  I did encourage the patient to see him again in 2 to 3 years just because of the strong family history.  We did discuss working on all the risk factors which she has and he seems to understand this. - Lipid panel - CBC with  Differential/Platelet - DG Chest 2 View; Future  5. Vitamin D deficiency -Continue current treatment pending results of lab work - CBC with Differential/Platelet - VITAMIN D 25 Hydroxy (Vit-D Deficiency, Fractures)  6. History of BPH -Patient has had some slight burning with passing his water but this has gotten better. - CBC with Differential/Platelet  7. Toenail fungus -He may try some Lamisil cream to see if this may help some but emphasized to him he would need to use this for a long period of time.  8. Seasonal allergic rhinitis due to pollen -PRN treatment  9. Peripheral vascular insufficiency (HCC) -Actually felt good pulses today.  10. 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 Patient Instructions                       Medicare Annual Wellness Visit  [facility] and the medical providers at [facility] strive to bring you the best medical care.  In doing so we not only want to address your current medical conditions and concerns but also to detect new conditions early and prevent illness, disease and health-related problems.    Medicare offers a yearly Wellness Visit which allows our clinical staff to assess your need for preventative services including immunizations, lifestyle education, counseling to decrease risk of preventable diseases and screening for fall risk and other medical concerns.    This visit is provided free of charge (no copay) for all Medicare recipients. The clinical pharmacists at [facility] have begun to conduct these Wellness Visits which will also include a thorough review of all your medications.    As you primary medical provider recommend that you make an appointment for your Annual Wellness Visit if you have not done so already this year.  You may set up this appointment before you leave today or you may call back ([phone]) and schedule an appointment.  Please make sure when  you call that you mention that you are scheduling your Annual Wellness Visit with the clinical pharmacist so that the appointment may be made for the proper length of time.     Continue current medications. Continue good therapeutic lifestyle changes which include good diet and exercise. Fall precautions discussed with patient. If an FOBT was given today- please return it to our front desk. If you are over 22 years old - you may need Prevnar 62  or the adult Pneumonia vaccine.  **Flu shots are available--- please call and schedule a FLU-CLINIC appointment**  After your visit with [place] today you will receive a survey in the mail or online from [employer] regarding your care with [place]. Please take a moment to fill this out. Your feedback is very important to [place] as you can help [place] better understand your patient needs as well as improve your experience and satisfaction. WE CARE ABOUT YOU!!!   Use cortisone 10 on the back sparingly 2-3 times weekly at nighttime Drink lots of water stay well-hydrated Continue current medicines Consider seeing the cardiologist again in a couple of years because of the strong family history of heart disease When your next colonoscopy is due, consider doing a Cologuard at that time if you do not want to repeat the colonoscopy Check blood sugars regularly  Arrie Senate MD

## 2017-09-14 NOTE — Patient Instructions (Addendum)
Medicare Annual Wellness Visit  [facility] and the medical providers at [facility] strive to bring you the best medical care.  In doing so we not only want to address your current medical conditions and concerns but also to detect new conditions early and prevent illness, disease and health-related problems.    Medicare offers a yearly Wellness Visit which allows our clinical staff to assess your need for preventative services including immunizations, lifestyle education, counseling to decrease risk of preventable diseases and screening for fall risk and other medical concerns.    This visit is provided free of charge (no copay) for all Medicare recipients. The clinical pharmacists at [facility] have begun to conduct these Wellness Visits which will also include a thorough review of all your medications.    As you primary medical provider recommend that you make an appointment for your Annual Wellness Visit if you have not done so already this year.  You may set up this appointment before you leave today or you may call back ([phone]) and schedule an appointment.  Please make sure when you call that you mention that you are scheduling your Annual Wellness Visit with the clinical pharmacist so that the appointment may be made for the proper length of time.     Continue current medications. Continue good therapeutic lifestyle changes which include good diet and exercise. Fall precautions discussed with patient. If an FOBT was given today- please return it to our front desk. If you are over 16 years old - you may need Prevnar 46 or the adult Pneumonia vaccine.  **Flu shots are available--- please call and schedule a FLU-CLINIC appointment**  After your visit with [place] today you will receive a survey in the mail or online from [employer] regarding your care with [place]. Please take a moment to fill this out. Your feedback is very  important to [place] as you can help [place] better understand your patient needs as well as improve your experience and satisfaction. WE CARE ABOUT YOU!!!   Use cortisone 10 on the back sparingly 2-3 times weekly at nighttime Drink lots of water stay well-hydrated Continue current medicines Consider seeing the cardiologist again in a couple of years because of the strong family history of heart disease When your next colonoscopy is due, consider doing a Cologuard at that time if you do not want to repeat the colonoscopy Check blood sugars regularly

## 2017-09-15 ENCOUNTER — Telehealth: Payer: Self-pay | Admitting: Family Medicine

## 2017-09-15 ENCOUNTER — Encounter: Payer: Self-pay | Admitting: Family Medicine

## 2017-09-15 LAB — HEPATIC FUNCTION PANEL
ALK PHOS: 67 IU/L (ref 39–117)
ALT: 14 IU/L (ref 0–44)
AST: 13 IU/L (ref 0–40)
Albumin: 4.2 g/dL (ref 3.5–4.8)
Bilirubin Total: 0.2 mg/dL (ref 0.0–1.2)
Bilirubin, Direct: 0.08 mg/dL (ref 0.00–0.40)
Total Protein: 6.3 g/dL (ref 6.0–8.5)

## 2017-09-15 LAB — CBC WITH DIFFERENTIAL/PLATELET
BASOS ABS: 0 10*3/uL (ref 0.0–0.2)
Basos: 0 %
EOS (ABSOLUTE): 0.2 10*3/uL (ref 0.0–0.4)
Eos: 2 %
HEMATOCRIT: 43.8 % (ref 37.5–51.0)
HEMOGLOBIN: 14.7 g/dL (ref 13.0–17.7)
Immature Grans (Abs): 0.1 10*3/uL (ref 0.0–0.1)
Immature Granulocytes: 1 %
LYMPHS ABS: 1.6 10*3/uL (ref 0.7–3.1)
Lymphs: 15 %
MCH: 28.2 pg (ref 26.6–33.0)
MCHC: 33.6 g/dL (ref 31.5–35.7)
MCV: 84 fL (ref 79–97)
MONOCYTES: 7 %
Monocytes Absolute: 0.8 10*3/uL (ref 0.1–0.9)
NEUTROS ABS: 7.6 10*3/uL — AB (ref 1.4–7.0)
Neutrophils: 75 %
Platelets: 279 10*3/uL (ref 150–450)
RBC: 5.22 x10E6/uL (ref 4.14–5.80)
RDW: 14.7 % (ref 12.3–15.4)
WBC: 10.2 10*3/uL (ref 3.4–10.8)

## 2017-09-15 LAB — VITAMIN D 25 HYDROXY (VIT D DEFICIENCY, FRACTURES): VIT D 25 HYDROXY: 49.5 ng/mL (ref 30.0–100.0)

## 2017-09-15 LAB — BMP8+EGFR
BUN/Creatinine Ratio: 16 (ref 10–24)
BUN: 14 mg/dL (ref 8–27)
CALCIUM: 9.8 mg/dL (ref 8.6–10.2)
CHLORIDE: 103 mmol/L (ref 96–106)
CO2: 24 mmol/L (ref 20–29)
CREATININE: 0.88 mg/dL (ref 0.76–1.27)
GFR, EST AFRICAN AMERICAN: 96 mL/min/{1.73_m2} (ref >59)
GFR, EST NON AFRICAN AMERICAN: 83 mL/min/{1.73_m2} (ref >59)
Glucose: 159 mg/dL — ABNORMAL HIGH (ref 65–99)
Potassium: 4.6 mmol/L (ref 3.5–5.2)
Sodium: 140 mmol/L (ref 134–144)

## 2017-09-15 LAB — LIPID PANEL
Chol/HDL Ratio: 3.1 ratio (ref 0.0–5.0)
Cholesterol, Total: 116 mg/dL (ref 100–199)
HDL: 38 mg/dL — AB (ref >39)
LDL CALC: 66 mg/dL (ref 0–99)
Triglycerides: 58 mg/dL (ref 0–149)
VLDL CHOLESTEROL CAL: 12 mg/dL (ref 5–40)

## 2017-09-15 NOTE — Telephone Encounter (Signed)
Pt aware of labs  

## 2017-10-10 ENCOUNTER — Ambulatory Visit: Payer: Medicare PPO | Admitting: Family Medicine

## 2017-10-10 VITALS — BP 105/65 | HR 87 | Temp 97.4°F | Ht 70.0 in | Wt 160.0 lb

## 2017-10-10 DIAGNOSIS — R260 Ataxic gait: Secondary | ICD-10-CM

## 2017-10-10 DIAGNOSIS — R42 Dizziness and giddiness: Secondary | ICD-10-CM | POA: Diagnosis not present

## 2017-10-10 LAB — GLUCOSE HEMOCUE WAIVED: GLU HEMOCUE WAIVED: 239 mg/dL — AB (ref 65–99)

## 2017-10-10 LAB — HEMOGLOBIN, FINGERSTICK: HEMOGLOBIN: 14.4 g/dL (ref 12.6–17.7)

## 2017-10-10 MED ORDER — ONDANSETRON 4 MG PO TBDP: 4.0000 mg | ORAL_TABLET | Freq: Three times a day (TID) | ORAL | 0 refills | Status: DC | PRN

## 2017-10-10 NOTE — Patient Instructions (Signed)
Your neurologic exam did not demonstrate any abnormalities today.  We are going to check some labs to make sure that this is not a metabolic problem.  I sent in a medication to help with the nausea.  Take your medications as directed.  If you develop any other worrisome symptoms or signs that we discussed, please seek immediate medical attention the emergency department.  I would like you to follow-up on Thursday or Friday with Dr. Laurance Flatten for recheck.  If your symptoms are persistent, we may need to consider imaging of your brain.  Dizziness Dizziness is a common problem. It is a feeling of unsteadiness or light-headedness. You may feel like you are about to faint. Dizziness can lead to injury if you stumble or fall. Anyone can become dizzy, but dizziness is more common in older adults. This condition can be caused by a number of things, including medicines, dehydration, or illness. Follow these instructions at home: Eating and drinking  Drink enough fluid to keep your urine clear or pale yellow. This helps to keep you from becoming dehydrated. Try to drink more clear fluids, such as water.  Do not drink alcohol.  Limit your caffeine intake if told to do so by your health care provider. Check ingredients and nutrition facts to see if a food or beverage contains caffeine.  Limit your salt (sodium) intake if told to do so by your health care provider. Check ingredients and nutrition facts to see if a food or beverage contains sodium. Activity  Avoid making quick movements. ? Rise slowly from chairs and steady yourself until you feel okay. ? In the morning, first sit up on the side of the bed. When you feel okay, stand slowly while you hold onto something until you know that your balance is fine.  If you need to stand in one place for a long time, move your legs often. Tighten and relax the muscles in your legs while you are standing.  Do not drive or use heavy machinery if you feel  dizzy.  Avoid bending down if you feel dizzy. Place items in your home so that they are easy for you to reach without leaning over. Lifestyle  Do not use any products that contain nicotine or tobacco, such as cigarettes and e-cigarettes. If you need help quitting, ask your health care provider.  Try to reduce your stress level by using methods such as yoga or meditation. Talk with your health care provider if you need help to manage your stress. General instructions  Watch your dizziness for any changes.  Take over-the-counter and prescription medicines only as told by your health care provider. Talk with your health care provider if you think that your dizziness is caused by a medicine that you are taking.  Tell a friend or a family member that you are feeling dizzy. If he or she notices any changes in your behavior, have this person call your health care provider.  Keep all follow-up visits as told by your health care provider. This is important. Contact a health care provider if:  Your dizziness does not go away.  Your dizziness or light-headedness gets worse.  You feel nauseous.  You have reduced hearing.  You have new symptoms.  You are unsteady on your feet or you feel like the room is spinning. Get help right away if:  You vomit or have diarrhea and are unable to eat or drink anything.  You have problems talking, walking, swallowing, or using your arms,  hands, or legs.  You feel generally weak.  You are not thinking clearly or you have trouble forming sentences. It may take a friend or family member to notice this.  You have chest pain, abdominal pain, shortness of breath, or sweating.  Your vision changes.  You have any bleeding.  You have a severe headache.  You have neck pain or a stiff neck.  You have a fever. These symptoms may represent a serious problem that is an emergency. Do not wait to see if the symptoms will go away. Get medical help right away.  Call your local emergency services (911 in the [place] Do not drive yourself to the hospital. Summary  Dizziness is a feeling of unsteadiness or light-headedness. This condition can be caused by a number of things, including medicines, dehydration, or illness.  Anyone can become dizzy, but dizziness is more common in older adults.  Drink enough fluid to keep your urine clear or pale yellow. Do not drink alcohol.  Avoid making quick movements if you feel dizzy. Monitor your dizziness for any changes. This information is not intended to replace advice given to you by your health care provider. Make sure you discuss any questions you have with your health care provider. Document Released: 09/29/2000 Document Revised: 05/08/2016 Document Reviewed: 05/08/2016 Elsevier Interactive Patient Education  [employer].

## 2017-10-10 NOTE — Progress Notes (Signed)
Subjective: CC: staggering PCP: Chipper Herb, MD [name] [name] is a 77 y.o. male presenting to clinic today for:  1. Staggering Patient reports onset of sensation of being off balance Friday evening after he attended a ball game.  He notes that he feels dizzy with positional changes like getting up from a seated position to standing position.  He notes associated nausea with vomiting that was nonbloody nonbilious x1 on Friday.  Since that time, he has had intermittent nausea associated with the dizziness.  He denies sensation of room spinning.  He has been not eating well up until today, when he ate a bowl of oatmeal this morning.  He tolerated this food without difficulty.  He did not take his blood sugar medications over the last couple of days because he was not eating.  He does note some cramping in the thighs and feels a little bit weak.  Symptoms have remained stable since Friday.  Denies any upper extremity weakness, lower extremity weakness, numbness, tingling, visual disturbance, difficulty finding words, confusion, headache.  He has been tolerating fluids without difficulty.  No bleeding from any orifice.   ROS: Per HPI  Allergies  Allergen Reactions  . Actos [Pioglitazone Hydrochloride] Swelling    Fluid retention  . Lisinopril Rash  . Invokana [Canagliflozin] Rash  . Niaspan [Niacin Er] Other (See Comments)    Increased blood sugar  . Ramipril Diarrhea   Past Medical History:  Diagnosis Date  . Basal cell carcinoma    left forehead, nose, neck  . Cataract   . Diabetes mellitus   . Diverticulitis   . Erectile dysfunction   . Gastroesophageal reflux disease with hiatal hernia   . Goiter, nontoxic, multinodular    with macrocyst  . Hiatal hernia    With reflex  . Hyperlipidemia   . Inguinal hernia    right side  . Kidney stone 1980  . Leg fracture    Left  . Liver hemangioma   . Nephrolithiasis   . Vitamin D deficiency     Current Outpatient  Medications:  .  aspirin 81 MG tablet, Take 81 mg by mouth daily.  , Disp: , Rfl:  .  BESIVANCE 0.6 % SUSP, , Disp: , Rfl:  .  Camphor-Eucalyptus-Menthol (MEDICATED CHEST RUB) 4.73-1.2-2.6 % OINT, , Disp: , Rfl:  .  Cholecalciferol (VITAMIN D3) 5000 UNITS CAPS, Take 5,000 Units by mouth. Take 1 capsule three times per week, Disp: , Rfl:  .  [employer] CINNAMON PO, Take 2,000 mg by mouth daily.  , Disp: , Rfl:  .  fluticasone (FLONASE) 50 MCG/ACT nasal spray, One to 2 sprays each nostril at bedtime, Disp: 42 g, Rfl: 3 .  Garlic 6553 MG CAPS, Take by mouth daily.  , Disp: , Rfl:  .  glimepiride (AMARYL) 2 MG tablet, Take 1 tablet (2 mg total) by mouth daily before breakfast., Disp: 90 tablet, Rfl: 3 .  glucose blood test strip, Check BS TID and PRN.DX.E11.9, Disp: 100 each, Rfl: 11 .  Insulin Detemir (LEVEMIR FLEXTOUCH) 100 UNIT/ML Pen, Inject up to 50 units once daily, Disp: 45 mL, Rfl: 3 .  levofloxacin (LEVAQUIN) 750 MG tablet, Take 1 tablet (750 mg total) by mouth daily., Disp: 10 tablet, Rfl: 0 .  levothyroxine (SYNTHROID, LEVOTHROID) 75 MCG tablet, Take 1 tablet (75 mcg total) by mouth daily., Disp: 90 tablet, Rfl: 3 .  metFORMIN (GLUCOPHAGE) 1000 MG tablet, Take 1 tablet (1,000 mg total) by mouth 2 (two)  times daily with a meal., Disp: 180 tablet, Rfl: 3 .  quinapril (ACCUPRIL) 20 MG tablet, Take 1 tablet (20 mg total) by mouth at bedtime., Disp: 90 tablet, Rfl: 3 .  ranitidine (ZANTAC) 150 MG tablet, Take 1 tablet (150 mg total) by mouth as needed., Disp: 90 tablet, Rfl: 1 .  UNABLE TO FIND, Apply 1 application topically daily. Diabetic skin relief foot cream, Disp: , Rfl:  Social History   Socioeconomic History  . Marital status: Married    Spouse name: Not on file  . Number of children: Not on file  . Years of education: Not on file  . Highest education level: Not on file  Occupational History  . Occupation: Retired  Scientific laboratory technician  . Financial resource strain: Not on file  . Food  insecurity:    Worry: Not on file    Inability: Not on file  . Transportation needs:    Medical: Not on file    Non-medical: Not on file  Tobacco Use  . Smoking status: Former Smoker    Packs/day: 1.00    Years: 27.00    Pack years: 27.00    Types: Cigarettes    Last attempt to quit: 04/20/1983    Years since quitting: 34.4  . Smokeless tobacco: Never Used  . Tobacco comment: Quit 25 years ago  Substance and Sexual Activity  . Alcohol use: No  . Drug use: No  . Sexual activity: Yes  Lifestyle  . Physical activity:    Days per week: Not on file    Minutes per session: Not on file  . Stress: Not on file  Relationships  . Social connections:    Talks on phone: Not on file    Gets together: Not on file    Attends religious service: Not on file    Active member of club or organization: Not on file    Attends meetings of clubs or organizations: Not on file    Relationship status: Not on file  . Intimate partner violence:    Fear of current or ex partner: Not on file    Emotionally abused: Not on file    Physically abused: Not on file    Forced sexual activity: Not on file  Other Topics Concern  . Not on file  Social History Narrative   Married.  Lives with wife.  Grandson stays with them some.     Family History  Problem Relation Age of Onset  . Heart attack Sister        Sudden death in her 20s.  No clear as to the cause  . Heart disease Brother        Valvular  . Heart disease Mother 80       stent, lived to be 72  . Cancer Father        ?lung  . Early death Brother 3       spinal menigitis  . Diabetes Other     Objective: Office vital signs reviewed. BP 105/65   Pulse 87   Temp (!) 97.4 F (36.3 C) (Oral)   Ht _0  (1.778 m)   Wt 160 lb (72.6 kg)   BMI 22.96 kg/m   Physical Examination:  General: Awake, alert, well nourished, No acute distress HEENT: Normal    Neck: No masses palpated. No lymphadenopathy    Ears: Tympanic membranes intact, normal  light reflex, no erythema, no bulging    Eyes: PERRLA, extraocular membranes intact, sclera white  Nose: nasal turbinates moist, no nasal discharge    Throat: moist mucus membranes, no erythema, no tonsillar exudate.  Symmetric rise of palate noted.  Airway is patent Cardio: regular rate and rhythm, S1S2 heard, no murmurs appreciated Pulm: clear to auscultation bilaterally, no wheezes, rhonchi or rales; normal work of breathing on room air Extremities: warm, well perfused, No edema, cyanosis or clubbing; +2 pulses bilaterally MSK: unsteady gait Neuro: 5/5 UE and LE Strength and light touch sensation grossly intact, cranial nerves II through XII grossly intact.  Alert and oriented x3.  Normal upper extremity and lower extremity cell bowel testing.  Negative Romberg.  No nystagmus.  Assessment/ Plan: 77 y.o. male   1. Staggering Unsteady gait/dizziness of uncertain etiology at this point.  His neurologic exam was totally unremarkable except for unsteady gait with ambulation.  He had a negative Romberg and no evidence of cerebellar injury on exam.  Doubt CVA at this point.  However, we did discuss that if metabolic panel is unremarkable and he has persistent symptoms despite controlling of the nausea and increased p.o. intake, we should consider imaging of the brain.  I have asked that he follow-up with PCP in the next 2 to 3 days for recheck.  Point-of-care glucose did demonstrate elevation in the blood sugar.  This may be secondary to not using his medications for the last couple of days.  Hemoglobin was stable.  No evidence of anemia.  Reasons for emergent evaluation emergency department discussed.  Patient was good understanding.  He will follow-up as directed. - Hemoglobin, fingerstick - Glucose Hemocue Waived - CMP14+EGFR - CBC with Differential  2. Dizziness   Orders Placed This Encounter  Procedures  . Hemoglobin, fingerstick  . Glucose Hemocue Waived  . CMP14+EGFR  . CBC with  Differential   Meds ordered this encounter  Medications  . ondansetron (ZOFRAN ODT) 4 MG disintegrating tablet    Sig: Take 1 tablet (4 mg total) by mouth every 8 (eight) hours as needed for nausea or vomiting.    Dispense:  20 tablet    Refill:  [place], DO [place] [phone]

## 2017-10-11 ENCOUNTER — Other Ambulatory Visit: Payer: Medicare PPO

## 2017-10-11 ENCOUNTER — Other Ambulatory Visit: Payer: Self-pay | Admitting: Family Medicine

## 2017-10-11 DIAGNOSIS — R42 Dizziness and giddiness: Secondary | ICD-10-CM

## 2017-10-11 LAB — CMP14+EGFR
A/G RATIO: 1.4 (ref 1.2–2.2)
ALT: 45 IU/L — ABNORMAL HIGH (ref 0–44)
AST: 54 IU/L — ABNORMAL HIGH (ref 0–40)
Albumin: 4 g/dL (ref 3.5–4.8)
Alkaline Phosphatase: 73 IU/L (ref 39–117)
BUN / CREAT RATIO: 27 — AB (ref 10–24)
BUN: 28 mg/dL — ABNORMAL HIGH (ref 8–27)
Bilirubin Total: 0.4 mg/dL (ref 0.0–1.2)
CALCIUM: 9.7 mg/dL (ref 8.6–10.2)
CO2: 21 mmol/L (ref 20–29)
Chloride: 97 mmol/L (ref 96–106)
Creatinine, Ser: 1.05 mg/dL (ref 0.76–1.27)
GFR calc Af Amer: 79 mL/min/{1.73_m2} (ref >59)
GFR, EST NON AFRICAN AMERICAN: 68 mL/min/{1.73_m2} (ref >59)
GLOBULIN, TOTAL: 2.9 g/dL (ref 1.5–4.5)
Glucose: 210 mg/dL — ABNORMAL HIGH (ref 65–99)
POTASSIUM: 4.1 mmol/L (ref 3.5–5.2)
SODIUM: 134 mmol/L (ref 134–144)
Total Protein: 6.9 g/dL (ref 6.0–8.5)

## 2017-10-11 LAB — CBC WITH DIFFERENTIAL/PLATELET
BASOS: 0 %
Basophils Absolute: 0 10*3/uL (ref 0.0–0.2)
EOS (ABSOLUTE): 0.1 10*3/uL (ref 0.0–0.4)
Eos: 1 %
Hematocrit: 43.7 % (ref 37.5–51.0)
Hemoglobin: 14.8 g/dL (ref 13.0–17.7)
Immature Grans (Abs): 0 10*3/uL (ref 0.0–0.1)
Immature Granulocytes: 0 %
Lymphocytes Absolute: 1.7 10*3/uL (ref 0.7–3.1)
Lymphs: 14 %
MCH: 27.8 pg (ref 26.6–33.0)
MCHC: 33.9 g/dL (ref 31.5–35.7)
MCV: 82 fL (ref 79–97)
MONOS ABS: 1.3 10*3/uL — AB (ref 0.1–0.9)
Monocytes: 11 %
Neutrophils Absolute: 9.1 10*3/uL — ABNORMAL HIGH (ref 1.4–7.0)
Neutrophils: 74 %
PLATELETS: 237 10*3/uL (ref 150–450)
RBC: 5.32 x10E6/uL (ref 4.14–5.80)
RDW: 14.1 % (ref 12.3–15.4)
WBC: 12.2 10*3/uL — ABNORMAL HIGH (ref 3.4–10.8)

## 2017-10-11 LAB — MICROSCOPIC EXAMINATION
RBC MICROSCOPIC, UA: NONE SEEN /HPF (ref 0–2)
RENAL EPITHEL UA: NONE SEEN /HPF

## 2017-10-11 LAB — URINALYSIS, COMPLETE
Bilirubin, UA: NEGATIVE
NITRITE UA: NEGATIVE
PH UA: 5.5 (ref 5.0–7.5)
Specific Gravity, UA: 1.015 (ref 1.005–1.030)
Urobilinogen, Ur: 0.2 mg/dL (ref 0.2–1.0)

## 2017-10-12 ENCOUNTER — Other Ambulatory Visit: Payer: Self-pay | Admitting: Family Medicine

## 2017-10-12 MED ORDER — CEPHALEXIN 500 MG PO CAPS: 500.0000 mg | ORAL_CAPSULE | Freq: Two times a day (BID) | ORAL | 0 refills | Status: AC

## 2017-11-10 ENCOUNTER — Emergency Department (HOSPITAL_COMMUNITY)
Admission: EM | Admit: 2017-11-10 | Discharge: 2017-11-11 | Disposition: A | Discharge status: Home / Self Care | Payer: Medicare PPO | Attending: Emergency Medicine | Admitting: Emergency Medicine

## 2017-11-10 ENCOUNTER — Emergency Department (HOSPITAL_COMMUNITY): Payer: Medicare PPO

## 2017-11-10 ENCOUNTER — Ambulatory Visit: Payer: Medicare PPO

## 2017-11-10 ENCOUNTER — Encounter (HOSPITAL_COMMUNITY): Payer: Self-pay | Admitting: Emergency Medicine

## 2017-11-10 DIAGNOSIS — Z7982 Long term (current) use of aspirin: Secondary | ICD-10-CM | POA: Insufficient documentation

## 2017-11-10 DIAGNOSIS — Z79899 Other long term (current) drug therapy: Secondary | ICD-10-CM | POA: Insufficient documentation

## 2017-11-10 DIAGNOSIS — I10 Essential (primary) hypertension: Secondary | ICD-10-CM | POA: Diagnosis not present

## 2017-11-10 DIAGNOSIS — E785 Hyperlipidemia, unspecified: Secondary | ICD-10-CM | POA: Insufficient documentation

## 2017-11-10 DIAGNOSIS — Z794 Long term (current) use of insulin: Secondary | ICD-10-CM | POA: Insufficient documentation

## 2017-11-10 DIAGNOSIS — Z87891 Personal history of nicotine dependence: Secondary | ICD-10-CM | POA: Diagnosis not present

## 2017-11-10 DIAGNOSIS — N3001 Acute cystitis with hematuria: Secondary | ICD-10-CM | POA: Diagnosis not present

## 2017-11-10 DIAGNOSIS — E11319 Type 2 diabetes mellitus with unspecified diabetic retinopathy without macular edema: Secondary | ICD-10-CM | POA: Insufficient documentation

## 2017-11-10 DIAGNOSIS — R531 Weakness: Secondary | ICD-10-CM | POA: Diagnosis not present

## 2017-11-10 DIAGNOSIS — R0902 Hypoxemia: Secondary | ICD-10-CM | POA: Diagnosis not present

## 2017-11-10 DIAGNOSIS — R110 Nausea: Secondary | ICD-10-CM | POA: Diagnosis not present

## 2017-11-10 LAB — URINALYSIS, ROUTINE W REFLEX MICROSCOPIC
BILIRUBIN URINE: NEGATIVE
GLUCOSE, UA: NEGATIVE mg/dL
Ketones, ur: 20 mg/dL — AB
Nitrite: NEGATIVE
PH: 6 (ref 5.0–8.0)
Protein, ur: NEGATIVE mg/dL
SPECIFIC GRAVITY, URINE: 1.014 (ref 1.005–1.030)

## 2017-11-10 LAB — TSH: TSH: 0.996 u[IU]/mL (ref 0.350–4.500)

## 2017-11-10 LAB — CBG MONITORING, ED: GLUCOSE-CAPILLARY: 94 mg/dL (ref 70–99)

## 2017-11-10 LAB — I-STAT CG4 LACTIC ACID, ED
Lactic Acid, Venous: 0.77 mmol/L (ref 0.5–1.9)
Lactic Acid, Venous: 0.95 mmol/L (ref 0.5–1.9)

## 2017-11-10 LAB — CBC
HEMATOCRIT: 40.7 % (ref 39.0–52.0)
HEMOGLOBIN: 13.3 g/dL (ref 13.0–17.0)
MCH: 27.5 pg (ref 26.0–34.0)
MCHC: 32.7 g/dL (ref 30.0–36.0)
MCV: 84.3 fL (ref 78.0–100.0)
Platelets: 200 10*3/uL (ref 150–400)
RBC: 4.83 MIL/uL (ref 4.22–5.81)
RDW: 13.5 % (ref 11.5–15.5)
WBC: 12.9 10*3/uL — ABNORMAL HIGH (ref 4.0–10.5)

## 2017-11-10 LAB — BASIC METABOLIC PANEL
Anion gap: 9 (ref 5–15)
BUN: 12 mg/dL (ref 8–23)
CHLORIDE: 103 mmol/L (ref 98–111)
CO2: 22 mmol/L (ref 22–32)
CREATININE: 0.83 mg/dL (ref 0.61–1.24)
Calcium: 8.9 mg/dL (ref 8.9–10.3)
GFR calc Af Amer: >60 mL/min (ref >60)
GFR calc non Af Amer: >60 mL/min (ref >60)
Glucose, Bld: 97 mg/dL (ref 70–99)
Potassium: 3.7 mmol/L (ref 3.5–5.1)
Sodium: 134 mmol/L — ABNORMAL LOW (ref 135–145)

## 2017-11-10 LAB — CK: CK TOTAL: 555 U/L — AB (ref 49–397)

## 2017-11-10 MED ORDER — LACTATED RINGERS IV BOLUS
1000.0000 mL | Freq: Once | INTRAVENOUS | Status: AC
  Administered 2017-11-10: 1000 mL via INTRAVENOUS

## 2017-11-10 MED ORDER — SODIUM CHLORIDE 0.9 % IV SOLN
1.0000 g | Freq: Once | INTRAVENOUS | Status: AC
  Administered 2017-11-10: 1 g via INTRAVENOUS
  Filled 2017-11-10: qty 10

## 2017-11-10 MED ORDER — ONDANSETRON HCL 4 MG/2ML IJ SOLN
4.0000 mg | Freq: Once | INTRAMUSCULAR | Status: AC
  Administered 2017-11-10: 4 mg via INTRAVENOUS
  Filled 2017-11-10: qty 2

## 2017-11-10 MED ORDER — CEFDINIR 300 MG PO CAPS: 300.0000 mg | ORAL_CAPSULE | Freq: Two times a day (BID) | ORAL | 0 refills | Status: AC

## 2017-11-10 NOTE — ED Provider Notes (Signed)
[facility] EMERGENCY DEPARTMENT Provider Note   CSN: [id number] Arrival date & time: 11/10/17  1805  History   Chief Complaint Chief Complaint  Patient presents with  . Fatigue  . Nausea  . Urinary Frequency   HPI  Patient is a 77 year old male with history of DM and prior UTIs per patient presents to the ED for unsteadiness and generalized weakness worse in the legs.  He states symptoms been gradually worsening over the last 4 to 5 days.  He states he had a similar episode of weakness about 6 weeks ago which resolved without intervention.  With his weakness, he has had multiple falls over the last few days.  He denies any recent blow to head, neck pain, or LOC.  He is not anticoagulated.  He also complains of mild soreness in his bilateral thighs when trying to walk.  No recent back pain, numbness, or bowel/bladder symptoms.  No vertigo, vision changes, or speech changes.  No report of confusion.  No vomiting, abdominal pain, or diarrhea.  Past Medical History:  Diagnosis Date  . Basal cell carcinoma    left forehead, nose, neck  . Cataract   . Diabetes mellitus   . Diverticulitis   . Erectile dysfunction   . Gastroesophageal reflux disease with hiatal hernia   . Goiter, nontoxic, multinodular    with macrocyst  . Hiatal hernia    With reflex  . Hyperlipidemia   . Inguinal hernia    right side  . Kidney stone 1980  . Leg fracture    Left  . Liver hemangioma   . Nephrolithiasis   . Vitamin D deficiency     Patient Active Problem List   Diagnosis Date Noted  . Diabetic retinopathy ([place]) 05/22/2017  . Peripheral vascular insufficiency ([place]) 03/18/2016  . Family history of heart disease 03/18/2016  . Right inguinal hernia 12/13/2013  . Essential hypertension 12/13/2013  . BPH (benign prostatic hyperplasia) 12/13/2013  . Hypothyroidism 08/07/2013  . Vitamin D deficiency 04/02/2013  . Goiter, nontoxic, multinodular   . Kidney stone   . Hyperlipidemia  01/15/2009  . ABNORMAL ELECTROCARDIOGRAM 01/15/2009  . Type 2 diabetes mellitus treated with insulin ([place]) 01/09/2009  . HIATAL HERNIA 01/09/2009  . BASAL CELL CARCINOMA, HX OF 01/09/2009  . DIVERTICULITIS, HX OF 01/09/2009    Past Surgical History:  Procedure Laterality Date  . BACK SURGERY  1984  . CATARACT EXTRACTION Bilateral   . EYE SURGERY    . LACERATION REPAIR  2008   Dr. Lenon Curt -to hand  . TIBIA FRACTURE SURGERY     left leg        Home Medications    Prior to Admission medications   Medication Sig Start Date End Date Taking? Authorizing Provider  aspirin 81 MG tablet Take 81 mg by mouth daily.      [provider]  BESIVANCE 0.6 % SUSP  05/12/16   [provider]  Camphor-Eucalyptus-Menthol (MEDICATED CHEST RUB) 4.73-1.2-2.6 % OINT  10/18/14   [provider]  cefdinir (OMNICEF) 300 MG capsule Take 1 capsule (300 mg total) by mouth 2 (two) times daily for 14 days. 11/10/17 11/24/17  Prescilla Sours, MD  Cholecalciferol (VITAMIN D3) 5000 UNITS CAPS Take 5,000 Units by mouth. Take 1 capsule three times per week    [provider]  [employer]S CINNAMON PO Take 2,000 mg by mouth daily.      [provider]  fluticasone (FLONASE) 50 MCG/ACT nasal spray One  to 2 sprays each nostril at bedtime 04/28/17   Chipper Herb, MD  Garlic 6568 MG CAPS Take by mouth daily.      [provider]  glimepiride (AMARYL) 2 MG tablet Take 1 tablet (2 mg total) by mouth daily before breakfast. 04/28/17   Chipper Herb, MD  glucose blood test strip Check BS TID and PRN.DX.E11.9 02/28/15   Chipper Herb, MD  Insulin Detemir (LEVEMIR FLEXTOUCH) 100 UNIT/ML Pen Inject up to 50 units once daily 06/25/15   Chipper Herb, MD  levofloxacin (LEVAQUIN) 750 MG tablet Take 1 tablet (750 mg total) by mouth daily. 07/15/17   Timmothy Euler, MD  levothyroxine (SYNTHROID, LEVOTHROID) 75 MCG tablet Take 1 tablet (75 mcg total) by mouth daily. 04/28/17   Chipper Herb, MD  metFORMIN (GLUCOPHAGE) 1000 MG tablet Take 1 tablet (1,000 mg total) by mouth 2 (two) times daily with a meal. 04/28/17   Chipper Herb, MD  ondansetron (ZOFRAN ODT) 4 MG disintegrating tablet Take 1 tablet (4 mg total) by mouth every 8 (eight) hours as needed for nausea or vomiting. 10/10/17   Ronnie Doss M, DO  quinapril (ACCUPRIL) 20 MG tablet Take 1 tablet (20 mg total) by mouth at bedtime. 04/28/17   Chipper Herb, MD  ranitidine (ZANTAC) 150 MG tablet Take 1 tablet (150 mg total) by mouth as needed. 03/18/16   Chipper Herb, MD  UNABLE TO FIND Apply 1 application topically daily. Diabetic skin relief foot cream 10/18/14   [provider]    Family History Family History  Problem Relation Age of Onset  . Heart attack Sister        Sudden death in her 32s.  No clear as to the cause  . Heart disease Brother        Valvular  . Heart disease Mother 63       stent, lived to be 16  . Cancer Father        ?lung  . Early death Brother 3       spinal menigitis  . Diabetes Other     Social History Social History   Tobacco Use  . Smoking status: Former Smoker    Packs/day: 1.00    Years: 27.00    Pack years: 27.00    Types: Cigarettes    Last attempt to quit: 04/20/1983    Years since quitting: 34.5  . Smokeless tobacco: Never Used  . Tobacco comment: Quit 25 years ago  Substance Use Topics  . Alcohol use: No  . Drug use: No     Allergies   Actos [pioglitazone hydrochloride]; Lisinopril; Invokana [canagliflozin]; Niaspan [niacin er]; and Ramipril   Review of Systems Review of Systems  Constitutional: Negative for fever.  HENT: Negative for congestion.   Eyes: Negative for visual disturbance.  Respiratory: Negative for cough and shortness of breath.   Cardiovascular: Negative for chest pain.  Gastrointestinal: Negative for abdominal pain, diarrhea and vomiting.  Genitourinary: Positive for frequency and urgency. Negative for dysuria.    Musculoskeletal: Negative for back pain and neck pain.  Skin: Negative for rash.  Neurological: Positive for weakness. Negative for dizziness, syncope, numbness and headaches.  All other systems reviewed and are negative.    Physical Exam Updated Vital Signs BP 135/73   Pulse 75   Temp 98.9 F (37.2 C) (Oral)   Resp 17   Ht 5\' 10"  (1.778 m)   Wt 74.8 kg (165 lb)  SpO2 98%   BMI 23.68 kg/m   Physical Exam  Constitutional: He is oriented to person, place, and time. No distress.  HENT:  Head: Normocephalic and atraumatic.  Mouth/Throat: Oropharynx is clear and moist.  Eyes: Pupils are equal, round, and reactive to light.  Neck: Neck supple. No JVD present.  Cardiovascular: Normal rate, regular rhythm, normal heart sounds and intact distal pulses.  No murmur heard. Pulmonary/Chest: Breath sounds normal. No respiratory distress. He has no wheezes. He has no rales.  Abdominal: Soft. He exhibits no distension and no mass. There is no tenderness. There is no guarding.  Musculoskeletal: Normal range of motion. He exhibits no edema.  No midline or paraspinal tenderness along the C, T, or L-spine.  Abrasion over the right forearm.  Otherwise, all extremities show full ROM without tenderness or deformity.  Neurological: He is alert and oriented to person, place, and time.  Speech is fluent.  CN III-XII tested and found to be intact.  5/5 strength in all extremities with sensation intact to light touch throughout.  Specifically, 5/5 strength with hip flexion, knee extension, ankle flexion/extension, and EHL.  No dysmetria on finger-to-nose.  Unsteady gait, listing backwards.  Skin: Skin is warm and dry.  Psychiatric: He has a normal mood and affect. His behavior is normal.  Nursing note and vitals reviewed.    ED Treatments / Results  Labs (all labs ordered are listed, but only abnormal results are displayed) Labs Reviewed  URINALYSIS, ROUTINE W REFLEX MICROSCOPIC - Abnormal;  Notable for the following components:      Result Value   APPearance HAZY (*)    Hgb urine dipstick SMALL (*)    Ketones, ur 20 (*)    Leukocytes, UA SMALL (*)    Bacteria, UA MANY (*)    All other components within normal limits  BASIC METABOLIC PANEL - Abnormal; Notable for the following components:   Sodium 134 (*)    All other components within normal limits  CBC - Abnormal; Notable for the following components:   WBC 12.9 (*)    All other components within normal limits  CK - Abnormal; Notable for the following components:   Total CK 555 (*)    All other components within normal limits  URINE CULTURE  TSH  I-STAT CG4 LACTIC ACID, ED  CBG MONITORING, ED  I-STAT CG4 LACTIC ACID, ED    EKG EKG Interpretation  Date/Time:  Thursday November 10 2017 20:16:00 EDT Ventricular Rate:  84 PR Interval:    QRS Duration: 104 QT Interval:  387 QTC Calculation: 458 R Axis:   14 Text Interpretation:  Sinus rhythm Baseline wander in lead(s) V1 No old tracing to compare Confirmed by Isla Pence [phone]) on 11/10/2017 8:19:26 PM   Radiology Dg Chest 2 View  Result Date: 11/10/2017 CLINICAL DATA:  Weakness.  Chills. EXAM: CHEST - 2 VIEW COMPARISON:  Radiographs of Sep 14, 2017. FINDINGS: The heart size and mediastinal contours are within normal limits. Both lungs are clear. No pneumothorax or pleural effusion is noted. The visualized skeletal structures are unremarkable. IMPRESSION: No active cardiopulmonary disease. Electronically Signed   By: Marijo Conception, M.D.   On: 11/10/2017 19:22   Ct Head Wo Contrast  Result Date: 11/10/2017 CLINICAL DATA:  Nausea and generalized weakness EXAM: CT HEAD WITHOUT CONTRAST TECHNIQUE: Contiguous axial images were obtained from the base of the skull through the vertex without intravenous contrast. COMPARISON:  None. FINDINGS: Brain: No acute territorial infarction, hemorrhage or  intracranial mass. Mild to moderate atrophy. Mild small vessel ischemic  changes of the white matter. Ventricles are nonenlarged Vascular: No hyperdense vessels.  Carotid vascular calcification Skull: No fracture. Prominent degenerative change at C1-C2 articulation Sinuses/Orbits: Mucosal thickening in the left maxillary and ethmoid sinuses. No acute orbital abnormality. Other: None IMPRESSION: 1. No CT evidence for acute intracranial abnormality. 2. Atrophy and mild small vessel ischemic changes of the white matter Electronically Signed   By: Donavan Foil M.D.   On: 11/10/2017 20:10    Procedures Procedures (including critical care time)  Medications Ordered in ED Medications  lactated ringers bolus 1,000 mL (0 mLs Intravenous Stopped 11/10/17 2146)  ondansetron (ZOFRAN) injection 4 mg (4 mg Intravenous Given 11/10/17 2025)  cefTRIAXone (ROCEPHIN) 1 g in sodium chloride 0.9 % 100 mL IVPB (1 g Intravenous New Bag/Given 11/10/17 2145)     Initial Impression / Assessment and Plan / ED Course  I have reviewed the triage vital signs and the nursing notes.  Pertinent labs & imaging results that were available during my care of the patient were reviewed by me and considered in my medical decision making (see chart for details).  This is a 77 year old male with history of UTI and DM presenting to the ED for generalized weakness.  Clinical picture consistent with UTI.  UA suspicious for infection.  Culture sent.  Do not suspect CVA with reassuring neuro exam.  He does have borderline leukocytosis at 12.9 but otherwise does not have signs of sepsis.  No flank pain to suggest pyelonephritis.  Patient given IV fluids and Rocephin with excellent improvement.  On recheck, patient ambulating freely in the hallway and requesting discharge.  Shared decision-making gauge regarding risks of fall or worsening infection at home and he strongly prefers discharge.  I feel this is reasonable given his improvement here.  We had also considered CVA, NPH, myopathy/myositis, or L-spine pathology,  but exam and imaging reassuring against these processes.  Regarding choice of antibiotic, multiple cultures in the past have grown Morganella morganii which has been fairly resistant.  It appears that he has been treated with Levaquin in the past.  However, his most recent culture was not tested for third-generation cephalosporin and prior culture with this pathogen was sensitive to Rocephin.  Given risks of fluoroquinolone usage, particularly in this elderly male who takes a sulfonylurea, I feel a trial of a third-generation cephalosporin is more appropriate given his improvement today after Rocephin.  I also called our microbiology lab and asked them to specifically test his urine culture for a third-generation cephalosporin MIC.  Risk of treatment failure and need for close follow-up with PCP to follow-up on culture results was also discussed.  Wife and granddaughter are in agreement with this plan.  Patient informed of all ED findings. Return precautions and follow up plan reviewed. All questions answered.   Final Clinical Impressions(s) / ED Diagnoses   Final diagnoses:  Acute cystitis with hematuria    ED Discharge Orders        Ordered    cefdinir (OMNICEF) 300 MG capsule  2 times daily     11/10/17 2315       Prescilla Sours, MD 11/11/17 Phillip Heal    Isla Pence, MD 11/11/17 1729

## 2017-11-10 NOTE — ED Notes (Signed)
Pt refused discharge vitals 

## 2017-11-10 NOTE — ED Notes (Signed)
Lavender, Light Green, Dark Green x2, Pink, and Blue tops collected from Pt. 1 Dark Green top on rocker in [employer], the rest sent down to [employer].

## 2017-11-10 NOTE — ED Triage Notes (Signed)
Pt presents with [name] [name] rescue from home with nausea, gen. Weakness, increased UOP; hx DM with CBG 90 today; PCP is Redge Gainer, MD; VS with EMS were 130/75, HR 95, 95%

## 2017-11-10 NOTE — ED Notes (Signed)
Pt informed he needs to provide a UA when possible. Pt given urinal / verbalized understanding.   Family at bedside at this time.

## 2017-11-13 LAB — URINE CULTURE: Culture: >=100000 — AB

## 2017-11-14 ENCOUNTER — Telehealth: Payer: Self-pay | Admitting: Emergency Medicine

## 2017-11-14 NOTE — Telephone Encounter (Signed)
Post ED Visit - Positive Culture Follow-up  Culture report reviewed by antimicrobial stewardship pharmacist:  []  Elenor Quinones, Pharm.D. []  Heide Guile, Pharm.D., BCPS AQ-ID []  Parks Neptune, Pharm.D., BCPS []  Alycia Rossetti, Pharm.D., BCPS []  [place], Pharm.D., BCPS, AAHIVP []  Legrand Como, Pharm.D., BCPS, AAHIVP [x]  Salome Arnt, PharmD, BCPS []  Johnnette Gourd, PharmD, BCPS []  Hughes Better, PharmD, BCPS []  Leeroy Cha, PharmD  Positive urine culture Treated with cefdinir, organism sensitive to the same and no further patient follow-up is required at this time.  Hazle Nordmann 11/14/2017, 11:28 AM

## 2017-11-24 ENCOUNTER — Emergency Department (HOSPITAL_COMMUNITY)
Admission: EM | Admit: 2017-11-24 | Discharge: 2017-11-24 | Disposition: A | Discharge status: Home / Self Care | Payer: Medicare PPO | Attending: Emergency Medicine | Admitting: Emergency Medicine

## 2017-11-24 ENCOUNTER — Emergency Department (HOSPITAL_COMMUNITY): Payer: Medicare PPO

## 2017-11-24 ENCOUNTER — Encounter (HOSPITAL_COMMUNITY): Payer: Self-pay

## 2017-11-24 DIAGNOSIS — Z79899 Other long term (current) drug therapy: Secondary | ICD-10-CM | POA: Insufficient documentation

## 2017-11-24 DIAGNOSIS — Y929 Unspecified place or not applicable: Secondary | ICD-10-CM | POA: Insufficient documentation

## 2017-11-24 DIAGNOSIS — S40211A Abrasion of right shoulder, initial encounter: Secondary | ICD-10-CM | POA: Diagnosis not present

## 2017-11-24 DIAGNOSIS — S0990XA Unspecified injury of head, initial encounter: Secondary | ICD-10-CM | POA: Diagnosis not present

## 2017-11-24 DIAGNOSIS — Y939 Activity, unspecified: Secondary | ICD-10-CM | POA: Insufficient documentation

## 2017-11-24 DIAGNOSIS — E039 Hypothyroidism, unspecified: Secondary | ICD-10-CM | POA: Insufficient documentation

## 2017-11-24 DIAGNOSIS — Z7982 Long term (current) use of aspirin: Secondary | ICD-10-CM | POA: Diagnosis not present

## 2017-11-24 DIAGNOSIS — E119 Type 2 diabetes mellitus without complications: Secondary | ICD-10-CM | POA: Diagnosis not present

## 2017-11-24 DIAGNOSIS — Z794 Long term (current) use of insulin: Secondary | ICD-10-CM | POA: Insufficient documentation

## 2017-11-24 DIAGNOSIS — R079 Chest pain, unspecified: Secondary | ICD-10-CM | POA: Diagnosis not present

## 2017-11-24 DIAGNOSIS — M542 Cervicalgia: Secondary | ICD-10-CM | POA: Diagnosis not present

## 2017-11-24 DIAGNOSIS — I10 Essential (primary) hypertension: Secondary | ICD-10-CM | POA: Diagnosis not present

## 2017-11-24 DIAGNOSIS — Y999 Unspecified external cause status: Secondary | ICD-10-CM | POA: Insufficient documentation

## 2017-11-24 DIAGNOSIS — S40212A Abrasion of left shoulder, initial encounter: Secondary | ICD-10-CM | POA: Insufficient documentation

## 2017-11-24 DIAGNOSIS — S199XXA Unspecified injury of neck, initial encounter: Secondary | ICD-10-CM | POA: Diagnosis not present

## 2017-11-24 DIAGNOSIS — Z87891 Personal history of nicotine dependence: Secondary | ICD-10-CM | POA: Insufficient documentation

## 2017-11-24 DIAGNOSIS — Z85828 Personal history of other malignant neoplasm of skin: Secondary | ICD-10-CM | POA: Diagnosis not present

## 2017-11-24 DIAGNOSIS — S4992XA Unspecified injury of left shoulder and upper arm, initial encounter: Secondary | ICD-10-CM | POA: Diagnosis present

## 2017-11-24 MED ORDER — METHOCARBAMOL 500 MG PO TABS: 500.0000 mg | ORAL_TABLET | Freq: Three times a day (TID) | ORAL | 0 refills | Status: DC

## 2017-11-24 MED ORDER — METHOCARBAMOL 500 MG PO TABS
500.0000 mg | ORAL_TABLET | Freq: Once | ORAL | Status: AC
  Administered 2017-11-24: 500 mg via ORAL
  Filled 2017-11-24: qty 1

## 2017-11-24 MED ORDER — ACETAMINOPHEN 500 MG PO TABS
1000.0000 mg | ORAL_TABLET | Freq: Once | ORAL | Status: AC
  Administered 2017-11-24: 1000 mg via ORAL
  Filled 2017-11-24: qty 2
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
## 2017-11-24 NOTE — Discharge Instructions (Addendum)
Your blood pressure is slightly elevated.  Your vital signs are otherwise within normal limits.  Please have your blood pressure rechecked soon.  The CT scan of your head and neck are negative for any acute problems.  You do have some arthritis and some degenerative disc disease changes of your neck.  The x-ray of your chest is negative for any acute problems to your lungs or to your ribs.  You were noted to have some abrasions about your neck and shoulder.  You can expect to be sore over the next few days.  Please use Tylenol every 4 hours, or Aleve every 12 hours for your soreness.  Please use Robaxin for spasm pain.  Robaxin may cause drowsiness, and/or lightheadedness.  Please use this medication with caution.  Please see Dr. Laurance Flatten, or return to the emergency department if any changes in your condition, problems, or concerns.
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
## 2018-01-25 ENCOUNTER — Encounter: Payer: Self-pay | Admitting: Family Medicine

## 2018-01-25 ENCOUNTER — Ambulatory Visit: Payer: Medicare PPO | Admitting: Family Medicine

## 2018-01-25 VITALS — BP 134/68 | HR 69 | Temp 96.3°F | Ht 70.0 in | Wt 163.0 lb

## 2018-01-25 DIAGNOSIS — R300 Dysuria: Secondary | ICD-10-CM | POA: Diagnosis not present

## 2018-01-25 DIAGNOSIS — I10 Essential (primary) hypertension: Secondary | ICD-10-CM

## 2018-01-25 DIAGNOSIS — Z8249 Family history of ischemic heart disease and other diseases of the circulatory system: Secondary | ICD-10-CM

## 2018-01-25 DIAGNOSIS — Z794 Long term (current) use of insulin: Secondary | ICD-10-CM | POA: Diagnosis not present

## 2018-01-25 DIAGNOSIS — I739 Peripheral vascular disease, unspecified: Secondary | ICD-10-CM

## 2018-01-25 DIAGNOSIS — J301 Allergic rhinitis due to pollen: Secondary | ICD-10-CM | POA: Diagnosis not present

## 2018-01-25 DIAGNOSIS — E782 Mixed hyperlipidemia: Secondary | ICD-10-CM | POA: Diagnosis not present

## 2018-01-25 DIAGNOSIS — E559 Vitamin D deficiency, unspecified: Secondary | ICD-10-CM

## 2018-01-25 DIAGNOSIS — E119 Type 2 diabetes mellitus without complications: Secondary | ICD-10-CM

## 2018-01-25 DIAGNOSIS — E039 Hypothyroidism, unspecified: Secondary | ICD-10-CM

## 2018-01-25 DIAGNOSIS — E1136 Type 2 diabetes mellitus with diabetic cataract: Secondary | ICD-10-CM

## 2018-01-25 DIAGNOSIS — Z23 Encounter for immunization: Secondary | ICD-10-CM

## 2018-01-25 LAB — MICROSCOPIC EXAMINATION
EPITHELIAL CELLS (NON RENAL): NONE SEEN /HPF (ref 0–10)
RENAL EPITHEL UA: NONE SEEN /HPF
WBC, UA: >30 /hpf — AB (ref 0–5)

## 2018-01-25 LAB — BAYER DCA HB A1C WAIVED: HB A1C (BAYER DCA - WAIVED): 8.1 % — ABNORMAL HIGH (ref <7.0)

## 2018-01-25 LAB — URINALYSIS, COMPLETE
BILIRUBIN UA: NEGATIVE
Glucose, UA: NEGATIVE
Ketones, UA: NEGATIVE
NITRITE UA: POSITIVE — AB
PH UA: 6.5 (ref 5.0–7.5)
PROTEIN UA: NEGATIVE
Specific Gravity, UA: 1.02 (ref 1.005–1.030)
UUROB: 0.2 mg/dL (ref 0.2–1.0)

## 2018-01-25 MED ORDER — QUINAPRIL HCL 20 MG PO TABS: 20.0000 mg | ORAL_TABLET | Freq: Every day | ORAL | 3 refills | Status: DC

## 2018-01-25 MED ORDER — GLIMEPIRIDE 2 MG PO TABS: 2.0000 mg | ORAL_TABLET | Freq: Every day | ORAL | 3 refills | Status: DC

## 2018-01-25 MED ORDER — FLUTICASONE PROPIONATE 50 MCG/ACT NA SUSP
NASAL | 3 refills | Status: DC
Start: 2018-01-25 — End: 2018-09-27

## 2018-01-25 MED ORDER — LEVOTHYROXINE SODIUM 75 MCG PO TABS: 75.0000 ug | ORAL_TABLET | Freq: Every day | ORAL | 3 refills | Status: DC

## 2018-01-25 MED ORDER — METFORMIN HCL 1000 MG PO TABS: 1000.0000 mg | ORAL_TABLET | Freq: Two times a day (BID) | ORAL | 3 refills | Status: DC
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
## 2018-01-25 NOTE — Patient Instructions (Addendum)
Medicare Annual Wellness Visit  [facility] and the medical providers at [facility] strive to bring you the best medical care.  In doing so we not only want to address your current medical conditions and concerns but also to detect new conditions early and prevent illness, disease and health-related problems.    Medicare offers a yearly Wellness Visit which allows our clinical staff to assess your need for preventative services including immunizations, lifestyle education, counseling to decrease risk of preventable diseases and screening for fall risk and other medical concerns.    This visit is provided free of charge (no copay) for all Medicare recipients. The clinical pharmacists at [facility] have begun to conduct these Wellness Visits which will also include a thorough review of all your medications.    As you primary medical provider recommend that you make an appointment for your Annual Wellness Visit if you have not done so already this year.  You may set up this appointment before you leave today or you may call back ([phone]) and schedule an appointment.  Please make sure when you call that you mention that you are scheduling your Annual Wellness Visit with the clinical pharmacist so that the appointment may be made for the proper length of time.     Continue current medications. Continue good therapeutic lifestyle changes which include good diet and exercise. Fall precautions discussed with patient. If an FOBT was given today- please return it to our front desk. If you are over 25 years old - you may need Prevnar 87 or the adult Pneumonia vaccine.  **Flu shots are available--- please call and schedule a FLU-CLINIC appointment**  After your visit with [place] today you will receive a survey in the mail or online from [employer] regarding your care with [place]. Please take a moment to fill this out. Your feedback is very  important to [place] as you can help [place] better understand your patient needs as well as improve your experience and satisfaction. WE CARE ABOUT YOU!!!   The flu shot you received today may make your arm sore Use commonsense do not put yourself and situations where you might hurt yourself or fall or injure yourself. Drink plenty of water and fluids and stay well-hydrated Check blood sugars regularly Avoid accidents as much as possible We will call with your lab work results as soon as these results become available

## 2018-01-26 LAB — CBC WITH DIFFERENTIAL/PLATELET
Basophils Absolute: 0.1 10*3/uL (ref 0.0–0.2)
Basos: 1 %
EOS (ABSOLUTE): 0.1 10*3/uL (ref 0.0–0.4)
EOS: 1 %
HEMATOCRIT: 40.4 % (ref 37.5–51.0)
HEMOGLOBIN: 13.5 g/dL (ref 13.0–17.7)
Immature Grans (Abs): 0.1 10*3/uL (ref 0.0–0.1)
Immature Granulocytes: 1 %
LYMPHS ABS: 1.4 10*3/uL (ref 0.7–3.1)
Lymphs: 11 %
MCH: 26.7 pg (ref 26.6–33.0)
MCHC: 33.4 g/dL (ref 31.5–35.7)
MCV: 80 fL (ref 79–97)
MONOCYTES: 7 %
MONOS ABS: 0.9 10*3/uL (ref 0.1–0.9)
NEUTROS ABS: 10.7 10*3/uL — AB (ref 1.4–7.0)
Neutrophils: 79 %
Platelets: 354 10*3/uL (ref 150–450)
RBC: 5.06 x10E6/uL (ref 4.14–5.80)
RDW: 13.6 % (ref 12.3–15.4)
WBC: 13.3 10*3/uL — AB (ref 3.4–10.8)

## 2018-01-26 LAB — BMP8+EGFR
BUN / CREAT RATIO: 14 (ref 10–24)
BUN: 11 mg/dL (ref 8–27)
CO2: 24 mmol/L (ref 20–29)
CREATININE: 0.79 mg/dL (ref 0.76–1.27)
Calcium: 10.1 mg/dL (ref 8.6–10.2)
Chloride: 101 mmol/L (ref 96–106)
GFR calc Af Amer: 100 mL/min/{1.73_m2} (ref >59)
GFR calc non Af Amer: 87 mL/min/{1.73_m2} (ref >59)
GLUCOSE: 108 mg/dL — AB (ref 65–99)
Potassium: 4.5 mmol/L (ref 3.5–5.2)
SODIUM: 142 mmol/L (ref 134–144)

## 2018-01-26 LAB — THYROID PANEL WITH TSH
Free Thyroxine Index: 2.4 (ref 1.2–4.9)
T3 UPTAKE RATIO: 28 % (ref 24–39)
T4, Total: 8.6 ug/dL (ref 4.5–12.0)
TSH: 1.43 u[IU]/mL (ref 0.450–4.500)

## 2018-01-26 LAB — HEPATIC FUNCTION PANEL
ALBUMIN: 4.3 g/dL (ref 3.5–4.8)
ALT: 17 IU/L (ref 0–44)
AST: 16 IU/L (ref 0–40)
Alkaline Phosphatase: 77 IU/L (ref 39–117)
BILIRUBIN TOTAL: 0.3 mg/dL (ref 0.0–1.2)
Bilirubin, Direct: 0.11 mg/dL (ref 0.00–0.40)
TOTAL PROTEIN: 6.6 g/dL (ref 6.0–8.5)

## 2018-01-26 LAB — LIPID PANEL
CHOL/HDL RATIO: 3 ratio (ref 0.0–5.0)
Cholesterol, Total: 124 mg/dL (ref 100–199)
HDL: 42 mg/dL (ref >39)
LDL CALC: 65 mg/dL (ref 0–99)
TRIGLYCERIDES: 84 mg/dL (ref 0–149)
VLDL Cholesterol Cal: 17 mg/dL (ref 5–40)

## 2018-01-26 LAB — URINE CULTURE

## 2018-01-26 LAB — VITAMIN D 25 HYDROXY (VIT D DEFICIENCY, FRACTURES): Vit D, 25-Hydroxy: 50.6 ng/mL (ref 30.0–100.0)

## 2018-02-23 DIAGNOSIS — E113292 Type 2 diabetes mellitus with mild nonproliferative diabetic retinopathy without macular edema, left eye: Secondary | ICD-10-CM | POA: Diagnosis not present

## 2018-02-23 DIAGNOSIS — H26493 Other secondary cataract, bilateral: Secondary | ICD-10-CM | POA: Diagnosis not present

## 2018-02-23 LAB — HM DIABETES EYE EXAM

## 2018-06-07 ENCOUNTER — Encounter: Payer: Self-pay | Admitting: Family Medicine

## 2018-06-07 ENCOUNTER — Ambulatory Visit: Payer: Medicare PPO | Admitting: Family Medicine

## 2018-06-07 VITALS — BP 123/77 | HR 74 | Temp 96.9°F | Ht 70.0 in | Wt 163.0 lb

## 2018-06-07 DIAGNOSIS — E782 Mixed hyperlipidemia: Secondary | ICD-10-CM | POA: Diagnosis not present

## 2018-06-07 DIAGNOSIS — Z8249 Family history of ischemic heart disease and other diseases of the circulatory system: Secondary | ICD-10-CM | POA: Diagnosis not present

## 2018-06-07 DIAGNOSIS — Z794 Long term (current) use of insulin: Secondary | ICD-10-CM

## 2018-06-07 DIAGNOSIS — I10 Essential (primary) hypertension: Secondary | ICD-10-CM

## 2018-06-07 DIAGNOSIS — Z87438 Personal history of other diseases of male genital organs: Secondary | ICD-10-CM

## 2018-06-07 DIAGNOSIS — E039 Hypothyroidism, unspecified: Secondary | ICD-10-CM

## 2018-06-07 DIAGNOSIS — R8290 Unspecified abnormal findings in urine: Secondary | ICD-10-CM

## 2018-06-07 DIAGNOSIS — E119 Type 2 diabetes mellitus without complications: Secondary | ICD-10-CM | POA: Diagnosis not present

## 2018-06-07 DIAGNOSIS — I739 Peripheral vascular disease, unspecified: Secondary | ICD-10-CM | POA: Diagnosis not present

## 2018-06-07 DIAGNOSIS — N400 Enlarged prostate without lower urinary tract symptoms: Secondary | ICD-10-CM | POA: Diagnosis not present

## 2018-06-07 DIAGNOSIS — E559 Vitamin D deficiency, unspecified: Secondary | ICD-10-CM | POA: Diagnosis not present

## 2018-06-07 LAB — URINALYSIS, COMPLETE
Bilirubin, UA: NEGATIVE
GLUCOSE, UA: NEGATIVE
NITRITE UA: NEGATIVE
Protein, UA: NEGATIVE
Specific Gravity, UA: 1.015 (ref 1.005–1.030)
UUROB: 0.2 mg/dL (ref 0.2–1.0)
pH, UA: 6 (ref 5.0–7.5)

## 2018-06-07 LAB — MICROSCOPIC EXAMINATION
EPITHELIAL CELLS (NON RENAL): NONE SEEN /HPF (ref 0–10)
Renal Epithel, UA: NONE SEEN /hpf
WBC, UA: >30 /hpf — AB (ref 0–5)

## 2018-06-07 LAB — BAYER DCA HB A1C WAIVED: HB A1C: 9 % — AB (ref <7.0)

## 2018-06-07 NOTE — Addendum Note (Signed)
Addended by: Zannie Cove on: 06/07/2018 11:15 AM   Modules accepted: Orders

## 2018-06-07 NOTE — Patient Instructions (Addendum)
Medicare Annual Wellness Visit  [facility] and the medical providers at [facility] strive to bring you the best medical care.  In doing so we not only want to address your current medical conditions and concerns but also to detect new conditions early and prevent illness, disease and health-related problems.    Medicare offers a yearly Wellness Visit which allows our clinical staff to assess your need for preventative services including immunizations, lifestyle education, counseling to decrease risk of preventable diseases and screening for fall risk and other medical concerns.    This visit is provided free of charge (no copay) for all Medicare recipients. The clinical pharmacists at [facility] have begun to conduct these Wellness Visits which will also include a thorough review of all your medications.    As you primary medical provider recommend that you make an appointment for your Annual Wellness Visit if you have not done so already this year.  You may set up this appointment before you leave today or you may call back [phone]) and schedule an appointment.  Please make sure when you call that you mention that you are scheduling your Annual Wellness Visit with the clinical pharmacist so that the appointment may be made for the proper length of time.    Continue current medications. Continue good therapeutic lifestyle changes which include good diet and exercise. Fall precautions discussed with patient. If an FOBT was given today- please return it to our front desk. If you are over 49 years old - you may need Prevnar 109 or the adult Pneumonia vaccine.  **Flu shots are available--- please call and schedule a FLU-CLINIC appointment**  After your visit with [place] today you will receive a survey in the mail or online from [employer] regarding your care with [place]. Please take a moment to fill this out. Your feedback is very  important to [place] as you can help [place] better understand your patient needs as well as improve your experience and satisfaction. WE CARE ABOUT YOU!!!   Follow-up with ophthalmologist as planned If you get another colonoscopy this would be due and 2022. Continue to walk and exercise regularly especially during the winter months. Watch diet closely and avoid sodium intake. You can purchase Pepcid AC over-the-counter since she cannot take Zantac anymore as it has been removed. Keep the Pepcid [facility] handy and if you plan to eat a spicy meal take a Pepcid AC prior to eating that meal. Continue to walk regularly and do not climb if possible Continue vitamin D 3 and take at least 600 mg of calcium twice daily along with the vitamin D3. See podiatrist for toenail trimming

## 2018-06-07 NOTE — Progress Notes (Signed)
Subjective:    Patient ID: [name] [name], male    DOB: 06-10-1940, 78 y.o.   MRN: [id number]  HPI Pt here for follow up and management of chronic medical problems which includes diabetes, hypertension and hyperlipidemia. He is taking medication regularly.  She is doing well today with no specific complaints.  He does bring in blood sugars for review and blood pressures for review.  The blood sugars seem to run higher in the morning and lower during the day.  Around 150 in the morning and through the day anywhere from the 80s up to 130.  Outside blood pressure was 138/79.  Patient has history of basal cell carcinoma diabetes GERD hyperlipidemia kidney stones and thyroid disease.  Body mass index is good at 23.39.  Patient is pleasant and alert and relates his history well.  He sees the eye doctor about every 6 months because of recent cataracts and of course he also has diabetes.  Today the patient denies any chest pain pressure tightness or shortness of breath with activity.  He denies any trouble with swallowing other than occasional heartburn or indigestion with eating spicy foods.  He is now taking some Tums after the fact.  We did discuss with him that since he cannot take Zantac anymore that maybe he should try some Pepcid AC prior to eating the spicy meals.  He denies any blood in the stool black tarry bowel movements or change in bowel habits.  He is passing his water well.  His BMI is 23.39.   Patient Active Problem List   Diagnosis Date Noted  . Diabetic retinopathy ([place]) 05/22/2017  . Peripheral vascular insufficiency ([place]) 03/18/2016  . Family history of heart disease 03/18/2016  . Right inguinal hernia 12/13/2013  . Essential hypertension 12/13/2013  . BPH (benign prostatic hyperplasia) 12/13/2013  . Hypothyroidism 08/07/2013  . Vitamin D deficiency 04/02/2013  . Goiter, nontoxic, multinodular   . Kidney stone   . Hyperlipidemia 01/15/2009  . ABNORMAL ELECTROCARDIOGRAM 01/15/2009    . Type 2 diabetes mellitus treated with insulin ([place]) 01/09/2009  . HIATAL HERNIA 01/09/2009  . BASAL CELL CARCINOMA, HX OF 01/09/2009  . DIVERTICULITIS, HX OF 01/09/2009   Outpatient Encounter Medications as of 06/07/2018  Medication Sig  . aspirin 81 MG tablet Take 81 mg by mouth daily.    . Cholecalciferol (VITAMIN D3) 5000 UNITS CAPS Take 5,000 Units by mouth. Take 1 capsule three times per week  . CVS CINNAMON PO Take 2,000 mg by mouth daily.    . fluticasone (FLONASE) 50 MCG/ACT nasal spray One to 2 sprays each nostril at bedtime  . Garlic 2248 MG CAPS Take by mouth daily.    Marland Kitchen glimepiride (AMARYL) 2 MG tablet Take 1 tablet (2 mg total) by mouth daily before breakfast.  . glucose blood test strip Check BS TID and [id number]  . Insulin Detemir (LEVEMIR FLEXTOUCH) 100 UNIT/ML Pen Inject up to 50 units once daily  . levothyroxine (SYNTHROID, LEVOTHROID) 75 MCG tablet Take 1 tablet (75 mcg total) by mouth daily.  . metFORMIN (GLUCOPHAGE) 1000 MG tablet Take 1 tablet (1,000 mg total) by mouth 2 (two) times daily with a meal.  . quinapril (ACCUPRIL) 20 MG tablet Take 1 tablet (20 mg total) by mouth at bedtime.   No facility-administered encounter medications on file as of 06/07/2018.       Review of Systems  Constitutional: Negative.   HENT: Negative.   Eyes: Negative.   Respiratory: Negative.  Cardiovascular: Negative.   Gastrointestinal: Negative.   Endocrine: Negative.   Genitourinary: Negative.   Musculoskeletal: Negative.   Skin: Negative.   Allergic/Immunologic: Negative.   Neurological: Negative.   Hematological: Negative.   Psychiatric/Behavioral: Negative.        Objective:   Physical Exam Vitals signs and nursing note reviewed.  Constitutional:      General: He is not in acute distress.    Appearance: Normal appearance. He is well-developed and normal weight. He is not ill-appearing.     Comments: The patient is pleasant and alert.  HENT:     Head:  Normocephalic and atraumatic.     Right Ear: Tympanic membrane, ear canal and external ear normal. There is no impacted cerumen.     Left Ear: Tympanic membrane, ear canal and external ear normal. There is no impacted cerumen.     Nose: Nose normal. No congestion.     Mouth/Throat:     Mouth: Mucous membranes are moist.     Pharynx: Oropharynx is clear. No oropharyngeal exudate.  Eyes:     General: No scleral icterus.       Right eye: No discharge.        Left eye: No discharge.     Extraocular Movements: Extraocular movements intact.     Conjunctiva/sclera: Conjunctivae normal.     Pupils: Pupils are equal, round, and reactive to light.  Neck:     Musculoskeletal: Normal range of motion and neck supple.     Thyroid: No thyromegaly.     Vascular: No carotid bruit.     Trachea: No tracheal deviation.  Cardiovascular:     Rate and Rhythm: Normal rate and regular rhythm.     Heart sounds: Normal heart sounds. No murmur. No friction rub. No gallop.      Comments: Pulses were slightly decreased in the right lower extremity compared to the left. Pulmonary:     Effort: Pulmonary effort is normal. No respiratory distress.     Breath sounds: Normal breath sounds. No wheezing or rales.     Comments: Lungs are clear anteriorly and posteriorly with no axillary adenopathy chest wall masses or tenderness Chest:     Chest wall: No tenderness.  Abdominal:     General: Bowel sounds are normal.     Palpations: Abdomen is soft. There is no mass.     Tenderness: There is no abdominal tenderness. There is no guarding or rebound.  Genitourinary:    Penis: Normal.      Scrotum/Testes: Normal.     Rectum: Normal.     Comments: The prostate is slightly enlarged but soft and smooth.  There were no rectal masses.  External genitalia were within normal limits with no inguinal adenopathy and a small right inguinal hernia was noted on the exam today. Musculoskeletal: Normal range of motion.        General:  Deformity present. No tenderness.     Right lower leg: No edema.     Left lower leg: No edema.     Comments: Thoracic kyphosis  Lymphadenopathy:     Cervical: No cervical adenopathy.  Skin:    General: Skin is warm and dry.     Findings: No rash.  Neurological:     General: No focal deficit present.     Mental Status: He is alert and oriented to person, place, and time. Mental status is at baseline.     Cranial Nerves: No cranial nerve deficit.  Sensory: No sensory deficit.     Gait: Gait normal.     Deep Tendon Reflexes: Reflexes are normal and symmetric. Reflexes normal.  Psychiatric:        Mood and Affect: Mood normal.        Behavior: Behavior normal.        Thought Content: Thought content normal.        Judgment: Judgment normal.     Comments: Mood affect and behavior were all normal for this patient.    BP 123/77 (BP Location: Left Arm)   Pulse 74   Temp (!) 96.9 F (36.1 C) (Oral)   Ht '5\' 10"'  (1.778 m)   Wt 163 lb (73.9 kg)   BMI 23.39 kg/m         Assessment & Plan:  1. Type 2 diabetes mellitus treated with insulin (HCC) -Continue to monitor blood sugars regularly and keep feet checked and get eye exams -Walk and exercise regularly - BMP8+EGFR - CBC with Differential/Platelet - Bayer DCA Hb A1c Waived  2. Hyperlipidemia -Continue with current treatment pending results of lab work - CBC with Differential/Platelet - Lipid panel  3. Essential hypertension -Blood pressure is good today and he will continue with current treatment - BMP8+EGFR - CBC with Differential/Platelet - Hepatic function panel  4. Family history of heart disease -Continue with risk factor management which includes sodium restriction good blood sugar control and weight restriction and regular exercise. - CBC with Differential/Platelet - Lipid panel  5. Vitamin D deficiency -Continue with vitamin D replacement pending results of lab work - CBC with Differential/Platelet -  VITAMIN D 25 Hydroxy (Vit-D Deficiency, Fractures)  6. Hypothyroidism, unspecified type -Continue with current treatment pending results of lab work - CBC with Differential/Platelet  7. History of BPH -No complaints today with voiding.  Prostate remains enlarged slightly with no lumps or masses. - Urinalysis, Complete - CBC with Differential/Platelet - PSA, total and free  8. Peripheral vascular insufficiency (HCC) -No claudication symptoms but diminished pulses right foot.  Patient Instructions                       Medicare Annual Wellness Visit  [facility] and the medical providers at [facility] strive to bring you the best medical care.  In doing so we not only want to address your current medical conditions and concerns but also to detect new conditions early and prevent illness, disease and health-related problems.    Medicare offers a yearly Wellness Visit which allows our clinical staff to assess your need for preventative services including immunizations, lifestyle education, counseling to decrease risk of preventable diseases and screening for fall risk and other medical concerns.    This visit is provided free of charge (no copay) for all Medicare recipients. The clinical pharmacists at [facility] have begun to conduct these Wellness Visits which will also include a thorough review of all your medications.    As you primary medical provider recommend that you make an appointment for your Annual Wellness Visit if you have not done so already this year.  You may set up this appointment before you leave today or you may call back ([phone]) and schedule an appointment.  Please make sure when you call that you mention that you are scheduling your Annual Wellness Visit with the clinical pharmacist so that the appointment may be made for the proper length of time.    Continue current medications.  Continue good therapeutic lifestyle  changes which include good diet and exercise. Fall precautions discussed with patient. If an FOBT was given today- please return it to our front desk. If you are over 46 years old - you may need Prevnar 55 or the adult Pneumonia vaccine.  **Flu shots are available--- please call and schedule a FLU-CLINIC appointment**  After your visit with [place] today you will receive a survey in the mail or online from [employer] regarding your care with [place]. Please take a moment to fill this out. Your feedback is very important to [place] as you can help [place] better understand your patient needs as well as improve your experience and satisfaction. WE CARE ABOUT YOU!!!   Follow-up with ophthalmologist as planned If you get another colonoscopy this would be due and 2022. Continue to walk and exercise regularly especially during the winter months. Watch diet closely and avoid sodium intake. You can purchase Pepcid AC over-the-counter since she cannot take Zantac anymore as it has been removed. Keep the Pepcid [facility] handy and if you plan to eat a spicy meal take a Pepcid AC prior to eating that meal. Continue to walk regularly and do not climb if possible Continue vitamin D 3 and take at least 600 mg of calcium twice daily along with the vitamin D3. See podiatrist for toenail trimming  Arrie Senate MD

## 2018-06-08 LAB — CBC WITH DIFFERENTIAL/PLATELET
Basophils Absolute: 0.1 10*3/uL (ref 0.0–0.2)
Basos: 1 %
EOS (ABSOLUTE): 0.1 10*3/uL (ref 0.0–0.4)
Eos: 1 %
HEMATOCRIT: 44.7 % (ref 37.5–51.0)
Hemoglobin: 14.8 g/dL (ref 13.0–17.7)
IMMATURE GRANS (ABS): 0 10*3/uL (ref 0.0–0.1)
IMMATURE GRANULOCYTES: 1 %
Lymphocytes Absolute: 1.6 10*3/uL (ref 0.7–3.1)
Lymphs: 21 %
MCH: 27.3 pg (ref 26.6–33.0)
MCHC: 33.1 g/dL (ref 31.5–35.7)
MCV: 82 fL (ref 79–97)
MONOS ABS: 0.8 10*3/uL (ref 0.1–0.9)
Monocytes: 10 %
NEUTROS PCT: 66 %
Neutrophils Absolute: 5 10*3/uL (ref 1.4–7.0)
Platelets: 280 10*3/uL (ref 150–450)
RBC: 5.43 x10E6/uL (ref 4.14–5.80)
RDW: 12.8 % (ref 11.6–15.4)
WBC: 7.6 10*3/uL (ref 3.4–10.8)

## 2018-06-08 LAB — BMP8+EGFR
BUN/Creatinine Ratio: 16 (ref 10–24)
BUN: 13 mg/dL (ref 8–27)
CHLORIDE: 100 mmol/L (ref 96–106)
CO2: 25 mmol/L (ref 20–29)
Calcium: 9.5 mg/dL (ref 8.6–10.2)
Creatinine, Ser: 0.8 mg/dL (ref 0.76–1.27)
GFR calc Af Amer: 100 mL/min/{1.73_m2} (ref >59)
GFR calc non Af Amer: 86 mL/min/{1.73_m2} (ref >59)
GLUCOSE: 151 mg/dL — AB (ref 65–99)
Potassium: 4.4 mmol/L (ref 3.5–5.2)
SODIUM: 137 mmol/L (ref 134–144)

## 2018-06-08 LAB — VITAMIN D 25 HYDROXY (VIT D DEFICIENCY, FRACTURES): Vit D, 25-Hydroxy: 45.5 ng/mL (ref 30.0–100.0)

## 2018-06-08 LAB — LIPID PANEL
CHOL/HDL RATIO: 2.8 ratio (ref 0.0–5.0)
Cholesterol, Total: 113 mg/dL (ref 100–199)
HDL: 41 mg/dL (ref >39)
LDL CALC: 60 mg/dL (ref 0–99)
TRIGLYCERIDES: 62 mg/dL (ref 0–149)
VLDL CHOLESTEROL CAL: 12 mg/dL (ref 5–40)

## 2018-06-08 LAB — PSA, TOTAL AND FREE
PROSTATE SPECIFIC AG, SERUM: 3.2 ng/mL (ref 0.0–4.0)
PSA, Free Pct: 19.1 %
PSA, Free: 0.61 ng/mL

## 2018-06-08 LAB — HEPATIC FUNCTION PANEL
ALT: 15 IU/L (ref 0–44)
AST: 13 IU/L (ref 0–40)
Albumin: 4.3 g/dL (ref 3.7–4.7)
Alkaline Phosphatase: 72 IU/L (ref 39–117)
BILIRUBIN TOTAL: 0.4 mg/dL (ref 0.0–1.2)
BILIRUBIN, DIRECT: 0.11 mg/dL (ref 0.00–0.40)
Total Protein: 6.8 g/dL (ref 6.0–8.5)

## 2018-06-09 LAB — URINE CULTURE

## 2018-06-12 ENCOUNTER — Other Ambulatory Visit: Payer: Self-pay | Admitting: *Deleted

## 2018-06-12 MED ORDER — CEFDINIR 300 MG PO CAPS: 300.0000 mg | ORAL_CAPSULE | Freq: Two times a day (BID) | ORAL | 0 refills | Status: DC

## 2018-06-12 NOTE — Progress Notes (Signed)
[name] [name]

## 2018-07-06 ENCOUNTER — Encounter: Payer: Medicare PPO | Admitting: *Deleted

## 2018-08-08 ENCOUNTER — Telehealth: Payer: Self-pay | Admitting: Family Medicine

## 2018-08-17 ENCOUNTER — Other Ambulatory Visit: Payer: Self-pay

## 2018-08-17 ENCOUNTER — Inpatient Hospital Stay: Payer: Self-pay

## 2018-08-17 ENCOUNTER — Inpatient Hospital Stay (HOSPITAL_COMMUNITY)
Admission: EM | Admit: 2018-08-17 | Discharge: 2018-08-30 | DRG: 246 | Disposition: A | Discharge status: Rehabilitation Facility | Payer: Medicare PPO | Attending: Internal Medicine | Admitting: Internal Medicine

## 2018-08-17 ENCOUNTER — Emergency Department (HOSPITAL_COMMUNITY): Payer: Medicare PPO

## 2018-08-17 ENCOUNTER — Encounter (HOSPITAL_COMMUNITY): Payer: Self-pay | Admitting: Cardiology

## 2018-08-17 DIAGNOSIS — R0902 Hypoxemia: Secondary | ICD-10-CM

## 2018-08-17 DIAGNOSIS — R402312 Coma scale, best motor response, none, at arrival to emergency department: Secondary | ICD-10-CM | POA: Diagnosis not present

## 2018-08-17 DIAGNOSIS — G931 Anoxic brain damage, not elsewhere classified: Secondary | ICD-10-CM | POA: Diagnosis not present

## 2018-08-17 DIAGNOSIS — S0083XA Contusion of other part of head, initial encounter: Secondary | ICD-10-CM | POA: Diagnosis not present

## 2018-08-17 DIAGNOSIS — D72829 Elevated white blood cell count, unspecified: Secondary | ICD-10-CM | POA: Diagnosis not present

## 2018-08-17 DIAGNOSIS — G9340 Encephalopathy, unspecified: Secondary | ICD-10-CM | POA: Diagnosis not present

## 2018-08-17 DIAGNOSIS — R402112 Coma scale, eyes open, never, at arrival to emergency department: Secondary | ICD-10-CM | POA: Diagnosis present

## 2018-08-17 DIAGNOSIS — R579 Shock, unspecified: Secondary | ICD-10-CM | POA: Diagnosis not present

## 2018-08-17 DIAGNOSIS — R5381 Other malaise: Secondary | ICD-10-CM | POA: Diagnosis not present

## 2018-08-17 DIAGNOSIS — I499 Cardiac arrhythmia, unspecified: Secondary | ICD-10-CM | POA: Diagnosis not present

## 2018-08-17 DIAGNOSIS — I469 Cardiac arrest, cause unspecified: Secondary | ICD-10-CM | POA: Diagnosis not present

## 2018-08-17 DIAGNOSIS — I110 Hypertensive heart disease with heart failure: Secondary | ICD-10-CM | POA: Diagnosis not present

## 2018-08-17 DIAGNOSIS — R402212 Coma scale, best verbal response, none, at arrival to emergency department: Secondary | ICD-10-CM | POA: Diagnosis present

## 2018-08-17 DIAGNOSIS — Z789 Other specified health status: Secondary | ICD-10-CM | POA: Diagnosis not present

## 2018-08-17 DIAGNOSIS — G9341 Metabolic encephalopathy: Secondary | ICD-10-CM | POA: Diagnosis not present

## 2018-08-17 DIAGNOSIS — Z20828 Contact with and (suspected) exposure to other viral communicable diseases: Secondary | ICD-10-CM | POA: Diagnosis present

## 2018-08-17 DIAGNOSIS — Z9861 Coronary angioplasty status: Secondary | ICD-10-CM | POA: Diagnosis not present

## 2018-08-17 DIAGNOSIS — I2510 Atherosclerotic heart disease of native coronary artery without angina pectoris: Secondary | ICD-10-CM

## 2018-08-17 DIAGNOSIS — R569 Unspecified convulsions: Secondary | ICD-10-CM | POA: Diagnosis not present

## 2018-08-17 DIAGNOSIS — N179 Acute kidney failure, unspecified: Secondary | ICD-10-CM | POA: Diagnosis not present

## 2018-08-17 DIAGNOSIS — J690 Pneumonitis due to inhalation of food and vomit: Secondary | ICD-10-CM | POA: Diagnosis not present

## 2018-08-17 DIAGNOSIS — Z794 Long term (current) use of insulin: Secondary | ICD-10-CM | POA: Diagnosis not present

## 2018-08-17 DIAGNOSIS — I462 Cardiac arrest due to underlying cardiac condition: Secondary | ICD-10-CM | POA: Diagnosis present

## 2018-08-17 DIAGNOSIS — Z7984 Long term (current) use of oral hypoglycemic drugs: Secondary | ICD-10-CM | POA: Diagnosis not present

## 2018-08-17 DIAGNOSIS — R0602 Shortness of breath: Secondary | ICD-10-CM

## 2018-08-17 DIAGNOSIS — A419 Sepsis, unspecified organism: Secondary | ICD-10-CM | POA: Diagnosis not present

## 2018-08-17 DIAGNOSIS — R404 Transient alteration of awareness: Secondary | ICD-10-CM | POA: Diagnosis not present

## 2018-08-17 DIAGNOSIS — Y92481 Parking lot as the place of occurrence of the external cause: Secondary | ICD-10-CM | POA: Diagnosis not present

## 2018-08-17 DIAGNOSIS — J9690 Respiratory failure, unspecified, unspecified whether with hypoxia or hypercapnia: Secondary | ICD-10-CM | POA: Diagnosis not present

## 2018-08-17 DIAGNOSIS — J9811 Atelectasis: Secondary | ICD-10-CM | POA: Diagnosis not present

## 2018-08-17 DIAGNOSIS — Z978 Presence of other specified devices: Secondary | ICD-10-CM

## 2018-08-17 DIAGNOSIS — I4891 Unspecified atrial fibrillation: Secondary | ICD-10-CM

## 2018-08-17 DIAGNOSIS — J189 Pneumonia, unspecified organism: Secondary | ICD-10-CM | POA: Diagnosis not present

## 2018-08-17 DIAGNOSIS — I5033 Acute on chronic diastolic (congestive) heart failure: Secondary | ICD-10-CM | POA: Diagnosis not present

## 2018-08-17 DIAGNOSIS — I482 Chronic atrial fibrillation, unspecified: Secondary | ICD-10-CM | POA: Diagnosis present

## 2018-08-17 DIAGNOSIS — E039 Hypothyroidism, unspecified: Secondary | ICD-10-CM | POA: Diagnosis not present

## 2018-08-17 DIAGNOSIS — Z955 Presence of coronary angioplasty implant and graft: Secondary | ICD-10-CM

## 2018-08-17 DIAGNOSIS — I4901 Ventricular fibrillation: Principal | ICD-10-CM | POA: Diagnosis present

## 2018-08-17 DIAGNOSIS — I214 Non-ST elevation (NSTEMI) myocardial infarction: Secondary | ICD-10-CM

## 2018-08-17 DIAGNOSIS — R1310 Dysphagia, unspecified: Secondary | ICD-10-CM | POA: Diagnosis not present

## 2018-08-17 DIAGNOSIS — R6521 Severe sepsis with septic shock: Secondary | ICD-10-CM | POA: Diagnosis not present

## 2018-08-17 DIAGNOSIS — I4510 Unspecified right bundle-branch block: Secondary | ICD-10-CM | POA: Diagnosis not present

## 2018-08-17 DIAGNOSIS — I5031 Acute diastolic (congestive) heart failure: Secondary | ICD-10-CM | POA: Diagnosis not present

## 2018-08-17 DIAGNOSIS — I480 Paroxysmal atrial fibrillation: Secondary | ICD-10-CM | POA: Diagnosis not present

## 2018-08-17 DIAGNOSIS — Z4682 Encounter for fitting and adjustment of non-vascular catheter: Secondary | ICD-10-CM | POA: Diagnosis not present

## 2018-08-17 DIAGNOSIS — E119 Type 2 diabetes mellitus without complications: Secondary | ICD-10-CM

## 2018-08-17 DIAGNOSIS — R7401 Elevation of levels of liver transaminase levels: Secondary | ICD-10-CM

## 2018-08-17 DIAGNOSIS — E785 Hyperlipidemia, unspecified: Secondary | ICD-10-CM | POA: Diagnosis not present

## 2018-08-17 DIAGNOSIS — E1165 Type 2 diabetes mellitus with hyperglycemia: Secondary | ICD-10-CM | POA: Diagnosis present

## 2018-08-17 DIAGNOSIS — J9601 Acute respiratory failure with hypoxia: Secondary | ICD-10-CM | POA: Diagnosis not present

## 2018-08-17 DIAGNOSIS — E876 Hypokalemia: Secondary | ICD-10-CM | POA: Diagnosis not present

## 2018-08-17 DIAGNOSIS — I10 Essential (primary) hypertension: Secondary | ICD-10-CM | POA: Diagnosis present

## 2018-08-17 DIAGNOSIS — J90 Pleural effusion, not elsewhere classified: Secondary | ICD-10-CM | POA: Diagnosis not present

## 2018-08-17 DIAGNOSIS — W1830XA Fall on same level, unspecified, initial encounter: Secondary | ICD-10-CM | POA: Diagnosis present

## 2018-08-17 DIAGNOSIS — S069X9A Unspecified intracranial injury with loss of consciousness of unspecified duration, initial encounter: Secondary | ICD-10-CM | POA: Diagnosis not present

## 2018-08-17 DIAGNOSIS — I472 Ventricular tachycardia: Secondary | ICD-10-CM | POA: Diagnosis not present

## 2018-08-17 DIAGNOSIS — R4020 Unspecified coma: Secondary | ICD-10-CM | POA: Diagnosis not present

## 2018-08-17 DIAGNOSIS — E872 Acidosis: Secondary | ICD-10-CM | POA: Diagnosis not present

## 2018-08-17 DIAGNOSIS — Z9911 Dependence on respirator [ventilator] status: Secondary | ICD-10-CM | POA: Diagnosis not present

## 2018-08-17 DIAGNOSIS — E1169 Type 2 diabetes mellitus with other specified complication: Secondary | ICD-10-CM | POA: Diagnosis not present

## 2018-08-17 DIAGNOSIS — Z4659 Encounter for fitting and adjustment of other gastrointestinal appliance and device: Secondary | ICD-10-CM

## 2018-08-17 DIAGNOSIS — Z87891 Personal history of nicotine dependence: Secondary | ICD-10-CM | POA: Diagnosis not present

## 2018-08-17 DIAGNOSIS — J156 Pneumonia due to other aerobic Gram-negative bacteria: Secondary | ICD-10-CM | POA: Diagnosis not present

## 2018-08-17 HISTORY — DX: Type 2 diabetes mellitus without complications: E11.9

## 2018-08-17 LAB — CBC
HCT: 39.2 % (ref 39.0–52.0)
HCT: 44.1 % (ref 39.0–52.0)
Hemoglobin: 11.9 g/dL — ABNORMAL LOW (ref 13.0–17.0)
Hemoglobin: 13.7 g/dL (ref 13.0–17.0)
MCH: 26.9 pg (ref 26.0–34.0)
MCH: 27.1 pg (ref 26.0–34.0)
MCHC: 30.4 g/dL (ref 30.0–36.0)
MCHC: 31.1 g/dL (ref 30.0–36.0)
MCV: 88.7 fL (ref 80.0–100.0)
MCV: 87.2 fL (ref 80.0–100.0)
Platelets: 308 10*3/uL (ref 150–400)
Platelets: 309 10*3/uL (ref 150–400)
RBC: 4.42 MIL/uL (ref 4.22–5.81)
RBC: 5.06 MIL/uL (ref 4.22–5.81)
RDW: 13.5 % (ref 11.5–15.5)
RDW: 13.5 % (ref 11.5–15.5)
WBC: 18 10*3/uL — ABNORMAL HIGH (ref 4.0–10.5)
WBC: 27 10*3/uL — ABNORMAL HIGH (ref 4.0–10.5)
nRBC: 0 % (ref 0.0–0.2)
nRBC: 0 % (ref 0.0–0.2)

## 2018-08-17 LAB — LACTIC ACID, PLASMA: Lactic Acid, Venous: 6.7 mmol/L (ref 0.5–1.9)

## 2018-08-17 LAB — CBG MONITORING, ED: Glucose-Capillary: 168 mg/dL — ABNORMAL HIGH (ref 70–99)

## 2018-08-17 LAB — BASIC METABOLIC PANEL
Anion gap: 17 — ABNORMAL HIGH (ref 5–15)
Anion gap: 13 (ref 5–15)
Anion gap: 11 (ref 5–15)
BUN: 11 mg/dL (ref 8–23)
BUN: 12 mg/dL (ref 8–23)
BUN: 12 mg/dL (ref 8–23)
CO2: 18 mmol/L — ABNORMAL LOW (ref 22–32)
CO2: 19 mmol/L — ABNORMAL LOW (ref 22–32)
CO2: 19 mmol/L — ABNORMAL LOW (ref 22–32)
Calcium: 9.2 mg/dL (ref 8.9–10.3)
Calcium: 8.7 mg/dL — ABNORMAL LOW (ref 8.9–10.3)
Calcium: 8.3 mg/dL — ABNORMAL LOW (ref 8.9–10.3)
Chloride: 106 mmol/L (ref 98–111)
Chloride: 107 mmol/L (ref 98–111)
Chloride: 107 mmol/L (ref 98–111)
Creatinine, Ser: 0.98 mg/dL (ref 0.61–1.24)
Creatinine, Ser: 0.79 mg/dL (ref 0.61–1.24)
Creatinine, Ser: 0.59 mg/dL — ABNORMAL LOW (ref 0.61–1.24)
GFR calc Af Amer: >60 mL/min (ref >60)
GFR calc Af Amer: >60 mL/min (ref >60)
GFR calc Af Amer: >60 mL/min (ref >60)
GFR calc non Af Amer: >60 mL/min (ref >60)
GFR calc non Af Amer: >60 mL/min (ref >60)
GFR calc non Af Amer: >60 mL/min (ref >60)
Glucose, Bld: 207 mg/dL — ABNORMAL HIGH (ref 70–99)
Glucose, Bld: 229 mg/dL — ABNORMAL HIGH (ref 70–99)
Glucose, Bld: 257 mg/dL — ABNORMAL HIGH (ref 70–99)
Potassium: 3.6 mmol/L (ref 3.5–5.1)
Potassium: 3.8 mmol/L (ref 3.5–5.1)
Potassium: 4.3 mmol/L (ref 3.5–5.1)
Sodium: 141 mmol/L (ref 135–145)
Sodium: 139 mmol/L (ref 135–145)
Sodium: 137 mmol/L (ref 135–145)

## 2018-08-17 LAB — COMPREHENSIVE METABOLIC PANEL
ALT: 257 U/L — ABNORMAL HIGH (ref 0–44)
AST: 336 U/L — ABNORMAL HIGH (ref 15–41)
Albumin: 3.2 g/dL — ABNORMAL LOW (ref 3.5–5.0)
Alkaline Phosphatase: 72 U/L (ref 38–126)
Anion gap: 20 — ABNORMAL HIGH (ref 5–15)
BUN: 9 mg/dL (ref 8–23)
CO2: 15 mmol/L — ABNORMAL LOW (ref 22–32)
Calcium: 8.7 mg/dL — ABNORMAL LOW (ref 8.9–10.3)
Chloride: 106 mmol/L (ref 98–111)
Creatinine, Ser: 1.32 mg/dL — ABNORMAL HIGH (ref 0.61–1.24)
GFR calc Af Amer: 60 mL/min — ABNORMAL LOW (ref >60)
GFR calc non Af Amer: 52 mL/min — ABNORMAL LOW (ref >60)
Glucose, Bld: 216 mg/dL — ABNORMAL HIGH (ref 70–99)
Potassium: 3.4 mmol/L — ABNORMAL LOW (ref 3.5–5.1)
Sodium: 141 mmol/L (ref 135–145)
Total Bilirubin: 0.6 mg/dL (ref 0.3–1.2)
Total Protein: 5.8 g/dL — ABNORMAL LOW (ref 6.5–8.1)

## 2018-08-17 LAB — MRSA PCR SCREENING: MRSA by PCR: NEGATIVE

## 2018-08-17 LAB — GLUCOSE, CAPILLARY
Glucose-Capillary: 190 mg/dL — ABNORMAL HIGH (ref 70–99)
Glucose-Capillary: 198 mg/dL — ABNORMAL HIGH (ref 70–99)
Glucose-Capillary: 277 mg/dL — ABNORMAL HIGH (ref 70–99)

## 2018-08-17 LAB — POCT I-STAT 7, (LYTES, BLD GAS, ICA,H+H)
Acid-base deficit: 7 mmol/L — ABNORMAL HIGH (ref 0.0–2.0)
Acid-base deficit: 6 mmol/L — ABNORMAL HIGH (ref 0.0–2.0)
Acid-base deficit: 4 mmol/L — ABNORMAL HIGH (ref 0.0–2.0)
Bicarbonate: 18.3 mmol/L — ABNORMAL LOW (ref 20.0–28.0)
Bicarbonate: 20.2 mmol/L (ref 20.0–28.0)
Bicarbonate: 20.6 mmol/L (ref 20.0–28.0)
Calcium, Ion: 1.22 mmol/L (ref 1.15–1.40)
Calcium, Ion: 1.22 mmol/L (ref 1.15–1.40)
Calcium, Ion: 1.08 mmol/L — ABNORMAL LOW (ref 1.15–1.40)
HCT: 39 % (ref 39.0–52.0)
HCT: 38 % — ABNORMAL LOW (ref 39.0–52.0)
HCT: 38 % — ABNORMAL LOW (ref 39.0–52.0)
Hemoglobin: 13.3 g/dL (ref 13.0–17.0)
Hemoglobin: 12.9 g/dL — ABNORMAL LOW (ref 13.0–17.0)
Hemoglobin: 12.9 g/dL — ABNORMAL LOW (ref 13.0–17.0)
O2 Saturation: 90 %
O2 Saturation: 99 %
O2 Saturation: 100 %
Patient temperature: 96.9
Patient temperature: 96.6
Patient temperature: 33
Potassium: 3.5 mmol/L (ref 3.5–5.1)
Potassium: 3.7 mmol/L (ref 3.5–5.1)
Potassium: 3.9 mmol/L (ref 3.5–5.1)
Sodium: 139 mmol/L (ref 135–145)
Sodium: 139 mmol/L (ref 135–145)
Sodium: 140 mmol/L (ref 135–145)
TCO2: 19 mmol/L — ABNORMAL LOW (ref 22–32)
TCO2: 21 mmol/L — ABNORMAL LOW (ref 22–32)
TCO2: 22 mmol/L (ref 22–32)
pCO2 arterial: 35.7 mmHg (ref 32.0–48.0)
pCO2 arterial: 40 mmHg (ref 32.0–48.0)
pCO2 arterial: 30.5 mmHg — ABNORMAL LOW (ref 32.0–48.0)
pH, Arterial: 7.313 — ABNORMAL LOW (ref 7.350–7.450)
pH, Arterial: 7.306 — ABNORMAL LOW (ref 7.350–7.450)
pH, Arterial: 7.42 (ref 7.350–7.450)
pO2, Arterial: 60 mmHg — ABNORMAL LOW (ref 83.0–108.0)
pO2, Arterial: 127 mmHg — ABNORMAL HIGH (ref 83.0–108.0)
pO2, Arterial: 268 mmHg — ABNORMAL HIGH (ref 83.0–108.0)

## 2018-08-17 LAB — APTT
aPTT: 27 seconds (ref 24–36)
aPTT: 29 seconds (ref 24–36)

## 2018-08-17 LAB — POCT I-STAT 4, (NA,K, GLUC, HGB,HCT)
Glucose, Bld: 222 mg/dL — ABNORMAL HIGH (ref 70–99)
HCT: 39 % (ref 39.0–52.0)
Hemoglobin: 13.3 g/dL (ref 13.0–17.0)
Potassium: 3.9 mmol/L (ref 3.5–5.1)
Sodium: 140 mmol/L (ref 135–145)

## 2018-08-17 LAB — TROPONIN I
Troponin I: 0.03 ng/mL (ref <0.03)
Troponin I: 0.26 ng/mL (ref <0.03)
Troponin I: 1.28 ng/mL (ref <0.03)

## 2018-08-17 LAB — MAGNESIUM: Magnesium: 1.7 mg/dL (ref 1.7–2.4)

## 2018-08-17 LAB — PROTIME-INR
INR: 1.2 (ref 0.8–1.2)
INR: 1.1 (ref 0.8–1.2)
Prothrombin Time: 14.6 seconds (ref 11.4–15.2)
Prothrombin Time: 14.2 seconds (ref 11.4–15.2)

## 2018-08-17 LAB — SARS CORONAVIRUS 2 BY RT PCR (HOSPITAL ORDER, PERFORMED IN ~~LOC~~ HOSPITAL LAB): SARS Coronavirus 2: NEGATIVE

## 2018-08-17 MED ORDER — ASPIRIN 300 MG RE SUPP: 300.0000 mg | RECTAL | Status: DC

## 2018-08-17 MED ORDER — SODIUM CHLORIDE 0.9 % IV SOLN
1.0000 g | INTRAVENOUS | Status: DC
  Filled 2018-08-17: qty 10

## 2018-08-17 MED ORDER — AMIODARONE HCL IN DEXTROSE 360-4.14 MG/200ML-% IV SOLN
30.0000 mg/h | INTRAVENOUS | Status: DC
  Administered 2018-08-17: 23:00:00 30 mg/h via INTRAVENOUS

## 2018-08-17 MED ORDER — SODIUM CHLORIDE 0.9 % IV SOLN
1.0000 ug/kg/min | INTRAVENOUS | Status: DC
  Administered 2018-08-17: 1 ug/kg/min via INTRAVENOUS
  Filled 2018-08-17: qty 20

## 2018-08-17 MED ORDER — FENTANYL 2500MCG IN NS 250ML (10MCG/ML) PREMIX INFUSION
100.0000 ug/h | INTRAVENOUS | Status: DC
  Administered 2018-08-17: 100 ug/h via INTRAVENOUS
  Administered 2018-08-17: 19:00:00 50 ug/h via INTRAVENOUS
  Administered 2018-08-18: 125 ug/h via INTRAVENOUS
  Administered 2018-08-18: 200 ug/h via INTRAVENOUS
  Administered 2018-08-19: 250 ug/h via INTRAVENOUS
  Administered 2018-08-19: 06:00:00 125 ug/h via INTRAVENOUS
  Administered 2018-08-20: 100 ug/h via INTRAVENOUS
  Administered 2018-08-20: 300 ug/h via INTRAVENOUS
  Filled 2018-08-17 (×7): qty 250

## 2018-08-17 MED ORDER — FENTANYL CITRATE (PF) 100 MCG/2ML IJ SOLN: 25.0000 ug | INTRAMUSCULAR | Status: DC | PRN

## 2018-08-17 MED ORDER — FENTANYL CITRATE (PF) 100 MCG/2ML IJ SOLN: 50.0000 ug | Freq: Once | INTRAMUSCULAR | Status: DC

## 2018-08-17 MED ORDER — FENTANYL CITRATE (PF) 100 MCG/2ML IJ SOLN
50.0000 ug | INTRAMUSCULAR | Status: DC | PRN
  Administered 2018-08-19: 50 ug via INTRAVENOUS

## 2018-08-17 MED ORDER — FENTANYL 2500MCG IN NS 250ML (10MCG/ML) PREMIX INFUSION: 0.0000 ug/h | INTRAVENOUS | Status: DC

## 2018-08-17 MED ORDER — SODIUM CHLORIDE 0.9 % IV SOLN: 1.0000 g | Freq: Once | INTRAVENOUS | Status: DC

## 2018-08-17 MED ORDER — SODIUM CHLORIDE 0.9 % IV SOLN
INTRAVENOUS | Status: DC | PRN
  Administered 2018-08-17 – 2018-08-19 (×3): 250 mL via INTRAVENOUS

## 2018-08-17 MED ORDER — PANTOPRAZOLE SODIUM 40 MG IV SOLR
40.0000 mg | Freq: Every day | INTRAVENOUS | Status: DC
  Administered 2018-08-17 – 2018-08-23 (×7): 40 mg via INTRAVENOUS
  Filled 2018-08-17 (×7): qty 40

## 2018-08-17 MED ORDER — CISATRACURIUM BOLUS VIA INFUSION
0.1000 mg/kg | Freq: Once | INTRAVENOUS | Status: DC
  Filled 2018-08-17: qty 10

## 2018-08-17 MED ORDER — CHLORHEXIDINE GLUCONATE 0.12% ORAL RINSE (MEDLINE KIT)
15.0000 mL | Freq: Two times a day (BID) | OROMUCOSAL | Status: DC
  Administered 2018-08-17 – 2018-08-23 (×12): 15 mL via OROMUCOSAL

## 2018-08-17 MED ORDER — AMIODARONE HCL IN DEXTROSE 360-4.14 MG/200ML-% IV SOLN
60.0000 mg/h | INTRAVENOUS | Status: AC
  Filled 2018-08-17: qty 200

## 2018-08-17 MED ORDER — FENTANYL CITRATE (PF) 100 MCG/2ML IJ SOLN
INTRAMUSCULAR | Status: AC
  Filled 2018-08-17: qty 2

## 2018-08-17 MED ORDER — SODIUM CHLORIDE 0.9 % IV SOLN
500.0000 mg | Freq: Once | INTRAVENOUS | Status: AC
  Administered 2018-08-17: 500 mg via INTRAVENOUS
  Filled 2018-08-17: qty 500

## 2018-08-17 MED ORDER — ARTIFICIAL TEARS OPHTHALMIC OINT
1.0000 "application " | TOPICAL_OINTMENT | Freq: Three times a day (TID) | OPHTHALMIC | Status: DC
  Administered 2018-08-17 – 2018-08-18 (×2): 1 via OPHTHALMIC
  Filled 2018-08-17 (×2): qty 3.5

## 2018-08-17 MED ORDER — ASPIRIN 300 MG RE SUPP: 150.0000 mg | Freq: Every day | RECTAL | Status: DC

## 2018-08-17 MED ORDER — ASPIRIN EC 81 MG PO TBEC: 81.0000 mg | DELAYED_RELEASE_TABLET | Freq: Every day | ORAL | Status: DC

## 2018-08-17 MED ORDER — PROPOFOL 1000 MG/100ML IV EMUL
INTRAVENOUS | Status: AC
  Filled 2018-08-17: qty 100

## 2018-08-17 MED ORDER — LACTATED RINGERS IV SOLN: INTRAVENOUS | Status: DC

## 2018-08-17 MED ORDER — PROPOFOL 1000 MG/100ML IV EMUL
25.0000 ug/kg/min | INTRAVENOUS | Status: DC
  Administered 2018-08-17: 50 ug/kg/min via INTRAVENOUS
  Administered 2018-08-17 – 2018-08-18 (×2): 40 ug/kg/min via INTRAVENOUS
  Administered 2018-08-18: 60 ug/kg/min via INTRAVENOUS
  Filled 2018-08-17 (×3): qty 100

## 2018-08-17 MED ORDER — SODIUM CHLORIDE 0.9 % IV SOLN: 500.0000 mg | Freq: Once | INTRAVENOUS | Status: DC

## 2018-08-17 MED ORDER — FENTANYL BOLUS VIA INFUSION
25.0000 ug | INTRAVENOUS | Status: DC | PRN
  Administered 2018-08-19: 25 ug via INTRAVENOUS
  Administered 2018-08-21: 17:00:00 100 ug via INTRAVENOUS
  Filled 2018-08-17: qty 25

## 2018-08-17 MED ORDER — FENTANYL CITRATE (PF) 100 MCG/2ML IJ SOLN
50.0000 ug | INTRAMUSCULAR | Status: AC | PRN
  Administered 2018-08-17 (×3): 50 ug via INTRAVENOUS

## 2018-08-17 MED ORDER — FENTANYL 2500MCG IN NS 250ML (10MCG/ML) PREMIX INFUSION
25.0000 ug/h | INTRAVENOUS | Status: DC
  Administered 2018-08-17: 25 ug/h via INTRAVENOUS
  Filled 2018-08-17: qty 250

## 2018-08-17 MED ORDER — ORAL CARE MOUTH RINSE
15.0000 mL | OROMUCOSAL | Status: DC
  Administered 2018-08-17 – 2018-08-23 (×54): 15 mL via OROMUCOSAL

## 2018-08-17 MED ORDER — NOREPINEPHRINE 4 MG/250ML-% IV SOLN
0.0000 ug/min | INTRAVENOUS | Status: DC
  Filled 2018-08-17: qty 250

## 2018-08-17 MED ORDER — ENOXAPARIN SODIUM 40 MG/0.4ML ~~LOC~~ SOLN: 40.0000 mg | SUBCUTANEOUS | Status: DC

## 2018-08-17 MED ORDER — SODIUM CHLORIDE 0.9 % IV BOLUS
250.0000 mL | Freq: Once | INTRAVENOUS | Status: AC
  Administered 2018-08-17: 16:00:00 250 mL via INTRAVENOUS

## 2018-08-17 MED ORDER — SODIUM CHLORIDE 0.9 % IV SOLN
500.0000 mg | INTRAVENOUS | Status: DC
  Filled 2018-08-17: qty 500

## 2018-08-17 MED ORDER — POTASSIUM CHLORIDE CRYS ER 20 MEQ PO TBCR
20.0000 meq | EXTENDED_RELEASE_TABLET | Freq: Once | ORAL | Status: AC
  Administered 2018-08-17: 21:00:00 20 meq via ORAL
  Filled 2018-08-17: qty 1

## 2018-08-17 MED ORDER — CISATRACURIUM BOLUS VIA INFUSION
0.0500 mg/kg | INTRAVENOUS | Status: DC | PRN
  Filled 2018-08-17: qty 5

## 2018-08-17 MED ORDER — IPRATROPIUM-ALBUTEROL 0.5-2.5 (3) MG/3ML IN SOLN: 3.0000 mL | Freq: Four times a day (QID) | RESPIRATORY_TRACT | Status: DC | PRN

## 2018-08-17 MED ORDER — FENTANYL CITRATE (PF) 100 MCG/2ML IJ SOLN
25.0000 ug | INTRAMUSCULAR | Status: AC | PRN
  Administered 2018-08-20 (×3): 25 ug via INTRAVENOUS

## 2018-08-17 MED ORDER — SODIUM CHLORIDE 0.9 % IV SOLN
1.0000 g | Freq: Once | INTRAVENOUS | Status: AC
  Administered 2018-08-17: 22:00:00 1 g via INTRAVENOUS
  Filled 2018-08-17: qty 10

## 2018-08-17 MED ORDER — HEPARIN SODIUM (PORCINE) 5000 UNIT/ML IJ SOLN
5000.0000 [IU] | Freq: Three times a day (TID) | INTRAMUSCULAR | Status: DC
  Administered 2018-08-17 – 2018-08-22 (×14): 5000 [IU] via SUBCUTANEOUS
  Filled 2018-08-17 (×14): qty 1

## 2018-08-17 MED ORDER — AMIODARONE LOAD VIA INFUSION
150.0000 mg | Freq: Once | INTRAVENOUS | Status: DC
  Filled 2018-08-17: qty 83.34

## 2018-08-17 MED ORDER — SODIUM CHLORIDE 0.9 % IV SOLN
INTRAVENOUS | Status: DC
  Administered 2018-08-17: 18:00:00 100 mL via INTRAVENOUS
  Administered 2018-08-18: 02:00:00 via INTRAVENOUS

## 2018-08-17 MED ORDER — SODIUM CHLORIDE 0.9 % IV SOLN
INTRAVENOUS | Status: DC | PRN
  Administered 2018-08-18: 12:00:00 via INTRA_ARTERIAL

## 2018-08-17 MED ORDER — PROPOFOL 1000 MG/100ML IV EMUL
5.0000 ug/kg/min | INTRAVENOUS | Status: DC
  Administered 2018-08-17: 5 ug/kg/min via INTRAVENOUS

## 2018-08-17 MED ORDER — INSULIN REGULAR(HUMAN) IN NACL 100-0.9 UT/100ML-% IV SOLN
INTRAVENOUS | Status: DC
  Administered 2018-08-17: 22:00:00 1.3 [IU]/h via INTRAVENOUS
  Filled 2018-08-17: qty 100

## 2018-08-17 MED ORDER — FENTANYL CITRATE (PF) 100 MCG/2ML IJ SOLN
INTRAMUSCULAR | Status: AC
  Administered 2018-08-17: 50 ug via INTRAVENOUS
  Filled 2018-08-17: qty 2

## 2018-08-17 NOTE — Progress Notes (Signed)
Responded to page to support patient Son and Wife. Patient experienced CPR and is going to ICU.  Family is being escorted by nurse to bedside to visit with pt  Briefly. Provided emotional and spiritual support to family and staff.  Will follow as needed.  Jaclynn Major, [place], [facility] Pager [phone]

## 2018-08-17 NOTE — ED Notes (Signed)
This RN spoke with [facility]M charge RN, [place] charge RN, pt placement and Critical Care MD. Pt is going to be held in the ED until covid test results. If negative pt will go to [facility]. 2H RN is coming down right now to assist with pad placement and code cool process. If pt is covid negative critical care will change bed order from high risk so patient can get a bed on 2H. This RN will notified pt placement and critical care MD when test results.

## 2018-08-17 NOTE — ED Notes (Signed)
Pt CBG was 168, notified Brooke(RN)

## 2018-08-17 NOTE — Code Documentation (Signed)
Ice packs applied to groin and axillae.

## 2018-08-17 NOTE — ED Notes (Signed)
0.03 trop called to this RN, provider notified.

## 2018-08-17 NOTE — Progress Notes (Signed)
Patient transported on vent to CT and returned to ED without complications.

## 2018-08-17 NOTE — Progress Notes (Signed)
Caryl Pina, RN aware that PICC will be placed 08-18-18.  Patient has adequate IV access at this time.  Carolee Rota, RN VAST

## 2018-08-17 NOTE — Code Documentation (Signed)
2 Heart RN at bedside applying code cool pads. Art line also being placed at this time.

## 2018-08-17 NOTE — Progress Notes (Signed)
[facility] [place] Progress Note Patient Name: ELDRIGE PITKIN DOB: Aug 26, 1940 MRN: [id number]   Date of Service  08/17/2018  HPI/Events of Note  Patient with some ectopy while on TTM. K+ 3.9. no recent Mg2+, also hyperglycemic, needs central line access.  eICU Interventions  Kcl 20 meq via NGT x 1, check Mg2+, PICC line ordered, insulin infusion orderd        Kerry Kass Perfecto Purdy 08/17/2018, 8:59 PM

## 2018-08-17 NOTE — ED Provider Notes (Signed)
[facility] EMERGENCY DEPARTMENT Provider Note   CSN: [id number] Arrival date & time: 08/17/18  1520    History   Chief Complaint Chief Complaint  Patient presents with   Post CPR    HPI [name] [name] is a 78 y.o. male.      Cardiac Arrest  Witnessed by:  Bystander Incident location: [employer]. Time since incident:  15 minutes Time before BLS initiated:  Immediate Time before ALS initiated:  3-5 minutes Condition upon EMS arrival:  Unresponsive Pulse:  Absent Initial cardiac rhythm per EMS:  Ventricular fibrillation Treatments prior to arrival:  Intubation Rhythm on admission to ED:  Sinus tachycardia Associated symptoms: fatigue    Patient had witnessed arrest at [place] parking lot.  Immediate bystander CPR initiated.  ACLS crew arrived within 5 minutes and found to be in Vfib.  Total of 2 shocks given with 1 of epinephrine and ROSC obtained after approximately 11 total minutes CPR time.  Unresponsive in ED.  7.0 ETT in field. Patient complaining of fatigue and possibly low glucose per bystander report.  Hit his head on the ground when he fell.  Has contusion on forehead per EMS.  CBG 120.   No past medical history on file.  There are no active problems to display for this patient.     Home Medications    Prior to Admission medications   Not on File    Family History No family history on file.  Social History Social History   Tobacco Use   Smoking status: Not on file  Substance Use Topics   Alcohol use: Not on file   Drug use: Not on file     Allergies   Patient has no allergy information on record.   Review of Systems Review of Systems  Unable to perform ROS: Intubated  Constitutional: Positive for fatigue.     Physical Exam Updated Vital Signs There were no vitals taken for this visit.  Physical Exam Vitals signs and nursing note reviewed. Exam conducted with a chaperone present.  Constitutional:    Appearance: He is well-developed.  HENT:     Head: Normocephalic.     Comments: Contusion on forehead     Right Ear: External ear normal.     Left Ear: External ear normal.     Nose: No congestion or rhinorrhea.     Mouth/Throat:     Pharynx: No oropharyngeal exudate or posterior oropharyngeal erythema.  Eyes:     Extraocular Movements: Extraocular movements intact.     Conjunctiva/sclera: Conjunctivae normal.     Pupils: Pupils are equal, round, and reactive to light.  Cardiovascular:     Rate and Rhythm: Regular rhythm. Tachycardia present.     Heart sounds: No murmur.     Comments: Occasional PVC on monitor Intact DP, PT, Radial pulses bilaterally - equal Pulmonary:     Effort: No respiratory distress.     Breath sounds: Normal breath sounds. No rhonchi.     Comments: Breathing at a rate of 40 times per minute.  Bilateral breath sounds present. Abdominal:     Palpations: Abdomen is soft. There is no mass.     Tenderness: There is no abdominal tenderness.  Musculoskeletal:        General: No deformity or signs of injury.     Right lower leg: No edema.     Left lower leg: No edema.  Skin:    General: Skin is warm and dry.  Findings: Lesion present.  Neurological:     Mental Status: He is alert.     Comments: GCS 3.  Not responding to painful stimuli.  Corneal reflexes intact.  Unable to appreciate gag reflex.      ED Treatments / Results  Labs (all labs ordered are listed, but only abnormal results are displayed) Labs Reviewed - No data to display  EKG None  Radiology No results found.  Procedures .Critical Care Performed by: Andee Poles, MD Authorized by: Andee Poles, MD   Critical care provider statement:    Critical care time (minutes):  45   Critical care time was exclusive of:  Separately billable procedures and treating other patients and teaching time   Critical care was necessary to treat or prevent imminent or life-threatening  deterioration of the following conditions:  Cardiac failure, circulatory failure, CNS failure or compromise and shock   Critical care was time spent personally by me on the following activities:  Blood draw for specimens, discussions with consultants, development of treatment plan with patient or surrogate, evaluation of patient's response to treatment, examination of patient, interpretation of cardiac output measurements, ventilator management, review of old charts, re-evaluation of patient's condition, pulse oximetry, ordering and review of radiographic studies, ordering and review of laboratory studies and ordering and performing treatments and interventions   (including critical care time)  Medications Ordered in ED Medications - No data to display   Initial Impression / Assessment and Plan / ED Course  I have reviewed the triage vital signs and the nursing notes.  Pertinent labs & imaging results that were available during my care of the patient were reviewed by me and considered in my medical decision making (see chart for details).        78 year old man with no known history presents to the emergency department as a postarrest with the above details.  Patient currently GCS 3 and intubated.  Bilateral breath sounds with good pulses bilaterally on arrival.  ABCs intact.  Sinus tachycardia with occasional PVCs.  Multiple EKGs do not show any acute ST elevation or depression at this time to suggest ACS as the specific cause for his V. fib arrest.  Do not know about his past medical history and I am unable to reach his wife at this time.  Ice packs placed around the patient at this time for possible cooling after call with intensive care.  Cardiology has been consulted who will see the patient.  Chest x-ray shows possible bilateral infiltrates which could be pulmonary contusions or represent infection.  Basic lab work shows lactic acidosis, transaminitis, AKI.  Currently weaning ventilator  settings to tolerable FiO2 levels.  However, patient with a low PaO2 on an ABG that was drawn when weaning from 100% FiO2 to 60% FiO2 approximately 10 minutes after down titrating so the patient was placed back on 100% FiO2.  COVID testing is pending at this time though by history it does not sound that the patient has had any fevers or other infectious signs or symptoms.  We have empirically cover the patient with community-acquired antibiotics of ceftriaxone and azithromycin given that the patient has had difficulty oxygenating and has a leukocytosis with bilateral pulmonary infiltrates.  While this still could represent post CPR findings of shock liver, AKI and pulmonary contusions, sepsis is still in the differential.  Therefore blood cultures were drawn.  Patient placed on ice packs with a temperature sensing Foley and had propofol and fentanyl infusions for sedation.  CT  scan of the head and neck did not reveal any acute fractures or intracranial abnormalities.  He does have a contusion on his forehead that will heal on its own.  Family has been updated by myself. Final Clinical Impressions(s) / ED Diagnoses   Final diagnoses:  Endotracheally intubated  Transaminitis  AKI (acute kidney injury) ([place])  Hypoxia  Cardiac arrest ([place])  Contusion of forehead, initial encounter    ED Discharge Orders    None       Andee Poles, MD 08/17/18 Romualdo Bolk, MD 08/18/18 [phone]

## 2018-08-17 NOTE — ED Triage Notes (Signed)
Patient in via [place] as post-cpr - patient had witnessed arrest in [place] parking lot. Per EMS, bystanders reported that patient appeared to be choking just prior. Bystanders performed chest compressions immediately (call received at 1427), EMS on scene at 1430 and took over cpr - patient noted to be v-fib - given 1mg  epi (1436) and shocked twice (260J at 1434 and at 1437) and ROSC achieved at 1442. Patient did hit his head - abrasion noted to forehead, bleeding controlled, pupils equal and reactive.   EMS VS: HR 120 ST, BP 113/82, etCO2 23, CBG 120. GCS 3. 16g. PIV RAC. Patient was intubated PTA, though spontaneously breathing at 26 RR upon arrival to ED. Received 1563ml NS PTA.

## 2018-08-17 NOTE — H&P (Addendum)
NAME:  [name] [name], MRN:  [id number], DOB:  09/05/1940, LOS: 0 ADMISSION DATE:  08/17/2018, CONSULTATION DATE:  08/17/2018 REFERRING MD: ED provider, CHIEF COMPLAINT: Post cardiac arrest  Brief History   Middle-age gentleman with background history of diabetes Was feeling well up until today He did go to the store where he had a witnessed collapse, bystander noted complaints of fatigue and concern about his sugar He was attempting to eat a snack before he had his episode of collapse CPR initiated immediately ACLS crew arrived within 5 minutes Total of 11 minutes prior to [place] Required 2 shocks for V. Fib Comatose on presentation to the emergency department  History of present illness   Denies any complaints of fever, cough, shortness of breath prior to leaving for the store  Past Medical History  Diabetes-according to family well-controlled  Significant Hospital Events     Consults:  cardiology  Procedures:    Significant Diagnostic Tests:  CT head IMPRESSION: 1. No acute intracranial pathology. Small-vessel white matter disease.  2.  No fracture or static subluxation of the cervical spine.  Chest x-ray reviewed by myself showing mild atelectasis, possible left lower lobe infiltrate  EKG did not reveal any ST T wave changes Micro Data:    Antimicrobials:  Azithromycin 08/17/2018>> Ceftriaxone 08/17/2018>> Interim history/subjective:  Comatose in the emergency department  Objective   Blood pressure 121/79, pulse (!) 118, temperature (!) 96.9 F (36.1 C), resp. rate 20, height 5\' 11"  (1.803 m), weight 90.7 kg, SpO2 99 %.    Vent Mode: PRVC FiO2 (%):  [100 %] 100 % Set Rate:  [20 bmp] 20 bmp Vt Set:  [600 mL] 600 mL PEEP:  [5 cmH20] 5 cmH20   Intake/Output Summary (Last 24 hours) at 08/17/2018 1651 Last data filed at 08/17/2018 1617 Gross per 24 hour  Intake 250 ml  Output 0 ml  Net 250 ml   Filed Weights   08/17/18 1532  Weight: 90.7 kg     Examination: General: Elderly gentleman, does not appear to be in distress HENT: Moist oral mucosa, endotracheal tube in place Lungs: Decreased air entry at the bases, few rales at the bases Cardiovascular: S1-S2 appreciated Abdomen: Soft, bowel sounds appreciated Extremities: No edema, no clubbing Neuro: Sedate GU:   Resolved Hospital Problem list     Assessment & Plan:  Post cardiac arrest V. fib arrest -No underlying history of heart disease known to family -was only taking medicines for Diabetes -Achieved ROSC after about 11 minutes -Will benefit from hypothermia protocol  Acute hypoxemic respiratory failure -Continue ventilator support -PRN bronchodilators -Follow arterial blood gases  Possible respiratory infection -Leukocytosis/atelectasis on chest x-ray, though was not complaining of any significant complaints prior to today -We will continue Rocephin and azithromycin for possible community-acquired pneumonia  Possibility of aspiration being the trigger for his collapse -Monitor radiological changes -Continue current antibiotics  Appreciate input from cardiology  Head trauma with scalp injury -CT head shows no significant findings  Pharmacy was able to pull up his medications and he was on inhalers-fluticasone, quinapril, metformin  Best practice:  Diet: N.p.o. Pain/Anxiety/Delirium protocol (if indicated): Fentanyl VAP protocol (if indicated): In place DVT prophylaxis: Heparin GI prophylaxis: Protonix Glucose control: SSI Mobility: Bedrest Code Status: Full code Family Communication: Discussed with spouse and son Disposition: Intensive care unit  Labs   CBC: Recent Labs  Lab 08/17/18 1532  WBC 18.0*  HGB 11.9*  HCT 39.2  MCV 88.7  PLT 308  Basic Metabolic Panel: Recent Labs  Lab 08/17/18 1532  NA 141  K 3.4*  CL 106  CO2 15*  GLUCOSE 216*  BUN 9  CREATININE 1.32*  CALCIUM 8.7*   GFR: Estimated Creatinine Clearance: 54 mL/min  (A) (by C-G formula based on SCr of 1.32 mg/dL (H)). Recent Labs  Lab 08/17/18 1532  WBC 18.0*    Liver Function Tests: Recent Labs  Lab 08/17/18 1532  AST 336*  ALT 257*  ALKPHOS 72  BILITOT 0.6  PROT 5.8*  ALBUMIN 3.2*   No results for input(s): LIPASE, AMYLASE in the last 168 hours. No results for input(s): AMMONIA in the last 168 hours.  ABG No results found for: PHART, PCO2ART, PO2ART, HCO3, TCO2, ACIDBASEDEF, O2SAT   Coagulation Profile: Recent Labs  Lab 08/17/18 1532  INR 1.2    Cardiac Enzymes: Recent Labs  Lab 08/17/18 1532  TROPONINI 0.03*    HbA1C: No results found for: HGBA1C  CBG: No results for input(s): GLUCAP in the last 168 hours.  Review of Systems:   Unobtainable  Past Medical History  He,  has no past medical history on file.   Surgical History   He did smoke over 40 years ago Worked heating and cooling for many years Usually very active with no significant limitations  Social History      Family History   His Family history is unknown by patient.   Allergies Allergies not on file   Home Medications  Prior to Admission medications   Not on File     Critical care time: 30 minutes of critical care time spent evaluating patient, reviewing records, formulating plan of care

## 2018-08-17 NOTE — ED Notes (Signed)
Nurse Navigator: Wife with son Marlou Sa presented to bedside with permission. Admitting provider has spoken with family pertaining to plan of care. Phone numbers for contact have been obtained and updated in the chart. The patients belongings were given to family.

## 2018-08-17 NOTE — Procedures (Signed)
Arterial Catheter Insertion Procedure Note [name] [id number] 1940-08-07  Procedure: Insertion of Arterial Catheter  Indications: Blood pressure monitoring and Frequent blood sampling  Procedure Details Consent: Unable to obtain consent because of altered level of consciousness. Time Out: Verified patient identification, verified procedure, site/side was marked, verified correct patient position, special equipment/implants available, medications/allergies/relevent history reviewed, required imaging and test results available.  Performed  Maximum sterile technique was used including antiseptics, cap, gloves, gown, hand hygiene, mask and sheet. Skin prep: Chlorhexidine; local anesthetic administered 20 gauge catheter was inserted into right radial artery using the Seldinger technique. ULTRASOUND GUIDANCE USED: NO Evaluation Blood flow good; BP tracing good. Complications: No apparent complications.   [name] [name] 08/17/2018

## 2018-08-17 NOTE — Consult Note (Addendum)
Cardiology Consultation:   Patient ID: [name] [name] MRN: [id number]; DOB: 06/09/1940  Admit date: 08/17/2018 Date of Consult: 08/17/2018  Primary Care Provider: Chipper Herb, MD Primary Cardiologist: New Primary Electrophysiologist:  None    Patient Profile:   [name] [name] is a 78 y.o. male with DM2 but little is known about the rest of his medical history who is being seen today for the evaluation out of hospital Vfib arrest at the request of Dr. Guillermina City, Emergency Medicine.   History of Present Illness:   [name] is a 78 y.o male with h/o DM2 No other PMH or family history on file. Pt unresponsive in the ED. Per EMS and bystander report, pt had complained of fatigue and possibly low blood glucose and was trying to eat a muffin and may has aspirated then had witnessed arrest at a [place] parking lot. Pt apparently hit head on ground when he collapsed. Immediate bystander CPR was initiatied and 911 called. Upon EMS arrival, he was found to be in Vfib and was given 1 round of Epi and 2 shocks. ROSC was achieved after ~11 min of CPR. Pt was intubated in the field and transferred to [facility] ED. Initial EKG showed Afib w/ RVR in the 130s without frank ischemic changes. CBG 120. CT brain - no acute process. CBC shows elevated WBC ct at 18. Hgb 11.9. K 3.4 CR 1.3 . Troponin 0.03. Pt being tested for possible COVID-19. PCCM to admit and manage vent. Cardiology consulted for assistance w/ w/u.    PMHx: 1. Diabetes   Home Medications:  Prior to Admission medications   Not on File    Inpatient Medications: Scheduled Meds:  aspirin  300 mg Rectal NOW   Continuous Infusions:  propofol (DIPRIVAN) infusion 15 mcg/kg/min (08/17/18 1552)   PRN Meds: fentaNYL (SUBLIMAZE) injection, fentaNYL (SUBLIMAZE) injection, fentaNYL (SUBLIMAZE) injection, fentaNYL (SUBLIMAZE) injection  Allergies:   Allergies not on file  Social History:  Unable to obtain given arrest  Social History    Socioeconomic History   Marital status: Married    Spouse name: Not on file   Number of children: Not on file   Years of education: Not on file   Highest education level: Not on file  Occupational History   Not on file  Social Needs   Financial resource strain: Not on file   Food insecurity:    Worry: Not on file    Inability: Not on file   Transportation needs:    Medical: Not on file    Non-medical: Not on file  Tobacco Use   Smoking status: Not on file  Substance and Sexual Activity   Alcohol use: Not on file   Drug use: Not on file   Sexual activity: Not on file  Lifestyle   Physical activity:    Days per week: Not on file    Minutes per session: Not on file   Stress: Not on file  Relationships   Social connections:    Talks on phone: Not on file    Gets together: Not on file    Attends religious service: Not on file    Active member of club or organization: Not on file    Attends meetings of clubs or organizations: Not on file    Relationship status: Not on file   Intimate partner violence:    Fear of current or ex partner: Not on file    Emotionally abused: Not on file    Physically  abused: Not on file    Forced sexual activity: Not on file  Other Topics Concern   Not on file  Social History Narrative   Not on file    Family History:   Family History  Family history unknown: Yes  Unable to obtain. No records. unresponsive in the ED   ROS:  Unable to obtain. No records. unresponsive in the ED   Physical Exam/Data:   Vitals:   08/17/18 1539 08/17/18 1545 08/17/18 1557 08/17/18 1606  BP: (!) 191/108 (!) 155/100 (!) 161/91 111/77  Pulse: (!) 146 (!) 136 (!) 132 (!) 120  Resp: (!) 30 (!) 24 (!) 22 (!) 22  Temp:  (!) 96.6 F (35.9 C) (!) 96.9 F (36.1 C) (!) 97.3 F (36.3 C)  TempSrc:      SpO2: 100% 100% 100% 99%  Weight:      Height:        Intake/Output Summary (Last 24 hours) at 08/17/2018 1609 Last data filed at  08/17/2018 1547 Gross per 24 hour  Intake --  Output 0 ml  Net 0 ml   Last 3 Weights 08/17/2018  Weight (lbs) 200 lb  Weight (kg) 90.719 kg     Body mass index is 27.89 kg/m.  General:  Intubated/sedated  HEENT: normal + ETT + hematoma on forehead Neck: supple. no JVD. Carotids 2+ bilat; no bruits. No lymphadenopathy or thryomegaly appreciated. Cor: PMI nondisplaced. Tachy reg  No rubs, gallops or murmurs. Lungs: clear Abdomen: + cooling pad soft, nontender, nondistended. No hepatosplenomegaly. No bruits or masses. Good bowel sounds. Extremities: no cyanosis, clubbing, rash, edema Neuro: intubated sedated    EKG:  The EKG was personally reviewed and demonstrates:  Atrial fibrillation w/ RVR 130s No ischemic changes -> Sinus tach    Relevant CV Studies: None   Laboratory Data:  ChemistryNo results for input(s): NA, K, CL, CO2, GLUCOSE, BUN, CREATININE, CALCIUM, GFRNONAA, GFRAA, ANIONGAP in the last 168 hours.  No results for input(s): PROT, ALBUMIN, AST, ALT, ALKPHOS, BILITOT in the last 168 hours. Hematology Recent Labs  Lab 08/17/18 1532  WBC 18.0*  RBC 4.42  HGB 11.9*  HCT 39.2  MCV 88.7  MCH 26.9  MCHC 30.4  RDW 13.5  PLT 308   Cardiac EnzymesNo results for input(s): TROPONINI in the last 168 hours. No results for input(s): TROPIPOC in the last 168 hours.  BNPNo results for input(s): BNP, PROBNP in the last 168 hours.  DDimer No results for input(s): DDIMER in the last 168 hours.  Radiology/Studies:  No results found.  Assessment and Plan:   [name] [name] is a 78 y.o. male with h/o DM2 (remainde of medical Hx unknown), who is being seen today for the evaluation out of hospital Vfib arrest at the request of Dr. Guillermina City, Emergency Medicine.   1. VFib Arrest: out of hospital VFib arrest w/ ROSC after 11 min of CPR + 1 round of Epi and 2 shocks. ED EKG shows afib w/ RVR. Pt intubated and unresponsive. IV Amiodarone ordered. Labs pending. Will need to check K,  Mg, troponin x 3 and obtain 2D echo. Monitor on tele for recurrent arrhythmias. Post arrest care/ vent management per PCCM. - by history it sounds like low BP and possible aspiration may have triggered but initial rhythm was VF and not PEA thus suspect possibly ischemic verus primary VF in setting of undiagnosed underlying cardiomyopathy  - first troponin negative and ecg without ischemic changes. Per bystander/EMS report no mention of  preceding CP - Continue supportive care and therapeutic hypothermia  - Keep K> 4.0, Mag > 2.0 - check echo - trend troponin - cath if/when recovers  2. AF with RVR - very transient. Now back in SR.  - with fall and head trauma would not start heparin at this point - can start amio if recurs - check echo   CRITICAL CARE Performed by: Glori Bickers  Total critical care time: 35 minutes  Critical care time was exclusive of separately billable procedures and treating other patients.  Critical care was necessary to treat or prevent imminent or life-threatening deterioration.  Critical care was time spent personally by me (independent of midlevel providers or residents) on the following activities: development of treatment plan with patient and/or surrogate as well as nursing, discussions with consultants, evaluation of patient's response to treatment, examination of patient, obtaining history from patient or surrogate, ordering and performing treatments and interventions, ordering and review of laboratory studies, ordering and review of radiographic studies, pulse oximetry and re-evaluation of patient's condition.    For questions or updates, please contact [place] Please consult [email or web address] for contact info under    Signed, Glori Bickers, MD  6:40 PM

## 2018-08-17 NOTE — ED Notes (Signed)
RN with patient to CT at this time.

## 2018-08-18 ENCOUNTER — Inpatient Hospital Stay (HOSPITAL_COMMUNITY): Payer: Medicare PPO

## 2018-08-18 DIAGNOSIS — D72829 Elevated white blood cell count, unspecified: Secondary | ICD-10-CM

## 2018-08-18 DIAGNOSIS — R579 Shock, unspecified: Secondary | ICD-10-CM

## 2018-08-18 DIAGNOSIS — I469 Cardiac arrest, cause unspecified: Secondary | ICD-10-CM

## 2018-08-18 DIAGNOSIS — I4901 Ventricular fibrillation: Principal | ICD-10-CM

## 2018-08-18 DIAGNOSIS — Z9911 Dependence on respirator [ventilator] status: Secondary | ICD-10-CM

## 2018-08-18 LAB — CBC
HCT: 38.4 % — ABNORMAL LOW (ref 39.0–52.0)
HCT: 37.2 % — ABNORMAL LOW (ref 39.0–52.0)
Hemoglobin: 12.3 g/dL — ABNORMAL LOW (ref 13.0–17.0)
Hemoglobin: 12.1 g/dL — ABNORMAL LOW (ref 13.0–17.0)
MCH: 27 pg (ref 26.0–34.0)
MCH: 27.2 pg (ref 26.0–34.0)
MCHC: 32 g/dL (ref 30.0–36.0)
MCHC: 32.5 g/dL (ref 30.0–36.0)
MCV: 84.2 fL (ref 80.0–100.0)
MCV: 83.6 fL (ref 80.0–100.0)
Platelets: 261 10*3/uL (ref 150–400)
Platelets: 248 10*3/uL (ref 150–400)
RBC: 4.56 MIL/uL (ref 4.22–5.81)
RBC: 4.45 MIL/uL (ref 4.22–5.81)
RDW: 13.7 % (ref 11.5–15.5)
RDW: 13.8 % (ref 11.5–15.5)
WBC: 17.5 10*3/uL — ABNORMAL HIGH (ref 4.0–10.5)
WBC: 14.3 10*3/uL — ABNORMAL HIGH (ref 4.0–10.5)
nRBC: 0 % (ref 0.0–0.2)
nRBC: 0 % (ref 0.0–0.2)

## 2018-08-18 LAB — GLUCOSE, CAPILLARY
Glucose-Capillary: 107 mg/dL — ABNORMAL HIGH (ref 70–99)
Glucose-Capillary: 117 mg/dL — ABNORMAL HIGH (ref 70–99)
Glucose-Capillary: 119 mg/dL — ABNORMAL HIGH (ref 70–99)
Glucose-Capillary: 128 mg/dL — ABNORMAL HIGH (ref 70–99)
Glucose-Capillary: 146 mg/dL — ABNORMAL HIGH (ref 70–99)
Glucose-Capillary: 165 mg/dL — ABNORMAL HIGH (ref 70–99)
Glucose-Capillary: 170 mg/dL — ABNORMAL HIGH (ref 70–99)
Glucose-Capillary: 205 mg/dL — ABNORMAL HIGH (ref 70–99)
Glucose-Capillary: 234 mg/dL — ABNORMAL HIGH (ref 70–99)
Glucose-Capillary: 257 mg/dL — ABNORMAL HIGH (ref 70–99)

## 2018-08-18 LAB — BASIC METABOLIC PANEL
Anion gap: 9 (ref 5–15)
Anion gap: 9 (ref 5–15)
Anion gap: 10 (ref 5–15)
Anion gap: 11 (ref 5–15)
BUN: 13 mg/dL (ref 8–23)
BUN: 12 mg/dL (ref 8–23)
BUN: 11 mg/dL (ref 8–23)
BUN: 10 mg/dL (ref 8–23)
CO2: 18 mmol/L — ABNORMAL LOW (ref 22–32)
CO2: 19 mmol/L — ABNORMAL LOW (ref 22–32)
CO2: 20 mmol/L — ABNORMAL LOW (ref 22–32)
CO2: 18 mmol/L — ABNORMAL LOW (ref 22–32)
Calcium: 7.9 mg/dL — ABNORMAL LOW (ref 8.9–10.3)
Calcium: 8 mg/dL — ABNORMAL LOW (ref 8.9–10.3)
Calcium: 8 mg/dL — ABNORMAL LOW (ref 8.9–10.3)
Calcium: 8.1 mg/dL — ABNORMAL LOW (ref 8.9–10.3)
Chloride: 108 mmol/L (ref 98–111)
Chloride: 109 mmol/L (ref 98–111)
Chloride: 109 mmol/L (ref 98–111)
Chloride: 112 mmol/L — ABNORMAL HIGH (ref 98–111)
Creatinine, Ser: 0.87 mg/dL (ref 0.61–1.24)
Creatinine, Ser: 0.73 mg/dL (ref 0.61–1.24)
Creatinine, Ser: 0.68 mg/dL (ref 0.61–1.24)
Creatinine, Ser: 1.01 mg/dL (ref 0.61–1.24)
GFR calc Af Amer: >60 mL/min (ref >60)
GFR calc Af Amer: >60 mL/min (ref >60)
GFR calc Af Amer: >60 mL/min (ref >60)
GFR calc Af Amer: >60 mL/min (ref >60)
GFR calc non Af Amer: >60 mL/min (ref >60)
GFR calc non Af Amer: >60 mL/min (ref >60)
GFR calc non Af Amer: >60 mL/min (ref >60)
GFR calc non Af Amer: >60 mL/min (ref >60)
Glucose, Bld: 303 mg/dL — ABNORMAL HIGH (ref 70–99)
Glucose, Bld: 246 mg/dL — ABNORMAL HIGH (ref 70–99)
Glucose, Bld: 133 mg/dL — ABNORMAL HIGH (ref 70–99)
Glucose, Bld: 180 mg/dL — ABNORMAL HIGH (ref 70–99)
Potassium: 4.1 mmol/L (ref 3.5–5.1)
Potassium: 3.9 mmol/L (ref 3.5–5.1)
Potassium: 3.2 mmol/L — ABNORMAL LOW (ref 3.5–5.1)
Potassium: 5.5 mmol/L — ABNORMAL HIGH (ref 3.5–5.1)
Sodium: 135 mmol/L (ref 135–145)
Sodium: 137 mmol/L (ref 135–145)
Sodium: 139 mmol/L (ref 135–145)
Sodium: 141 mmol/L (ref 135–145)

## 2018-08-18 LAB — MAGNESIUM
Magnesium: 1.8 mg/dL (ref 1.7–2.4)
Magnesium: 1.8 mg/dL (ref 1.7–2.4)
Magnesium: 2.3 mg/dL (ref 1.7–2.4)

## 2018-08-18 LAB — PROTIME-INR
INR: 1.1 (ref 0.8–1.2)
INR: 1 (ref 0.8–1.2)
Prothrombin Time: 13.8 seconds (ref 11.4–15.2)
Prothrombin Time: 13.4 seconds (ref 11.4–15.2)

## 2018-08-18 LAB — POCT I-STAT 7, (LYTES, BLD GAS, ICA,H+H)
Acid-base deficit: 3 mmol/L — ABNORMAL HIGH (ref 0.0–2.0)
Bicarbonate: 21.2 mmol/L (ref 20.0–28.0)
Calcium, Ion: 1.15 mmol/L (ref 1.15–1.40)
HCT: 35 % — ABNORMAL LOW (ref 39.0–52.0)
Hemoglobin: 11.9 g/dL — ABNORMAL LOW (ref 13.0–17.0)
O2 Saturation: 100 %
Patient temperature: 33
Potassium: 3.2 mmol/L — ABNORMAL LOW (ref 3.5–5.1)
Sodium: 142 mmol/L (ref 135–145)
TCO2: 22 mmol/L (ref 22–32)
pCO2 arterial: 29.1 mmHg — ABNORMAL LOW (ref 32.0–48.0)
pH, Arterial: 7.454 — ABNORMAL HIGH (ref 7.350–7.450)
pO2, Arterial: 192 mmHg — ABNORMAL HIGH (ref 83.0–108.0)

## 2018-08-18 LAB — APTT
aPTT: 44 seconds — ABNORMAL HIGH (ref 24–36)
aPTT: 34 seconds (ref 24–36)

## 2018-08-18 LAB — TROPONIN I
Troponin I: 2.07 ng/mL (ref <0.03)
Troponin I: 2.56 ng/mL (ref <0.03)
Troponin I: 2.77 ng/mL

## 2018-08-18 LAB — PHOSPHORUS
Phosphorus: 1.7 mg/dL — ABNORMAL LOW (ref 2.5–4.6)
Phosphorus: 1.8 mg/dL — ABNORMAL LOW (ref 2.5–4.6)
Phosphorus: 3.7 mg/dL (ref 2.5–4.6)

## 2018-08-18 MED ORDER — ASPIRIN 81 MG PO CHEW: 81.0000 mg | CHEWABLE_TABLET | Freq: Every day | ORAL | Status: DC

## 2018-08-18 MED ORDER — POTASSIUM CHLORIDE 20 MEQ/15ML (10%) PO SOLN
30.0000 meq | ORAL | Status: AC
  Administered 2018-08-18: 07:00:00 30 meq
  Filled 2018-08-18: qty 30

## 2018-08-18 MED ORDER — CHLORHEXIDINE GLUCONATE CLOTH 2 % EX PADS
6.0000 | MEDICATED_PAD | Freq: Every day | CUTANEOUS | Status: DC
  Administered 2018-08-18 – 2018-08-24 (×7): 6 via TOPICAL

## 2018-08-18 MED ORDER — VECURONIUM BROMIDE 10 MG IV SOLR
5.0000 mg | Freq: Once | INTRAVENOUS | Status: AC
  Administered 2018-08-18: 18:00:00 5 mg via INTRAVENOUS

## 2018-08-18 MED ORDER — SODIUM CHLORIDE 0.9 % IV SOLN
1.0000 ug/kg/min | INTRAVENOUS | Status: DC
  Administered 2018-08-18: 1 ug/kg/min via INTRAVENOUS
  Filled 2018-08-18 (×2): qty 20

## 2018-08-18 MED ORDER — VITAL AF 1.2 CAL PO LIQD
1000.0000 mL | ORAL | Status: DC
  Administered 2018-08-18 – 2018-08-19 (×2): 1000 mL

## 2018-08-18 MED ORDER — VITAL HIGH PROTEIN PO LIQD: 1000.0000 mL | ORAL | Status: DC

## 2018-08-18 MED ORDER — PHENYLEPHRINE HCL-NACL 10-0.9 MG/250ML-% IV SOLN
25.0000 ug/min | INTRAVENOUS | Status: DC
  Administered 2018-08-18: 40 ug/min via INTRAVENOUS
  Administered 2018-08-18: 25 ug/min via INTRAVENOUS
  Administered 2018-08-18: 14:00:00 30 ug/min via INTRAVENOUS
  Administered 2018-08-19: 55 ug/min via INTRAVENOUS
  Administered 2018-08-19: 50 ug/min via INTRAVENOUS
  Administered 2018-08-19: 80 ug/min via INTRAVENOUS
  Filled 2018-08-18 (×7): qty 250

## 2018-08-18 MED ORDER — SODIUM CHLORIDE 0.9 % IV SOLN
3.0000 g | Freq: Three times a day (TID) | INTRAVENOUS | Status: AC
  Administered 2018-08-18 – 2018-08-20 (×6): 3 g via INTRAVENOUS
  Filled 2018-08-18 (×6): qty 3

## 2018-08-18 MED ORDER — SODIUM CHLORIDE 0.9 % IV SOLN
250.0000 mL | INTRAVENOUS | Status: DC
  Administered 2018-08-18 – 2018-08-28 (×3): 250 mL via INTRAVENOUS

## 2018-08-18 MED ORDER — ATORVASTATIN CALCIUM 80 MG PO TABS: 80.0000 mg | ORAL_TABLET | Freq: Every day | ORAL | Status: DC

## 2018-08-18 MED ORDER — POTASSIUM CHLORIDE 10 MEQ/50ML IV SOLN
10.0000 meq | INTRAVENOUS | Status: AC
  Administered 2018-08-18 (×3): 10 meq via INTRAVENOUS
  Filled 2018-08-18 (×3): qty 50

## 2018-08-18 MED ORDER — ASPIRIN 81 MG PO CHEW
81.0000 mg | CHEWABLE_TABLET | Freq: Every day | ORAL | Status: DC
  Administered 2018-08-18 – 2018-08-25 (×8): 81 mg
  Filled 2018-08-18 (×8): qty 1

## 2018-08-18 MED ORDER — MIDAZOLAM 50MG/50ML (1MG/ML) PREMIX INFUSION
1.0000 mg/h | INTRAVENOUS | Status: DC
  Administered 2018-08-18: 2 mg/h via INTRAVENOUS
  Administered 2018-08-18: 1 mg/h via INTRAVENOUS
  Administered 2018-08-18: 4 mg/h via INTRAVENOUS
  Administered 2018-08-19 – 2018-08-21 (×2): 2 mg/h via INTRAVENOUS
  Filled 2018-08-18 (×5): qty 50

## 2018-08-18 MED ORDER — MAGNESIUM SULFATE 2 GM/50ML IV SOLN
2.0000 g | Freq: Once | INTRAVENOUS | Status: AC
  Administered 2018-08-18: 11:00:00 2 g via INTRAVENOUS
  Filled 2018-08-18: qty 50

## 2018-08-18 MED ORDER — PRO-STAT SUGAR FREE PO LIQD: 30.0000 mL | Freq: Two times a day (BID) | ORAL | Status: DC

## 2018-08-18 MED ORDER — SODIUM CHLORIDE 0.9% FLUSH: 10.0000 mL | Freq: Two times a day (BID) | INTRAVENOUS | Status: DC

## 2018-08-18 MED ORDER — ATORVASTATIN CALCIUM 80 MG PO TABS
80.0000 mg | ORAL_TABLET | Freq: Every day | ORAL | Status: DC
  Administered 2018-08-18 – 2018-08-24 (×7): 80 mg
  Filled 2018-08-18 (×7): qty 1

## 2018-08-18 MED ORDER — VECURONIUM BROMIDE 10 MG IV SOLR
INTRAVENOUS | Status: AC
  Filled 2018-08-18: qty 10

## 2018-08-18 MED ORDER — SODIUM CHLORIDE 0.9% FLUSH: 10.0000 mL | INTRAVENOUS | Status: DC | PRN

## 2018-08-18 NOTE — Progress Notes (Signed)
[facility] Progress Note Patient Name: [name] [name] DOB: 09-Aug-1940 MRN: [id number]   Date of Service  08/18/2018  HPI/Events of Note  K+ 3.2, BP 87/60, patient is awaiting PICC placement to begin Norepinephrine infusion.  eICU Interventions  KCL 30 meq via NGT Q 4 hours x 2 doses per protocol, order for Phenylephrine infusion via peripheral line entered pending placement of PICC line        Sayid Moll U [name] 08/18/2018, 6:42 AM

## 2018-08-18 NOTE — Progress Notes (Signed)
EEG Completed; Results Pending  

## 2018-08-18 NOTE — Progress Notes (Signed)
Peripherally Inserted Central Catheter/Midline Placement  The IV Nurse has discussed with the patient and/or persons authorized to consent for the patient, the purpose of this procedure and the potential benefits and risks involved with this procedure.  The benefits include less needle sticks, lab draws from the catheter, and the patient may be discharged home with the catheter. Risks include, but not limited to, infection, bleeding, blood clot (thrombus formation), and puncture of an artery; nerve damage and irregular heartbeat and possibility to perform a PICC exchange if needed/ordered by physician.  Alternatives to this procedure were also discussed.  Bard Power PICC patient education guide, fact sheet on infection prevention and patient information card has been provided to patient /or left at bedside.    PICC/Midline Placement Documentation  PICC Triple Lumen 10/10/74 PICC Right Basilic 45 cm 0 cm (Active)  Indication for Insertion or Continuance of Line Prolonged intravenous therapies 08/18/2018 10:00 AM  Exposed Catheter (cm) 0 cm 08/18/2018 10:00 AM  Site Assessment Dry;Clean;Intact 08/18/2018 10:00 AM  Lumen #1 Status Flushed;Blood return noted;Saline locked 08/18/2018 10:00 AM  Lumen #2 Status Flushed;Blood return noted;Saline locked 08/18/2018 10:00 AM  Lumen #3 Status Flushed;Blood return noted;Saline locked 08/18/2018 10:00 AM  Dressing Type Transparent 08/18/2018 10:00 AM  Dressing Status Clean;Dry;Antimicrobial disc in place;Intact 08/18/2018 10:00 AM  Dressing Change Due 08/25/18 08/18/2018 10:00 AM       Valentina Shaggy Ramos 08/18/2018, 10:17 AM

## 2018-08-18 NOTE — Progress Notes (Signed)
Initial Nutrition Assessment  RD working remotely.  DOCUMENTATION CODES:   Not applicable, will assess for malnutrition at follow-up after completion of NFPE  INTERVENTION:   Tube feeding via OG tube: - Start Vital AF 1.2 @ 20 ml/hr and increase rate by 10 ml/hr q 4 hours until goal rate of 60 ml/hr (1440 ml/day)  Tube feeding regimen provides 1728 kcal, 108 grams of protein, and 1168 ml of H2O (102% of kcal needs, 100% of protein needs).  NUTRITION DIAGNOSIS:   Inadequate oral intake related to inability to eat as evidenced by NPO status.  GOAL:   Patient will meet greater than or equal to 90% of their needs  MONITOR:   Vent status, Labs, TF tolerance, I & O's, Weight trends  REASON FOR ASSESSMENT:   Ventilator, Consult Enteral/tube feeding initiation and management  ASSESSMENT:   78 year old [name] [name] who presented to the ED on 4/30 after a witnessed cardiac arrest. Immediate bystander CPR initiated. Pt found to be in V-fib on EMS arrival and was given 1 round of Epi and 2 shocks. ROSC achieved after ~11 minutes of CPR. Pt intubated in the field. Pt transferred to the ICU and TTM initiated. PMH of T2DM.  Warming started this AM per CCM. Plan is for paralysis to stop once pt is back to 36 degree target per normothermia protocol. Per CCM, will start TF once Nimbex is off. Discussed with RN.  No weight history available in chart. Unable to obtain diet and weight history from pt at this time.  Adult ICU TF Protocol ordered. Will change formula to better meet pt's needs. OGT in stomach per x-ray.  Patient is currently intubated on ventilator support. Paralyzed, sedated, and on pressor support. MV: 7.7 L/min Temp (24hrs), Avg:94.6 F (34.8 C), Min:90.3 F (32.4 C), Max:97.3 F (36.3 C) BP (a-line): 102/50 MAP (a-line): 68  NS: 20 ml/hr Nimbex: 5.4 ml/hr Fentanyl: 12.5 ml/hr Versed: 1 ml/hr Neosynephrine: 60 ml/hr  Medications reviewed and include: Protonix, KCl 30 mEq  x 2, IV abx, IV magnesium sulfate 2 grams once  Labs reviewed: phosphorus 1.8 (L), potassium 3.2 (L) CBG's: 107, 117, 128, 205, 234, 257 x 12 hours (trending down, now of insulin drip)  UOP: 1910 ml x 24 hours OGT: 400 ml x 24 hours  NUTRITION - FOCUSED PHYSICAL EXAM:  Unable to complete at this time. RD working remotely.  Diet Order:   Diet Order            Diet NPO time specified  Diet effective now              EDUCATION NEEDS:   Not appropriate for education at this time  Skin:  Skin Assessment: Reviewed RN Assessment  Last BM:  08/17/18 large type 6  Height:   Ht Readings from Last 1 Encounters:  08/17/18 5\' 11"  (1.803 m)    Weight:   Wt Readings from Last 1 Encounters:  08/18/18 79.7 kg    Ideal Body Weight:  78.2 kg  BMI:  Body mass index is 24.51 kg/m.  Estimated Nutritional Needs:   Kcal:  1693  Protein:  105-120 grams  Fluid:  >/= 1.7 L    Gaynell Face, MS, RD, LDN Inpatient Clinical Dietitian Pager: [phone] Weekend/After Hours: [phone]

## 2018-08-18 NOTE — Procedures (Signed)
Advance A. Merlene Laughter, MD     [email or web address]           HISTORY: The patient is 78 year old male who presents with an episode of syncope suspicious for seizures.  MEDICATIONS:  Current Facility-Administered Medications:  .  Place/Maintain arterial line, , , Until Discontinued **AND** 0.9 %  sodium chloride infusion, , Intra-arterial, PRN, Olalere, Adewale A, MD, Stopped at 08/18/18 1328 .  0.9 %  sodium chloride infusion, , Intravenous, PRN, Olalere, Adewale A, MD, Last Rate: 20 mL/hr at 08/18/18 1300 .  0.9 %  sodium chloride infusion, 250 mL, Intravenous, Continuous, Ogan, Okoronkwo U, MD, Last Rate: 10 mL/hr at 08/18/18 1400 .  Ampicillin-Sulbactam (UNASYN) 3 g in sodium chloride 0.9 % 100 mL IVPB, 3 g, Intravenous, Q8H, Icard, Bradley L, DO, Stopped at 08/18/18 1122 .  aspirin chewable tablet 81 mg, 81 mg, Per Tube, Daily, Einar Grad, RPH, Stopped at 08/18/18 1159 .  atorvastatin (LIPITOR) tablet 80 mg, 80 mg, Per Tube, q1800, Einar Grad, RPH .  chlorhexidine gluconate (MEDLINE KIT) (PERIDEX) 0.12 % solution 15 mL, 15 mL, Mouth Rinse, BID, Olalere, Adewale A, MD, 15 mL at 08/18/18 1213 .  Chlorhexidine Gluconate Cloth 2 % PADS 6 each, 6 each, Topical, Daily, Icard, Bradley L, DO .  [DISCONTINUED] cisatracurium (NIMBEX) bolus via infusion 9.1 mg, 0.1 mg/kg, Intravenous, Once **AND** cisatracurium (NIMBEX) 200 mg in sodium chloride 0.9 % 200 mL (1 mg/mL) infusion, 1-1.5 mcg/kg/min, Intravenous, Continuous, Stopped at 08/18/18 1040 **AND** cisatracurium (NIMBEX) bolus via infusion 4.5 mg, 0.05 mg/kg, Intravenous, PRN, Olalere, Adewale A, MD .  feeding supplement (VITAL AF 1.2 CAL) liquid 1,000 mL, 1,000 mL, Per Tube, Continuous, Icard, Bradley L, DO .  fentaNYL (SUBLIMAZE) bolus via infusion 25 mcg, 25 mcg, Intravenous, Q30 min PRN, Olalere, Adewale A, MD .  fentaNYL (SUBLIMAZE) injection 25 mcg, 25 mcg, Intravenous, Q15 min PRN, Billy Fischer, Erin, MD  .  fentaNYL (SUBLIMAZE) injection 50 mcg, 50 mcg, Intravenous, Q2H PRN, Andee Poles, MD .  fentaNYL 2593mg in NS 2538m(1036mml) infusion-PREMIX, 100-300 mcg/hr, Intravenous, Continuous, Olalere, Adewale A, MD, Last Rate: 30 mL/hr at 08/18/18 1429, 300 mcg/hr at 08/18/18 1429 .  heparin injection 5,000 Units, 5,000 Units, Subcutaneous, Q8H, Olalere, Adewale A, MD, 5,000 Units at 08/18/18 1424 .  ipratropium-albuterol (DUONEB) 0.5-2.5 (3) MG/3ML nebulizer solution 3 mL, 3 mL, Nebulization, Q6H PRN, Olalere, Adewale A, MD .  MEDLINE mouth rinse, 15 mL, Mouth Rinse, 10 times per day, Olalere, Adewale A, MD, 15 mL at 08/18/18 1434 .  midazolam (VERSED) 50 mg/50 mL (1 mg/mL) premix infusion, 1-4 mg/hr, Intravenous, Continuous, Icard, Bradley L, DO, Last Rate: 4 mL/hr at 08/18/18 1430, 4 mg/hr at 08/18/18 1430 .  norepinephrine (LEVOPHED) 4mg29m 250mL57mmix infusion, 0-50 mcg/min, Intravenous, Titrated, Olalere, Adewale A, MD .  pantoprazole (PROTONIX) injection 40 mg, 40 mg, Intravenous, QHS, Olalere, Adewale A, MD, 40 mg at 08/17/18 2120 .  phenylephrine (NEOSYNEPHRINE) 10-0.9 MG/250ML-% infusion, 25-200 mcg/min, Intravenous, Titrated, Ogan, Okoronkwo U, MD, Last Rate: 45 mL/hr at 08/18/18 1426, 30 mcg/min at 08/18/18 1426 .  sodium chloride flush (NS) 0.9 % injection 10-40 mL, 10-40 mL, Intracatheter, PRN, Icard, Bradley L, DO     ANALYSIS: A 16 channel recording using standard 10 20 measurements is conducted for 21 minutes.  There is a well-formed posterior dominant rhythm of 10 Hz which attenuates with eye opening.  There is beta activity observed in the frontal areas.  Occasional drowsy activities is  noted.  Photic stimulation and hyperventilation are not conducted.  There is no focal or lateral slowing.  There is no epileptiform activity is observed.  IMPRESSION: 1.  There is a normal recording of awake and drowsy states.      Anastasio Wogan A. Merlene Laughter, M.D.  Diplomate, Tax adviser of  Psychiatry and Neurology ( Neurology).

## 2018-08-18 NOTE — Progress Notes (Addendum)
Pt observed having severet shivers. Sedative bolus and bear hugger initiated. Dr. Valeta Harms called to bedside. Paralytic ordered to address shivering (effective). 3 hrs later, Shivering returned and HR in 140s. Paralytic drip ordered to start.   [name] [name]. 08/18/2018. 0350

## 2018-08-18 NOTE — Progress Notes (Addendum)
Cardiology Rounding Note   Subjective:    Remains intubated/sedated on cooling protocol. Rhythm stable in sinus   Troponin 2.56 today  On neo 45 for BP support   Objective:   Weight Range:  Vital Signs:   Temp:  [90.3 F (32.4 C)-97.3 F (36.3 C)] 91.2 F (32.9 C) (05/01 0800) Pulse Rate:  [36-146] 53 (05/01 0818) Resp:  [11-30] 16 (05/01 0800) BP: (85-191)/(49-112) 112/82 (05/01 0800) SpO2:  [94 %-100 %] 100 % (05/01 0818) Arterial Line BP: (84-177)/(43-84) 98/48 (05/01 0800) FiO2 (%):  [60 %-100 %] 60 % (05/01 0818) Weight:  [79.7 kg-90.7 kg] 79.7 kg (05/01 0500) Last BM Date: 08/17/18  Weight change: Filed Weights   08/17/18 1532 08/18/18 0500  Weight: 90.7 kg 79.7 kg    Intake/Output:   Intake/Output Summary (Last 24 hours) at 08/18/2018 0840 Last data filed at 08/18/2018 0800 Gross per 24 hour  Intake 2250.34 ml  Output 2430 ml  Net -179.66 ml     Physical Exam: General:  Intubated/sedated HEENT: normal + ETT hematoma on forehead Neck: supple. JVP 9-10 . Carotids 2+ bilat; no bruits. No lymphadenopathy or thryomegaly appreciated. Cor: PMI nondisplaced. Regular rate & rhythm. No rubs, gallops or murmurs. Lungs: clear  + cooling pads in place Abdomen: soft, nontender, nondistended. No hepatosplenomegaly. No bruits or masses. Good bowel sounds.  Extremities: no cyanosis, clubbing, rash, edema Neuro: intubated/sedated  Telemetry: Sinus 50-60s with frequent PACs. Very dimunitive p waves. No VT/VF Personally reviewed   Labs: Basic Metabolic Panel: Recent Labs  Lab 08/17/18 2045 08/17/18 2053  08/17/18 2239 08/18/18 0017 08/18/18 0220 08/18/18 0526 08/18/18 0553  NA 139  --    < > 137 135 137 139 142  K 3.8  --    < > 4.3 4.1 3.9 3.2* 3.2*  CL 107  --   --  107 108 109 109  --   CO2 19*  --   --  19* 18* 19* 20*  --   GLUCOSE 229*  --   --  257* 303* 246* 133*  --   BUN 12  --   --  _0 --   CREATININE 0.79  --   --  0.59* 0.87 0.73  0.68  --   CALCIUM 8.7*  --   --  8.3* 7.9* 8.0* 8.0*  --   MG  --  1.7  --   --   --   --  1.8  --   PHOS  --   --   --   --   --   --  1.7*  --    < > = values in this interval not displayed.    Liver Function Tests: Recent Labs  Lab 08/17/18 1532  AST 336*  ALT 257*  ALKPHOS 72  BILITOT 0.6  PROT 5.8*  ALBUMIN 3.2*   No results for input(s): LIPASE, AMYLASE in the last 168 hours. No results for input(s): AMMONIA in the last 168 hours.  CBC: Recent Labs  Lab 08/17/18 1532  08/17/18 1751 08/17/18 1951 08/17/18 2217 08/18/18 0220 08/18/18 0526 08/18/18 0553  WBC 18.0*  --  27.0*  --   --  17.5* 14.3*  --   HGB 11.9*   < > 13.7 13.3 12.9* 12.3* 12.1* 11.9*  HCT 39.2   < > 44.1 39.0 38.0* 38.4* 37.2* 35.0*  MCV 88.7  --  87.2  --   --  84.2 83.6  --  PLT 308  --  309  --   --  261 248  --    < > = values in this interval not displayed.    Cardiac Enzymes: Recent Labs  Lab 08/17/18 1532 08/17/18 1751 08/17/18 1957 08/18/18 0017 08/18/18 0526  TROPONINI 0.03* 0.26* 1.28* 2.07* 2.56*    BNP: BNP (last 3 results) No results for input(s): BNP in the last 8760 hours.  ProBNP (last 3 results) No results for input(s): PROBNP in the last 8760 hours.    Other results:  Imaging: Ct Head Wo Contrast  Result Date: 08/17/2018 CLINICAL DATA:  Head injury, post CPR, unresponsive EXAM: CT HEAD WITHOUT CONTRAST CT CERVICAL SPINE WITHOUT CONTRAST TECHNIQUE: Multidetector CT imaging of the head and cervical spine was performed following the standard protocol without intravenous contrast. Multiplanar CT image reconstructions of the cervical spine were also generated. COMPARISON:  None. FINDINGS: CT HEAD FINDINGS Brain: No evidence of acute infarction, hemorrhage, hydrocephalus, extra-axial collection or mass lesion/mass effect. Periventricular white matter hypodensity. Vascular: No hyperdense vessel or unexpected calcification. Skull: Normal. Negative for fracture or focal  lesion. Sinuses/Orbits: No acute finding. Other: None. CT CERVICAL SPINE FINDINGS Alignment: Normal. Skull base and vertebrae: No acute fracture. No primary bone lesion or focal pathologic process. Soft tissues and spinal canal: No prevertebral fluid or swelling. No visible canal hematoma. Disc levels: Generally mild multilevel disc degenerative disease and osteophytosis. Upper chest: Negative. Other: Partially imaged endotracheal and orogastric tubes. IMPRESSION: 1. No acute intracranial pathology. Small-vessel white matter disease. 2.  No fracture or static subluxation of the cervical spine. Electronically Signed   By: Eddie Candle M.D.   On: 08/17/2018 16:51   Ct Cervical Spine Wo Contrast  Result Date: 08/17/2018 CLINICAL DATA:  Head injury, post CPR, unresponsive EXAM: CT HEAD WITHOUT CONTRAST CT CERVICAL SPINE WITHOUT CONTRAST TECHNIQUE: Multidetector CT imaging of the head and cervical spine was performed following the standard protocol without intravenous contrast. Multiplanar CT image reconstructions of the cervical spine were also generated. COMPARISON:  None. FINDINGS: CT HEAD FINDINGS Brain: No evidence of acute infarction, hemorrhage, hydrocephalus, extra-axial collection or mass lesion/mass effect. Periventricular white matter hypodensity. Vascular: No hyperdense vessel or unexpected calcification. Skull: Normal. Negative for fracture or focal lesion. Sinuses/Orbits: No acute finding. Other: None. CT CERVICAL SPINE FINDINGS Alignment: Normal. Skull base and vertebrae: No acute fracture. No primary bone lesion or focal pathologic process. Soft tissues and spinal canal: No prevertebral fluid or swelling. No visible canal hematoma. Disc levels: Generally mild multilevel disc degenerative disease and osteophytosis. Upper chest: Negative. Other: Partially imaged endotracheal and orogastric tubes. IMPRESSION: 1. No acute intracranial pathology. Small-vessel white matter disease. 2.  No fracture or static  subluxation of the cervical spine. Electronically Signed   By: Eddie Candle M.D.   On: 08/17/2018 16:51   Dg Chest Port 1 View  Result Date: 08/18/2018 CLINICAL DATA:  Cardiac arrest and respiratory failure. EXAM: PORTABLE CHEST 1 VIEW COMPARISON:  08/17/2018 FINDINGS: Endotracheal tube remains with the tip approximately 2 cm above the carina. Gastric decompression tube extends into the stomach. Lungs show improved aeration on the right with mild subsegmental atelectasis an a perihilar location. There is slight increase in left lower lobe atelectasis with potential component of a small left pleural effusion. No pulmonary edema or pneumothorax identified. No visible bony fractures. IMPRESSION: Improved aeration of the right lung. Increase in left lower lobe atelectasis with potential small left pleural effusion. Electronically Signed   By: Aletta Edouard  M.D.   On: 08/18/2018 07:40   Dg Chest Port 1 View  Result Date: 08/17/2018 CLINICAL DATA:  Intubation EXAM: PORTABLE CHEST 1 VIEW COMPARISON:  Portable exam 1545 hours without priors for comparison FINDINGS: Tip of endotracheal tube projects 3.4 cm above carina. Nasogastric tube extends into stomach. Normal heart size, mediastinal contours, and pulmonary vascularity. Atherosclerotic calcification aorta. Subsegmental atelectasis in RIGHT upper lobe and at LEFT base. Additional perihilar and LEFT lower lobe infiltrates. No pleural effusion or pneumothorax. IMPRESSION: BILATERAL pulmonary infiltrates and scattered atelectasis as above. Electronically Signed   By: Lavonia Dana M.D.   On: 08/17/2018 16:30   [place] Ekg Site Rite  Result Date: 08/17/2018 If Site Rite image not attached, placement could not be confirmed due to current cardiac rhythm.     Medications:     Scheduled Medications:  artificial tears  1 application Both Eyes E8F   aspirin  150 mg Rectal Daily   aspirin  300 mg Rectal NOW   chlorhexidine gluconate (MEDLINE KIT)  15 mL Mouth  Rinse BID   cisatracurium  0.1 mg/kg Intravenous Once   fentaNYL (SUBLIMAZE) injection  50 mcg Intravenous Once   heparin  5,000 Units Subcutaneous Q8H   mouth rinse  15 mL Mouth Rinse 10 times per day   pantoprazole (PROTONIX) IV  40 mg Intravenous QHS   potassium chloride  30 mEq Per Tube Q4H     Infusions:  sodium chloride 100 mL/hr at 08/18/18 0137   sodium chloride     sodium chloride Stopped (08/18/18 0258)   sodium chloride Stopped (08/18/18 0655)   amiodarone 30 mg/hr (08/18/18 0700)   ampicillin-sulbactam (UNASYN) IV     cisatracurium (NIMBEX) infusion 1 mcg/kg/min (08/18/18 0700)   fentaNYL infusion INTRAVENOUS 125 mcg/hr (08/18/18 0700)   midazolam 1 mg/hr (08/18/18 0748)   norepinephrine (LEVOPHED) Adult infusion     phenylephrine (NEO-SYNEPHRINE) Adult infusion 45 mcg/min (08/18/18 0700)     PRN Medications:  Place/Maintain arterial line **AND** sodium chloride, sodium chloride, cisatracurium **AND** cisatracurium (NIMBEX) infusion **AND** cisatracurium, fentaNYL, fentaNYL (SUBLIMAZE) injection, fentaNYL (SUBLIMAZE) injection, ipratropium-albuterol   Assessment:   COLSON BARCO is a 78 y.o. male with h/o DM2 (remainde of medical Hx unknown) admitted with VF arrest on 08/17/18   Plan/Discussion:    1. VFib Arrest: out of hospital VFib arrest w/ ROSC after 11 min of CPR + 1 round of Epi and 2 shocks. ED EKG shows afib w/ RVR. Pt intubated and unresponsive. IV Amiodarone ordered. Labs pending. Will need to check K, Mg, troponin x 3 and obtain 2D echo. Monitor on tele for recurrent arrhythmias. Post arrest care/ vent management per PCCM. - by history it sounds like hypoglycemia and possible aspiration may have triggered but initial rhythm was VF and not PEA thus suspect possibly ischemic verus primary VF in setting of undiagnosed underlying cardiomyopathy  - first troponin negative but subsequently 2.6. ECG without ischemic changes. Per bystander/EMS  report no mention of preceding CP - rhythm stable today - Continue supportive care and therapeutic hypothermia  - Keep K> 4.0, Mag > 2.0 - check echo today - will need cath if/when recovers  2. Elevated troponin - unclear if primary issue or demand ischemia - continue ASA. Start statin - Cath if/when recovers  3. Hypokalemia - will supp   4. Hypomag - will supp  5. Possible AF with RVR - very transient on arrival to ER. Now back in SR.  (p-waves very small and having a  lot of PACs so hard to assess at times) - with fall and head trauma would not start heparin at this point - can start amio if recurs - check echo   CRITICAL CARE Performed by: Glori Bickers  Total critical care time: 35 minutes  Critical care time was exclusive of separately billable procedures and treating other patients.  Critical care was necessary to treat or prevent imminent or life-threatening deterioration.  Critical care was time spent personally by me (independent of midlevel providers or residents) on the following activities: development of treatment plan with patient and/or surrogate as well as nursing, discussions with consultants, evaluation of patient's response to treatment, examination of patient, obtaining history from patient or surrogate, ordering and performing treatments and interventions, ordering and review of laboratory studies, ordering and review of radiographic studies, pulse oximetry and re-evaluation of patient's condition.   Length of Stay: 1   Glori Bickers MD 08/18/2018, 8:40 AM  Advanced Heart Failure Team Pager [phone] (M-F; [place])  Please contact [facility] for night-coverage after hours (4p -7a ) and weekends on [email or web address]

## 2018-08-18 NOTE — Progress Notes (Signed)
NAME:  [name] [name], MRN:  [id number], DOB:  [birth year], LOS: 1 ADMISSION DATE:  08/17/2018, CONSULTATION DATE:  08/17/2018 REFERRING MD: ED provider, CHIEF COMPLAINT: Post cardiac arrest  Brief History   Middle-age gentleman with background history of diabetes Was feeling well up until today He did go to the store where he had a witnessed collapse, bystander noted complaints of fatigue and concern about his sugar He was attempting to eat a snack before he had his episode of collapse CPR initiated immediately ACLS crew arrived within 5 minutes Total of 11 minutes prior to [place] Required 2 shocks for V. Fib Comatose on presentation to the emergency department  History of present illness   Denies any complaints of fever, cough, shortness of breath prior to leaving for the store  Past Medical History  Diabetes-according to family well-controlled  Significant Hospital Events     Consults:  cardiology  Procedures:    Significant Diagnostic Tests:  CT head IMPRESSION: 1. No acute intracranial pathology. Small-vessel white matter disease. 2.  No fracture or static subluxation of the cervical spine.  Chest x-ray reviewed by myself showing mild atelectasis, possible left lower lobe infiltrate  EKG did not reveal any ST T wave changes  Micro Data:    Antimicrobials:  Azithromycin 08/17/2018>> Ceftriaxone 08/17/2018>>  Interim history/subjective:  Hypotensive overnight. On propofol and nimbex for sedation. I spoke with nursing this morning about adjusting sedation rates. Currently paralyzed and cooled to 33deg.   Objective   Blood pressure 112/82, pulse (!) 36, temperature (!) 91.2 F (32.9 C), temperature source Core, resp. rate 16, height 5\' 11"  (1.803 m), weight 79.7 kg, SpO2 100 %.    Vent Mode: PRVC FiO2 (%):  [60 %-100 %] 60 % Set Rate:  [14 bmp-20 bmp] 14 bmp Vt Set:  [600 mL] 600 mL PEEP:  [5 cmH20] 5 cmH20 Plateau Pressure:  [14 cmH20-17 cmH20] 16 cmH20    Intake/Output Summary (Last 24 hours) at 08/18/2018 0816 Last data filed at 08/18/2018 0700 Gross per 24 hour  Intake 2250.34 ml  Output 2310 ml  Net -59.66 ml   Filed Weights   08/17/18 1532 08/18/18 0500  Weight: 90.7 kg 79.7 kg    Examination: General: elderly male, intubated, sedated on mechanical ventilation, critically ill appearing  HENT: scalp abrasion, sclera clear Lungs: BL vented breath sounds, no crackles no wheeze  Cardiovascular: S1 S2, bradycardic, irregular  Abdomen: soft, ntr nd, cooling pads in place  Extremities: no edema  Neuro: sedated and paralyzed, not responsive  GU: foley in place, clear urine   Resolved Hospital Problem list     Assessment & Plan:   Status-post cardiac arrest V. Fib cardiac arrest Elevated troponin  - started on hypothermia protocol yesterday at 33 deg, over night with ongoing hemodynamic instability - orders changed this morning to 36deg target  - starting the warming process at this time  - cardiology consultation appreciated  - paralysis to stop once back to 36 deg  - care coordinated with nursing staff  Shock, hypotension   - vasodilatory, medication effect - patient on propofol and paralysis  - sedation changed, to fent + versed - paralysis being stopped once at 36 deg - wean off NEO to maintain MAP >73mmHg   Acute hypoxemic respiratory failure - secondary to above  - PaO2 looks good on ABG. We will drop the FiO2 this morning tp 40%  - Goal sat >90%  - at risk for aspiration event in the field  with CPR, was started on abx at admission  - de-escalate to unasyn for 2 more days then stop   Possibility of aspiration being the trigger for his collapse -Monitor radiological changes -Continue current antibiotics  Head trauma with scalp injury - s/p fall, continue observe   DMII, hyperglycemia  - insulin ggt stopped  - CBGs q4H with SSI   Nutritional Support - TF protocol to start once of paralysis   Best practice:   Diet: N.p.o. Pain/Anxiety/Delirium protocol (if indicated): Fentanyl VAP protocol (if indicated): In place DVT prophylaxis: Heparin GI prophylaxis: Protonix Glucose control: SSI Mobility: Bedrest Code Status: Full code Family Communication: Discussed with spouse and son Disposition: Intensive care unit  Labs   CBC: Recent Labs  Lab 08/17/18 1532  08/17/18 1751 08/17/18 1951 08/17/18 2217 08/18/18 0220 08/18/18 0526 08/18/18 0553  WBC 18.0*  --  27.0*  --   --  17.5* 14.3*  --   HGB 11.9*   < > 13.7 13.3 12.9* 12.3* 12.1* 11.9*  HCT 39.2   < > 44.1 39.0 38.0* 38.4* 37.2* 35.0*  MCV 88.7  --  87.2  --   --  84.2 83.6  --   PLT 308  --  309  --   --  261 248  --    < > = values in this interval not displayed.    Basic Metabolic Panel: Recent Labs  Lab 08/17/18 2045 08/17/18 2053  08/17/18 2239 08/18/18 0017 08/18/18 0220 08/18/18 0526 08/18/18 0553  NA 139  --    < > 137 135 137 139 142  K 3.8  --    < > 4.3 4.1 3.9 3.2* 3.2*  CL 107  --   --  107 108 109 109  --   CO2 19*  --   --  19* 18* 19* 20*  --   GLUCOSE 229*  --   --  257* 303* 246* 133*  --   BUN 12  --   --  12 13 12 11   --   CREATININE 0.79  --   --  0.59* 0.87 0.73 0.68  --   CALCIUM 8.7*  --   --  8.3* 7.9* 8.0* 8.0*  --   MG  --  1.7  --   --   --   --  1.8  --   PHOS  --   --   --   --   --   --  1.7*  --    < > = values in this interval not displayed.   GFR: Estimated Creatinine Clearance: 82.4 mL/min (by C-G formula based on SCr of 0.68 mg/dL). Recent Labs  Lab 08/17/18 1532 08/17/18 1616 08/17/18 1751 08/18/18 0220 08/18/18 0526  WBC 18.0*  --  27.0* 17.5* 14.3*  LATICACIDVEN  --  6.7*  --   --   --     Liver Function Tests: Recent Labs  Lab 08/17/18 1532  AST 336*  ALT 257*  ALKPHOS 72  BILITOT 0.6  PROT 5.8*  ALBUMIN 3.2*   No results for input(s): LIPASE, AMYLASE in the last 168 hours. No results for input(s): AMMONIA in the last 168 hours.  ABG    Component Value  Date/Time   PHART 7.454 (H) 08/18/2018 0553   PCO2ART 29.1 (L) 08/18/2018 0553   PO2ART 192.0 (H) 08/18/2018 0553   HCO3 21.2 08/18/2018 0553   TCO2 22 08/18/2018 0553   ACIDBASEDEF 3.0 (H) 08/18/2018 2202  O2SAT 100.0 08/18/2018 0553     Coagulation Profile: Recent Labs  Lab 08/17/18 1532 08/17/18 1751 08/18/18 0220  INR 1.2 1.1 1.1    Cardiac Enzymes: Recent Labs  Lab 08/17/18 1532 08/17/18 1751 08/17/18 1957 08/18/18 0017 08/18/18 0526  TROPONINI 0.03* 0.26* 1.28* 2.07* 2.56*    HbA1C: No results found for: HGBA1C  CBG: Recent Labs  Lab 08/18/18 0244 08/18/18 0401 08/18/18 0508 08/18/18 0604 08/18/18 0702  GLUCAP 205* 165* 128* 117* 107*    This patient is critically ill with multiple organ system failure; which, requires frequent high complexity decision making, assessment, support, evaluation, and titration of therapies. This was completed through the application of advanced monitoring technologies and extensive interpretation of multiple databases. During this encounter critical care time was devoted to patient care services described in this note for 48 minutes.   Garner Nash, DO [facility] 08/18/2018 8:16 AM  Personal pager: [phone] If unanswered, please page CCM On-call: [phone]

## 2018-08-19 ENCOUNTER — Inpatient Hospital Stay (HOSPITAL_COMMUNITY): Payer: Medicare PPO

## 2018-08-19 DIAGNOSIS — I499 Cardiac arrhythmia, unspecified: Secondary | ICD-10-CM

## 2018-08-19 DIAGNOSIS — I469 Cardiac arrest, cause unspecified: Secondary | ICD-10-CM

## 2018-08-19 LAB — GLUCOSE, CAPILLARY
Glucose-Capillary: 143 mg/dL — ABNORMAL HIGH (ref 70–99)
Glucose-Capillary: 151 mg/dL — ABNORMAL HIGH (ref 70–99)
Glucose-Capillary: 232 mg/dL — ABNORMAL HIGH (ref 70–99)
Glucose-Capillary: 240 mg/dL — ABNORMAL HIGH (ref 70–99)
Glucose-Capillary: 166 mg/dL — ABNORMAL HIGH (ref 70–99)
Glucose-Capillary: 118 mg/dL — ABNORMAL HIGH (ref 70–99)
Glucose-Capillary: 73 mg/dL (ref 70–99)

## 2018-08-19 LAB — BASIC METABOLIC PANEL
Anion gap: 8 (ref 5–15)
BUN: 10 mg/dL (ref 8–23)
CO2: 18 mmol/L — ABNORMAL LOW (ref 22–32)
Calcium: 7.2 mg/dL — ABNORMAL LOW (ref 8.9–10.3)
Chloride: 115 mmol/L — ABNORMAL HIGH (ref 98–111)
Creatinine, Ser: 0.79 mg/dL (ref 0.61–1.24)
GFR calc Af Amer: >60 mL/min (ref >60)
GFR calc non Af Amer: >60 mL/min (ref >60)
Glucose, Bld: 249 mg/dL — ABNORMAL HIGH (ref 70–99)
Potassium: 4.1 mmol/L (ref 3.5–5.1)
Sodium: 141 mmol/L (ref 135–145)

## 2018-08-19 LAB — POCT I-STAT 7, (LYTES, BLD GAS, ICA,H+H)
Acid-base deficit: 8 mmol/L — ABNORMAL HIGH (ref 0.0–2.0)
Acid-base deficit: 5 mmol/L — ABNORMAL HIGH (ref 0.0–2.0)
Bicarbonate: 20.2 mmol/L (ref 20.0–28.0)
Bicarbonate: 21.8 mmol/L (ref 20.0–28.0)
Calcium, Ion: 1.22 mmol/L (ref 1.15–1.40)
Calcium, Ion: 1.18 mmol/L (ref 1.15–1.40)
HCT: 38 % — ABNORMAL LOW (ref 39.0–52.0)
HCT: 36 % — ABNORMAL LOW (ref 39.0–52.0)
Hemoglobin: 12.9 g/dL — ABNORMAL LOW (ref 13.0–17.0)
Hemoglobin: 12.2 g/dL — ABNORMAL LOW (ref 13.0–17.0)
O2 Saturation: 84 %
O2 Saturation: 94 %
Patient temperature: 37.3
Patient temperature: 37.3
Potassium: 4.9 mmol/L (ref 3.5–5.1)
Potassium: 4.8 mmol/L (ref 3.5–5.1)
Sodium: 144 mmol/L (ref 135–145)
Sodium: 145 mmol/L (ref 135–145)
TCO2: 22 mmol/L (ref 22–32)
TCO2: 23 mmol/L (ref 22–32)
pCO2 arterial: 52.3 mmHg — ABNORMAL HIGH (ref 32.0–48.0)
pCO2 arterial: 48.2 mmHg — ABNORMAL HIGH (ref 32.0–48.0)
pH, Arterial: 7.196 — CL (ref 7.350–7.450)
pH, Arterial: 7.266 — ABNORMAL LOW (ref 7.350–7.450)
pO2, Arterial: 62 mmHg — ABNORMAL LOW (ref 83.0–108.0)
pO2, Arterial: 85 mmHg (ref 83.0–108.0)

## 2018-08-19 LAB — MAGNESIUM
Magnesium: 2 mg/dL (ref 1.7–2.4)
Magnesium: 2.1 mg/dL (ref 1.7–2.4)

## 2018-08-19 LAB — PHOSPHORUS
Phosphorus: 1.7 mg/dL — ABNORMAL LOW (ref 2.5–4.6)
Phosphorus: 4.2 mg/dL (ref 2.5–4.6)

## 2018-08-19 LAB — ECHOCARDIOGRAM COMPLETE
Height: 71 in
Weight: 2839.52 oz

## 2018-08-19 LAB — COOXEMETRY PANEL
Carboxyhemoglobin: 1 % (ref 0.5–1.5)
Methemoglobin: 1.8 % — ABNORMAL HIGH (ref 0.0–1.5)
O2 Saturation: 73.4 %
Total hemoglobin: 11.3 g/dL — ABNORMAL LOW (ref 12.0–16.0)

## 2018-08-19 LAB — TROPONIN I: Troponin I: 2.03 ng/mL (ref <0.03)

## 2018-08-19 LAB — TRIGLYCERIDES: Triglycerides: 101 mg/dL (ref <150)

## 2018-08-19 MED ORDER — MIDAZOLAM BOLUS VIA INFUSION: 2.0000 mg | Freq: Once | INTRAVENOUS | Status: DC

## 2018-08-19 MED ORDER — METOPROLOL TARTRATE 5 MG/5ML IV SOLN
INTRAVENOUS | Status: AC
  Administered 2018-08-19: 5 mg
  Filled 2018-08-19: qty 5

## 2018-08-19 MED ORDER — SODIUM PHOSPHATES 45 MMOLE/15ML IV SOLN
30.0000 mmol | Freq: Once | INTRAVENOUS | Status: AC
  Administered 2018-08-19: 06:00:00 30 mmol via INTRAVENOUS
  Filled 2018-08-19: qty 10

## 2018-08-19 MED ORDER — ARTIFICIAL TEARS OPHTHALMIC OINT
1.0000 "application " | TOPICAL_OINTMENT | Freq: Three times a day (TID) | OPHTHALMIC | Status: DC
  Administered 2018-08-19 (×2): 1 via OPHTHALMIC

## 2018-08-19 MED ORDER — INSULIN ASPART 100 UNIT/ML ~~LOC~~ SOLN
2.0000 [IU] | SUBCUTANEOUS | Status: DC
  Administered 2018-08-19: 17:00:00 4 [IU] via SUBCUTANEOUS
  Administered 2018-08-19 – 2018-08-20 (×2): 6 [IU] via SUBCUTANEOUS
  Administered 2018-08-20 – 2018-08-21 (×4): 4 [IU] via SUBCUTANEOUS
  Administered 2018-08-21: 6 [IU] via SUBCUTANEOUS

## 2018-08-19 MED ORDER — ACETAMINOPHEN 160 MG/5ML PO SOLN
500.0000 mg | Freq: Four times a day (QID) | ORAL | Status: DC | PRN
  Administered 2018-08-19 – 2018-08-22 (×3): 500 mg
  Filled 2018-08-19 (×3): qty 20.3

## 2018-08-19 MED ORDER — PROPOFOL 1000 MG/100ML IV EMUL
10.0000 mL | INTRAVENOUS | Status: DC
  Administered 2018-08-19: 18:00:00 via INTRAVENOUS
  Filled 2018-08-19: qty 100

## 2018-08-19 MED ORDER — METOPROLOL TARTRATE 5 MG/5ML IV SOLN
2.5000 mg | INTRAVENOUS | Status: DC | PRN
  Administered 2018-08-20 (×2): 2.5 mg via INTRAVENOUS
  Administered 2018-08-20 – 2018-08-21 (×2): 5 mg via INTRAVENOUS
  Administered 2018-08-21 – 2018-08-22 (×2): 2.5 mg via INTRAVENOUS
  Filled 2018-08-19 (×6): qty 5

## 2018-08-19 MED ORDER — PROPOFOL 1000 MG/100ML IV EMUL
INTRAVENOUS | Status: AC
  Administered 2018-08-19: 18:00:00 via INTRAVENOUS
  Filled 2018-08-19: qty 100

## 2018-08-19 MED ORDER — PHENYLEPHRINE HCL-NACL 40-0.9 MG/250ML-% IV SOLN
0.0000 ug/min | INTRAVENOUS | Status: DC
  Administered 2018-08-19: 75 ug/min via INTRAVENOUS
  Administered 2018-08-20: 03:00:00 150 ug/min via INTRAVENOUS
  Filled 2018-08-19 (×3): qty 250

## 2018-08-19 NOTE — Progress Notes (Addendum)
NAME:  [name] [name], MRN:  [id number], DOB:  1941-03-10, LOS: 2 ADMISSION DATE:  08/17/2018, CONSULTATION DATE:  08/17/2018 REFERRING MD: ED provider, CHIEF COMPLAINT: Post cardiac arrest  Brief History   Middle-age gentleman with background history of diabetes Was feeling well up until today He did go to the store where he had a witnessed collapse, bystander noted complaints of fatigue and concern about his sugar He was attempting to eat a snack before he had his episode of collapse CPR initiated immediately ACLS crew arrived within 5 minutes Total of 11 minutes prior to [place] Required 2 shocks for V. Fib Comatose on presentation to the emergency department  History of present illness   Denies any complaints of fever, cough, shortness of breath prior to leaving for the store  Past Medical History  Diabetes-according to family well-controlled  Significant Hospital Events     Consults:  cardiology  Procedures:    Significant Diagnostic Tests:  CT head IMPRESSION: 1. No acute intracranial pathology. Small-vessel white matter disease. 2.  No fracture or static subluxation of the cervical spine.  Chest x-ray reviewed by myself showing mild atelectasis, possible left lower lobe infiltrate  EKG did not reveal any ST T wave changes  Micro Data:    Antimicrobials:  Azithromycin 08/17/2018>> Ceftriaxone 08/17/2018>>  Interim history/subjective:  Sedated and paralyzed. On rewarming protocol  Objective   Blood pressure (!) 154/54, pulse 92, temperature (!) 96.6 F (35.9 C), temperature source Core, resp. rate 16, height 5\' 11"  (1.803 m), weight 80.5 kg, SpO2 100 %. CVP:  [0 mmHg-9 mmHg] 6 mmHg  Vent Mode: PRVC FiO2 (%):  [40 %-60 %] 40 % Set Rate:  [14 bmp] 14 bmp Vt Set:  [600 mL] 600 mL PEEP:  [5 cmH20] 5 cmH20 Plateau Pressure:  [14 cmH20-17 cmH20] 14 cmH20   Intake/Output Summary (Last 24 hours) at 08/19/2018 0813 Last data filed at 08/19/2018 0700 Gross per 24  hour  Intake 2823.64 ml  Output 1735 ml  Net 1088.64 ml   Filed Weights   08/17/18 1532 08/18/18 0500 08/19/18 0500  Weight: 90.7 kg 79.7 kg 80.5 kg   Physical Exam: General: Elderly male, sedated and paralyzed on mechanical ventilation HENT: [place], AT, ETT in place Eyes: EOMI, no scleral icterus Respiratory: Clear to auscultation bilaterally.  No crackles, wheezing or rales Cardiovascular: RRR, -M/R/G, no JVD GI: BS+, soft, nontender Extremities:-Edema,-tenderness Neuro: Sedated GU: Foley in place  Resolved Hospital Problem list     Assessment & Plan:   Status-post cardiac arrest V. Fib cardiac arrest Elevated troponin  - started on hypothermia protocol 4/30 at 33 deg, targert 36deg for re-warming - cardiology consultation appreciated. Will plan on cath when patient improves - paralysis to stop once back to 36 deg  - discussed/coordinated care with RN - TTE ordered - Trend troponin until peaked -ASA, statin  Shock, hypotension   Vasodilatory, medication effect - patient on propofol and paralysis  - on sedation: fent + versed - paralysis being stopped once at 36 deg - Wean Neo to maintain MAP >17mmHg   Acute hypoxemic respiratory failure Secondary to above  - PaO2 looks good on ABG. We will drop the FiO2 this morning tp 40%  - Goal sat >90%  - at risk for aspiration event in the field with CPR, was started on abx at admission  - de-escalate to unasyn for 2 more days then stop   Hypophosphatemia - Replete  Possibility of aspiration being the trigger for his  collapse -Monitor radiological changes -Continue current antibiotics  Head trauma with scalp injury - s/p fall, continue observe  - EEG negative  DMII, hyperglycemia  - insulin ggt stopped  - CBGs q4H with SSI   Nutritional Support - Plan to start TF when paralysis DC'd  Best practice:  Diet: N.p.o. Pain/Anxiety/Delirium protocol (if indicated): Fentanyl, Versed VAP protocol (if indicated): In  place DVT prophylaxis: Heparin GI prophylaxis: Protonix Glucose control: SSI Mobility: Bedrest Code Status: Full code Family Communication: Called spouse and son. No answer. Left callback message. Disposition: Intensive care unit  Labs   CBC: Recent Labs  Lab 08/17/18 1532  08/17/18 1751 08/17/18 1951 08/17/18 2217 08/18/18 0220 08/18/18 0526 08/18/18 0553  WBC 18.0*  --  27.0*  --   --  17.5* 14.3*  --   HGB 11.9*   < > 13.7 13.3 12.9* 12.3* 12.1* 11.9*  HCT 39.2   < > 44.1 39.0 38.0* 38.4* 37.2* 35.0*  MCV 88.7  --  87.2  --   --  84.2 83.6  --   PLT 308  --  309  --   --  261 248  --    < > = values in this interval not displayed.    Basic Metabolic Panel: Recent Labs  Lab 08/17/18 2053  08/18/18 0017 08/18/18 0220 08/18/18 0526 08/18/18 4196 08/18/18 0856 08/18/18 1613 08/19/18 0209  NA  --    < > 135 137 139 142  --  141 141  K  --    < > 4.1 3.9 3.2* 3.2*  --  5.5* 4.1  CL  --    < > 108 109 109  --   --  112* 115*  CO2  --    < > 18* 19* 20*  --   --  18* 18*  GLUCOSE  --    < > 303* 246* 133*  --   --  180* 249*  BUN  --    < > 13 12 11   --   --  10 10  CREATININE  --    < > 0.87 0.73 0.68  --   --  1.01 0.79  CALCIUM  --    < > 7.9* 8.0* 8.0*  --   --  8.1* 7.2*  MG 1.7  --   --   --  1.8  --  1.8 2.3 2.0  PHOS  --   --   --   --  1.7*  --  1.8* 3.7 1.7*   < > = values in this interval not displayed.   GFR: Estimated Creatinine Clearance: 82.4 mL/min (by C-G formula based on SCr of 0.79 mg/dL). Recent Labs  Lab 08/17/18 1532 08/17/18 1616 08/17/18 1751 08/18/18 0220 08/18/18 0526  WBC 18.0*  --  27.0* 17.5* 14.3*  LATICACIDVEN  --  6.7*  --   --   --     Liver Function Tests: Recent Labs  Lab 08/17/18 1532  AST 336*  ALT 257*  ALKPHOS 72  BILITOT 0.6  PROT 5.8*  ALBUMIN 3.2*   No results for input(s): LIPASE, AMYLASE in the last 168 hours. No results for input(s): AMMONIA in the last 168 hours.  ABG    Component Value Date/Time    PHART 7.454 (H) 08/18/2018 0553   PCO2ART 29.1 (L) 08/18/2018 0553   PO2ART 192.0 (H) 08/18/2018 0553   HCO3 21.2 08/18/2018 0553   TCO2 22 08/18/2018 0553   ACIDBASEDEF 3.0 (  H) 08/18/2018 0553   O2SAT 100.0 08/18/2018 0553     Coagulation Profile: Recent Labs  Lab 08/17/18 1532 08/17/18 1751 08/18/18 0220 08/18/18 1613  INR 1.2 1.1 1.1 1.0    Cardiac Enzymes: Recent Labs  Lab 08/17/18 1751 08/17/18 1957 08/18/18 0017 08/18/18 0526 08/18/18 1224  TROPONINI 0.26* 1.28* 2.07* 2.56* 2.77*    HbA1C: No results found for: HGBA1C  CBG: Recent Labs  Lab 08/18/18 1136 08/18/18 1230 08/18/18 1958 08/19/18 0025 08/19/18 0449  GLUCAP 119* 146* 170* 143* 151*   The patient is critically ill with multiple organ systems failure and requires high complexity decision making for assessment and support, frequent evaluation and titration of therapies, application of advanced monitoring technologies and extensive interpretation of multiple databases.   Critical Care Time devoted to patient care services described in this note is 36 Minutes. This time reflects time of care of this signee Dr. Rodman Pickle. This critical care time does not reflect procedure time, or teaching time or supervisory time of PA/NP/Med student/Med Resident etc but could involve care discussion time.   [place], DO [facility] 08/19/2018 8:13 AM  Personal pager: [phone] If unanswered, please page CCM On-call: [phone]

## 2018-08-19 NOTE — Progress Notes (Addendum)
PCCM Interval Note At bedside, patient with rigors and low desaturations to 80% however poor waveform.   Plan: Increased FIO2 100% and PEEP 10 Versed bolus now STAT CXR ABG  Addendum: ABG reviewed. Increase PEEP to 12  Rodman Pickle, M.D. [facility] Pager: [phone] After hours pager: [phone]

## 2018-08-19 NOTE — Progress Notes (Addendum)
Cardiology Rounding Note   Subjective:    Currently rewarming. Remains intubated/sedated. EEG normal. Bck on low-dose neo. SBP 90s. CVP 5  Troponin 2.56    Objective:   Weight Range:  Vital Signs:   Temp:  [91.2 F (32.9 C)-98.2 F (36.8 C)] 96.6 F (35.9 C) (05/02 0700) Pulse Rate:  [36-141] 92 (05/02 0600) Resp:  [12-25] 16 (05/02 0600) BP: (91-171)/(46-92) 154/54 (05/02 0600) SpO2:  [93 %-100 %] 100 % (05/02 0600) Arterial Line BP: (87-141)/(44-72) 113/61 (05/02 0600) FiO2 (%):  [40 %-60 %] 40 % (05/02 0752) Weight:  [80.5 kg] 80.5 kg (05/02 0500) Last BM Date: 08/18/18  Weight change: Filed Weights   08/17/18 1532 08/18/18 0500 08/19/18 0500  Weight: 90.7 kg 79.7 kg 80.5 kg    Intake/Output:   Intake/Output Summary (Last 24 hours) at 08/19/2018 0755 Last data filed at 08/19/2018 0700 Gross per 24 hour  Intake 2912.47 ml  Output 1855 ml  Net 1057.47 ml     Physical Exam: General:  Intubated/sedated.  On cooling pads and warming blanket HEENT: normal + ETT. Hematoma on forehad Neck: supple. no JVD. Carotids 2+ bilat; no bruits. No lymphadenopathy or thryomegaly appreciated. Cor: PMI nondisplaced. Regular rate & rhythm. No rubs, gallops or murmurs. Lungs: clear Abdomen: soft, nontender, nondistended. No hepatosplenomegaly. No bruits or masses. Good bowel sounds. Extremities: no cyanosis, clubbing, rash, edema Neuro: intubated sedated    Telemetry: Sinus 80s dimunitive p waves. No VT/VF Personally reviewed    Labs: Basic Metabolic Panel: Recent Labs  Lab 08/17/18 2053  08/18/18 0017 08/18/18 0220 08/18/18 6812 08/18/18 7517 08/18/18 0856 08/18/18 1613 08/19/18 0209  NA  --    < > 135 137 139 142  --  141 141  K  --    < > 4.1 3.9 3.2* 3.2*  --  5.5* 4.1  CL  --    < > 108 109 109  --   --  112* 115*  CO2  --    < > 18* 19* 20*  --   --  18* 18*  GLUCOSE  --    < > 303* 246* 133*  --   --  180* 249*  BUN  --    < > '13 12 11  ' --   --  10 10    CREATININE  --    < > 0.87 0.73 0.68  --   --  1.01 0.79  CALCIUM  --    < > 7.9* 8.0* 8.0*  --   --  8.1* 7.2*  MG 1.7  --   --   --  1.8  --  1.8 2.3 2.0  PHOS  --   --   --   --  1.7*  --  1.8* 3.7 1.7*   < > = values in this interval not displayed.    Liver Function Tests: Recent Labs  Lab 08/17/18 1532  AST 336*  ALT 257*  ALKPHOS 72  BILITOT 0.6  PROT 5.8*  ALBUMIN 3.2*   No results for input(s): LIPASE, AMYLASE in the last 168 hours. No results for input(s): AMMONIA in the last 168 hours.  CBC: Recent Labs  Lab 08/17/18 1532  08/17/18 1751 08/17/18 1951 08/17/18 2217 08/18/18 0220 08/18/18 0526 08/18/18 0553  WBC 18.0*  --  27.0*  --   --  17.5* 14.3*  --   HGB 11.9*   < > 13.7 13.3 12.9* 12.3* 12.1* 11.9*  HCT 39.2   < >  44.1 39.0 38.0* 38.4* 37.2* 35.0*  MCV 88.7  --  87.2  --   --  84.2 83.6  --   PLT 308  --  309  --   --  261 248  --    < > = values in this interval not displayed.    Cardiac Enzymes: Recent Labs  Lab 08/17/18 1751 08/17/18 1957 08/18/18 0017 08/18/18 0526 08/18/18 1224  TROPONINI 0.26* 1.28* 2.07* 2.56* 2.77*    BNP: BNP (last 3 results) No results for input(s): BNP in the last 8760 hours.  ProBNP (last 3 results) No results for input(s): PROBNP in the last 8760 hours.    Other results:  Imaging: Ct Head Wo Contrast  Result Date: 08/17/2018 CLINICAL DATA:  Head injury, post CPR, unresponsive EXAM: CT HEAD WITHOUT CONTRAST CT CERVICAL SPINE WITHOUT CONTRAST TECHNIQUE: Multidetector CT imaging of the head and cervical spine was performed following the standard protocol without intravenous contrast. Multiplanar CT image reconstructions of the cervical spine were also generated. COMPARISON:  None. FINDINGS: CT HEAD FINDINGS Brain: No evidence of acute infarction, hemorrhage, hydrocephalus, extra-axial collection or mass lesion/mass effect. Periventricular white matter hypodensity. Vascular: No hyperdense vessel or unexpected  calcification. Skull: Normal. Negative for fracture or focal lesion. Sinuses/Orbits: No acute finding. Other: None. CT CERVICAL SPINE FINDINGS Alignment: Normal. Skull base and vertebrae: No acute fracture. No primary bone lesion or focal pathologic process. Soft tissues and spinal canal: No prevertebral fluid or swelling. No visible canal hematoma. Disc levels: Generally mild multilevel disc degenerative disease and osteophytosis. Upper chest: Negative. Other: Partially imaged endotracheal and orogastric tubes. IMPRESSION: 1. No acute intracranial pathology. Small-vessel white matter disease. 2.  No fracture or static subluxation of the cervical spine. Electronically Signed   By: Eddie Candle M.D.   On: 08/17/2018 16:51   Ct Cervical Spine Wo Contrast  Result Date: 08/17/2018 CLINICAL DATA:  Head injury, post CPR, unresponsive EXAM: CT HEAD WITHOUT CONTRAST CT CERVICAL SPINE WITHOUT CONTRAST TECHNIQUE: Multidetector CT imaging of the head and cervical spine was performed following the standard protocol without intravenous contrast. Multiplanar CT image reconstructions of the cervical spine were also generated. COMPARISON:  None. FINDINGS: CT HEAD FINDINGS Brain: No evidence of acute infarction, hemorrhage, hydrocephalus, extra-axial collection or mass lesion/mass effect. Periventricular white matter hypodensity. Vascular: No hyperdense vessel or unexpected calcification. Skull: Normal. Negative for fracture or focal lesion. Sinuses/Orbits: No acute finding. Other: None. CT CERVICAL SPINE FINDINGS Alignment: Normal. Skull base and vertebrae: No acute fracture. No primary bone lesion or focal pathologic process. Soft tissues and spinal canal: No prevertebral fluid or swelling. No visible canal hematoma. Disc levels: Generally mild multilevel disc degenerative disease and osteophytosis. Upper chest: Negative. Other: Partially imaged endotracheal and orogastric tubes. IMPRESSION: 1. No acute intracranial pathology.  Small-vessel white matter disease. 2.  No fracture or static subluxation of the cervical spine. Electronically Signed   By: Eddie Candle M.D.   On: 08/17/2018 16:51   Dg Chest Port 1 View  Result Date: 08/18/2018 CLINICAL DATA:  Cardiac arrest and respiratory failure. EXAM: PORTABLE CHEST 1 VIEW COMPARISON:  08/17/2018 FINDINGS: Endotracheal tube remains with the tip approximately 2 cm above the carina. Gastric decompression tube extends into the stomach. Lungs show improved aeration on the right with mild subsegmental atelectasis an a perihilar location. There is slight increase in left lower lobe atelectasis with potential component of a small left pleural effusion. No pulmonary edema or pneumothorax identified. No visible bony fractures. IMPRESSION: Improved  aeration of the right lung. Increase in left lower lobe atelectasis with potential small left pleural effusion. Electronically Signed   By: Aletta Edouard M.D.   On: 08/18/2018 07:40   Dg Chest Port 1 View  Result Date: 08/17/2018 CLINICAL DATA:  Intubation EXAM: PORTABLE CHEST 1 VIEW COMPARISON:  Portable exam 1545 hours without priors for comparison FINDINGS: Tip of endotracheal tube projects 3.4 cm above carina. Nasogastric tube extends into stomach. Normal heart size, mediastinal contours, and pulmonary vascularity. Atherosclerotic calcification aorta. Subsegmental atelectasis in RIGHT upper lobe and at LEFT base. Additional perihilar and LEFT lower lobe infiltrates. No pleural effusion or pneumothorax. IMPRESSION: BILATERAL pulmonary infiltrates and scattered atelectasis as above. Electronically Signed   By: Lavonia Dana M.D.   On: 08/17/2018 16:30   [place] Ekg Site Rite  Result Date: 08/17/2018 If Site Rite image not attached, placement could not be confirmed due to current cardiac rhythm.    Medications:     Scheduled Medications:  artificial tears  1 application Both Eyes J8A   aspirin  81 mg Per Tube Daily   atorvastatin  80 mg  Per Tube q1800   chlorhexidine gluconate (MEDLINE KIT)  15 mL Mouth Rinse BID   Chlorhexidine Gluconate Cloth  6 each Topical Daily   heparin  5,000 Units Subcutaneous Q8H   mouth rinse  15 mL Mouth Rinse 10 times per day   pantoprazole (PROTONIX) IV  40 mg Intravenous QHS    Infusions:  sodium chloride Stopped (08/18/18 1328)   sodium chloride Stopped (08/18/18 1317)   sodium chloride Stopped (08/19/18 0628)   ampicillin-sulbactam (UNASYN) IV Stopped (08/19/18 0132)   cisatracurium (NIMBEX) infusion Stopped (08/18/18 1040)   cisatracurium (NIMBEX) infusion 1.5 mcg/kg/min (08/19/18 0700)   feeding supplement (VITAL AF 1.2 CAL) 50 mL/hr at 08/19/18 0400   fentaNYL infusion INTRAVENOUS 200 mcg/hr (08/19/18 0720)   midazolam 2 mg/hr (08/19/18 0721)   norepinephrine (LEVOPHED) Adult infusion     phenylephrine (NEO-SYNEPHRINE) Adult infusion Stopped (08/19/18 0510)   sodium phosphate  Dextrose 5% IVPB 43 mL/hr at 08/19/18 0700    PRN Medications: Place/Maintain arterial line **AND** sodium chloride, sodium chloride, [DISCONTINUED] cisatracurium **AND** cisatracurium (NIMBEX) infusion **AND** cisatracurium, fentaNYL, fentaNYL (SUBLIMAZE) injection, fentaNYL (SUBLIMAZE) injection, ipratropium-albuterol, sodium chloride flush   Assessment:   [name] [name] is a 78 y.o. male with h/o DM2, HTN, HL admitted with VF arrest on 08/17/18   Plan/Discussion:    1. VFib Arrest: out of hospital VFib arrest w/ ROSC after 11 min of CPR + 1 round of Epi and 2 shocks. ED EKG shows afib w/ RVR. Pt intubated. - by history it sounds like hypoglycemia and possible aspiration may have triggered but initial rhythm was VF and not PEA thus suspect possibly ischemic verus primary VF in setting of undiagnosed underlying cardiomyopathy  - first troponin negative but subsequently 2.6. ECG without ischemic changes. Per bystander/EMS report no mention of preceding CP - rhythm stable today -  Continue supportive care and therapeutic hypothermia  - Keep K> 4.0, Mag > 2.0 - echo still pending - will need cath if/when recovers - CVP 5. Check co-ox   2. Elevated troponin - unclear if primary issue or demand ischemia - continue ASA. Start statin - Cath if/when recovers  3. Hypokalemia - improved  4. Hypomag - improved  5. Possible AF with RVR - very transient on arrival to ER. Now back in SR.  (p-waves very small and having a lot of PACs so hard  to assess at times) - with fall and head trauma would not start heparin at this point - echo pending  CRITICAL CARE Performed by: Glori Bickers  Total critical care time:35 minutes  Critical care time was exclusive of separately billable procedures and treating other patients.  Critical care was necessary to treat or prevent imminent or life-threatening deterioration.  Critical care was time spent personally by me (independent of midlevel providers or residents) on the following activities: development of treatment plan with patient and/or surrogate as well as nursing, discussions with consultants, evaluation of patient's response to treatment, examination of patient, obtaining history from patient or surrogate, ordering and performing treatments and interventions, ordering and review of laboratory studies, ordering and review of radiographic studies, pulse oximetry and re-evaluation of patient's condition.   Length of Stay: 2   Glori Bickers MD 08/19/2018, 7:55 AM  Advanced Heart Failure Team Pager [phone] (M-F; 7a - 4p)  Please contact [facility] for night-coverage after hours (4p -7a ) and weekends on [email or web address]

## 2018-08-19 NOTE — Progress Notes (Addendum)
CRITICAL VALUE ALERT  Critical Value:  Phosphorus 1.7  Date & Time Notied:  8867 08/19/18  Provider Notified: Warren Lacy Notified   Orders Received/Actions taken:  See new orders

## 2018-08-19 NOTE — Progress Notes (Signed)
  Echocardiogram 2D Echocardiogram has been performed.  [name] [name] 08/19/2018, 10:22 AM

## 2018-08-20 ENCOUNTER — Other Ambulatory Visit: Payer: Self-pay

## 2018-08-20 DIAGNOSIS — A419 Sepsis, unspecified organism: Secondary | ICD-10-CM

## 2018-08-20 DIAGNOSIS — R6521 Severe sepsis with septic shock: Secondary | ICD-10-CM

## 2018-08-20 LAB — BASIC METABOLIC PANEL
Anion gap: 11 (ref 5–15)
BUN: 15 mg/dL (ref 8–23)
CO2: 18 mmol/L — ABNORMAL LOW (ref 22–32)
Calcium: 8.2 mg/dL — ABNORMAL LOW (ref 8.9–10.3)
Chloride: 114 mmol/L — ABNORMAL HIGH (ref 98–111)
Creatinine, Ser: 1.22 mg/dL (ref 0.61–1.24)
GFR calc Af Amer: >60 mL/min (ref >60)
GFR calc non Af Amer: 57 mL/min — ABNORMAL LOW (ref >60)
Glucose, Bld: 128 mg/dL — ABNORMAL HIGH (ref 70–99)
Potassium: 4.4 mmol/L (ref 3.5–5.1)
Sodium: 143 mmol/L (ref 135–145)

## 2018-08-20 LAB — POCT I-STAT 7, (LYTES, BLD GAS, ICA,H+H)
Acid-base deficit: 8 mmol/L — ABNORMAL HIGH (ref 0.0–2.0)
Acid-base deficit: 8 mmol/L — ABNORMAL HIGH (ref 0.0–2.0)
Bicarbonate: 18 mmol/L — ABNORMAL LOW (ref 20.0–28.0)
Bicarbonate: 17.4 mmol/L — ABNORMAL LOW (ref 20.0–28.0)
Calcium, Ion: 1.17 mmol/L (ref 1.15–1.40)
Calcium, Ion: 1.12 mmol/L — ABNORMAL LOW (ref 1.15–1.40)
HCT: 34 % — ABNORMAL LOW (ref 39.0–52.0)
HCT: 33 % — ABNORMAL LOW (ref 39.0–52.0)
Hemoglobin: 11.6 g/dL — ABNORMAL LOW (ref 13.0–17.0)
Hemoglobin: 11.2 g/dL — ABNORMAL LOW (ref 13.0–17.0)
O2 Saturation: 95 %
O2 Saturation: 96 %
Patient temperature: 37
Potassium: 4.3 mmol/L (ref 3.5–5.1)
Potassium: 5.9 mmol/L — ABNORMAL HIGH (ref 3.5–5.1)
Sodium: 144 mmol/L (ref 135–145)
Sodium: 141 mmol/L (ref 135–145)
TCO2: 19 mmol/L — ABNORMAL LOW (ref 22–32)
TCO2: 19 mmol/L — ABNORMAL LOW (ref 22–32)
pCO2 arterial: 38.4 mmHg (ref 32.0–48.0)
pCO2 arterial: 36 mmHg (ref 32.0–48.0)
pH, Arterial: 7.28 — ABNORMAL LOW (ref 7.350–7.450)
pH, Arterial: 7.293 — ABNORMAL LOW (ref 7.350–7.450)
pO2, Arterial: 86 mmHg (ref 83.0–108.0)
pO2, Arterial: 91 mmHg (ref 83.0–108.0)

## 2018-08-20 LAB — COOXEMETRY PANEL
Carboxyhemoglobin: 0.6 % (ref 0.5–1.5)
Methemoglobin: 1.9 % — ABNORMAL HIGH (ref 0.0–1.5)
O2 Saturation: 67.6 %
Total hemoglobin: 11.6 g/dL — ABNORMAL LOW (ref 12.0–16.0)

## 2018-08-20 LAB — CBC
HCT: 40.3 % (ref 39.0–52.0)
Hemoglobin: 12.6 g/dL — ABNORMAL LOW (ref 13.0–17.0)
MCH: 27.2 pg (ref 26.0–34.0)
MCHC: 31.3 g/dL (ref 30.0–36.0)
MCV: 86.9 fL (ref 80.0–100.0)
Platelets: 291 10*3/uL (ref 150–400)
RBC: 4.64 MIL/uL (ref 4.22–5.81)
RDW: 14.6 % (ref 11.5–15.5)
WBC: 19.6 10*3/uL — ABNORMAL HIGH (ref 4.0–10.5)
nRBC: 0 % (ref 0.0–0.2)

## 2018-08-20 LAB — GLUCOSE, CAPILLARY
Glucose-Capillary: 100 mg/dL — ABNORMAL HIGH (ref 70–99)
Glucose-Capillary: 101 mg/dL — ABNORMAL HIGH (ref 70–99)
Glucose-Capillary: 120 mg/dL — ABNORMAL HIGH (ref 70–99)
Glucose-Capillary: 153 mg/dL — ABNORMAL HIGH (ref 70–99)
Glucose-Capillary: 176 mg/dL — ABNORMAL HIGH (ref 70–99)
Glucose-Capillary: 228 mg/dL — ABNORMAL HIGH (ref 70–99)

## 2018-08-20 LAB — LACTIC ACID, PLASMA
Lactic Acid, Venous: 1.4 mmol/L (ref 0.5–1.9)
Lactic Acid, Venous: 1.4 mmol/L (ref 0.5–1.9)

## 2018-08-20 LAB — PROCALCITONIN: Procalcitonin: 12.97 ng/mL

## 2018-08-20 MED ORDER — FUROSEMIDE 10 MG/ML IJ SOLN
60.0000 mg | Freq: Two times a day (BID) | INTRAMUSCULAR | Status: AC
  Administered 2018-08-20: 60 mg via INTRAVENOUS
  Filled 2018-08-20: qty 6

## 2018-08-20 MED ORDER — VASOPRESSIN 20 UNIT/ML IV SOLN
0.0300 [IU]/min | INTRAVENOUS | Status: DC
  Administered 2018-08-20: 0.03 [IU]/min via INTRAVENOUS
  Filled 2018-08-20: qty 2

## 2018-08-20 MED ORDER — CHLORHEXIDINE GLUCONATE 0.12 % MT SOLN
OROMUCOSAL | Status: AC
  Administered 2018-08-20: 21:00:00 15 mL via OROMUCOSAL
  Filled 2018-08-20: qty 15

## 2018-08-20 MED ORDER — LACTATED RINGERS IV BOLUS
500.0000 mL | Freq: Once | INTRAVENOUS | Status: AC
  Administered 2018-08-20: 17:00:00 500 mL via INTRAVENOUS

## 2018-08-20 MED ORDER — SODIUM POLYSTYRENE SULFONATE 15 GM/60ML PO SUSP
15.0000 g | Freq: Once | ORAL | Status: AC
  Administered 2018-08-20: 15 g via ORAL
  Filled 2018-08-20: qty 60

## 2018-08-20 MED ORDER — VANCOMYCIN HCL IN DEXTROSE 750-5 MG/150ML-% IV SOLN
750.0000 mg | Freq: Two times a day (BID) | INTRAVENOUS | Status: DC
  Administered 2018-08-21 – 2018-08-22 (×3): 750 mg via INTRAVENOUS
  Filled 2018-08-20 (×3): qty 150

## 2018-08-20 MED ORDER — PIPERACILLIN-TAZOBACTAM 3.375 G IVPB
3.3750 g | Freq: Three times a day (TID) | INTRAVENOUS | Status: DC
  Administered 2018-08-20 – 2018-08-22 (×7): 3.375 g via INTRAVENOUS
  Filled 2018-08-20 (×7): qty 50

## 2018-08-20 MED ORDER — VANCOMYCIN HCL IN DEXTROSE 1-5 GM/200ML-% IV SOLN
1000.0000 mg | Freq: Once | INTRAVENOUS | Status: AC
  Administered 2018-08-20: 1000 mg via INTRAVENOUS
  Filled 2018-08-20: qty 200

## 2018-08-20 MED ORDER — PROPOFOL BOLUS VIA INFUSION: 10.0000 mg | INTRAVENOUS | Status: DC

## 2018-08-20 MED ORDER — PROPOFOL 1000 MG/100ML IV EMUL
20.0000 ug/kg/min | INTRAVENOUS | Status: DC
  Administered 2018-08-20 (×2): 20 ug/kg/min via INTRAVENOUS
  Filled 2018-08-20 (×2): qty 100

## 2018-08-20 MED ORDER — PROPOFOL 1000 MG/100ML IV EMUL: 5.0000 ug/kg/min | INTRAVENOUS | Status: DC

## 2018-08-20 MED ORDER — NOREPINEPHRINE 4 MG/250ML-% IV SOLN
0.0000 ug/min | INTRAVENOUS | Status: DC
  Administered 2018-08-20: 16 ug/min via INTRAVENOUS
  Administered 2018-08-20: 12 ug/min via INTRAVENOUS
  Administered 2018-08-20: 08:00:00 2 ug/min via INTRAVENOUS
  Filled 2018-08-20 (×3): qty 250

## 2018-08-20 NOTE — Progress Notes (Signed)
° ° °Cardiology Rounding Note ° ° °Subjective:   ° °Now rewarmed and was following commands. Had episode of emesis and rigors yesterday. Now sedated again.  On NE at 2. Co-ox 68% CVP 9-10 ° °Remains acidotic with large Aa gradient on 100% FiO2. CXR with mild edema.  ° °Echo with EF 55-60% ° °Troponin 2.56  ° ° °Objective:   °Weight Range: ° °Vital Signs:   °Temp:  [97.3 °F (36.3 °C)-100 °F (37.8 °C)] 98.2 °F (36.8 °C) (05/03 0700) °Pulse Rate:  [81-160] 84 (05/03 0700) °Resp:  [12-32] 20 (05/03 0700) °BP: (90-182)/(38-93) 102/67 (05/03 0500) °SpO2:  [88 %-100 %] 97 % (05/03 0700) °Arterial Line BP: (76-162)/(43-84) 93/54 (05/03 0700) °FiO2 (%):  [40 %-100 %] 100 % (05/03 0222) °Weight:  [79.1 kg] 79.1 kg (05/03 0600) °Last BM Date: 08/07/18 ° °Weight change: °Filed Weights  ° 08/18/18 0500 08/19/18 0500 08/20/18 0600  °Weight: 79.7 kg 80.5 kg 79.1 kg  ° ° °Intake/Output:  ° °Intake/Output Summary (Last 24 hours) at 08/20/2018 0815 °Last data filed at 08/20/2018 0700 °Gross per 24 hour  °Intake 2879.1 ml  °Output 1615 ml  °Net 1264.1 ml  °  ° °Physical Exam: °General:  Intubated sedated.  °HEENT: normal +ETT  Hematoma on forehead °Neck: supple. no JVD. Carotids 2+ bilat; no bruits. No lymphadenopathy or thryomegaly appreciated. °Cor: PMI nondisplaced. Regular rate & rhythm. No rubs, gallops or murmurs. °Lungs: clear °Abdomen: soft, nontender, nondistended. No hepatosplenomegaly. No bruits or masses. Good bowel sounds. °Extremities: no cyanosis, clubbing, rash, edema °Neuro:intubated sedated  ° ° °Telemetry: Sinus 80s dimunitive p waves. No VT/VF No AF Personally reviewed  ° ° °Labs: °Basic Metabolic Panel: °Recent Labs  °Lab 08/18/18 °0220 08/18/18 °0526  08/18/18 °0856 08/18/18 °1613 08/19/18 °0209 08/19/18 °1749 08/19/18 °1803 08/19/18 °1904 08/20/18 °0348 08/20/18 °0745  °NA 137 139   < >  --  141 141 144  --  145 143 144  °K 3.9 3.2*   < >  --  5.5* 4.1 4.9  --  4.8 4.4 4.3  °CL 109 109  --   --  112* 115*  --   --    --  114*  --   °CO2 19* 20*  --   --  18* 18*  --   --   --  18*  --   °GLUCOSE 246* 133*  --   --  180* 249*  --   --   --  128*  --   °BUN 12 11  --   --  10 10  --   --   --  15  --   °CREATININE 0.73 0.68  --   --  1.01 0.79  --   --   --  1.22  --   °CALCIUM 8.0* 8.0*  --   --  8.1* 7.2*  --   --   --  8.2*  --   °MG  --  1.8  --  1.8 2.3 2.0  --  2.1  --   --   --   °PHOS  --  1.7*  --  1.8* 3.7 1.7*  --  4.2  --   --   --   ° < > = values in this interval not displayed.  ° ° °Liver Function Tests: °Recent Labs  °Lab 08/17/18 °1532  °AST 336*  °ALT 257*  °ALKPHOS 72  °BILITOT 0.6  °PROT 5.8*  °ALBUMIN 3.2*  ° °No results   for input(s): LIPASE, AMYLASE in the last 168 hours. °No results for input(s): AMMONIA in the last 168 hours. ° °CBC: °Recent Labs  °Lab 08/17/18 °1532  08/17/18 °1751  08/18/18 °0220 08/18/18 °0526 08/18/18 °0553 08/19/18 °1749 08/19/18 °1904 08/20/18 °0348 08/20/18 °0745  °WBC 18.0*  --  27.0*  --  17.5* 14.3*  --   --   --  19.6*  --   °HGB 11.9*   < > 13.7   < > 12.3* 12.1* 11.9* 12.9* 12.2* 12.6* 11.6*  °HCT 39.2   < > 44.1   < > 38.4* 37.2* 35.0* 38.0* 36.0* 40.3 34.0*  °MCV 88.7  --  87.2  --  84.2 83.6  --   --   --  86.9  --   °PLT 308  --  309  --  261 248  --   --   --  291  --   ° < > = values in this interval not displayed.  ° ° °Cardiac Enzymes: °Recent Labs  °Lab 08/17/18 °1957 08/18/18 °0017 08/18/18 °0526 08/18/18 °1224 08/19/18 °0824  °TROPONINI 1.28* 2.07* 2.56* 2.77* 2.03*  ° ° °BNP: °BNP (last 3 results) °No results for input(s): BNP in the last 8760 hours. ° °ProBNP (last 3 results) °No results for input(s): PROBNP in the last 8760 hours. ° ° ° °Other results: ° °Imaging: °Dg Chest 1 View ° °Result Date: 08/19/2018 °CLINICAL DATA:  Hypoxemia EXAM: CHEST  1 VIEW COMPARISON:  08/18/2018 FINDINGS: Perihilar and lower lobe airspace opacities, likely edema. Low lung volumes. Small left pleural effusion. Heart is borderline in size. Endotracheal tube and NG tube are unchanged.  IMPRESSION: Perihilar and lower lobe opacities, likely edema. Small left effusion. Electronically Signed   By: Kevin  Dover M.D.   On: 08/19/2018 19:50  ° ° ° °Medications:   ° ° °Scheduled Medications: °• aspirin  81 mg Per Tube Daily  °• atorvastatin  80 mg Per Tube q1800  °• chlorhexidine gluconate (MEDLINE KIT)  15 mL Mouth Rinse BID  °• Chlorhexidine Gluconate Cloth  6 each Topical Daily  °• heparin  5,000 Units Subcutaneous Q8H  °• insulin aspart  2-6 Units Subcutaneous Q4H  °• mouth rinse  15 mL Mouth Rinse 10 times per day  °• midazolam  2 mg Intravenous Once  °• pantoprazole (PROTONIX) IV  40 mg Intravenous QHS  ° ° °Infusions: °• sodium chloride Stopped (08/18/18 1328)  °• sodium chloride Stopped (08/19/18 1612)  °• feeding supplement (VITAL AF 1.2 CAL) Stopped (08/19/18 2050)  °• fentaNYL infusion INTRAVENOUS 250 mcg/hr (08/20/18 0700)  °• midazolam 1 mg/hr (08/20/18 0700)  °• norepinephrine (LEVOPHED) Adult infusion 2 mcg/min (08/20/18 0809)  °• piperacillin-tazobactam (ZOSYN)  IV    ° ° °PRN Medications: °Place/Maintain arterial line **AND** sodium chloride, acetaminophen (TYLENOL) oral liquid 160 mg/5 mL, fentaNYL, fentaNYL (SUBLIMAZE) injection, ipratropium-albuterol, metoprolol tartrate, sodium chloride flush ° ° °Assessment:  ° °[name] [name] is a 78 y.o. male with h/o DM2, HTN, HL admitted with VF arrest on 08/17/18 °  ° °Plan/Discussion:   ° °1. VFib Arrest: out of hospital VFib arrest w/ ROSC after 11 min of CPR + 1 round of Epi and 2 shocks. ED EKG shows afib w/ RVR. Pt intubated. °- by history it sounds like hypoglycemia and possible aspiration may have triggered but initial rhythm was VF and not PEA thus suspect possibly ischemic  °- Echo with EF 55-60% so unlikely to be primary VT due to prior cardiomyopathy °-   first troponin negative but subsequently 2.6. ECG without ischemic changes. Per bystander/EMS report no mention of preceding CP - rhythm remains stable today - Continue supportive  care and therapeutic hypothermia  - Keep K> 4.0, Mag > 2.0 - will need cath if/when recovers - CVP 9-10. Co-ox 68%  2. Elevated troponin - unclear if primary issue or demand ischemia - continue ASA. Start statin - Cath if/when recovers  3. Hypokalemia - improved  4. Hypomag - improved  5. Possible AF with RVR - very transient on arrival to ER. Now back in SR.  (p-waves very small and having a lot of PACs so hard to assess at times) - with fall and head trauma would not start heparin at this point - echo pending  6. Acute hypoxemic respiratory failure - remains intubated with high O2 requirement and persistent acidosis - CXR looks like HF. Will diurese with lasix 60 IV bid - remains on Zosyn. Check PCT and sputum cx - CCM managing  CRITICAL CARE Performed by: Glori Bickers  Total critical care time: 40 minutes  Critical care time was exclusive of separately billable procedures and treating other patients.  Critical care was necessary to treat or prevent imminent or life-threatening deterioration.  Critical care was time spent personally by me (independent of midlevel providers or residents) on the following activities: development of treatment plan with patient and/or surrogate as well as nursing, discussions with consultants, evaluation of patient's response to treatment, examination of patient, obtaining history from patient or surrogate, ordering and performing treatments and interventions, ordering and review of laboratory studies, ordering and review of radiographic studies, pulse oximetry and re-evaluation of patient's condition.   Length of Stay: 3   Glori Bickers MD 08/20/2018, 8:15 AM  Advanced Heart Failure Team Pager [phone] (M-F; [place])  Please contact [facility] for night-coverage after hours (4p -7a ) and weekends on [email or web address]

## 2018-08-20 NOTE — Plan of Care (Signed)
  Problem: Education: Goal: Ability to manage disease process will improve Outcome: Progressing   Problem: Cardiac: Goal: Ability to achieve and maintain adequate cardiopulmonary perfusion will improve Outcome: Progressing   Problem: Neurologic: Goal: Promote progressive neurologic recovery Outcome: Progressing   Problem: Skin Integrity: Goal: Risk for impaired skin integrity will be minimized. Outcome: Progressing   Problem: Clinical Measurements: Goal: Will remain free from infection Outcome: Progressing Goal: Diagnostic test results will improve Outcome: Progressing Goal: Cardiovascular complication will be avoided Outcome: Progressing   Problem: Activity: Goal: Risk for activity intolerance will decrease Outcome: Progressing   Problem: Safety: Goal: Ability to remain free from injury will improve Outcome: Progressing   Problem: Skin Integrity: Goal: Risk for impaired skin integrity will decrease Outcome: Progressing

## 2018-08-20 NOTE — Progress Notes (Signed)
NAME:  [name] [name], MRN:  [id number], DOB:  1940/04/24, LOS: 3 ADMISSION DATE:  08/17/2018, CONSULTATION DATE:  08/17/2018 REFERRING MD: ED provider, CHIEF COMPLAINT: Post cardiac arrest  Brief History   78 year old male with DM who had a witnessed collapse. Bystander reported complaints about fatigue and concern about his sugar. CPR initiated immediately. ACLS crew arrived within 5 minutes. Total of 11 minutes prior to ROSC. Required 2 shocks for V. Fib Comatose on presentation to the emergency department  History of present illness   Denies any complaints of fever, cough, shortness of breath prior to leaving for the store  Past Medical History  Diabetes-according to family well-controlled  Significant Hospital Events     Consults:  cardiology  Procedures:    Significant Diagnostic Tests:  CT head -  No acute intracranial pathology.  EKG did not reveal any ST T wave changes TTE 5/3 - EF 55-60%. Impaired relaxation.  Micro Data:  4/30 [place] - NGTD, pending 5/3 BCX  5/3 Trach asp  Antimicrobials:  Azithromycin 08/17/2018 once Ceftriaxone 08/17/2018 once Unasyn 5/1>>  Interim history/subjective:  After rewarming, patient able to follow commands.  Worsening hypoxemia associated with rigors yesterday. Sedated this morning with no further episodes of emesis. Did require restarting of neo for hypotension. Episode of emesis so RN held TF.   Objective   Blood pressure 102/67, pulse 84, temperature 98.2 F (36.8 C), temperature source Bladder, resp. rate 20, height 5\' 11"  (1.803 m), weight 79.1 kg, SpO2 97 %. CVP:  [4 mmHg-18 mmHg] 11 mmHg  Vent Mode: PRVC FiO2 (%):  [40 %-100 %] 100 % Set Rate:  [14 bmp-20 bmp] 20 bmp Vt Set:  [600 mL] 600 mL PEEP:  [5 cmH20-12 cmH20] 12 cmH20 Plateau Pressure:  [14 cmH20-21 cmH20] 21 cmH20   Intake/Output Summary (Last 24 hours) at 08/20/2018 0731 Last data filed at 08/20/2018 0700 Gross per 24 hour  Intake 2966.81 ml  Output 1740 ml   Net 1226.81 ml   Filed Weights   08/18/18 0500 08/19/18 0500 08/20/18 0600  Weight: 79.7 kg 80.5 kg 79.1 kg   Physical Exam: General: Elderly male, sedated, on mechanical ventilation HENT: [place], AT, ETT in place Eyes: EOMI, no scleral icterus Respiratory: Diminished breath sounds bilaterally.  No crackles, wheezing or rales Cardiovascular: RRR, -M/R/G, no JVD GI: BS+, soft, nontender Extremities: Non-pitting lower extremity edema,-tenderness Neuro: Sedated GU: Foley in place  CXR 5/3 perihilar opacities. Reviewed imaging personally be me.  Resolved Hospital Problem list     Assessment & Plan:   Status-post cardiac arrest V. Fib cardiac arrest Peak troponin 2.77. S/p hypothermia protocol on 4/30. TTE with normal EF. - Cardiology consultation appreciated. Will plan on cath when patient improves - ASA, statin  Acute hypoxemic respiratory failure Concern for aspiration pneumonitis/pneumonia and pulmonary edema. - PRVC 8cc/kg - Reviewed ABG. Titrate PEEP/FIO2 for goal SpO2 88-95% or pO2 55-80 - Broaden antibiotics to Zosyn - Holding diuresis in setting of hypotension  Septic shock secondary to pneumonia - Transition from neo to levo - MAP goal >65 - Obtain up blood and trach aspirate cultures - Antibiotics as above - LA  Head trauma with scalp injury Follows commands on WUA - s/p fall, continue observe  - EEG negative  DMII, hyperglycemia  - CBGs q4H with SSI   Nutritional Support - Holding d/t emesis  Best practice:  Diet: N.p.o. Pain/Anxiety/Delirium protocol (if indicated): Fentanyl, Versed VAP protocol (if indicated): Yes DVT prophylaxis: Heparin GI prophylaxis:  Protonix Glucose control: SSI Mobility: Bedrest Code Status: Full code Family Communication: Updated son on 5/3 regarding patient's worsening clinical status as noted above. He will update his mother/wife of patient. Disposition: ICU  Labs   CBC: Recent Labs  Lab 08/17/18 1532  08/17/18  1751  08/18/18 0220 08/18/18 0526 08/18/18 0553 08/19/18 1749 08/19/18 1904 08/20/18 0348  WBC 18.0*  --  27.0*  --  17.5* 14.3*  --   --   --  19.6*  HGB 11.9*   < > 13.7   < > 12.3* 12.1* 11.9* 12.9* 12.2* 12.6*  HCT 39.2   < > 44.1   < > 38.4* 37.2* 35.0* 38.0* 36.0* 40.3  MCV 88.7  --  87.2  --  84.2 83.6  --   --   --  86.9  PLT 308  --  309  --  261 248  --   --   --  291   < > = values in this interval not displayed.    Basic Metabolic Panel: Recent Labs  Lab 08/18/18 0220 08/18/18 0526  08/18/18 0856 08/18/18 1613 08/19/18 0209 08/19/18 1749 08/19/18 1803 08/19/18 1904 08/20/18 0348  NA 137 139   < >  --  141 141 144  --  145 143  K 3.9 3.2*   < >  --  5.5* 4.1 4.9  --  4.8 4.4  CL 109 109  --   --  112* 115*  --   --   --  114*  CO2 19* 20*  --   --  18* 18*  --   --   --  18*  GLUCOSE 246* 133*  --   --  180* 249*  --   --   --  128*  BUN 12 11  --   --  10 10  --   --   --  15  CREATININE 0.73 0.68  --   --  1.01 0.79  --   --   --  1.22  CALCIUM 8.0* 8.0*  --   --  8.1* 7.2*  --   --   --  8.2*  MG  --  1.8  --  1.8 2.3 2.0  --  2.1  --   --   PHOS  --  1.7*  --  1.8* 3.7 1.7*  --  4.2  --   --    < > = values in this interval not displayed.   GFR: Estimated Creatinine Clearance: 54 mL/min (by C-G formula based on SCr of 1.22 mg/dL). Recent Labs  Lab 08/17/18 1616 08/17/18 1751 08/18/18 0220 08/18/18 0526 08/20/18 0348  WBC  --  27.0* 17.5* 14.3* 19.6*  LATICACIDVEN 6.7*  --   --   --   --     Liver Function Tests: Recent Labs  Lab 08/17/18 1532  AST 336*  ALT 257*  ALKPHOS 72  BILITOT 0.6  PROT 5.8*  ALBUMIN 3.2*   No results for input(s): LIPASE, AMYLASE in the last 168 hours. No results for input(s): AMMONIA in the last 168 hours.  ABG    Component Value Date/Time   PHART 7.266 (L) 08/19/2018 1904   PCO2ART 48.2 (H) 08/19/2018 1904   PO2ART 85.0 08/19/2018 1904   HCO3 21.8 08/19/2018 1904   TCO2 23 08/19/2018 1904   ACIDBASEDEF  5.0 (H) 08/19/2018 1904   O2SAT 67.6 08/20/2018 0405     Coagulation Profile: Recent Labs  Lab  08/17/18 1532 08/17/18 1751 08/18/18 0220 08/18/18 1613  INR 1.2 1.1 1.1 1.0    Cardiac Enzymes: Recent Labs  Lab 08/17/18 1957 08/18/18 0017 08/18/18 0526 08/18/18 1224 08/19/18 0824  TROPONINI 1.28* 2.07* 2.56* 2.77* 2.03*    HbA1C: No results found for: HGBA1C  CBG: Recent Labs  Lab 08/19/18 1154 08/19/18 1625 08/19/18 1954 08/19/18 2311 08/20/18 0355  GLUCAP 240* 166* 118* 73 120*    The patient is critically ill with multiple organ systems failure and requires high complexity decision making for assessment and support, frequent evaluation and titration of therapies, application of advanced monitoring technologies and extensive interpretation of multiple databases.   Critical Care Time devoted to patient care services described in this note is 40 Minutes. This time reflects time of care of this signee Dr. Rodman Pickle. This critical care time does not reflect procedure time, or teaching time or supervisory time of PA/NP/Med student/Med Resident etc but could involve care discussion time.  Rodman Pickle, M.D. [facility] Pulmonary/Critical Care Medicine Pager: [phone] After hours pager: [phone]

## 2018-08-20 NOTE — Progress Notes (Signed)
Pharmacy Antibiotic Note  [name] [name] is a 78 y.o. male admitted on 08/17/2018 s/p cardiac arrest, s./p TTM, now with increasing wbc 19, inc PCT 13 and worsening Cxr looking like pna. Patient was previously on unasyn Pharmacy has been consulted for vancomycin and zosyn dosing. Cr 1.22 AUC goal 500  Plan: Vancomycin 1gm IV x1 then 750mg  q12h   Height: 5\' 11"  (180.3 cm) Weight: 174 lb 6.1 oz (79.1 kg) IBW/kg (Calculated) : 75.3  Temp (24hrs), Avg:98.4 F (36.9 C), Min:97.5 F (36.4 C), Max:99 F (37.2 C)  Recent Labs  Lab 08/17/18 1532 08/17/18 1616 08/17/18 1751  08/18/18 0220 08/18/18 0526 08/18/18 1613 08/19/18 0209 08/20/18 0348 08/20/18 0802 08/20/18 1045  WBC 18.0*  --  27.0*  --  17.5* 14.3*  --   --  19.6*  --   --   CREATININE 1.32*  --  0.98   < > 0.73 0.68 1.01 0.79 1.22  --   --   LATICACIDVEN  --  6.7*  --   --   --   --   --   --   --  1.4 1.4   < > = values in this interval not displayed.    Estimated Creatinine Clearance: 54 mL/min (by C-G formula based on SCr of 1.22 mg/dL).    Not on File  Antimicrobials this admission: unasyn 4/30>5/3 Dose adjustments this admission:  Microbiology results: 5/3  BCx:  5/3 Sputum: GNR 4/30 MRSA PCR: neg  Bonnita Nasuti Pharm.D. CPP, BCPS Clinical Pharmacist [phone] 08/20/2018 4:41 PM

## 2018-08-20 NOTE — Progress Notes (Addendum)
  D/w Dr. Loanne Drilling on CCM service. Patient with progressive pressor requirements. CVP 7 range. Suspect developing septic shock due to aspiration. PCT 13.  Will give IVF and add vasopressin per Dr. Loanne Drilling.  Stop lasix.   Continue unasyn for possible aspiration. May need to expand.   Glori Bickers, MD  4:24 PM

## 2018-08-21 ENCOUNTER — Inpatient Hospital Stay (HOSPITAL_COMMUNITY): Payer: Medicare PPO

## 2018-08-21 DIAGNOSIS — J189 Pneumonia, unspecified organism: Secondary | ICD-10-CM

## 2018-08-21 LAB — GLUCOSE, CAPILLARY
Glucose-Capillary: 110 mg/dL — ABNORMAL HIGH (ref 70–99)
Glucose-Capillary: 110 mg/dL — ABNORMAL HIGH (ref 70–99)
Glucose-Capillary: 124 mg/dL — ABNORMAL HIGH (ref 70–99)
Glucose-Capillary: 160 mg/dL — ABNORMAL HIGH (ref 70–99)
Glucose-Capillary: 193 mg/dL — ABNORMAL HIGH (ref 70–99)
Glucose-Capillary: 209 mg/dL — ABNORMAL HIGH (ref 70–99)
Glucose-Capillary: 70 mg/dL (ref 70–99)

## 2018-08-21 LAB — TRIGLYCERIDES: Triglycerides: 113 mg/dL (ref <150)

## 2018-08-21 LAB — CBC
HCT: 33.1 % — ABNORMAL LOW (ref 39.0–52.0)
Hemoglobin: 10.5 g/dL — ABNORMAL LOW (ref 13.0–17.0)
MCH: 27.1 pg (ref 26.0–34.0)
MCHC: 31.7 g/dL (ref 30.0–36.0)
MCV: 85.5 fL (ref 80.0–100.0)
Platelets: 197 10*3/uL (ref 150–400)
RBC: 3.87 MIL/uL — ABNORMAL LOW (ref 4.22–5.81)
RDW: 14.9 % (ref 11.5–15.5)
WBC: 10.3 10*3/uL (ref 4.0–10.5)
nRBC: 0 % (ref 0.0–0.2)

## 2018-08-21 LAB — BASIC METABOLIC PANEL
Anion gap: 8 (ref 5–15)
BUN: 22 mg/dL (ref 8–23)
CO2: 20 mmol/L — ABNORMAL LOW (ref 22–32)
Calcium: 8 mg/dL — ABNORMAL LOW (ref 8.9–10.3)
Chloride: 113 mmol/L — ABNORMAL HIGH (ref 98–111)
Creatinine, Ser: 1.13 mg/dL (ref 0.61–1.24)
GFR calc Af Amer: >60 mL/min (ref >60)
GFR calc non Af Amer: >60 mL/min (ref >60)
Glucose, Bld: 75 mg/dL (ref 70–99)
Potassium: 4.1 mmol/L (ref 3.5–5.1)
Sodium: 141 mmol/L (ref 135–145)

## 2018-08-21 LAB — COOXEMETRY PANEL
Carboxyhemoglobin: 0.8 % (ref 0.5–1.5)
Methemoglobin: 1.3 % (ref 0.0–1.5)
O2 Saturation: 51.9 %
Total hemoglobin: 10.5 g/dL — ABNORMAL LOW (ref 12.0–16.0)

## 2018-08-21 LAB — PROCALCITONIN: Procalcitonin: 8.95 ng/mL

## 2018-08-21 MED ORDER — DEXMEDETOMIDINE HCL IN NACL 200 MCG/50ML IV SOLN
INTRAVENOUS | Status: AC
  Administered 2018-08-21: 0.4 ug/kg/h via INTRAVENOUS
  Filled 2018-08-21: qty 50

## 2018-08-21 MED ORDER — MEPERIDINE HCL 25 MG/ML IJ SOLN
12.5000 mg | Freq: Once | INTRAMUSCULAR | Status: AC
  Administered 2018-08-21: 12.5 mg via INTRAVENOUS
  Filled 2018-08-21: qty 1

## 2018-08-21 MED ORDER — VITAL AF 1.2 CAL PO LIQD
1000.0000 mL | ORAL | Status: DC
  Administered 2018-08-21: 15:00:00 1000 mL

## 2018-08-21 MED ORDER — DILTIAZEM HCL-DEXTROSE 100-5 MG/100ML-% IV SOLN (PREMIX)
5.0000 mg/h | INTRAVENOUS | Status: DC
  Administered 2018-08-21: 19:00:00 5 mg/h via INTRAVENOUS
  Administered 2018-08-22: 7.5 mg/h via INTRAVENOUS
  Filled 2018-08-21 (×2): qty 100

## 2018-08-21 MED ORDER — ACETAMINOPHEN 160 MG/5ML PO SOLN
650.0000 mg | Freq: Once | ORAL | Status: AC
  Administered 2018-08-21: 650 mg
  Filled 2018-08-21: qty 20.3

## 2018-08-21 MED ORDER — DILTIAZEM HCL 25 MG/5ML IV SOLN
10.0000 mg | INTRAVENOUS | Status: AC
  Filled 2018-08-21: qty 5

## 2018-08-21 MED ORDER — DEXMEDETOMIDINE HCL IN NACL 200 MCG/50ML IV SOLN
0.4000 ug/kg/h | INTRAVENOUS | Status: DC
  Administered 2018-08-21 (×2): 0.4 ug/kg/h via INTRAVENOUS
  Administered 2018-08-22: 02:00:00 0.6 ug/kg/h via INTRAVENOUS
  Administered 2018-08-22: 1.2 ug/kg/h via INTRAVENOUS
  Administered 2018-08-22: 07:00:00 0.6 ug/kg/h via INTRAVENOUS
  Administered 2018-08-22 (×3): 1.2 ug/kg/h via INTRAVENOUS
  Administered 2018-08-22: 0.3 ug/kg/h via INTRAVENOUS
  Administered 2018-08-23: 01:00:00 1.2 ug/kg/h via INTRAVENOUS
  Administered 2018-08-23: 07:00:00 0.6 ug/kg/h via INTRAVENOUS
  Administered 2018-08-23: 03:00:00 1.2 ug/kg/h via INTRAVENOUS
  Administered 2018-08-23: 05:00:00 1.1 ug/kg/h via INTRAVENOUS
  Filled 2018-08-21 (×12): qty 50

## 2018-08-21 NOTE — TOC Initial Note (Signed)
Transition of Care [facility]) - Initial/Assessment Note    Patient Details  Name: [name] [name] MRN: [id number] Date of Birth: 1940-05-08  Transition of Care [facility]) CM/SW Contact:    Sherrilyn Rist - covering CM Phone Number: [phone] 08/21/2018, 10:07 AM  Clinical Narrative:                 Witness arrest, in ICU on vent support; septic shock; general condition guarded. CM following at a distance.  Expected Discharge Plan: (vent support/ condition critical)     Patient Goals and CMS Choice Patient states their goals for this hospitalization and ongoing recovery are:: vent support      Expected Discharge Plan and Services Expected Discharge Plan: (vent support/ condition critical)                                              Prior Living Arrangements/Services                       Activities of Daily Living      Permission Sought/Granted                  Emotional Assessment         Alcohol / Substance Use: Not Applicable Psych Involvement: No (comment)  Admission diagnosis:  Cardiac arrest ([place]) [I46.9] Transaminitis [R74.0] Hypoxia [R09.02] AKI (acute kidney injury) ([place]) [N17.9] Contusion of forehead, initial encounter [S00.83XA] Endotracheally intubated [Z97.8] Cardiopulmonary resuscitation (CPR)-only resuscitation status [Z78.9] Patient Active Problem List   Diagnosis Date Noted  . Cardiopulmonary resuscitation (CPR)-only resuscitation status 08/17/2018   PCP:  Chipper Herb, MD Pharmacy:   [facility] [address] [place] [address] Phone: [phone] Fax: [phone]     Social Determinants of Health (SDOH) Interventions    Readmission Risk Interventions No flowsheet data found.

## 2018-08-21 NOTE — Procedures (Signed)
Cortrak  Person Inserting Tube:  Esaw Dace, RD Tube Type:  Cortrak - 43 inches Tube Location:  Right nare Initial Placement:  Postpyloric Secured by: Bridle Technique Used to Measure Tube Placement:  Documented cm marking at nare/ corner of mouth Cortrak Secured At:  90 cm Procedure Comments:  Cortrak Tube Team Note:  Consult received to place a Cortrak feeding tube.   X-ray is required, abdominal x-ray has been ordered by the Cortrak team. Please confirm tube placement before using the Cortrak tube.   If the tube becomes dislodged please keep the tube and contact the Cortrak team at [email or web address] ([email or web address]) for replacement.  If after hours and replacement cannot be delayed, place a NG tube and confirm placement with an abdominal x-ray.   Kerman Passey MS, RD, [place], [place] [phone] Pager  [phone] Weekend/On-Call Pager

## 2018-08-21 NOTE — Progress Notes (Addendum)
NAME:  [name] [name], MRN:  [id number], DOB:  [birth year], LOS: 4 ADMISSION DATE:  08/17/2018, CONSULTATION DATE:  08/17/2018 REFERRING MD: ED provider, CHIEF COMPLAINT: Post cardiac arrest  Brief History   78 year old male with DM who had a witnessed collapse. Bystander reported complaints about fatigue and concern about his sugar. CPR initiated immediately. ACLS crew arrived within 5 minutes. Total of 11 minutes prior to ROSC. Required 2 shocks for V. Fib Comatose on presentation to the emergency department  History of present illness   Denies any complaints of fever, cough, shortness of breath prior to leaving for the store  Past Medical History  Diabetes-according to family well-controlled  Significant Hospital Events     Consults:  cardiology  Procedures:    Significant Diagnostic Tests:  CT head -  No acute intracranial pathology.  EKG did not reveal any ST T wave changes TTE 5/3 - EF 55-60%. Impaired relaxation.  Micro Data:  4/30 [place], pending 5/3 [place], pending 5/3 Trach asp - moderate GNR on stain, pending  Antimicrobials:  Azithromycin 08/17/2018 once Ceftriaxone 08/17/2018 once Unasyn 5/1>>5/3 Vanc 5/3>> Zosyn 5>>  Interim history/subjective:  Afebrile. Worsening hypotension requiring increased pressor support, IVF and broadening of antibiotics. Today, off pressors and improved oxygenation. Episode of AFRVR overnight.  Objective   Blood pressure 115/64, pulse (!) 101, temperature 98.6 F (37 C), temperature source Bladder, resp. rate 15, height 5\' 11"  (1.803 m), weight 79 kg, SpO2 100 %. CVP:  [6 mmHg-13 mmHg] 9 mmHg  Vent Mode: PRVC FiO2 (%):  [80 %-100 %] 80 % Set Rate:  [20 bmp] 20 bmp Vt Set:  [600 mL] 600 mL PEEP:  [12 cmH20] 12 cmH20 Plateau Pressure:  [18 cmH20-27 cmH20] 20 cmH20   Intake/Output Summary (Last 24 hours) at 08/21/2018 0746 Last data filed at 08/21/2018 0700 Gross per 24 hour  Intake 3242.71 ml  Output 2485 ml  Net  757.71 ml   Filed Weights   08/19/18 0500 08/20/18 0600 08/21/18 0330  Weight: 80.5 kg 79.1 kg 79 kg   Physical Exam: General: Elderly male, sedated, on mechanical vent HENT: [place], AT, ETT in place Respiratory: Diminished breath sounds bilaterally.  No crackles, wheezing or rales Cardiovascular: RRR, -M/R/G, no JVD GI: BS+, soft, nontender Extremities:-Edema,-tenderness, cool but good cap refill and pulses palpable x 4 Neuro: Sedated GU: Foley in place  Resolved Hospital Problem list     Assessment & Plan:   Status-post cardiac arrest V. Fib cardiac arrest Likely preceded by hypoglycemia however initial rhythm VF so will need to consider potential ischemic event. Peak troponin 2.77. S/p hypothermia protocol on 4/30. TTE with normal EF. - Cardiology consultation appreciated. Will plan on cath when patient improves - ASA, statin  Acute hypoxemic respiratory failure Concern for aspiration pneumonitis/pneumonia and pulmonary edema. Trach asp stain GNR Improving oxygenation this am - PRVC 8cc/kg - Ween PEEP/FIO2 for goal SpO2 88-95% or pO2 55-80 - Continue Vanc/Zosyn - Consider gentle diuresis for goal net even  Septic shock secondary to pneumonia - Off levo and vaso - MAP goal >65 - F/u blood and trach aspirate cultures - Antibiotics as above  pAFRVR May be aggravated on pressors -Telemetry  Head trauma with scalp injury Follows commands on WUA - s/p fall, continue observe  - EEG negative - WUA off sedation today  DMII, hyperglycemia  - CBGs q4H with SSI   Nutritional Support - Trickle TF  Best practice:  Diet: TF, N.p.o.  Pain/Anxiety/Delirium protocol (if indicated): Fentanyl, Versed VAP protocol (if indicated): Yes DVT prophylaxis: Heparin GI prophylaxis: Protonix Glucose control: SSI Mobility: Bedrest Code Status: Full code Family Communication: Updated son on 5/4. He will update his mother/wife of patient. Disposition: ICU  Labs   CBC: Recent Labs   Lab 08/17/18 1751  08/18/18 0220 08/18/18 0526  08/19/18 1904 08/20/18 0348 08/20/18 0745 08/20/18 1455 08/21/18 0306  WBC 27.0*  --  17.5* 14.3*  --   --  19.6*  --   --  10.3  HGB 13.7   < > 12.3* 12.1*   < > 12.2* 12.6* 11.6* 11.2* 10.5*  HCT 44.1   < > 38.4* 37.2*   < > 36.0* 40.3 34.0* 33.0* 33.1*  MCV 87.2  --  84.2 83.6  --   --  86.9  --   --  85.5  PLT 309  --  261 248  --   --  291  --   --  197   < > = values in this interval not displayed.    Basic Metabolic Panel: Recent Labs  Lab 08/18/18 0526  08/18/18 0856 08/18/18 1613 08/19/18 0209  08/19/18 1803 08/19/18 1904 08/20/18 0348 08/20/18 0745 08/20/18 1455 08/21/18 0306  NA 139   < >  --  141 141   < >  --  145 143 144 141 141  K 3.2*   < >  --  5.5* 4.1   < >  --  4.8 4.4 4.3 5.9* 4.1  CL 109  --   --  112* 115*  --   --   --  114*  --   --  113*  CO2 20*  --   --  18* 18*  --   --   --  18*  --   --  20*  GLUCOSE 133*  --   --  180* 249*  --   --   --  128*  --   --  75  BUN 11  --   --  10 10  --   --   --  15  --   --  22  CREATININE 0.68  --   --  1.01 0.79  --   --   --  1.22  --   --  1.13  CALCIUM 8.0*  --   --  8.1* 7.2*  --   --   --  8.2*  --   --  8.0*  MG 1.8  --  1.8 2.3 2.0  --  2.1  --   --   --   --   --   PHOS 1.7*  --  1.8* 3.7 1.7*  --  4.2  --   --   --   --   --    < > = values in this interval not displayed.   GFR: Estimated Creatinine Clearance: 58.3 mL/min (by C-G formula based on SCr of 1.13 mg/dL). Recent Labs  Lab 08/17/18 1616  08/18/18 0220 08/18/18 0526 08/20/18 0348 08/20/18 0802 08/20/18 0948 08/20/18 1045 08/21/18 0306  PROCALCITON  --   --   --   --   --   --  12.97  --  8.95  WBC  --    < > 17.5* 14.3* 19.6*  --   --   --  10.3  LATICACIDVEN 6.7*  --   --   --   --  1.4  --  1.4  --    < > = values in this interval not displayed.    Liver Function Tests: Recent Labs  Lab 08/17/18 1532  AST 336*  ALT 257*  ALKPHOS 72  BILITOT 0.6  PROT 5.8*  ALBUMIN  3.2*   No results for input(s): LIPASE, AMYLASE in the last 168 hours. No results for input(s): AMMONIA in the last 168 hours.  ABG    Component Value Date/Time   PHART 7.293 (L) 08/20/2018 1455   PCO2ART 36.0 08/20/2018 1455   PO2ART 91.0 08/20/2018 1455   HCO3 17.4 (L) 08/20/2018 1455   TCO2 19 (L) 08/20/2018 1455   ACIDBASEDEF 8.0 (H) 08/20/2018 1455   O2SAT 51.9 08/21/2018 0435     Coagulation Profile: Recent Labs  Lab 08/17/18 1532 08/17/18 1751 08/18/18 0220 08/18/18 1613  INR 1.2 1.1 1.1 1.0    Cardiac Enzymes: Recent Labs  Lab 08/17/18 1957 08/18/18 0017 08/18/18 0526 08/18/18 1224 08/19/18 0824  TROPONINI 1.28* 2.07* 2.56* 2.77* 2.03*    HbA1C: No results found for: HGBA1C  CBG: Recent Labs  Lab 08/20/18 1711 08/20/18 1944 08/20/18 2348 08/21/18 0316 08/21/18 0732  GLUCAP 176* 228* 100* 70 110*    The patient is critically ill with multiple organ systems failure and requires high complexity decision making for assessment and support, frequent evaluation and titration of therapies, application of advanced monitoring technologies and extensive interpretation of multiple databases.   Critical Care Time devoted to patient care services described in this note is 39 Minutes. This time reflects time of care of this signee Dr. Rodman Pickle. This critical care time does not reflect procedure time, or teaching time or supervisory time of PA/NP/Med student/Med Resident etc but could involve care discussion time.  Rodman Pickle, M.D. [facility] Pulmonary/Critical Care Medicine Pager: [phone] After hours pager: [phone]

## 2018-08-21 NOTE — Progress Notes (Signed)
Patient was transported to CT & back to 2H02 without any complications.

## 2018-08-21 NOTE — Progress Notes (Signed)
CCU PM Round: Pt remains intubated, sedated. On precedex.  Off all vasoactive drugs.  Tele: sinus rhythm no further AF/RVR.  Plan: continue current Rx.   Sherren Mocha 08/21/2018 5:39 PM

## 2018-08-21 NOTE — Progress Notes (Signed)
Nutrition Follow-up  DOCUMENTATION CODES:   Not applicable  INTERVENTION:   - Recommend bowel regimen as pt's last recorded BM was on 4/30  Once post-pyloric Cortrak placed, initiate trickle TF: - Vital AF 1.2 @ 20 ml/hr (480 ml/day)  Trickle tube feeding regimen provides 576 kcal, 36 grams of protein, and 389 ml of H2O.  Goal TF regimen: - Vital AF 1.2 @ 55 ml/hr (1320 ml/day) - Pro-stat 30 ml BID  Goal tube feeding regimen provides 1784 kcal, 129 grams of protein, and 1070 ml of H2O (97% of kcal needs, 103% of protein needs).  NUTRITION DIAGNOSIS:   Inadequate oral intake related to inability to eat as evidenced by NPO status.  Ongoing, being addressed via TF  GOAL:   Patient will meet greater than or equal to 90% of their needs  Progressing  MONITOR:   Vent status, Labs, TF tolerance, I & O's, Weight trends  REASON FOR ASSESSMENT:   Ventilator, Consult Enteral/tube feeding initiation and management  ASSESSMENT:   78 year old male who presented to the ED on 4/30 after a witnessed cardiac arrest. Immediate bystander CPR initiated. Pt found to be in V-fib on EMS arrival and was given 1 round of Epi and 2 shocks. ROSC achieved after ~11 minutes of CPR. Pt intubated in the field. Pt transferred to the ICU and TTM initiated. PMH of T2DM.  Discussed pt with RN and CCM MD. Pt with episodes of emesis over the weekend with concern for aspiration pneumonitis/pneumonia. Cardiology suspecting septic shock due to aspiration. Plan is for placement of post-pyloric Cortrak tube and trickle feeds today.  Spoke with MD regarding advancing TF. Per MD, will wait to titrate to goal rate until tomorrow.  OGT in stomach to low intermittent suction.  Pressors off this AM.  Patient is currently intubated on ventilator support MV: 12.7 L/min Temp (24hrs), Avg:98.6 F (37 C), Min:97.9 F (36.6 C), Max:99.5 F (37.5 C) BP: 121/67 MAP: 86  Fentanyl: 2.5 ml/hr  Medications  reviewed and include: SSI q 4 hours, heparin, Protonix  Labs reviewed: chloride 113 (H), CO2 20 (L) CBG's: 110, 70, 100, 228, 176, 153 x 24 hours  UOP: 2035 ml x 24 hours OGT: 450 ml x 24 hours I/O's: +2.9 L since admit  NUTRITION - FOCUSED PHYSICAL EXAM:    Most Recent Value  Orbital Region  No depletion  Upper Arm Region  Mild depletion  Thoracic and Lumbar Region  Unable to assess  Buccal Region  Unable to assess  Temple Region  Unable to assess  Clavicle Bone Region  Mild depletion  Clavicle and Acromion Bone Region  Moderate depletion  Scapular Bone Region  Unable to assess  Dorsal Hand  No depletion  Patellar Region  Mild depletion  Anterior Thigh Region  Unable to assess  Posterior Calf Region  Mild depletion  Edema (RD Assessment)  Moderate [generalized]  Hair  Reviewed  Eyes  Unable to assess  Mouth  Unable to assess  Skin  Reviewed  Nails  Reviewed       Diet Order:   Diet Order            Diet NPO time specified  Diet effective now              EDUCATION NEEDS:   Not appropriate for education at this time  Skin:  Skin Assessment: Reviewed RN Assessment  Last BM:  08/17/18  Height:   Ht Readings from Last 1 Encounters:  08/17/18 5\' 11"  (  1.803 m)    Weight:   Wt Readings from Last 1 Encounters:  08/21/18 79 kg    Ideal Body Weight:  78.2 kg  BMI:  Body mass index is 24.29 kg/m.  Estimated Nutritional Needs:   Kcal:  1841  Protein:  110-125  Fluid:  >/= 1.7 L    Gaynell Face, MS, RD, LDN Inpatient Clinical Dietitian Pager: [phone] Weekend/After Hours: [phone]

## 2018-08-21 NOTE — Progress Notes (Signed)
Progress Note  Patient Name: [name] [name] Date of Encounter: 08/21/2018  Primary Cardiologist: No primary care provider on file.   Subjective   Intubated, sedated.  Overnight events noted.  Patient had atrial fibrillation with RVR from approximately 2 AM until 6:30 AM, now converted back to sinus rhythm spontaneously.  He has improved overnight and has weaned off of all vasoactive agents.  Inpatient Medications    Scheduled Meds: . aspirin  81 mg Per Tube Daily  . atorvastatin  80 mg Per Tube q1800  . chlorhexidine gluconate (MEDLINE KIT)  15 mL Mouth Rinse BID  . Chlorhexidine Gluconate Cloth  6 each Topical Daily  . furosemide  60 mg Intravenous BID  . heparin  5,000 Units Subcutaneous Q8H  . insulin aspart  2-6 Units Subcutaneous Q4H  . mouth rinse  15 mL Mouth Rinse 10 times per day  . midazolam  2 mg Intravenous Once  . pantoprazole (PROTONIX) IV  40 mg Intravenous QHS   Continuous Infusions: . sodium chloride Stopped (08/18/18 1328)  . sodium chloride Stopped (08/19/18 1612)  . feeding supplement (VITAL AF 1.2 CAL) Stopped (08/19/18 2050)  . fentaNYL infusion INTRAVENOUS 100 mcg/hr (08/21/18 0700)  . midazolam Stopped (08/21/18 0539)  . norepinephrine (LEVOPHED) Adult infusion Stopped (08/20/18 2102)  . piperacillin-tazobactam (ZOSYN)  IV 12.5 mL/hr at 08/21/18 0700  . propofol (DIPRIVAN) infusion 20 mcg/kg/min (08/21/18 0700)  . vancomycin Stopped (08/21/18 9563)  . vasopressin (PITRESSIN) infusion - *FOR SHOCK* Stopped (08/21/18 0630)   PRN Meds: Place/Maintain arterial line **AND** sodium chloride, acetaminophen (TYLENOL) oral liquid 160 mg/5 mL, fentaNYL, ipratropium-albuterol, metoprolol tartrate, sodium chloride flush   Vital Signs    Vitals:   08/21/18 0615 08/21/18 0630 08/21/18 0645 08/21/18 0700  BP:    115/64  Pulse: (!) 101 (!) 147 100 (!) 101  Resp: '20 20 16 15  ' Temp:    98.6 F (37 C)  TempSrc:    Bladder  SpO2: 100% 100% 100% 100%  Weight:       Height:        Intake/Output Summary (Last 24 hours) at 08/21/2018 0746 Last data filed at 08/21/2018 0700 Gross per 24 hour  Intake 3242.71 ml  Output 2485 ml  Net 757.71 ml   Last 3 Weights 08/21/2018 08/20/2018 08/19/2018  Weight (lbs) 174 lb 2.6 oz 174 lb 6.1 oz 177 lb 7.5 oz  Weight (kg) 79 kg 79.1 kg 80.5 kg      Telemetry    Sinus tachycardia with approximately 4-1/2 hours of atrial fibrillation with RVR, now converted back to sinus - Personally Reviewed   Physical Exam  Intubated/sedated GEN: No acute distress.   Neck: No JVD Cardiac:  Tachycardic and regular, no murmurs, rubs, or gallops.  Respiratory:  Coarse breath sounds to auscultation bilaterally. GI: Soft, no masses MS: No edema; No deformity. Neuro:   Sedated Skin: No rash  Labs    Chemistry Recent Labs  Lab 08/17/18 1532  08/19/18 0209  08/20/18 0348 08/20/18 0745 08/20/18 1455 08/21/18 0306  NA 141   < > 141   < > 143 144 141 141  K 3.4*   < > 4.1   < > 4.4 4.3 5.9* 4.1  CL 106   < > 115*  --  114*  --   --  113*  CO2 15*   < > 18*  --  18*  --   --  20*  GLUCOSE 216*   < >  249*  --  128*  --   --  75  BUN 9   < > 10  --  15  --   --  22  CREATININE 1.32*   < > 0.79  --  1.22  --   --  1.13  CALCIUM 8.7*   < > 7.2*  --  8.2*  --   --  8.0*  PROT 5.8*  --   --   --   --   --   --   --   ALBUMIN 3.2*  --   --   --   --   --   --   --   AST 336*  --   --   --   --   --   --   --   ALT 257*  --   --   --   --   --   --   --   ALKPHOS 72  --   --   --   --   --   --   --   BILITOT 0.6  --   --   --   --   --   --   --   GFRNONAA 52*   < > >60  --  57*  --   --  >60  GFRAA 60*   < > >60  --  >60  --   --  >60  ANIONGAP 20*   < > 8  --  11  --   --  8   < > = values in this interval not displayed.     Hematology Recent Labs  Lab 08/18/18 0526  08/20/18 0348 08/20/18 0745 08/20/18 1455 08/21/18 0306  WBC 14.3*  --  19.6*  --   --  10.3  RBC 4.45  --  4.64  --   --  3.87*  HGB 12.1*   < >  12.6* 11.6* 11.2* 10.5*  HCT 37.2*   < > 40.3 34.0* 33.0* 33.1*  MCV 83.6  --  86.9  --   --  85.5  MCH 27.2  --  27.2  --   --  27.1  MCHC 32.5  --  31.3  --   --  31.7  RDW 13.8  --  14.6  --   --  14.9  PLT 248  --  291  --   --  197   < > = values in this interval not displayed.    Cardiac Enzymes Recent Labs  Lab 08/18/18 0017 08/18/18 0526 08/18/18 1224 08/19/18 0824  TROPONINI 2.07* 2.56* 2.77* 2.03*   No results for input(s): TROPIPOC in the last 168 hours.   BNPNo results for input(s): BNP, PROBNP in the last 168 hours.   DDimer No results for input(s): DDIMER in the last 168 hours.   Radiology    Dg Chest 1 View  Result Date: 08/19/2018 CLINICAL DATA:  Hypoxemia EXAM: CHEST  1 VIEW COMPARISON:  08/18/2018 FINDINGS: Perihilar and lower lobe airspace opacities, likely edema. Low lung volumes. Small left pleural effusion. Heart is borderline in size. Endotracheal tube and NG tube are unchanged. IMPRESSION: Perihilar and lower lobe opacities, likely edema. Small left effusion. Electronically Signed   By: Rolm Baptise M.D.   On: 08/19/2018 19:50    Cardiac Studies   2D echocardiogram 08/19/2018: 1. The left ventricle has normal systolic function, with an ejection fraction of 55-60%. The cavity size  was normal. There is mildly increased left ventricular wall thickness. Left ventricular diastolic Doppler parameters are consistent with impaired  relaxation.  2. The right ventricle has normal systolic function. The cavity was normal. There is no increase in right ventricular wall thickness.  3. The mitral valve is abnormal. Mild thickening of the mitral valve leaflet.  4. The aortic valve is tricuspid. Mild thickening of the aortic valve. Mild calcification of the aortic valve.  Patient Profile     78 y.o. male with type 2 diabetes, hypertension, and mixed hyperlipidemia, admitted 08/17/2018 with out of hospital ventricular fibrillation arrest  Assessment & Plan    1.  Out  of hospital ventricular fibrillation arrest: ROSC after 11 minutes of CPR with shocks and epi administered as well.    Patient status post hypothermia per protocol, reportedly was following commands before developing hypotension and fever.  Has been sedated since that time.   2. Elevated troponin suspect demand ischemia: Patient's troponin increase has been flat over a 3-day period.  ACS possible but clinical scenario seems more consistent with demand ischemia.  Will likely pursue heart catheterization pending his clinical recovery.  3.  Atrial fibrillation with RVR: Patient with about 4 hours of atrial fibrillation overnight.  Now off of pressors and hemodynamically stable in sinus rhythm.  Continue to follow.  If recurrent atrial fibrillation will start systemic anticoagulation and possibly start IV amiodarone.  4.  Acute hypoxemic respiratory failure: Remains intubated but improving this morning.  IV furosemide ordered per CCM team for gentle diuresis, but cautious in the setting of likely infection.  Overall patient seems to be improving this morning.  We will continue to follow with you.  The patient is critically ill with multiple organ systems failure and requires high complexity decision making for assessment and support, frequent evaluation and titration of therapies, application of advanced monitoring technologies and extensive interpretation of multiple databases.   Critical Care Time devoted to patient care services described in this note is 35 minutes. Case d/w RN at bedside and with Dr Loanne Drilling of [place].    For questions or updates, please contact [place] Please consult [email or web address] for contact info under     Signed, Sherren Mocha, MD  08/21/2018, 7:46 AM   Pg [phone]

## 2018-08-22 DIAGNOSIS — E876 Hypokalemia: Secondary | ICD-10-CM

## 2018-08-22 DIAGNOSIS — J156 Pneumonia due to other aerobic Gram-negative bacteria: Secondary | ICD-10-CM

## 2018-08-22 DIAGNOSIS — I4891 Unspecified atrial fibrillation: Secondary | ICD-10-CM

## 2018-08-22 DIAGNOSIS — N179 Acute kidney failure, unspecified: Secondary | ICD-10-CM

## 2018-08-22 LAB — CULTURE, BLOOD (ROUTINE X 2)
Culture: NO GROWTH
Culture: NO GROWTH
Special Requests: ADEQUATE

## 2018-08-22 LAB — BASIC METABOLIC PANEL
Anion gap: 13 (ref 5–15)
BUN: 25 mg/dL — ABNORMAL HIGH (ref 8–23)
CO2: 18 mmol/L — ABNORMAL LOW (ref 22–32)
Calcium: 8.4 mg/dL — ABNORMAL LOW (ref 8.9–10.3)
Chloride: 110 mmol/L (ref 98–111)
Creatinine, Ser: 1.26 mg/dL — ABNORMAL HIGH (ref 0.61–1.24)
GFR calc Af Amer: >60 mL/min (ref >60)
GFR calc non Af Amer: 55 mL/min — ABNORMAL LOW (ref >60)
Glucose, Bld: 251 mg/dL — ABNORMAL HIGH (ref 70–99)
Potassium: 3.3 mmol/L — ABNORMAL LOW (ref 3.5–5.1)
Sodium: 141 mmol/L (ref 135–145)

## 2018-08-22 LAB — CBC
HCT: 30.1 % — ABNORMAL LOW (ref 39.0–52.0)
Hemoglobin: 9.5 g/dL — ABNORMAL LOW (ref 13.0–17.0)
MCH: 26.5 pg (ref 26.0–34.0)
MCHC: 31.6 g/dL (ref 30.0–36.0)
MCV: 83.8 fL (ref 80.0–100.0)
Platelets: 203 10*3/uL (ref 150–400)
RBC: 3.59 MIL/uL — ABNORMAL LOW (ref 4.22–5.81)
RDW: 14.8 % (ref 11.5–15.5)
WBC: 9.4 10*3/uL (ref 4.0–10.5)
nRBC: 0 % (ref 0.0–0.2)

## 2018-08-22 LAB — GLUCOSE, CAPILLARY
Glucose-Capillary: 253 mg/dL — ABNORMAL HIGH (ref 70–99)
Glucose-Capillary: 220 mg/dL — ABNORMAL HIGH (ref 70–99)
Glucose-Capillary: 244 mg/dL — ABNORMAL HIGH (ref 70–99)
Glucose-Capillary: 233 mg/dL — ABNORMAL HIGH (ref 70–99)
Glucose-Capillary: 173 mg/dL — ABNORMAL HIGH (ref 70–99)
Glucose-Capillary: 163 mg/dL — ABNORMAL HIGH (ref 70–99)
Glucose-Capillary: 174 mg/dL — ABNORMAL HIGH (ref 70–99)
Glucose-Capillary: 159 mg/dL — ABNORMAL HIGH (ref 70–99)
Glucose-Capillary: 136 mg/dL — ABNORMAL HIGH (ref 70–99)
Glucose-Capillary: 138 mg/dL — ABNORMAL HIGH (ref 70–99)
Glucose-Capillary: 164 mg/dL — ABNORMAL HIGH (ref 70–99)
Glucose-Capillary: 167 mg/dL — ABNORMAL HIGH (ref 70–99)
Glucose-Capillary: 143 mg/dL — ABNORMAL HIGH (ref 70–99)
Glucose-Capillary: 235 mg/dL — ABNORMAL HIGH (ref 70–99)

## 2018-08-22 LAB — PROCALCITONIN: Procalcitonin: 5.34 ng/mL

## 2018-08-22 LAB — CULTURE, RESPIRATORY W GRAM STAIN: Special Requests: NORMAL

## 2018-08-22 LAB — COOXEMETRY PANEL
Carboxyhemoglobin: 1.6 % — ABNORMAL HIGH (ref 0.5–1.5)
Methemoglobin: 1.2 % (ref 0.0–1.5)
O2 Saturation: 95.7 %
Total hemoglobin: 10.2 g/dL — ABNORMAL LOW (ref 12.0–16.0)

## 2018-08-22 LAB — HEPARIN LEVEL (UNFRACTIONATED)
Heparin Unfractionated: 0.44 IU/mL (ref 0.30–0.70)
Heparin Unfractionated: 0.27 IU/mL — ABNORMAL LOW (ref 0.30–0.70)

## 2018-08-22 LAB — CULTURE, RESPIRATORY

## 2018-08-22 MED ORDER — POTASSIUM CHLORIDE 20 MEQ/15ML (10%) PO SOLN
20.0000 meq | ORAL | Status: AC
  Administered 2018-08-22 (×2): 20 meq
  Filled 2018-08-22 (×2): qty 15

## 2018-08-22 MED ORDER — AMLODIPINE BESYLATE 10 MG PO TABS
10.0000 mg | ORAL_TABLET | Freq: Every day | ORAL | Status: DC
  Administered 2018-08-22 – 2018-08-28 (×7): 10 mg via ORAL
  Filled 2018-08-22 (×7): qty 1

## 2018-08-22 MED ORDER — AMIODARONE HCL IN DEXTROSE 360-4.14 MG/200ML-% IV SOLN
30.0000 mg/h | INTRAVENOUS | Status: DC
  Administered 2018-08-22 – 2018-08-23 (×4): 30 mg/h via INTRAVENOUS
  Filled 2018-08-22 (×3): qty 200

## 2018-08-22 MED ORDER — FENTANYL CITRATE (PF) 100 MCG/2ML IJ SOLN
INTRAMUSCULAR | Status: AC
  Filled 2018-08-22: qty 2

## 2018-08-22 MED ORDER — INSULIN DETEMIR 100 UNIT/ML ~~LOC~~ SOLN
10.0000 [IU] | Freq: Two times a day (BID) | SUBCUTANEOUS | Status: DC
  Administered 2018-08-22 – 2018-08-24 (×5): 10 [IU] via SUBCUTANEOUS
  Filled 2018-08-22 (×8): qty 0.1

## 2018-08-22 MED ORDER — AMLODIPINE 1 MG/ML ORAL SUSPENSION: 10.0000 mg | Freq: Every day | ORAL | Status: DC

## 2018-08-22 MED ORDER — INSULIN ASPART 100 UNIT/ML ~~LOC~~ SOLN
2.0000 [IU] | SUBCUTANEOUS | Status: DC
  Administered 2018-08-22: 17:00:00 2 [IU] via SUBCUTANEOUS
  Administered 2018-08-22 – 2018-08-23 (×3): 6 [IU] via SUBCUTANEOUS
  Administered 2018-08-23: 17:00:00 4 [IU] via SUBCUTANEOUS
  Administered 2018-08-23: 23:00:00 6 [IU] via SUBCUTANEOUS
  Administered 2018-08-23: 20:00:00 4 [IU] via SUBCUTANEOUS
  Administered 2018-08-23: 12:00:00 2 [IU] via SUBCUTANEOUS
  Administered 2018-08-23 – 2018-08-24 (×2): 4 [IU] via SUBCUTANEOUS
  Administered 2018-08-24 (×2): 6 [IU] via SUBCUTANEOUS
  Administered 2018-08-24: 04:00:00 4 [IU] via SUBCUTANEOUS
  Administered 2018-08-25: 05:00:00 6 [IU] via SUBCUTANEOUS
  Administered 2018-08-25: 08:00:00 4 [IU] via SUBCUTANEOUS

## 2018-08-22 MED ORDER — AMIODARONE HCL IN DEXTROSE 360-4.14 MG/200ML-% IV SOLN
60.0000 mg/h | INTRAVENOUS | Status: AC
  Administered 2018-08-22 (×2): 60 mg/h via INTRAVENOUS
  Filled 2018-08-22 (×2): qty 200

## 2018-08-22 MED ORDER — HEPARIN BOLUS VIA INFUSION
3000.0000 [IU] | Freq: Once | INTRAVENOUS | Status: AC
  Administered 2018-08-22: 10:00:00 3000 [IU] via INTRAVENOUS
  Filled 2018-08-22: qty 3000

## 2018-08-22 MED ORDER — SODIUM CHLORIDE 0.9 % IV SOLN
2.0000 g | Freq: Two times a day (BID) | INTRAVENOUS | Status: AC
  Administered 2018-08-22 – 2018-08-28 (×14): 2 g via INTRAVENOUS
  Filled 2018-08-22 (×17): qty 2

## 2018-08-22 MED ORDER — HYDRALAZINE HCL 20 MG/ML IJ SOLN
10.0000 mg | INTRAMUSCULAR | Status: DC | PRN
  Administered 2018-08-22: 20 mg via INTRAVENOUS
  Administered 2018-08-23 (×2): 10 mg via INTRAVENOUS
  Administered 2018-08-26: 20 mg via INTRAVENOUS
  Filled 2018-08-22 (×5): qty 1

## 2018-08-22 MED ORDER — HYDRALAZINE HCL 20 MG/ML IJ SOLN
10.0000 mg | Freq: Once | INTRAMUSCULAR | Status: AC
  Administered 2018-08-22: 13:00:00 10 mg via INTRAVENOUS

## 2018-08-22 MED ORDER — HEPARIN (PORCINE) 25000 UT/250ML-% IV SOLN
1950.0000 [IU]/h | INTRAVENOUS | Status: DC
  Administered 2018-08-22: 1200 [IU]/h via INTRAVENOUS
  Administered 2018-08-23: 1300 [IU]/h via INTRAVENOUS
  Administered 2018-08-23: 23:00:00 1650 [IU]/h via INTRAVENOUS
  Filled 2018-08-22 (×3): qty 250

## 2018-08-22 MED ORDER — FENTANYL CITRATE (PF) 100 MCG/2ML IJ SOLN
25.0000 ug | INTRAMUSCULAR | Status: DC | PRN
  Administered 2018-08-22: 50 ug via INTRAVENOUS
  Administered 2018-08-22 (×2): 100 ug via INTRAVENOUS
  Administered 2018-08-22: 50 ug via INTRAVENOUS
  Administered 2018-08-22 – 2018-08-23 (×3): 100 ug via INTRAVENOUS
  Filled 2018-08-22 (×6): qty 2

## 2018-08-22 MED ORDER — AMIODARONE LOAD VIA INFUSION
150.0000 mg | Freq: Once | INTRAVENOUS | Status: AC
  Administered 2018-08-22: 150 mg via INTRAVENOUS
  Filled 2018-08-22: qty 83.34

## 2018-08-22 MED ORDER — INSULIN REGULAR(HUMAN) IN NACL 100-0.9 UT/100ML-% IV SOLN
INTRAVENOUS | Status: DC
  Administered 2018-08-22: 1.6 [IU]/h via INTRAVENOUS
  Filled 2018-08-22: qty 100

## 2018-08-22 NOTE — Progress Notes (Addendum)
ANTICOAGULATION CONSULT NOTE - Initial Consult  Pharmacy Consult for Heparin Indication: atrial fibrillation  Not on File  Patient Measurements: Height: 5\' 11"  (180.3 cm) Weight: 173 lb 4.5 oz (78.6 kg) IBW/kg (Calculated) : 75.3  Vital Signs: Temp: 99.9 F (37.7 C) (05/05 2200) Temp Source: Bladder (05/05 2000) BP: 100/59 (05/05 2200) Pulse Rate: 65 (05/05 2200)  Labs: Recent Labs    08/20/18 0348  08/20/18 1455 08/21/18 0306 08/22/18 0354 08/22/18 1430 08/22/18 2151  HGB 12.6*   < > 11.2* 10.5* 9.5*  --   --   HCT 40.3   < > 33.0* 33.1* 30.1*  --   --   PLT 291  --   --  197 203  --   --   HEPARINUNFRC  --   --   --   --   --  0.44 0.27*  CREATININE 1.22  --   --  1.13 1.26*  --   --    < > = values in this interval not displayed.    Estimated Creatinine Clearance: 52.3 mL/min (A) (by C-G formula based on SCr of 1.26 mg/dL (H)).  Assessment: [name] admitted 08/17/18 with out of hospital VFib arrest. Pharmacy now consulted to dose heparin for Afib. Pt not on AC PTA.   Heparin level slightly subtherapeutic at 0.27  Goal of Therapy:  Heparin level 0.3-0.7 units/ml Monitor platelets by anticoagulation protocol: Yes   Plan:  Increase heparin gtt to 1300 units/hr F/u heparin level with AM labs   Addendum: AM remains subtherapeutic Increase heparin gtt to 1400 units/hr F/u 8 hour heparin level  Bertis Ruddy, PharmD Clinical Pharmacist Please check AMION for all [place] numbers 08/22/2018 10:44 PM

## 2018-08-22 NOTE — Progress Notes (Signed)
ANTICOAGULATION CONSULT NOTE - Initial Consult  Pharmacy Consult for Heparin Indication: atrial fibrillation  Not on File  Patient Measurements: Height: 5\' 11"  (180.3 cm) Weight: 173 lb 4.5 oz (78.6 kg) IBW/kg (Calculated) : 75.3  Vital Signs: Temp: 100.2 F (37.9 C) (05/05 1500) Temp Source: Bladder (05/05 1200) BP: 183/77 (05/05 1525) Pulse Rate: 94 (05/05 1500)  Labs: Recent Labs    08/20/18 0348  08/20/18 1455 08/21/18 0306 08/22/18 0354 08/22/18 1430  HGB 12.6*   < > 11.2* 10.5* 9.5*  --   HCT 40.3   < > 33.0* 33.1* 30.1*  --   PLT 291  --   --  197 203  --   HEPARINUNFRC  --   --   --   --   --  0.44  CREATININE 1.22  --   --  1.13 1.26*  --    < > = values in this interval not displayed.    Estimated Creatinine Clearance: 52.3 mL/min (A) (by C-G formula based on SCr of 1.26 mg/dL (H)).  Assessment: [name] admitted 08/17/18 with out of hospital VFib arrest. Pharmacy now consulted to dose heparin for Afib. Pt not on AC PTA. Hgb trending down to 9.5, plts ok, SCR up to 1.26.   Pharmacy also consulted for cefepime dosing for HCAP with TA growing hafnia alvei. WBC WNL, febrile tmax 100.5, CrCl ~52 ml/min.  Goal of Therapy:  Heparin level 0.3-0.7 units/ml Monitor platelets by anticoagulation protocol: Yes   Plan:  Give 3000 units bolus x 1 Start heparin infusion at 1200 units/hr  Check Heparin level in 6 hours Monitor daily CBC, heparin level, s/sx bleeding Start cefepime 2g Q12hrs  Monitor renal function, abx LOT   Addendum Heparin level therapeutic at 0.44 on 1200 units/hr Will check heparin level in 6 hours   Juanell Fairly, PharmD PGY1 Pharmacy Resident 08/22/2018 3:44 PM

## 2018-08-22 NOTE — Progress Notes (Signed)
[facility] ADULT ICU REPLACEMENT PROTOCOL FOR AM LAB REPLACEMENT ONLY  The patient does apply for the [facility] Adult ICU Electrolyte Replacment Protocol based on the criteria listed below:   1. Is GFR >/= 40 ml/min? Yes.    Patient's GFR today is >60 2. Is urine output >/= 0.5 ml/kg/hr for the last 6 hours? Yes.   Patient's UOP is 0.6 ml/kg/hr 3. Is BUN < 60 mg/dL? Yes.    Patient's BUN today is 25 4. Abnormal electrolyte(s):K 3.3 5. Ordered repletion with: protocol 6. If a panic level lab has been reported, has the CCM MD in charge been notified? No..   Physician:    Ronda Fairly A 08/22/2018 5:22 AM

## 2018-08-22 NOTE — Progress Notes (Signed)
K 3.3. Paged ELink for replacement.

## 2018-08-22 NOTE — Progress Notes (Signed)
NAME:  [name] [name], MRN:  [id number], DOB:  08/05/1940, LOS: 5 ADMISSION DATE:  08/17/2018, CONSULTATION DATE:  08/17/2018 REFERRING MD: ED provider, CHIEF COMPLAINT: Post cardiac arrest  Brief History   78 year old male with DM who had a witnessed collapse. Bystander reported complaints about fatigue and concern about his sugar. CPR initiated immediately. ACLS crew arrived within 5 minutes. Total of 11 minutes prior to ROSC. Required 2 shocks for V. Fib Comatose on presentation to the emergency department  History of present illness   Denies any complaints of fever, cough, shortness of breath prior to leaving for the store  Past Medical History  Diabetes-according to family well-controlled  Significant Hospital Events     Consults:  cardiology  Procedures:    Significant Diagnostic Tests:  CT head -  No acute intracranial pathology.  EKG did not reveal any ST T wave changes TTE 5/3 - EF 55-60%. Impaired relaxation.  Micro Data:  4/30 [place] - neg 5/3 [place], pending 5/3 Trach asp - Hafnia Alvei. Sensitive to Cefepime, Cipro and Bactrim  Antimicrobials:  Azithromycin 08/17/2018 once Ceftriaxone 08/17/2018 once Unasyn 5/1>>5/3 Vanc 5/3>>5/5 Zosyn 5/3>>5/5 Cefepime >>5/5  Interim history/subjective:  Febrile. Required lopressor 5 mg x 2 for AFRVR. Started on dilt gtt. Weaned off propofol. On precedex and started on SBT this am.   Objective   Blood pressure (!) 145/86, pulse (!) 114, temperature (!) 100.8 F (38.2 C), resp. rate (!) 24, height 5\' 11"  (1.803 m), weight 78.6 kg, SpO2 96 %. CVP:  [6 mmHg-24 mmHg] 9 mmHg  Vent Mode: PRVC FiO2 (%):  [50 %] 50 % Set Rate:  [20 bmp] 20 bmp Vt Set:  [600 mL] 600 mL PEEP:  [10 cmH20-12 cmH20] 10 cmH20 Plateau Pressure:  [18 cmH20-25 cmH20] 20 cmH20   Intake/Output Summary (Last 24 hours) at 08/22/2018 0806 Last data filed at 08/22/2018 0700 Gross per 24 hour  Intake 1045.62 ml  Output 1665 ml  Net -619.38 ml    Filed  Weights   08/20/18 0600 08/21/18 0330 08/22/18 0500  Weight: 79.1 kg 79 kg 78.6 kg   Physical Exam: General: Elderly-appearing male, sedated, on mechanical ventilation HENT: [place], AT, ETT in place Eyes: EOMI, PERRL no scleral icterus Respiratory: Diminished breath sounds bilaterally.  No crackles, wheezing or rales Cardiovascular: RRR, -M/R/G, no JVD GI: BS+, soft, nontender Extremities:-Edema,-tenderness, warm to touch Neuro: Sedated, not following commands, grimaces to pain GU: Foley in place  Resolved Hospital Problem list   Septic shock  Assessment & Plan:   Acute encepahalopathy Head trauma with scalp injury secondary to fall Previously followed commands after re-warming. Suspect sedation-related. EEG negative, CT Head neg - Wean Precedex  Acute hypoxemic respiratory failure Secondary to pneumonia Extubation precluded by mental status - PRVC 8cc/kg - Wean PEEP/FIO2 for goal SpO2 88-95% or pO2 55-80 - SBT today - Antibiotics as noted below - Hold on diuresis for AKI  Hafnia Alvei pneumonia Sensitive to Cefepime, Cipro and Bactrim - F/u blood cultures - DC Vanc - Start Cefepime. Plan for 7 day course (end date 5/12)  Status-post cardiac arrest V. Fib cardiac arrest Likely preceded by hypoglycemia however initial rhythm VF so will need to consider potential ischemic event. Peak troponin 2.77. S/p hypothermia protocol on 4/30. TTE with normal EF. - Cardiology consultation appreciated. Will plan on cath when patient improves - ASA, statin  pAFRVR Dilt gtt started yesterday - Will change dilt to amiodarone per Cards - Start heparin  gtt - Telemetry  Hypokalemia - Replete  AKI Likely secondary to diuresis - Hold diuresis for today  DMII, hyperglycemia  - CBGs q4H with SSI  - Insulin gtt  Nutritional Support - TF  Best practice:  Diet: TF, N.p.o. Pain/Anxiety/Delirium protocol (if indicated): Precedex VAP protocol (if indicated): Yes DVT prophylaxis:  Heparin GI prophylaxis: Protonix Glucose control: SSI Mobility: Bedrest Code Status: Full code Family Communication: Updated son on 5/4. He will update his mother/wife of patient. Disposition: ICU  Labs   CBC: Recent Labs  Lab 08/18/18 0220 08/18/18 0526  08/20/18 0348 08/20/18 0745 08/20/18 1455 08/21/18 0306 08/22/18 0354  WBC 17.5* 14.3*  --  19.6*  --   --  10.3 9.4  HGB 12.3* 12.1*   < > 12.6* 11.6* 11.2* 10.5* 9.5*  HCT 38.4* 37.2*   < > 40.3 34.0* 33.0* 33.1* 30.1*  MCV 84.2 83.6  --  86.9  --   --  85.5 83.8  PLT 261 248  --  291  --   --  197 203   < > = values in this interval not displayed.    Basic Metabolic Panel: Recent Labs  Lab 08/18/18 0526  08/18/18 0856 08/18/18 1613 08/19/18 0209  08/19/18 1803  08/20/18 0348 08/20/18 0745 08/20/18 1455 08/21/18 0306 08/22/18 0354  NA 139   < >  --  141 141   < >  --    < > 143 144 141 141 141  K 3.2*   < >  --  5.5* 4.1   < >  --    < > 4.4 4.3 5.9* 4.1 3.3*  CL 109  --   --  112* 115*  --   --   --  114*  --   --  113* 110  CO2 20*  --   --  18* 18*  --   --   --  18*  --   --  20* 18*  GLUCOSE 133*  --   --  180* 249*  --   --   --  128*  --   --  75 251*  BUN 11  --   --  10 10  --   --   --  15  --   --  22 25*  CREATININE 0.68  --   --  1.01 0.79  --   --   --  1.22  --   --  1.13 1.26*  CALCIUM 8.0*  --   --  8.1* 7.2*  --   --   --  8.2*  --   --  8.0* 8.4*  MG 1.8  --  1.8 2.3 2.0  --  2.1  --   --   --   --   --   --   PHOS 1.7*  --  1.8* 3.7 1.7*  --  4.2  --   --   --   --   --   --    < > = values in this interval not displayed.   GFR: Estimated Creatinine Clearance: 52.3 mL/min (A) (by C-G formula based on SCr of 1.26 mg/dL (H)). Recent Labs  Lab 08/17/18 1616  08/18/18 0526 08/20/18 0348 08/20/18 0802 08/20/18 0948 08/20/18 1045 08/21/18 0306 08/22/18 0354  PROCALCITON  --   --   --   --   --  12.97  --  8.95 5.34  WBC  --    < >  14.3* 19.6*  --   --   --  10.3 9.4  LATICACIDVEN 6.7*   --   --   --  1.4  --  1.4  --   --    < > = values in this interval not displayed.    Liver Function Tests: Recent Labs  Lab 08/17/18 1532  AST 336*  ALT 257*  ALKPHOS 72  BILITOT 0.6  PROT 5.8*  ALBUMIN 3.2*   No results for input(s): LIPASE, AMYLASE in the last 168 hours. No results for input(s): AMMONIA in the last 168 hours.  ABG    Component Value Date/Time   PHART 7.293 (L) 08/20/2018 1455   PCO2ART 36.0 08/20/2018 1455   PO2ART 91.0 08/20/2018 1455   HCO3 17.4 (L) 08/20/2018 1455   TCO2 19 (L) 08/20/2018 1455   ACIDBASEDEF 8.0 (H) 08/20/2018 1455   O2SAT 95.7 08/22/2018 0355     Coagulation Profile: Recent Labs  Lab 08/17/18 1532 08/17/18 1751 08/18/18 0220 08/18/18 1613  INR 1.2 1.1 1.1 1.0    Cardiac Enzymes: Recent Labs  Lab 08/17/18 1957 08/18/18 0017 08/18/18 0526 08/18/18 1224 08/19/18 0824  TROPONINI 1.28* 2.07* 2.56* 2.77* 2.03*    HbA1C: No results found for: HGBA1C  CBG: Recent Labs  Lab 08/21/18 2337 08/22/18 0319 08/22/18 0432 08/22/18 0538 08/22/18 0638  GLUCAP 209* 253* 220* 244* 233*    The patient is critically ill with multiple organ systems failure and requires high complexity decision making for assessment and support, frequent evaluation and titration of therapies, application of advanced monitoring technologies and extensive interpretation of multiple databases.   Critical Care Time devoted to patient care services described in this note is 31 Minutes. This time reflects time of care of this signee Dr. Rodman Pickle. This critical care time does not reflect procedure time, or teaching time or supervisory time of PA/NP/Med student/Med Resident etc but could involve care discussion time.  Rodman Pickle, M.D. [facility] Pulmonary/Critical Care Medicine Pager: [phone] After hours pager: [phone]

## 2018-08-22 NOTE — Progress Notes (Signed)
ANTICOAGULATION CONSULT NOTE - Initial Consult  Pharmacy Consult for Heparin Indication: atrial fibrillation  Not on File  Patient Measurements: Height: 5\' 11"  (180.3 cm) Weight: 173 lb 4.5 oz (78.6 kg) IBW/kg (Calculated) : 75.3  Vital Signs: Temp: 100.8 F (38.2 C) (05/05 0815) Temp Source: Bladder (05/05 0800) BP: 122/56 (05/05 0805) Pulse Rate: 79 (05/05 0815)  Labs: Recent Labs    08/20/18 0348  08/20/18 1455 08/21/18 0306 08/22/18 0354  HGB 12.6*   < > 11.2* 10.5* 9.5*  HCT 40.3   < > 33.0* 33.1* 30.1*  PLT 291  --   --  197 203  CREATININE 1.22  --   --  1.13 1.26*   < > = values in this interval not displayed.    Estimated Creatinine Clearance: 52.3 mL/min (A) (by C-G formula based on SCr of 1.26 mg/dL (H)).  Assessment: [name] admitted 08/17/18 with out of hospital VFib arrest. Pharmacy now consulted to dose heparin for Afib. Pt not on AC PTA. Hgb trending down to 9.5, plts ok, SCR up to 1.26.   Pharmacy also consulted for cefepime dosing for HCAP with TA growing hafnia alvei. WBC WNL, febrile tmax 100.5, CrCl ~52 ml/min.  Goal of Therapy:  Heparin level 0.3-0.7 units/ml Monitor platelets by anticoagulation protocol: Yes   Plan:  Give 3000 units bolus x 1 Start heparin infusion at 1200 units/hr  Check Heparin level in 6 hours Monitor daily CBC, heparin level, s/sx bleeding Start cefepime 2g Q12hrs  Monitor renal function, abx LOT   Juanell Fairly, PharmD PGY1 Pharmacy Resident 08/22/2018 9:52 AM

## 2018-08-22 NOTE — Progress Notes (Signed)
Progress Note  Patient Name: [name] [name] Date of Encounter: 08/22/2018  Primary Cardiologist: No primary care provider on file.   Subjective   Intubated, sedated.  Had a few hours of rapid atrial fibrillation yesterday evening again.  Inpatient Medications    Scheduled Meds: . aspirin  81 mg Per Tube Daily  . atorvastatin  80 mg Per Tube q1800  . chlorhexidine gluconate (MEDLINE KIT)  15 mL Mouth Rinse BID  . Chlorhexidine Gluconate Cloth  6 each Topical Daily  . diltiazem  10 mg Intravenous STAT  . heparin  5,000 Units Subcutaneous Q8H  . mouth rinse  15 mL Mouth Rinse 10 times per day  . midazolam  2 mg Intravenous Once  . pantoprazole (PROTONIX) IV  40 mg Intravenous QHS  . potassium chloride  20 mEq Per Tube Q4H   Continuous Infusions: . sodium chloride Stopped (08/18/18 1328)  . sodium chloride Stopped (08/22/18 0631)  . dexmedetomidine (PRECEDEX) IV infusion 0.6 mcg/kg/hr (08/22/18 0700)  . diltiazem (CARDIZEM) infusion 5 mg/hr (08/22/18 0700)  . feeding supplement (VITAL AF 1.2 CAL) 1,000 mL (08/21/18 1446)  . fentaNYL infusion INTRAVENOUS Stopped (08/21/18 1716)  . insulin 5.2 mL/hr at 08/22/18 0700  . midazolam Stopped (08/21/18 0539)  . norepinephrine (LEVOPHED) Adult infusion Stopped (08/20/18 2102)  . piperacillin-tazobactam (ZOSYN)  IV 12.5 mL/hr at 08/22/18 0700  . propofol (DIPRIVAN) infusion Stopped (08/21/18 0727)  . vancomycin Stopped (08/22/18 4034)  . vasopressin (PITRESSIN) infusion - *FOR SHOCK* Stopped (08/21/18 0630)   PRN Meds: Place/Maintain arterial line **AND** sodium chloride, acetaminophen (TYLENOL) oral liquid 160 mg/5 mL, fentaNYL, ipratropium-albuterol, metoprolol tartrate, sodium chloride flush   Vital Signs    Vitals:   08/22/18 0700 08/22/18 0715 08/22/18 0716 08/22/18 0730  BP: (!) 145/86     Pulse: (!) 113 (!) 113 (!) 111 (!) 114  Resp: (!) 30   (!) 24  Temp: (!) 101.1 F (38.4 C) (!) 100.9 F (38.3 C) (!) 100.9 F (38.3  C) (!) 100.8 F (38.2 C)  TempSrc:      SpO2: 96% 95% 96% 96%  Weight:      Height:        Intake/Output Summary (Last 24 hours) at 08/22/2018 0801 Last data filed at 08/22/2018 0700 Gross per 24 hour  Intake 1045.62 ml  Output 1665 ml  Net -619.38 ml   Last 3 Weights 08/22/2018 08/21/2018 08/20/2018  Weight (lbs) 173 lb 4.5 oz 174 lb 2.6 oz 174 lb 6.1 oz  Weight (kg) 78.6 kg 79 kg 79.1 kg      Telemetry    Atrial fibrillation with RVR yesterday evening, converted to sinus rhythm with few brief runs of atrial fibrillation this morning - Personally Reviewed   Physical Exam  Intubated, sedated GEN: No acute distress.   Neck: No JVD Cardiac: RRR, no murmurs, rubs, or gallops.  Respiratory:  Diminished in the bases to auscultation bilaterally. GI: Soft, no masses, non-distended  MS: No edema; No deformity. Neuro:   Sedated Skin: No rash  Labs    Chemistry Recent Labs  Lab 08/17/18 1532  08/20/18 0348  08/20/18 1455 08/21/18 0306 08/22/18 0354  NA 141   < > 143   < > 141 141 141  K 3.4*   < > 4.4   < > 5.9* 4.1 3.3*  CL 106   < > 114*  --   --  113* 110  CO2 15*   < > 18*  --   --  20* 18*  GLUCOSE 216*   < > 128*  --   --  75 251*  BUN 9   < > 15  --   --  22 25*  CREATININE 1.32*   < > 1.22  --   --  1.13 1.26*  CALCIUM 8.7*   < > 8.2*  --   --  8.0* 8.4*  PROT 5.8*  --   --   --   --   --   --   ALBUMIN 3.2*  --   --   --   --   --   --   AST 336*  --   --   --   --   --   --   ALT 257*  --   --   --   --   --   --   ALKPHOS 72  --   --   --   --   --   --   BILITOT 0.6  --   --   --   --   --   --   GFRNONAA 52*   < > 57*  --   --  >60 55*  GFRAA 60*   < > >60  --   --  >60 >60  ANIONGAP 20*   < > 11  --   --  8 13   < > = values in this interval not displayed.     Hematology Recent Labs  Lab 08/20/18 0348  08/20/18 1455 08/21/18 0306 08/22/18 0354  WBC 19.6*  --   --  10.3 9.4  RBC 4.64  --   --  3.87* 3.59*  HGB 12.6*   < > 11.2* 10.5* 9.5*  HCT  40.3   < > 33.0* 33.1* 30.1*  MCV 86.9  --   --  85.5 83.8  MCH 27.2  --   --  27.1 26.5  MCHC 31.3  --   --  31.7 31.6  RDW 14.6  --   --  14.9 14.8  PLT 291  --   --  197 203   < > = values in this interval not displayed.    Cardiac Enzymes Recent Labs  Lab 08/18/18 0017 08/18/18 0526 08/18/18 1224 08/19/18 0824  TROPONINI 2.07* 2.56* 2.77* 2.03*   No results for input(s): TROPIPOC in the last 168 hours.   BNPNo results for input(s): BNP, PROBNP in the last 168 hours.   DDimer No results for input(s): DDIMER in the last 168 hours.   Radiology    Dg Abd 1 View  Result Date: 08/21/2018 CLINICAL DATA:  Check feeding catheter placement EXAM: ABDOMEN - 1 VIEW COMPARISON:  None. FINDINGS: Scattered large and small bowel gas is noted. Gastric catheter is noted within the distal stomach/proximal duodenum. The feeding catheter is been placed and lies within the fourth portion of the duodenum. No free air is seen. No bony abnormality is noted. IMPRESSION: Feeding catheter within the fourth portion of the duodenum. Electronically Signed   By: Mark  Lukens M.D.   On: 08/21/2018 16:00   Ct Head Wo Contrast  Result Date: 08/21/2018 CLINICAL DATA:  Unexplained altered level of consciousness. EXAM: CT HEAD WITHOUT CONTRAST TECHNIQUE: Contiguous axial images were obtained from the base of the skull through the vertex without intravenous contrast. COMPARISON:  08/17/2018. FINDINGS: Brain: No evidence for acute infarction, hemorrhage, mass lesion, hydrocephalus, or extra-axial fluid. Generalized atrophy. Hypoattenuation of white matter, likely   small vessel disease. Vascular: Calcification of the cavernous internal carotid arteries consistent with cerebrovascular atherosclerotic disease. No signs of intracranial large vessel occlusion. Skull: Normal. Negative for fracture or focal lesion. Sinuses/Orbits: Layering fluid in both divisions of the sphenoid as well as the LEFT maxillary sinus. Other: BILATERAL  middle ear and mastoid fluid. Continued endotracheal intubation. IMPRESSION: Atrophy and small vessel disease similar to priors. No acute intracranial findings. Layering sinus fluid as well as BILATERAL middle ear and mastoid fluid. Continued surveillance warranted. Electronically Signed   By: John T Curnes M.D.   On: 08/21/2018 14:33    Patient Profile     77 y.o. male with type 2 diabetes, hypertension, and mixed hyperlipidemia, admitted 08/17/2018 with out of hospital ventricular fibrillation arrest  Assessment & Plan    1.  Out of hospital ventricular fibrillation arrest: ROSC after 11 minutes of CPR with shocks and epi administered as well.    Patient status post hypothermia per protocol, reportedly was following commands before developing hypotension and fever.  Has been sedated since that time.   2. Elevated troponin suspect demand ischemia: Anticipate cardiac catheterization once he recovers from his critical illness.  3.  Atrial fibrillation with RVR: Recurrence of atrial fibrillation noted last night with few brief runs this morning.  Patient now on a diltiazem drip.  He is at high risk of recurrent atrial fibrillation during his critical illness and subsequent hospitalization.  Favor IV amiodarone with conversion to oral once he is taking p.o.  Will transition from IV diltiazem to IV amiodarone today.  Will start IV heparin with plans to convert to a DOAC once he is improving.  This patients CHA2DS2-VASc Score and unadjusted Ischemic Stroke Rate (% per year) is equal to 7.2 % stroke rate/year from a score of 5  Above score calculated as 1 point each if present [CHF, HTN, DM, Vascular=MI/PAD/Aortic Plaque, Age if 65-74, or Male] Above score calculated as 2 points each if present [Age > 75, or Stroke/TIA/TE]  4.  Acute hypoxemic respiratory failure: Per CCM team  5.  Acute diastolic heart failure: To start oral furosemide today.  Patient has AKI.  Will likely give IV diuretic once  he is closer to extubation.  Will be cautious in the setting of his AKI.  The patient is critically ill with multiple organ systems failure and requires high complexity decision making for assessment and support, frequent evaluation and titration of therapies, application of advanced monitoring technologies and extensive interpretation of multiple databases. Pt's case discussed with RN and with Dr Ellison of CCM.  Critical Care Time devoted to patient care services described in this note is 35 minutes.    For questions or updates, please contact [facility] Please consult [email or web address] for contact info under        Signed,  , MD  08/22/2018, 8:01 AM    

## 2018-08-23 DIAGNOSIS — G9340 Encephalopathy, unspecified: Secondary | ICD-10-CM

## 2018-08-23 DIAGNOSIS — I10 Essential (primary) hypertension: Secondary | ICD-10-CM

## 2018-08-23 LAB — CBC
HCT: 29 % — ABNORMAL LOW (ref 39.0–52.0)
Hemoglobin: 9.2 g/dL — ABNORMAL LOW (ref 13.0–17.0)
MCH: 26.7 pg (ref 26.0–34.0)
MCHC: 31.7 g/dL (ref 30.0–36.0)
MCV: 84.1 fL (ref 80.0–100.0)
Platelets: 186 10*3/uL (ref 150–400)
RBC: 3.45 MIL/uL — ABNORMAL LOW (ref 4.22–5.81)
RDW: 15 % (ref 11.5–15.5)
WBC: 8 10*3/uL (ref 4.0–10.5)
nRBC: 0 % (ref 0.0–0.2)

## 2018-08-23 LAB — BASIC METABOLIC PANEL
Anion gap: 13 (ref 5–15)
Anion gap: 10 (ref 5–15)
BUN: 28 mg/dL — ABNORMAL HIGH (ref 8–23)
BUN: 29 mg/dL — ABNORMAL HIGH (ref 8–23)
CO2: 20 mmol/L — ABNORMAL LOW (ref 22–32)
CO2: 20 mmol/L — ABNORMAL LOW (ref 22–32)
Calcium: 8.7 mg/dL — ABNORMAL LOW (ref 8.9–10.3)
Calcium: 8.6 mg/dL — ABNORMAL LOW (ref 8.9–10.3)
Chloride: 111 mmol/L (ref 98–111)
Chloride: 113 mmol/L — ABNORMAL HIGH (ref 98–111)
Creatinine, Ser: 0.95 mg/dL (ref 0.61–1.24)
Creatinine, Ser: 1.03 mg/dL (ref 0.61–1.24)
GFR calc Af Amer: >60 mL/min (ref >60)
GFR calc Af Amer: >60 mL/min (ref >60)
GFR calc non Af Amer: >60 mL/min (ref >60)
GFR calc non Af Amer: >60 mL/min (ref >60)
Glucose, Bld: 208 mg/dL — ABNORMAL HIGH (ref 70–99)
Glucose, Bld: 163 mg/dL — ABNORMAL HIGH (ref 70–99)
Potassium: 3.1 mmol/L — ABNORMAL LOW (ref 3.5–5.1)
Potassium: 3 mmol/L — ABNORMAL LOW (ref 3.5–5.1)
Sodium: 144 mmol/L (ref 135–145)
Sodium: 143 mmol/L (ref 135–145)

## 2018-08-23 LAB — COOXEMETRY PANEL
Carboxyhemoglobin: 1 % (ref 0.5–1.5)
Methemoglobin: 1.2 % (ref 0.0–1.5)
O2 Saturation: 49 %
Total hemoglobin: 9.6 g/dL — ABNORMAL LOW (ref 12.0–16.0)

## 2018-08-23 LAB — GLUCOSE, CAPILLARY
Glucose-Capillary: 201 mg/dL — ABNORMAL HIGH (ref 70–99)
Glucose-Capillary: 224 mg/dL — ABNORMAL HIGH (ref 70–99)
Glucose-Capillary: 202 mg/dL — ABNORMAL HIGH (ref 70–99)
Glucose-Capillary: 181 mg/dL — ABNORMAL HIGH (ref 70–99)
Glucose-Capillary: 148 mg/dL — ABNORMAL HIGH (ref 70–99)
Glucose-Capillary: 173 mg/dL — ABNORMAL HIGH (ref 70–99)
Glucose-Capillary: 200 mg/dL — ABNORMAL HIGH (ref 70–99)

## 2018-08-23 LAB — MAGNESIUM: Magnesium: 1.9 mg/dL (ref 1.7–2.4)

## 2018-08-23 LAB — HEPARIN LEVEL (UNFRACTIONATED)
Heparin Unfractionated: 0.29 IU/mL — ABNORMAL LOW (ref 0.30–0.70)
Heparin Unfractionated: 0.34 IU/mL (ref 0.30–0.70)
Heparin Unfractionated: 0.23 IU/mL — ABNORMAL LOW (ref 0.30–0.70)

## 2018-08-23 MED ORDER — AMIODARONE IV BOLUS ONLY 150 MG/100ML
150.0000 mg | Freq: Once | INTRAVENOUS | Status: AC
  Administered 2018-08-23: 150 mg via INTRAVENOUS

## 2018-08-23 MED ORDER — NON FORMULARY: 3.0000 mg | Freq: Every evening | Status: DC | PRN

## 2018-08-23 MED ORDER — POTASSIUM CHLORIDE 20 MEQ/15ML (10%) PO SOLN
30.0000 meq | ORAL | Status: AC
  Administered 2018-08-23 (×2): 30 meq
  Filled 2018-08-23 (×2): qty 30

## 2018-08-23 MED ORDER — METOPROLOL TARTRATE 5 MG/5ML IV SOLN
2.5000 mg | INTRAVENOUS | Status: DC | PRN
  Administered 2018-08-23: 2.5 mg via INTRAVENOUS
  Administered 2018-08-23 – 2018-08-24 (×3): 5 mg via INTRAVENOUS
  Filled 2018-08-23 (×4): qty 5

## 2018-08-23 MED ORDER — MELATONIN 3 MG PO TABS
3.0000 mg | ORAL_TABLET | Freq: Every evening | ORAL | Status: DC | PRN
  Administered 2018-08-23 – 2018-08-27 (×5): 3 mg via ORAL
  Filled 2018-08-23 (×9): qty 1

## 2018-08-23 MED ORDER — POTASSIUM CHLORIDE 10 MEQ/50ML IV SOLN
10.0000 meq | INTRAVENOUS | Status: AC
  Administered 2018-08-23 – 2018-08-24 (×4): 10 meq via INTRAVENOUS
  Filled 2018-08-23 (×4): qty 50

## 2018-08-23 MED ORDER — ORAL CARE MOUTH RINSE
15.0000 mL | Freq: Two times a day (BID) | OROMUCOSAL | Status: DC
  Administered 2018-08-23 – 2018-08-24 (×2): 15 mL via OROMUCOSAL

## 2018-08-23 MED ORDER — RESOURCE THICKENUP CLEAR PO POWD
ORAL | Status: DC | PRN
  Filled 2018-08-23: qty 125

## 2018-08-23 MED ORDER — FUROSEMIDE 10 MG/ML IJ SOLN
20.0000 mg | Freq: Once | INTRAMUSCULAR | Status: AC
  Administered 2018-08-23: 10:00:00 20 mg via INTRAVENOUS
  Filled 2018-08-23: qty 2

## 2018-08-23 NOTE — Progress Notes (Signed)
ANTICOAGULATION CONSULT NOTE - Initial Consult  Pharmacy Consult for Heparin Indication: atrial fibrillation  Not on File  Patient Measurements: Height: 5\' 11"  (180.3 cm) Weight: 174 lb 6.1 oz (79.1 kg) IBW/kg (Calculated) : 75.3  Vital Signs: Temp: 99.5 F (37.5 C) (05/06 1330) Temp Source: Bladder (05/06 1200) BP: 134/63 (05/06 1300) Pulse Rate: 104 (05/06 1330)  Labs: Recent Labs    08/21/18 0306 08/22/18 0354  08/22/18 2151 08/23/18 0430 08/23/18 1240  HGB 10.5* 9.5*  --   --  9.2*  --   HCT 33.1* 30.1*  --   --  29.0*  --   PLT 197 203  --   --  186  --   HEPARINUNFRC  --   --    < > 0.27* 0.29* 0.34  CREATININE 1.13 1.26*  --   --  0.95 1.03   < > = values in this interval not displayed.    Estimated Creatinine Clearance: 64 mL/min (by C-G formula based on SCr of 1.03 mg/dL).  Assessment: [name] admitted 08/17/18 with out of hospital VFib arrest. Pharmacy now consulted to dose heparin for Afib. Pt not on AC PTA.   Heparin on lower end of therapeutic range at 0.34 on 1400 units/hr. Hgb stable at 9.2, plts ok, SCR down to 1.03, no reports of bleeding per RN.  Goal of Therapy:  Heparin level 0.3-0.7 units/ml Monitor platelets by anticoagulation protocol: Yes   Plan:  Increase heparin to heparin infusion at 1450 units/hr  Check Heparin level in 6 hours Monitor daily CBC, heparin level, s/sx bleeding   Juanell Fairly, PharmD PGY1 Pharmacy Resident 08/23/2018 1:46 PM

## 2018-08-23 NOTE — Procedures (Signed)
Extubation Procedure Note  Patient Details:   Name: RIYAD KEENA DOB: 04/29/1940 MRN: [id number]   Airway Documentation:    Vent end date: 08/23/18 Vent end time: 0948   Evaluation  O2 sats: stable throughout Complications: No apparent complications Patient did tolerate procedure well. Bilateral Breath Sounds: Clear   Yes.  Pt extubated per physician order. Pt with cuff leak, able to lift head and stick tongue out. Once extubated pt able to speak name, good cough and no stridor was heard. Pt placed on 6L nasal cannula and was switched to 12L HFNC shortly after. Incentive spirometry initiated with pt. 1250 ml achieved. Pt tolerated well.   [place] 08/23/2018, 10:07 AM

## 2018-08-23 NOTE — Progress Notes (Signed)
Nutrition Follow-up  RD working remotely.  DOCUMENTATION CODES:   Not applicable  INTERVENTION:   - If diet advanced, Ensure Enlive po BID, each supplement provides 350 kcal and 20 grams of protein  If unable to advance diet or PO intake is poor, recommend restarting TF via Cortrak: - Osmolite 1.5 @ 45 ml/hr (1080 ml/day) - Pro-stat 30 ml TID  Recommended tube feeding regimen provides 1920 kcal, 113 grams of protein, and 823 ml of H2O (100% of needs).  NUTRITION DIAGNOSIS:   Inadequate oral intake related to inability to eat as evidenced by NPO status.  Ongoing  GOAL:   Patient will meet greater than or equal to 90% of their needs  Unmet at this time  MONITOR:   Diet advancement, Weight trends, I & O's, Labs  REASON FOR ASSESSMENT:   Ventilator, Consult Enteral/tube feeding initiation and management  ASSESSMENT:   78 year old male who presented to the ED on 4/30 after a witnessed cardiac arrest. Immediate bystander CPR initiated. Pt found to be in V-fib on EMS arrival and was given 1 round of Epi and 2 shocks. ROSC achieved after ~11 minutes of CPR. Pt intubated in the field. Pt transferred to the ICU and TTM initiated. PMH of T2DM.  5/04 - post-pyloric Cortrak placed 5/06 - extubated  Pt remains NPO after extubation.  Per Cardiology, likely proceed with cardiac cath once pt is stable.  Weight stable since admission. Reviewed RN edema assessment. Pt with moderate pitting generalized edema.  Discussed pt with RN. Cortrak remains in place but no TF infusing at this time per MD. RD to leave TF recommendations if pt unable to have diet advanced. Per RN, SLP to hopefully see pt today.  Medications reviewed and include: SSI q 4 hours, Levemir 10 units BID, Protonix, IV abx, amiodarone IVF: NS @ 10 ml/hr  Labs reviewed: potassium 3.0 (L), BUN 29 (H), hemoglobin 9.2 (L) CBG's: 148, 181, 202, 224, 235, 143 x 24 hours  UOP: 1255 ml x 24 hours I/O's: +2.5 L since  admit  Diet Order:   Diet Order            Diet NPO time specified  Diet effective now              EDUCATION NEEDS:   Not appropriate for education at this time  Skin:  Skin Assessment: Reviewed RN Assessment  Last BM:  08/17/18  Height:   Ht Readings from Last 1 Encounters:  08/17/18 5\' 11"  (1.803 m)    Weight:   Wt Readings from Last 1 Encounters:  08/23/18 79.1 kg    Ideal Body Weight:  78.2 kg  BMI:  Body mass index is 24.32 kg/m.  Estimated Nutritional Needs:   Kcal:  1900-2100  Protein:  100-115 grams  Fluid:  >/= 1.9 L    Gaynell Face, MS, RD, LDN Inpatient Clinical Dietitian Pager: [phone] Weekend/After Hours: [phone]

## 2018-08-23 NOTE — Progress Notes (Signed)
Progress Note  Patient Name: [name] [name] Date of Encounter: 08/23/2018  Primary Cardiologist: No primary care provider on file.   Subjective   Intubated, sedated  Inpatient Medications    Scheduled Meds: . amLODipine  10 mg Oral Daily  . aspirin  81 mg Per Tube Daily  . atorvastatin  80 mg Per Tube q1800  . chlorhexidine gluconate (MEDLINE KIT)  15 mL Mouth Rinse BID  . Chlorhexidine Gluconate Cloth  6 each Topical Daily  . furosemide  20 mg Intravenous Once  . insulin aspart  2-6 Units Subcutaneous Q4H  . insulin detemir  10 Units Subcutaneous Q12H  . mouth rinse  15 mL Mouth Rinse 10 times per day  . midazolam  2 mg Intravenous Once  . pantoprazole (PROTONIX) IV  40 mg Intravenous QHS   Continuous Infusions: . sodium chloride Stopped (08/18/18 1328)  . sodium chloride 10 mL/hr at 08/23/18 0800  . amiodarone 30 mg/hr (08/23/18 0800)  . ceFEPime (MAXIPIME) IV Stopped (08/22/18 2152)  . dexmedetomidine (PRECEDEX) IV infusion 0.2 mcg/kg/hr (08/23/18 0800)  . feeding supplement (VITAL AF 1.2 CAL) 1,000 mL (08/21/18 1446)  . heparin 1,400 Units/hr (08/23/18 0800)   PRN Meds: Place/Maintain arterial line **AND** sodium chloride, acetaminophen (TYLENOL) oral liquid 160 mg/5 mL, fentaNYL (SUBLIMAZE) injection, hydrALAZINE, ipratropium-albuterol, sodium chloride flush   Vital Signs    Vitals:   08/23/18 0728 08/23/18 0730 08/23/18 0800 08/23/18 0830  BP: (!) 99/50  90/60   Pulse: 60 60 (!) 58 64  Resp: _0 Temp:  98.8 F (37.1 C) 98.6 F (37 C) 98.6 F (37 C)  TempSrc:   Bladder   SpO2: 100% 99% 98% 98%  Weight:      Height:        Intake/Output Summary (Last 24 hours) at 08/23/2018 0928 Last data filed at 08/23/2018 0800 Gross per 24 hour  Intake 2402.31 ml  Output 1240 ml  Net 1162.31 ml   Last 3 Weights 08/23/2018 08/22/2018 08/21/2018  Weight (lbs) 174 lb 6.1 oz 173 lb 4.5 oz 174 lb 2.6 oz  Weight (kg) 79.1 kg 78.6 kg 79 kg      Telemetry     Normal sinus rhythm, no atrial fibrillation over the last 12 hours.- Personally Reviewed   Physical Exam  Elderly male, intubated but awake today and following commands gesturing that he wants ET tube out GEN: No acute distress.   Neck: No JVD Cardiac: RRR, no murmurs, rubs, or gallops.  Respiratory: Clear to auscultation bilaterally. GI: Soft, nontender, non-distended  MS: No edema; No deformity. Neuro:   Following commands, moving all extremities.  Labs    Chemistry Recent Labs  Lab 08/17/18 1532  08/21/18 0306 08/22/18 0354 08/23/18 0430  NA 141   < > 141 141 144  K 3.4*   < > 4.1 3.3* 3.1*  CL 106   < > 113* 110 111  CO2 15*   < > 20* 18* 20*  GLUCOSE 216*   < > 75 251* 208*  BUN 9   < > 22 25* 28*  CREATININE 1.32*   < > 1.13 1.26* 0.95  CALCIUM 8.7*   < > 8.0* 8.4* 8.7*  PROT 5.8*  --   --   --   --   ALBUMIN 3.2*  --   --   --   --   AST 336*  --   --   --   --  ALT 257*  --   --   --   --   ALKPHOS 72  --   --   --   --   BILITOT 0.6  --   --   --   --   GFRNONAA 52*   < > >60 55* >60  GFRAA 60*   < > >60 >60 >60  ANIONGAP 20*   < > _0 < > = values in this interval not displayed.     Hematology Recent Labs  Lab 08/21/18 0306 08/22/18 0354 08/23/18 0430  WBC 10.3 9.4 8.0  RBC 3.87* 3.59* 3.45*  HGB 10.5* 9.5* 9.2*  HCT 33.1* 30.1* 29.0*  MCV 85.5 83.8 84.1  MCH 27.1 26.5 26.7  MCHC 31.7 31.6 31.7  RDW 14.9 14.8 15.0  PLT 197 203 186    Cardiac Enzymes Recent Labs  Lab 08/18/18 0017 08/18/18 0526 08/18/18 1224 08/19/18 0824  TROPONINI 2.07* 2.56* 2.77* 2.03*   No results for input(s): TROPIPOC in the last 168 hours.   BNPNo results for input(s): BNP, PROBNP in the last 168 hours.   DDimer No results for input(s): DDIMER in the last 168 hours.   Radiology    Dg Abd 1 View  Result Date: 08/21/2018 CLINICAL DATA:  Check feeding catheter placement EXAM: ABDOMEN - 1 VIEW COMPARISON:  None. FINDINGS: Scattered large and small bowel  gas is noted. Gastric catheter is noted within the distal stomach/proximal duodenum. The feeding catheter is been placed and lies within the fourth portion of the duodenum. No free air is seen. No bony abnormality is noted. IMPRESSION: Feeding catheter within the fourth portion of the duodenum. Electronically Signed   By: Inez Catalina M.D.   On: 08/21/2018 16:00   Ct Head Wo Contrast  Result Date: 08/21/2018 CLINICAL DATA:  Unexplained altered level of consciousness. EXAM: CT HEAD WITHOUT CONTRAST TECHNIQUE: Contiguous axial images were obtained from the base of the skull through the vertex without intravenous contrast. COMPARISON:  08/17/2018. FINDINGS: Brain: No evidence for acute infarction, hemorrhage, mass lesion, hydrocephalus, or extra-axial fluid. Generalized atrophy. Hypoattenuation of white matter, likely small vessel disease. Vascular: Calcification of the cavernous internal carotid arteries consistent with cerebrovascular atherosclerotic disease. No signs of intracranial large vessel occlusion. Skull: Normal. Negative for fracture or focal lesion. Sinuses/Orbits: Layering fluid in both divisions of the sphenoid as well as the LEFT maxillary sinus. Other: BILATERAL middle ear and mastoid fluid. Continued endotracheal intubation. IMPRESSION: Atrophy and small vessel disease similar to priors. No acute intracranial findings. Layering sinus fluid as well as BILATERAL middle ear and mastoid fluid. Continued surveillance warranted. Electronically Signed   By: Staci Righter M.D.   On: 08/21/2018 14:33    Patient Profile     78 y.o. male with type 2 diabetes, hypertension, and mixed hyperlipidemia, admitted 08/17/2018 with out of hospital ventricular fibrillation arrest  Assessment & Plan    1. Out of hospital ventricular fibrillation arrest: Likely proceed with cardiac catheterization once he is extubated and stable.  2.Elevated troponin suspect demand ischemia: Anticipate cardiac  catheterization once he recovers from his critical illness.  3. Atrial fibrillation with RVR:  Now maintaining sinus rhythm on IV amiodarone.  Likely convert to oral amiodarone once he is extubated and taking p.o.  Continue heparin for anticoagulation.  Once he undergoes cardiac catheterization, will transition to a direct oral anticoagulant drug.  This patients CHA2DS2-VASc Score and unadjusted Ischemic Stroke Rate (% per year) is equal to  7.2 % stroke rate/year from a score of 5  Above score calculated as 1 point each if present [CHF, HTN, DM, Vascular=MI/PAD/Aortic Plaque, Age if 65-74, or Male] Above score calculated as 2 points each if present [Age > 75, or Stroke/TIA/TE]  4. Acute hypoxemic respiratory failure: Per CCM team.  Plans noted for extubation this morning.  5.  Acute diastolic heart failure: IV furosemide given this morning with plans for extubation today.  Continue to diurese as tolerated.      For questions or updates, please contact [place] Please consult [email or web address] for contact info under        Signed, Sherren Mocha, MD  08/23/2018, 9:28 AM

## 2018-08-23 NOTE — Progress Notes (Signed)
Discussed with Dr. Burt Knack on evening rounds about pt intermittently going into afib with rates in the 140's. RN can manage with PRN lopressor as well as re bolusing 150 mg of amio once if needed. Will pass along to nightshift RN

## 2018-08-23 NOTE — Progress Notes (Signed)
ANTICOAGULATION CONSULT NOTE - Follow-Up Consult  Pharmacy Consult for Heparin Indication: atrial fibrillation  Not on File  Patient Measurements: Height: 5\' 11"  (180.3 cm) Weight: 174 lb 6.1 oz (79.1 kg) IBW/kg (Calculated) : 75.3  Vital Signs: Temp: 98.4 F (36.9 C) (05/06 2000) Temp Source: Oral (05/06 2000) BP: 137/72 (05/06 2000) Pulse Rate: 106 (05/06 2000)  Labs: Recent Labs    08/21/18 0306 08/22/18 0354  08/23/18 0430 08/23/18 1240 08/23/18 2043  HGB 10.5* 9.5*  --  9.2*  --   --   HCT 33.1* 30.1*  --  29.0*  --   --   PLT 197 203  --  186  --   --   HEPARINUNFRC  --   --    < > 0.29* 0.34 0.23*  CREATININE 1.13 1.26*  --  0.95 1.03  --    < > = values in this interval not displayed.    Estimated Creatinine Clearance: 64 mL/min (by C-G formula based on SCr of 1.03 mg/dL).  Assessment: [name] admitted 08/17/18 with out of hospital VFib arrest. Pharmacy now consulted to dose heparin for Afib. Pt not on AC PTA.   Heparin now subtherapeutic despite rate adjustment this morning, confirmed no issues with infusion/lines per RN.  Goal of Therapy:  Heparin level 0.3-0.7 units/ml Monitor platelets by anticoagulation protocol: Yes   Plan:  Increase heparin to heparin infusion to 1650 units/hr  Recheck heparin level with morning labs   Arrie Senate, PharmD, BCPS Clinical Pharmacist [phone] Please check AMION for all [place] numbers 08/23/2018

## 2018-08-23 NOTE — Progress Notes (Signed)
Pt spoke with wife via phone. Able to communicate effectively with her. Wife expressed appreciation and was updated on current plan of care.

## 2018-08-23 NOTE — Progress Notes (Addendum)
NAME:  [name] [name], MRN:  [id number], DOB:  [birth year], LOS: 6 ADMISSION DATE:  08/17/2018, CONSULTATION DATE:  08/17/2018 REFERRING MD: ED provider, CHIEF COMPLAINT: Post cardiac arrest  Brief History   78 year old male with DM who had a witnessed collapse. Bystander reported complaints about fatigue and concern about his sugar. CPR initiated immediately. ACLS crew arrived within 5 minutes. Total of 11 minutes prior to ROSC. Required 2 shocks for V. Fib Comatose on presentation to the emergency department  History of present illness   Denies any complaints of fever, cough, shortness of breath prior to leaving for the store  Past Medical History  Diabetes-according to family well-controlled  Significant Hospital Events     Consults:  cardiology  Procedures:    Significant Diagnostic Tests:  CT head -  No acute intracranial pathology.  EKG did not reveal any ST T wave changes TTE 5/3 - EF 55-60%. Impaired relaxation.  Micro Data:  4/30 [place] - neg 5/3 [place], pending 5/3 Trach asp - Hafnia Alvei. Sensitive to Cefepime, Cipro and Bactrim  Antimicrobials:  Azithromycin 08/17/2018 once Ceftriaxone 08/17/2018 once Unasyn 5/1>>5/3 Vanc 5/3>>5/5 Zosyn 5/3>>5/5 Cefepime >>5/5  Interim history/subjective:  Afebrile x 24 hours. Tolerating SBT on Precedex this morning   Objective   Blood pressure 96/64, pulse (!) 58, temperature 98.2 F (36.8 C), temperature source Core, resp. rate 20, height 5\' 11"  (1.803 m), weight 79.1 kg, SpO2 100 %. CVP:  [5 mmHg-9 mmHg] 8 mmHg  Vent Mode: PRVC FiO2 (%):  [40 %-50 %] 40 % Set Rate:  [20 bmp] 20 bmp Vt Set:  [600 mL] 600 mL PEEP:  [8 cmH20] 8 cmH20 Pressure Support:  [15 cmH20] 15 cmH20 Plateau Pressure:  [19 cmH20-26 cmH20] 20 cmH20   Intake/Output Summary (Last 24 hours) at 08/23/2018 0744 Last data filed at 08/23/2018 0700 Gross per 24 hour  Intake 2387.39 ml  Output 1255 ml  Net 1132.39 ml    Filed Weights   08/21/18 0330  08/22/18 0500 08/23/18 0500  Weight: 79 kg 78.6 kg 79.1 kg   Physical Exam: General: Elderly-appearing, no acute distress, on mechanical ventilation HENT: [place], AT, ETT in place Eyes: EOMI, no scleral icterus Respiratory: Diminished breath sounds bilaterally.  No crackles, wheezing or rales Cardiovascular: RRR, -M/R/G, no JVD GI: BS+, soft, nontender Extremities:-Edema,-tenderness Neuro: Awake, alert and following commands consistently, moves extremities x 4  Skin: Intact, no rashes or bruising GU: Foley in place  Resolved Hospital Problem list   Septic shock  Assessment & Plan:   Acute encepahalopathy Head trauma with scalp injury secondary to fall Previously followed commands after re-warming. Suspect sedation-related. EEG negative, CT Head neg Awake and following commands this morning - Wean Precedex  Acute hypoxemic respiratory failure Secondary to pneumonia - SBT - Consider extubation today - Antibiotics as noted below - Gentle diuresis  Hafnia Alvei pneumonia Sensitive to Cefepime, Cipro and Bactrim - F/u blood cultures - Continue Cefepime. Plan for 7 day course (end date 5/12)  Status-post cardiac arrest V. Fib cardiac arrest Likely preceded by hypoglycemia however initial rhythm VF so will need to consider potential ischemic event. Peak troponin 2.77. S/p hypothermia protocol on 4/30. TTE with normal EF. - Cardiology consultation appreciated. Will plan on cath when patient improves - ASA, statin  pAFRVR - Amiodarone per Cards - Heparin gtt - Telemetry  Hypertension - Amlodipine - PRN hydralazine  Hypokalemia - Replete - BMP at 1400  AKI Likely secondary to diuresis.  Improving - Lasix 20 mg once  DMII, hyperglycemia  - CBGs q4H with SSI  - Off Insulin gtt  Nutritional Support - Hold TF - Swallow eval after extubation  Best practice:  Diet: TF, N.p.o. Pain/Anxiety/Delirium protocol (if indicated): Precedex VAP protocol (if indicated): Yes  DVT prophylaxis: Heparin GI prophylaxis: Protonix Glucose control: SSI Mobility: Bedrest Code Status: Full code Family Communication: Updated son on 5/6. He is happy to hear his dad may potentially be extubated. He does wish to keep him full code. He will update his mother/wife of patient. Disposition: ICU  Labs   CBC: Recent Labs  Lab 08/18/18 0526  08/20/18 0348 08/20/18 0745 08/20/18 1455 08/21/18 0306 08/22/18 0354 08/23/18 0430  WBC 14.3*  --  19.6*  --   --  10.3 9.4 8.0  HGB 12.1*   < > 12.6* 11.6* 11.2* 10.5* 9.5* 9.2*  HCT 37.2*   < > 40.3 34.0* 33.0* 33.1* 30.1* 29.0*  MCV 83.6  --  86.9  --   --  85.5 83.8 84.1  PLT 248  --  291  --   --  197 203 186   < > = values in this interval not displayed.    Basic Metabolic Panel: Recent Labs  Lab 08/18/18 0526  08/18/18 0856 08/18/18 1613 08/19/18 0209  08/19/18 1803  08/20/18 0348 08/20/18 0745 08/20/18 1455 08/21/18 0306 08/22/18 0354 08/23/18 0430  NA 139   < >  --  141 141   < >  --    < > 143 144 141 141 141 144  K 3.2*   < >  --  5.5* 4.1   < >  --    < > 4.4 4.3 5.9* 4.1 3.3* 3.1*  CL 109  --   --  112* 115*  --   --   --  114*  --   --  113* 110 111  CO2 20*  --   --  18* 18*  --   --   --  18*  --   --  20* 18* 20*  GLUCOSE 133*  --   --  180* 249*  --   --   --  128*  --   --  75 251* 208*  BUN 11  --   --  10 10  --   --   --  15  --   --  22 25* 28*  CREATININE 0.68  --   --  1.01 0.79  --   --   --  1.22  --   --  1.13 1.26* 0.95  CALCIUM 8.0*  --   --  8.1* 7.2*  --   --   --  8.2*  --   --  8.0* 8.4* 8.7*  MG 1.8  --  1.8 2.3 2.0  --  2.1  --   --   --   --   --   --   --   PHOS 1.7*  --  1.8* 3.7 1.7*  --  4.2  --   --   --   --   --   --   --    < > = values in this interval not displayed.   GFR: Estimated Creatinine Clearance: 69.4 mL/min (by C-G formula based on SCr of 0.95 mg/dL). Recent Labs  Lab 08/17/18 1616  08/20/18 0348 08/20/18 0802 08/20/18 0948 08/20/18 1045 08/21/18 0306  08/22/18 0354 08/23/18 0430  PROCALCITON  --   --   --   --  12.97  --  8.95 5.34  --   WBC  --    < > 19.6*  --   --   --  10.3 9.4 8.0  LATICACIDVEN 6.7*  --   --  1.4  --  1.4  --   --   --    < > = values in this interval not displayed.    Liver Function Tests: Recent Labs  Lab 08/17/18 1532  AST 336*  ALT 257*  ALKPHOS 72  BILITOT 0.6  PROT 5.8*  ALBUMIN 3.2*    ABG    Component Value Date/Time   PHART 7.293 (L) 08/20/2018 1455   PCO2ART 36.0 08/20/2018 1455   PO2ART 91.0 08/20/2018 1455   HCO3 17.4 (L) 08/20/2018 1455   TCO2 19 (L) 08/20/2018 1455   ACIDBASEDEF 8.0 (H) 08/20/2018 1455   O2SAT 49.0 08/23/2018 0450    CBG: Recent Labs  Lab 08/22/18 1649 08/22/18 1927 08/22/18 2329 08/23/18 0309 08/23/18 0715  GLUCAP 143* 235* 224* 202* 181*    The patient is critically ill with multiple organ systems failure and requires high complexity decision making for assessment and support, frequent evaluation and titration of therapies, application of advanced monitoring technologies and extensive interpretation of multiple databases.   Critical Care Time devoted to patient care services described in this note is 40 Minutes. This time reflects time of care of this signee Dr. Rodman Pickle. This critical care time does not reflect procedure time, or teaching time or supervisory time of PA/NP/Med student/Med Resident etc but could involve care discussion time.  Rodman Pickle, M.D. [facility] Pulmonary/Critical Care Medicine Pager: [phone] After hours pager: [phone]

## 2018-08-23 NOTE — Progress Notes (Signed)
[facility] ADULT ICU REPLACEMENT PROTOCOL FOR AM LAB REPLACEMENT ONLY  The patient does apply for the [facility] Adult ICU Electrolyte Replacment Protocol based on the criteria listed below:   1. Is GFR >/= 40 ml/min? Yes.    Patient's GFR today is >60 2. Is urine output >/= 0.5 ml/kg/hr for the last 6 hours? Yes.   Patient's UOP is 0.62 ml/kg/hr 3. Is BUN < 60 mg/dL? Yes.    Patient's BUN today is 28 4. Abnormal electrolyte  K 3.1 5. Ordered repletion with: per protocol 6. If a panic level lab has been reported, has the CCM MD in charge been notified? Yes.  .   Physician:  Tenny Craw 08/23/2018 5:49 AM

## 2018-08-23 NOTE — Evaluation (Signed)
Clinical/Bedside Swallow Evaluation Patient Details  Name: [name] [name] MRN: [id number] Date of Birth: 05-29-1940  Today's Date: 08/23/2018 Time: SLP Start Time (ACUTE ONLY): 3557 SLP Stop Time (ACUTE ONLY): 1632 SLP Time Calculation (min) (ACUTE ONLY): 23 min  Past Medical History: No past medical history on file. Past Surgical History:  The histories are not reviewed yet. Please review them in the "History" navigator section and refresh this [place]. HPI:  Pt is a 78 year old male who presented to the ED on 4/30 after a witnessed cardiac arrest. Immediate bystander CPR initiated. Pt found to be in V-fib on EMS arrival and was given 1 round of Epi and 2 shocks. ROSC achieved after ~11 minutes of CPR. Pt intubated in the field; extubated 5/6. CT Head negative for acute changes. PMH of T2DM, HH, GERD.   Assessment / Plan / Recommendation Clinical Impression  Pt is restless and impulsive, also needing cues to redirect attention throughout PO trials. He needs the most cueing when he is eating solids. His vocal quality sounds strong and clear despite prolonged intubation, although he has consistent, immediate coughing with thin liquids, suggestive of aspiration. Recommend Dys 2 diet and nectar thick liquids with full supervision given current mentation. Would also consider ordering SLP cognitive evaluation. SLP Visit Diagnosis: Dysphagia, unspecified (R13.10)    Aspiration Risk  Mild aspiration risk;Moderate aspiration risk    Diet Recommendation Dysphagia 2 (Fine chop);Nectar-thick liquid   Liquid Administration via: Cup;Straw Medication Administration: Whole meds with puree Supervision: Patient able to self feed;Full supervision/cueing for compensatory strategies Compensations: Minimize environmental distractions;Slow rate;Small sips/bites Postural Changes: Seated upright at 90 degrees;Remain upright for at least 30 minutes after po intake    Other  Recommendations Oral Care  Recommendations: Oral care BID Other Recommendations: Order thickener from pharmacy;Prohibited food (jello, ice cream, thin soups);Remove water pitcher   Follow up Recommendations (tba)      Frequency and Duration min 2x/week  2 weeks       Prognosis Prognosis for Safe Diet Advancement: Good Barriers to Reach Goals: Cognitive deficits      Swallow Study   General HPI: Pt is a 78 year old male who presented to the ED on 4/30 after a witnessed cardiac arrest. Immediate bystander CPR initiated. Pt found to be in V-fib on EMS arrival and was given 1 round of Epi and 2 shocks. ROSC achieved after ~11 minutes of CPR. Pt intubated in the field; extubated 5/6. CT Head negative for acute changes. PMH of T2DM, HH, GERD. Type of Study: Bedside Swallow Evaluation Previous Swallow Assessment: none in chart Diet Prior to this Study: NPO Temperature Spikes Noted: Yes(101.1) Respiratory Status: Nasal cannula History of Recent Intubation: Yes Length of Intubations (days): 7 days Date extubated: 08/23/18 Behavior/Cognition: Alert;Cooperative;Confused;Impulsive;Distractible;Requires cueing Oral Cavity Assessment: Within Functional Limits Oral Care Completed by SLP: No Oral Cavity - Dentition: Missing dentition;Dentures, not available Vision: Functional for self-feeding Self-Feeding Abilities: Needs assist Patient Positioning: Upright in bed Baseline Vocal Quality: Normal Volitional Swallow: Able to elicit    Oral/Motor/Sensory Function Overall Oral Motor/Sensory Function: Within functional limits   Ice Chips Ice chips: Within functional limits Presentation: Spoon   Thin Liquid Thin Liquid: Impaired Presentation: Cup;Self Fed Pharyngeal  Phase Impairments: Cough - Immediate    Nectar Thick Nectar Thick Liquid: Within functional limits Presentation: Cup;Self Fed;Straw   Honey Thick Honey Thick Liquid: Not tested   Puree Puree: Within functional limits Presentation: Self Fed;Spoon    Solid     Solid: Impaired Presentation: Self Fed  Oral Phase Impairments: Impaired mastication Pharyngeal Phase Impairments: Cough - Delayed      [name] [name] 08/23/2018,5:09 PM   Pollyann Glen, M.A. [place] Acute Environmental education officer [phone] Office [phone]

## 2018-08-23 NOTE — Progress Notes (Signed)
[facility] Progress Note Patient Name: [name] [name] DOB: 06-Apr-1941 MRN: [id number]   Date of Service  08/23/2018  HPI/Events of Note  K+ 3.0, Needs something for sleep  eICU Interventions  Elink adult electrolyte replacement protocol for K+, Melatonin 3 mg hs prn insomnia        Frederik Pear 08/23/2018, 8:53 PM

## 2018-08-24 LAB — GLUCOSE, CAPILLARY
Glucose-Capillary: 208 mg/dL — ABNORMAL HIGH (ref 70–99)
Glucose-Capillary: 177 mg/dL — ABNORMAL HIGH (ref 70–99)
Glucose-Capillary: 163 mg/dL — ABNORMAL HIGH (ref 70–99)
Glucose-Capillary: 253 mg/dL — ABNORMAL HIGH (ref 70–99)
Glucose-Capillary: 230 mg/dL — ABNORMAL HIGH (ref 70–99)
Glucose-Capillary: 252 mg/dL — ABNORMAL HIGH (ref 70–99)
Glucose-Capillary: 322 mg/dL — ABNORMAL HIGH (ref 70–99)

## 2018-08-24 LAB — CBC
HCT: 31.7 % — ABNORMAL LOW (ref 39.0–52.0)
Hemoglobin: 10.1 g/dL — ABNORMAL LOW (ref 13.0–17.0)
MCH: 26.6 pg (ref 26.0–34.0)
MCHC: 31.9 g/dL (ref 30.0–36.0)
MCV: 83.4 fL (ref 80.0–100.0)
Platelets: 232 10*3/uL (ref 150–400)
RBC: 3.8 MIL/uL — ABNORMAL LOW (ref 4.22–5.81)
RDW: 14.8 % (ref 11.5–15.5)
WBC: 8.6 10*3/uL (ref 4.0–10.5)
nRBC: 0 % (ref 0.0–0.2)

## 2018-08-24 LAB — HEPARIN LEVEL (UNFRACTIONATED)
Heparin Unfractionated: 0.17 IU/mL — ABNORMAL LOW (ref 0.30–0.70)
Heparin Unfractionated: 0.4 IU/mL (ref 0.30–0.70)
Heparin Unfractionated: 0.54 IU/mL (ref 0.30–0.70)

## 2018-08-24 LAB — BASIC METABOLIC PANEL
Anion gap: 9 (ref 5–15)
BUN: 22 mg/dL (ref 8–23)
CO2: 23 mmol/L (ref 22–32)
Calcium: 8.6 mg/dL — ABNORMAL LOW (ref 8.9–10.3)
Chloride: 114 mmol/L — ABNORMAL HIGH (ref 98–111)
Creatinine, Ser: 0.9 mg/dL (ref 0.61–1.24)
GFR calc Af Amer: >60 mL/min (ref >60)
GFR calc non Af Amer: >60 mL/min (ref >60)
Glucose, Bld: 180 mg/dL — ABNORMAL HIGH (ref 70–99)
Potassium: 3 mmol/L — ABNORMAL LOW (ref 3.5–5.1)
Sodium: 146 mmol/L — ABNORMAL HIGH (ref 135–145)

## 2018-08-24 LAB — COOXEMETRY PANEL
Carboxyhemoglobin: 1.2 % (ref 0.5–1.5)
Methemoglobin: 1.4 % (ref 0.0–1.5)
O2 Saturation: 56.4 %
Total hemoglobin: 10.5 g/dL — ABNORMAL LOW (ref 12.0–16.0)

## 2018-08-24 MED ORDER — INSULIN ASPART 100 UNIT/ML IV SOLN
5.0000 [IU] | Freq: Once | INTRAVENOUS | Status: AC
  Administered 2018-08-24: 22:00:00 5 [IU] via INTRAVENOUS

## 2018-08-24 MED ORDER — SODIUM CHLORIDE 0.9 % IV SOLN: 250.0000 mL | INTRAVENOUS | Status: DC | PRN

## 2018-08-24 MED ORDER — HEPARIN (PORCINE) 25000 UT/250ML-% IV SOLN
1950.0000 [IU]/h | INTRAVENOUS | Status: DC
  Administered 2018-08-24 – 2018-08-25 (×2): 1950 [IU]/h via INTRAVENOUS
  Filled 2018-08-24: qty 250

## 2018-08-24 MED ORDER — FLUTICASONE PROPIONATE 50 MCG/ACT NA SUSP
2.0000 | Freq: Every day | NASAL | Status: DC | PRN
  Filled 2018-08-24: qty 16

## 2018-08-24 MED ORDER — LEVOTHYROXINE SODIUM 75 MCG PO TABS
75.0000 ug | ORAL_TABLET | Freq: Every day | ORAL | Status: DC
  Administered 2018-08-24 – 2018-08-30 (×6): 75 ug via ORAL
  Filled 2018-08-24 (×7): qty 1

## 2018-08-24 MED ORDER — AMIODARONE HCL 200 MG PO TABS
400.0000 mg | ORAL_TABLET | Freq: Two times a day (BID) | ORAL | Status: DC
  Administered 2018-08-24 – 2018-08-30 (×13): 400 mg via ORAL
  Filled 2018-08-24 (×13): qty 2

## 2018-08-24 MED ORDER — SODIUM CHLORIDE 0.9% FLUSH: 3.0000 mL | INTRAVENOUS | Status: DC | PRN

## 2018-08-24 MED ORDER — POTASSIUM CHLORIDE 20 MEQ/15ML (10%) PO SOLN
30.0000 meq | ORAL | Status: AC
  Administered 2018-08-24 (×2): 30 meq
  Filled 2018-08-24 (×2): qty 30

## 2018-08-24 MED ORDER — METOPROLOL TARTRATE 25 MG PO TABS: 25.0000 mg | ORAL_TABLET | Freq: Two times a day (BID) | ORAL | Status: DC

## 2018-08-24 MED ORDER — SODIUM CHLORIDE 0.9 % WEIGHT BASED INFUSION
1.0000 mL/kg/h | INTRAVENOUS | Status: DC
  Administered 2018-08-25: 05:00:00 1 mL/kg/h via INTRAVENOUS

## 2018-08-24 MED ORDER — SODIUM CHLORIDE 0.9 % WEIGHT BASED INFUSION
3.0000 mL/kg/h | INTRAVENOUS | Status: AC
  Administered 2018-08-25: 04:00:00 3 mL/kg/h via INTRAVENOUS

## 2018-08-24 MED ORDER — METOPROLOL TARTRATE 25 MG PO TABS
25.0000 mg | ORAL_TABLET | Freq: Three times a day (TID) | ORAL | Status: DC
  Administered 2018-08-24 – 2018-08-26 (×6): 25 mg via ORAL
  Filled 2018-08-24 (×6): qty 1

## 2018-08-24 MED ORDER — ASPIRIN 81 MG PO CHEW
81.0000 mg | CHEWABLE_TABLET | ORAL | Status: AC
  Administered 2018-08-25: 06:00:00 81 mg via ORAL
  Filled 2018-08-24: qty 1

## 2018-08-24 MED ORDER — SODIUM CHLORIDE 0.9% FLUSH
3.0000 mL | Freq: Two times a day (BID) | INTRAVENOUS | Status: DC
  Administered 2018-08-24 – 2018-08-25 (×3): 3 mL via INTRAVENOUS

## 2018-08-24 MED ORDER — AMIODARONE HCL 200 MG PO TABS: 200.0000 mg | ORAL_TABLET | Freq: Every day | ORAL | Status: DC

## 2018-08-24 NOTE — Progress Notes (Signed)
[facility] ADULT ICU REPLACEMENT PROTOCOL FOR AM LAB REPLACEMENT ONLY  The patient does apply for the [facility] Adult ICU Electrolyte Replacment Protocol based on the criteria listed below:   1. Is GFR >/= 40 ml/min? Yes.    Patient's GFR today is >60 2. Is urine output >/= 0.5 ml/kg/hr for the last 6 hours? Yes.   Patient's UOP is 0.73 ml/kg/hr 3. Is BUN < 60 mg/dL? Yes.    Patient's BUN today is 22 4. Abnormal electrolyte K 3.0 5. Ordered repletion with:per protocol 6. If a panic level lab has been reported, has the CCM MD in charge been notified? Yes.  .   Physician:  Tenny Craw 08/24/2018 5:59 AM

## 2018-08-24 NOTE — Progress Notes (Addendum)
Potassium 3.0 and patient requesting something for sleep. Paged Dr. Lucile Shutters. See new orders.

## 2018-08-24 NOTE — Progress Notes (Addendum)
Progress Note  Patient Name: [name] [name] Date of Encounter: 08/24/2018  Primary Cardiologist: No primary care provider on file.   Subjective   Extubated. Sitting in chair. NAD. CVP 5-6 last night. Co-ox 56%.   Denies CP or SOB. Affect a bit odd but fully oriented.  Was in AF yesterday but now back in NSR  Inpatient Medications    Scheduled Meds: . amiodarone  400 mg Oral BID   Followed by  . [START ON 08/31/2018] amiodarone  200 mg Oral Daily  . amLODipine  10 mg Oral Daily  . aspirin  81 mg Per Tube Daily  . atorvastatin  80 mg Per Tube q1800  . Chlorhexidine Gluconate Cloth  6 each Topical Daily  . insulin aspart  2-6 Units Subcutaneous Q4H  . insulin detemir  10 Units Subcutaneous Q12H  . mouth rinse  15 mL Mouth Rinse BID  . midazolam  2 mg Intravenous Once   Continuous Infusions: . sodium chloride Stopped (08/18/18 1328)  . sodium chloride 10 mL/hr at 08/24/18 1100  . ceFEPime (MAXIPIME) IV Stopped (08/24/18 1046)  . heparin 1,950 Units/hr (08/24/18 1100)   PRN Meds: Place/Maintain arterial line **AND** sodium chloride, acetaminophen (TYLENOL) oral liquid 160 mg/5 mL, hydrALAZINE, ipratropium-albuterol, Melatonin, metoprolol tartrate, Resource ThickenUp Clear, sodium chloride flush   Vital Signs    Vitals:   08/24/18 1000 08/24/18 1001 08/24/18 1100 08/24/18 1114  BP: (!) 144/77     Pulse: (!) 102 76    Resp: (!) 22 19 19    Temp:    99 F (37.2 C)  TempSrc:    Oral  SpO2: 97% 95%    Weight:      Height:        Intake/Output Summary (Last 24 hours) at 08/24/2018 1307 Last data filed at 08/24/2018 1200 Gross per 24 hour  Intake 1733.71 ml  Output 1425 ml  Net 308.71 ml   Last 3 Weights 08/24/2018 08/23/2018 08/22/2018  Weight (lbs) 169 lb 12.1 oz 174 lb 6.1 oz 173 lb 4.5 oz  Weight (kg) 77 kg 79.1 kg 78.6 kg      Telemetry   NSR 70s Personally reviewed   Physical Exam  General:  Sitting in chair . No resp difficulty HEENT: normal small hematoma  on forehead Neck: supple. no JVD. Carotids 2+ bilat; no bruits. No lymphadenopathy or thryomegaly appreciated. Cor: PMI nondisplaced. Regular rate & rhythm. No rubs, gallops or murmurs. Lungs: clear Abdomen: soft, nontender, nondistended. No hepatosplenomegaly. No bruits or masses. Good bowel sounds. Extremities: no cyanosis, clubbing, rash, edema Neuro: alert & orientedx3, cranial nerves grossly intact. moves all 4 extremities w/o difficulty. Affect ok   Labs    Chemistry Recent Labs  Lab 08/17/18 1532  08/23/18 0430 08/23/18 1240 08/24/18 0438  NA 141   < > 144 143 146*  K 3.4*   < > 3.1* 3.0* 3.0*  CL 106   < > 111 113* 114*  CO2 15*   < > 20* 20* 23  GLUCOSE 216*   < > 208* 163* 180*  BUN 9   < > 28* 29* 22  CREATININE 1.32*   < > 0.95 1.03 0.90  CALCIUM 8.7*   < > 8.7* 8.6* 8.6*  PROT 5.8*  --   --   --   --   ALBUMIN 3.2*  --   --   --   --   AST 336*  --   --   --   --  ALT 257*  --   --   --   --   ALKPHOS 72  --   --   --   --   BILITOT 0.6  --   --   --   --   GFRNONAA 52*   < > >60 >60 >60  GFRAA 60*   < > >60 >60 >60  ANIONGAP 20*   < > 13 10 9    < > = values in this interval not displayed.     Hematology Recent Labs  Lab 08/22/18 0354 08/23/18 0430 08/24/18 0438  WBC 9.4 8.0 8.6  RBC 3.59* 3.45* 3.80*  HGB 9.5* 9.2* 10.1*  HCT 30.1* 29.0* 31.7*  MCV 83.8 84.1 83.4  MCH 26.5 26.7 26.6  MCHC 31.6 31.7 31.9  RDW 14.8 15.0 14.8  PLT 203 186 232    Cardiac Enzymes Recent Labs  Lab 08/18/18 0017 08/18/18 0526 08/18/18 1224 08/19/18 0824  TROPONINI 2.07* 2.56* 2.77* 2.03*   No results for input(s): TROPIPOC in the last 168 hours.   BNPNo results for input(s): BNP, PROBNP in the last 168 hours.   DDimer No results for input(s): DDIMER in the last 168 hours.   Radiology    No results found.  Patient Profile     78 y.o. male with type 2 diabetes, hypertension, and mixed hyperlipidemia, admitted 08/17/2018 with out of hospital ventricular  fibrillation arrest  Assessment & Plan    1. Out of hospital ventricular fibrillation arrest:  - Although this occurred in setting of choking episode. Initial rhythm was VF and troponin elevated. He will need cardiac cath - arrange for tomorrow - co-ox 56% CVP 4-5  2.Elevated troponin suspect demand ischemia:  - cath in am  - continue ASA, Statin - EF 55-60% by echo   3. Atrial fibrillation with RVR:   - Now back in sinus rhythm on po amiodarone.   - Continue heparin for anticoagulation.  Once he undergoes cardiac catheterization, will transition to a direct oral anticoagulant drug. -This patients CHA2DS2-VASc Score and unadjusted Ischemic Stroke Rate (% per year) is equal to 7.2 % stroke rate/year from a score of 5   4. Acute hypoxemic respiratory failure: Per CCM team.  Extubated 5/6  5.  Acute diastolic heart failure:  - he appears dry. Hold diuretics today  6. Hypokalemia.  - supp   7. HTN - BP controlled on amlodipine but was on quinapril at home. With DM2 would restart ACE after cath   7. DM2  - per primary team.     For questions or updates, please contact [facility] Please consult [email or web address] for contact info under      Signed, Glori Bickers, MD  08/24/2018, 1:07 PM

## 2018-08-24 NOTE — Progress Notes (Signed)
ANTICOAGULATION CONSULT NOTE - Follow-Up Consult  Pharmacy Consult for Heparin Indication: atrial fibrillation  Not on File  Patient Measurements: Height: 5\' 11"  (180.3 cm) Weight: 169 lb 12.1 oz (77 kg) IBW/kg (Calculated) : 75.3  Vital Signs: Temp: 98.4 F (36.9 C) (05/07 1942) Temp Source: Axillary (05/07 1942) BP: 142/76 (05/07 1942) Pulse Rate: 97 (05/07 1942)  Labs: Recent Labs    08/22/18 0354  08/23/18 0430 08/23/18 1240  08/24/18 0438 08/24/18 1300 08/24/18 1938  HGB 9.5*  --  9.2*  --   --  10.1*  --   --   HCT 30.1*  --  29.0*  --   --  31.7*  --   --   PLT 203  --  186  --   --  232  --   --   HEPARINUNFRC  --    < > 0.29* 0.34   < > 0.17* 0.40 0.54  CREATININE 1.26*  --  0.95 1.03  --  0.90  --   --    < > = values in this interval not displayed.    Estimated Creatinine Clearance: 73.2 mL/min (by C-G formula based on SCr of 0.9 mg/dL).  Assessment: [name] admitted 08/17/18 with out of hospital VFib arrest. Pharmacy now consulted to dose heparin for Afib. Pt not on AC PTA.   Heparin remains therapeutic on recheck this evening.  Goal of Therapy:  Heparin level 0.3-0.7 units/ml Monitor platelets by anticoagulation protocol: Yes   Plan:  Continue heparin 1950 units/hr Recheck with daily labs   Arrie Senate, PharmD, BCPS Clinical Pharmacist [phone] Please check AMION for all [place] numbers 08/24/2018

## 2018-08-24 NOTE — Progress Notes (Addendum)
NAME:  [name] [name], MRN:  [id number], DOB:  [birth year], LOS: 7 ADMISSION DATE:  08/17/2018, CONSULTATION DATE:  08/17/2018 REFERRING MD: ED provider, CHIEF COMPLAINT: Post cardiac arrest  Brief History   78 year old male with DM who had a witnessed collapse. Bystander reported complaints about fatigue and concern about his sugar. CPR initiated immediately. ACLS crew arrived within 5 minutes. Total of 11 minutes prior to ROSC. Required 2 shocks for V. Fib Comatose on presentation to the emergency department.  Underwent TTM Followed commands initially following re-warming but subsequent delirium. Mental status improved and patient extubated on 5/6  S/B Cardiology who recommend catheterization on this admission.  Past Medical History  Well controlled T2M  Significant Diagnostic Tests:  CT head -  No acute intracranial pathology.  EKG did not reveal any ST T wave changes TTE 5/3 - EF 55-60%. Impaired relaxation.  Micro Data:  4/30 [place] - neg 5/3 [place], pending 5/3 Trach asp - Hafnia Alvei. Sensitive to Cefepime, Cipro and Bactrim  Interim history/subjective:  Tolerated extubation.  Patient believes that he choked on a doughnut leading to arrest.  Objective   Blood pressure (!) 148/85, pulse (!) 134, temperature 98.9 F (37.2 C), temperature source Oral, resp. rate 20, height 5\' 11"  (1.803 m), weight 77 kg, SpO2 95 %. CVP:  [4 mmHg-5 mmHg] 5 mmHg  FiO2 (%):  [40 %] 40 %   Intake/Output Summary (Last 24 hours) at 08/24/2018 0755 Last data filed at 08/24/2018 0700 Gross per 24 hour  Intake 1629.23 ml  Output 2160 ml  Net -530.77 ml    Filed Weights   08/22/18 0500 08/23/18 0500 08/24/18 0500  Weight: 78.6 kg 79.1 kg 77 kg   Physical Exam: General: Elderly-appearing, no acute distress HENT: No pressure ulceration from prior support devices.  Forehead laceration. Eyes: EOMI, no scleral icterus Respiratory: Rhonchi bilaterally. Cardiovascular: RRR, -M/R/G, no JVD GI:  BS+, soft, nontender Extremities:-Edema,-tenderness Neuro: Awake, conversant. Skin: Intact, no rashes or bruising GU: Foley in place  Resolved Hospital Problem list   Septic shock Respiratory failure Acute encephalopathy AKI  Assessment & Plan:   Status-post cardiac arrest V. Fib cardiac arrest Likely preceded by hypoglycemia however initial rhythm VF so will need to consider potential ischemic event. Peak troponin 2.77. S/p hypothermia protocol on 4/30. TTE with normal EF. - Cardiology consultation appreciated. Will plan on cath, but given history of choking may have none obstructive CAD. - ASA, statin  Hafnia Alvei pneumonia Sensitive to Cefepime, Cipro and Bactrim - F/u blood cultures - Continue Cefepime. Plan for 7 day course (end date 5/12)  pAFRVR - Transition to oral amiodarone. - Continue Heparin gtt  Hypertension - Amlodipine - PRN hydralazine  Hypokalemia - Continue to replete - BMP at 1400  DMII, hyperglycemia  - CBGs q4H with SSI  - Off Insulin gtt  Nutritional Support - Swallow evaluation.  Best practice:  Diet: TF, N.p.o. Pain/Anxiety/Delirium protocol (if indicated): none VAP protocol (if indicated): n/a DVT prophylaxis: Heparin GI prophylaxis: Protonix Glucose control: SSI Mobility: Bedrest Code Status: Full code Family Communication: Updated son on 5/6. He is happy to hear his dad may potentially be extubated. He does wish to keep him full code. He will update his mother/wife of patient. Disposition: transfer to PCU. Order reconciliation complete. TRH contacted.   Labs   CBC: Recent Labs  Lab 08/20/18 0348  08/20/18 1455 08/21/18 0306 08/22/18 0354 08/23/18 0430 08/24/18 0438  WBC 19.6*  --   --  10.3 9.4 8.0 8.6  HGB 12.6*   < > 11.2* 10.5* 9.5* 9.2* 10.1*  HCT 40.3   < > 33.0* 33.1* 30.1* 29.0* 31.7*  MCV 86.9  --   --  85.5 83.8 84.1 83.4  PLT 291  --   --  197 203 186 232   < > = values in this interval not displayed.     Basic Metabolic Panel: Recent Labs  Lab 08/18/18 0526  08/18/18 0856 08/18/18 1613 08/19/18 0209  08/19/18 1803  08/21/18 0306 08/22/18 0354 08/23/18 0430 08/23/18 1240 08/23/18 2119 08/24/18 0438  NA 139   < >  --  141 141   < >  --    < > 141 141 144 143  --  146*  K 3.2*   < >  --  5.5* 4.1   < >  --    < > 4.1 3.3* 3.1* 3.0*  --  3.0*  CL 109  --   --  112* 115*  --   --    < > 113* 110 111 113*  --  114*  CO2 20*  --   --  18* 18*  --   --    < > 20* 18* 20* 20*  --  23  GLUCOSE 133*  --   --  180* 249*  --   --    < > 75 251* 208* 163*  --  180*  BUN 11  --   --  10 10  --   --    < > 22 25* 28* 29*  --  22  CREATININE 0.68  --   --  1.01 0.79  --   --    < > 1.13 1.26* 0.95 1.03  --  0.90  CALCIUM 8.0*  --   --  8.1* 7.2*  --   --    < > 8.0* 8.4* 8.7* 8.6*  --  8.6*  MG 1.8  --  1.8 2.3 2.0  --  2.1  --   --   --   --   --  1.9  --   PHOS 1.7*  --  1.8* 3.7 1.7*  --  4.2  --   --   --   --   --   --   --    < > = values in this interval not displayed.   GFR: Estimated Creatinine Clearance: 73.2 mL/min (by C-G formula based on SCr of 0.9 mg/dL). Recent Labs  Lab 08/17/18 1616  08/20/18 0802 08/20/18 0948 08/20/18 1045 08/21/18 0306 08/22/18 0354 08/23/18 0430 08/24/18 0438  PROCALCITON  --   --   --  12.97  --  8.95 5.34  --   --   WBC  --    < >  --   --   --  10.3 9.4 8.0 8.6  LATICACIDVEN 6.7*  --  1.4  --  1.4  --   --   --   --    < > = values in this interval not displayed.    Liver Function Tests: Recent Labs  Lab 08/17/18 1532  AST 336*  ALT 257*  ALKPHOS 72  BILITOT 0.6  PROT 5.8*  ALBUMIN 3.2*    ABG    Component Value Date/Time   PHART 7.293 (L) 08/20/2018 1455   PCO2ART 36.0 08/20/2018 1455   PO2ART 91.0 08/20/2018 1455   HCO3 17.4 (L) 08/20/2018 1455   TCO2  19 (L) 08/20/2018 1455   ACIDBASEDEF 8.0 (H) 08/20/2018 1455   O2SAT 56.4 08/24/2018 0445    CBG: Recent Labs  Lab 08/23/18 1133 08/23/18 1522 08/23/18 1931 08/23/18  2316 08/24/18 0732  GLUCAP 148* 173* 200* 208* 163*    40 min spent including >40% in counseling and coordination of care.  Kipp Brood, MD [facility] ICU Physician [place]  Pager: [phone] Mobile: [phone] After hours: [phone].

## 2018-08-24 NOTE — Progress Notes (Addendum)
ANTICOAGULATION CONSULT NOTE - Follow-Up Consult  Pharmacy Consult for Heparin Indication: atrial fibrillation  Not on File  Patient Measurements: Height: 5\' 11"  (180.3 cm) Weight: 169 lb 12.1 oz (77 kg) IBW/kg (Calculated) : 75.3  Vital Signs: Temp: 97.7 F (36.5 C) (05/07 0342) Temp Source: Oral (05/07 0342) BP: 152/82 (05/07 0600) Pulse Rate: 86 (05/07 0600)  Labs: Recent Labs    08/22/18 0354  08/23/18 0430 08/23/18 1240 08/23/18 2043 08/24/18 0438  HGB 9.5*  --  9.2*  --   --  10.1*  HCT 30.1*  --  29.0*  --   --  31.7*  PLT 203  --  186  --   --  232  HEPARINUNFRC  --    < > 0.29* 0.34 0.23* 0.17*  CREATININE 1.26*  --  0.95 1.03  --  0.90   < > = values in this interval not displayed.    Estimated Creatinine Clearance: 73.2 mL/min (by C-G formula based on SCr of 0.9 mg/dL).  Assessment: [name] admitted 08/17/18 with out of hospital VFib arrest. Pharmacy now consulted to dose heparin for Afib. Pt not on AC PTA.   Heparin remains subtherapeutic at 0.17 on 1650 units/hr, confirmed no issues with infusion/lines per RN. Hgb 10.1, plts ok, SCR stable.   Goal of Therapy:  Heparin level 0.3-0.7 units/ml Monitor platelets by anticoagulation protocol: Yes   Plan:  Increase heparin to heparin infusion to 1950 units/hr  Heparin level in 6 hours Monitor daily heparin levels, CBC, s/sx bleeding   Juanell Fairly, PharmD PGY1 Pharmacy Resident 08/24/2018 6:43 AM Please check AMION for all [place] numbers   Addendum 6 hour heparin therapeutic at 0.4 on 1950 units/hr.   Plan Continue heparin at 1950 units/hr  Check confirmatory heparin level in 6 hours  Juanell Fairly, PharmD PGY1 Pharmacy Resident 08/24/2018 2:57 PM

## 2018-08-24 NOTE — Progress Notes (Signed)
Inpatient Diabetes Program Recommendations  AACE/ADA: New Consensus Statement on Inpatient Glycemic Control (2015)  Target Ranges:  Prepandial:   less than 140 mg/dL      Peak postprandial:   less than 180 mg/dL (1-2 hours)      Critically ill patients:  140 - 180 mg/dL   Results for CASHUS, HALTERMAN (MRN [id number]) as of 08/24/2018 07:05  Ref. Range 08/22/2018 23:29 08/23/2018 03:09 08/23/2018 07:15 08/23/2018 11:33 08/23/2018 15:22 08/23/2018 19:31  Glucose-Capillary Latest Ref Range: 70 - 99 mg/dL 224 (H) 202 (H) 181 (H) 148 (H) 173 (H) 200 (H)   Results for QUAME, SPRATLIN (MRN [id number]) as of 08/24/2018 09:52  Ref. Range 08/23/2018 23:16 08/24/2018 07:32  Glucose-Capillary Latest Ref Range: 70 - 99 mg/dL 208 (H)  6 units NOVOLOG  163 (H)  4 units NOVOLOG      Home DM Meds: Levemir 50 units Daily       Metformin 1000 mg BID  Current Orders: Levemir 10 units BID      Novolog 2-4-6 units Q4 hours (ICU Glycemic Control Protocol)      Note patient Extubated yesterday AM.  Tube feeds stopped and patient allowed Dysphagic PO diet.  Transferring out of ICU today.    MD- Please consider the following in-hospital insulin adjustments:  1. Stop the ICU Glycemic Control Protocol orders  2. Change Novolog SSi to Novolog Moderate Correction Scale/ SSI (0-15 units) Q4 hours per the regular Glycemic Control order set   3. Increase Levemir slightly to 12 units BID    --Will follow patient during hospitalization--  Wyn Quaker RN, MSN, CDE Diabetes Coordinator Inpatient Glycemic Control Team Team Pager: [phone] (8a-5p)

## 2018-08-24 NOTE — Progress Notes (Signed)
Nutrition Follow-up  RD working remotely.  DOCUMENTATION CODES:   Not applicable  INTERVENTION:  - Recommend bowel regimen as pt's last BM was on 4/30  - Vital Cuisine Shake TID with meals, each supplement provides 520 kcal and 22 grams of protein  - d/c TF orders  NUTRITION DIAGNOSIS:   Inadequate oral intake related to inability to eat as evidenced by NPO status.  Progressing, pt now on Dysphagia 2 diet with nectar-thick liquids  GOAL:   Patient will meet greater than or equal to 90% of their needs  Progressing  MONITOR:   PO intake, Supplement acceptance, Diet advancement, Labs, Weight trends, I & O's  REASON FOR ASSESSMENT:   Ventilator, Consult Enteral/tube feeding initiation and management  ASSESSMENT:   78 year old male who presented to the ED on 4/30 after a witnessed cardiac arrest. Immediate bystander CPR initiated. Pt found to be in V-fib on EMS arrival and was given 1 round of Epi and 2 shocks. ROSC achieved after ~11 minutes of CPR. Pt intubated in the field. Pt transferred to the ICU and TTM initiated. PMH of T2DM.  5/04 - post-pyloric Cortrak placed 5/06 - extubated, diet advanced to Dysphagia 2 with nectar-thick liquids  Weight down 6 lbs since admission.  Discussed pt with RN. Cortrak remains in place but is not being utilized for TF at this time. Per RN, no orders to remove Cortrak at this time.  If pt is able to demonstrate ability to meet his nutritional needs via PO intake alone, recommend removing Cortrak. RD to d/c TF orders at this time as pt's diet has been advanced and SLP is following.  Plan is for pt to transfer out of the ICU today.  Medications reviewed and include: SSI, Levemir 10 units BID, KCl 30 mEq x 2 runs, IV abx, heparin IVF: NS @ 10 ml/hr  Labs reviewed: sodium 146 (H), potassium 3.0 (L) - replacement orders in, chloride 114 (H) CBG's: 163, 208, 200, 173, 148 x 24 hours  UOP: 2160 ml x 24 hours I/O's: +2.9 L since  admit  Diet Order:   Diet Order            DIET DYS 2 Room service appropriate? Yes; Fluid consistency: Nectar Thick  Diet effective now              EDUCATION NEEDS:   Not appropriate for education at this time  Skin:  Skin Assessment: Reviewed RN Assessment  Last BM:  08/17/18  Height:   Ht Readings from Last 1 Encounters:  08/17/18 5\' 11"  (1.803 m)    Weight:   Wt Readings from Last 1 Encounters:  08/24/18 77 kg    Ideal Body Weight:  78.2 kg  BMI:  Body mass index is 23.68 kg/m.  Estimated Nutritional Needs:   Kcal:  1900-2100  Protein:  100-115 grams  Fluid:  >/= 1.9 L    Gaynell Face, MS, RD, LDN Inpatient Clinical Dietitian Pager: [phone] Weekend/After Hours: [phone]

## 2018-08-24 NOTE — Progress Notes (Addendum)
Pt HR 130s, BP 155/99. Cardiology Oval Linsey, MD notified. Verbal order received for metoprolol 25mg  tid and administer first dose now. Oval Linsey, MD stated to not given PRN hydralazine and administer metoprolol instead. Will continue to monitor HR and BP.

## 2018-08-24 NOTE — Progress Notes (Signed)
Potassium 3.0. Paged ELink. See new orders.

## 2018-08-24 NOTE — Progress Notes (Signed)
eLink Physician-Brief Progress Note Patient Name: [name] [name] DOB: 09/29/1940 MRN: [id number]   Date of Service  08/24/2018  HPI/Events of Note  Hyperglycemia despite SQ Novolog coverage insulin  eICU Interventions  Novolog 5 units iv x 1        Sharrieff Spratlin U Cruz Devilla 08/24/2018, 9:14 PM

## 2018-08-24 NOTE — Progress Notes (Signed)
  Speech Language Pathology Treatment: Dysphagia  Patient Details Name: [name] [name] MRN: [id number] DOB: Jul 11, 1940 Today's Date: 08/24/2018 Time: 5170-0174 SLP Time Calculation (min) (ACUTE ONLY): 16 min  Assessment / Plan / Recommendation Clinical Impression  Pt has no overt signs of aspiration with textures observed, but will not try chopped solids because they were not "on his test" despite reinforcement that he is on a chopped diet. He tells me that he won't eat anything unless it is soft, but when we discuss a fully pureed diet, he says, "don't do that!" He self-feeds at a rapid rate, also becoming distracted as he spills applesauce off his spoon. He swallowed meds whole in puree, administered by RN, without incidence. Recommend continuing current diet and precautions. Would consider SLP cognitive evaluation as well.    HPI HPI: Pt is a 78 year old male who presented to the ED on 4/30 after a witnessed cardiac arrest. Immediate bystander CPR initiated. Pt found to be in V-fib on EMS arrival and was given 1 round of Epi and 2 shocks. ROSC achieved after ~11 minutes of CPR. Pt intubated in the field; extubated 5/6. CT Head negative for acute changes. PMH of T2DM, HH, GERD.      SLP Plan  Continue with current plan of care       Recommendations  Diet recommendations: Dysphagia 2 (fine chop);Nectar-thick liquid Liquids provided via: Cup;Straw Medication Administration: Whole meds with puree Supervision: Staff to assist with self feeding;Full supervision/cueing for compensatory strategies Compensations: Minimize environmental distractions;Slow rate;Small sips/bites Postural Changes and/or Swallow Maneuvers: Seated upright 90 degrees                Oral Care Recommendations: Oral care BID Follow up Recommendations: (tba) SLP Visit Diagnosis: Dysphagia, unspecified (R13.10) Plan: Continue with current plan of care       Goldonna Qais Jowers 08/24/2018, 10:19  AM  Pollyann Glen, M.A. [place] Acute Environmental education officer [phone] Office [phone]

## 2018-08-25 ENCOUNTER — Inpatient Hospital Stay (HOSPITAL_COMMUNITY)
Admission: EM | Disposition: A | Discharge status: Rehabilitation Facility | Payer: Self-pay | Source: Home / Self Care | Attending: Pulmonary Disease

## 2018-08-25 ENCOUNTER — Inpatient Hospital Stay (HOSPITAL_COMMUNITY): Payer: Medicare PPO

## 2018-08-25 DIAGNOSIS — I214 Non-ST elevation (NSTEMI) myocardial infarction: Secondary | ICD-10-CM

## 2018-08-25 DIAGNOSIS — I2510 Atherosclerotic heart disease of native coronary artery without angina pectoris: Secondary | ICD-10-CM

## 2018-08-25 HISTORY — PX: LEFT HEART CATH AND CORONARY ANGIOGRAPHY: CATH118249

## 2018-08-25 HISTORY — PX: CORONARY STENT INTERVENTION: CATH118234

## 2018-08-25 LAB — POCT ACTIVATED CLOTTING TIME
Activated Clotting Time: 246 seconds
Activated Clotting Time: 274 seconds

## 2018-08-25 LAB — CULTURE, BLOOD (ROUTINE X 2)
Culture: NO GROWTH
Culture: NO GROWTH

## 2018-08-25 LAB — BASIC METABOLIC PANEL
Anion gap: 8 (ref 5–15)
Anion gap: 10 (ref 5–15)
BUN: 17 mg/dL (ref 8–23)
BUN: 17 mg/dL (ref 8–23)
CO2: 25 mmol/L (ref 22–32)
CO2: 22 mmol/L (ref 22–32)
Calcium: 8.4 mg/dL — ABNORMAL LOW (ref 8.9–10.3)
Calcium: 8.5 mg/dL — ABNORMAL LOW (ref 8.9–10.3)
Chloride: 113 mmol/L — ABNORMAL HIGH (ref 98–111)
Chloride: 116 mmol/L — ABNORMAL HIGH (ref 98–111)
Creatinine, Ser: 0.81 mg/dL (ref 0.61–1.24)
Creatinine, Ser: 0.87 mg/dL (ref 0.61–1.24)
GFR calc Af Amer: >60 mL/min (ref >60)
GFR calc Af Amer: >60 mL/min (ref >60)
GFR calc non Af Amer: >60 mL/min (ref >60)
GFR calc non Af Amer: >60 mL/min (ref >60)
Glucose, Bld: 212 mg/dL — ABNORMAL HIGH (ref 70–99)
Glucose, Bld: 184 mg/dL — ABNORMAL HIGH (ref 70–99)
Potassium: 2.8 mmol/L — ABNORMAL LOW (ref 3.5–5.1)
Potassium: 3.3 mmol/L — ABNORMAL LOW (ref 3.5–5.1)
Sodium: 146 mmol/L — ABNORMAL HIGH (ref 135–145)
Sodium: 148 mmol/L — ABNORMAL HIGH (ref 135–145)

## 2018-08-25 LAB — CBC
HCT: 31.5 % — ABNORMAL LOW (ref 39.0–52.0)
Hemoglobin: 10.1 g/dL — ABNORMAL LOW (ref 13.0–17.0)
MCH: 26.7 pg (ref 26.0–34.0)
MCHC: 32.1 g/dL (ref 30.0–36.0)
MCV: 83.3 fL (ref 80.0–100.0)
Platelets: 226 10*3/uL (ref 150–400)
RBC: 3.78 MIL/uL — ABNORMAL LOW (ref 4.22–5.81)
RDW: 15.1 % (ref 11.5–15.5)
WBC: 8.7 10*3/uL (ref 4.0–10.5)
nRBC: 0.2 % (ref 0.0–0.2)

## 2018-08-25 LAB — GLUCOSE, CAPILLARY
Glucose-Capillary: 167 mg/dL — ABNORMAL HIGH (ref 70–99)
Glucose-Capillary: 188 mg/dL — ABNORMAL HIGH (ref 70–99)
Glucose-Capillary: 202 mg/dL — ABNORMAL HIGH (ref 70–99)
Glucose-Capillary: 221 mg/dL — ABNORMAL HIGH (ref 70–99)
Glucose-Capillary: 266 mg/dL — ABNORMAL HIGH (ref 70–99)

## 2018-08-25 LAB — HEPARIN LEVEL (UNFRACTIONATED): Heparin Unfractionated: 0.61 IU/mL (ref 0.30–0.70)

## 2018-08-25 LAB — MAGNESIUM: Magnesium: 1.8 mg/dL (ref 1.7–2.4)

## 2018-08-25 SURGERY — LEFT HEART CATH AND CORONARY ANGIOGRAPHY
Anesthesia: LOCAL

## 2018-08-25 MED ORDER — ONDANSETRON HCL 4 MG/2ML IJ SOLN: 4.0000 mg | Freq: Four times a day (QID) | INTRAMUSCULAR | Status: DC | PRN

## 2018-08-25 MED ORDER — APIXABAN 5 MG PO TABS
5.0000 mg | ORAL_TABLET | Freq: Two times a day (BID) | ORAL | Status: DC
  Administered 2018-08-26 – 2018-08-30 (×9): 5 mg via ORAL
  Filled 2018-08-25 (×9): qty 1

## 2018-08-25 MED ORDER — CANGRELOR BOLUS VIA INFUSION
INTRAVENOUS | Status: DC | PRN
  Administered 2018-08-25: 2310 ug via INTRAVENOUS

## 2018-08-25 MED ORDER — POTASSIUM CHLORIDE CRYS ER 20 MEQ PO TBCR
40.0000 meq | EXTENDED_RELEASE_TABLET | Freq: Once | ORAL | Status: AC
  Administered 2018-08-25: 40 meq via ORAL
  Filled 2018-08-25: qty 2

## 2018-08-25 MED ORDER — NITROGLYCERIN 1 MG/10 ML FOR IR/CATH LAB
INTRA_ARTERIAL | Status: DC | PRN
  Administered 2018-08-25: 200 ug via INTRACORONARY

## 2018-08-25 MED ORDER — SODIUM CHLORIDE 0.9 % IV SOLN
4.0000 ug/kg/min | INTRAVENOUS | Status: AC
  Filled 2018-08-25: qty 50

## 2018-08-25 MED ORDER — INSULIN ASPART 100 UNIT/ML IV SOLN
5.0000 [IU] | Freq: Once | INTRAVENOUS | Status: AC
  Administered 2018-08-25: 5 [IU] via SUBCUTANEOUS

## 2018-08-25 MED ORDER — VERAPAMIL HCL 2.5 MG/ML IV SOLN
INTRAVENOUS | Status: DC | PRN
  Administered 2018-08-25: 14:00:00 via INTRA_ARTERIAL

## 2018-08-25 MED ORDER — LABETALOL HCL 5 MG/ML IV SOLN: 10.0000 mg | INTRAVENOUS | Status: AC | PRN

## 2018-08-25 MED ORDER — INSULIN ASPART 100 UNIT/ML IV SOLN: 5.0000 [IU] | Freq: Once | INTRAVENOUS | Status: DC

## 2018-08-25 MED ORDER — LIDOCAINE HCL (PF) 1 % IJ SOLN
INTRAMUSCULAR | Status: DC | PRN
  Administered 2018-08-25: 2 mL

## 2018-08-25 MED ORDER — CLOPIDOGREL BISULFATE 75 MG PO TABS
75.0000 mg | ORAL_TABLET | Freq: Every day | ORAL | Status: DC
  Administered 2018-08-26 – 2018-08-30 (×4): 75 mg via ORAL
  Filled 2018-08-25 (×5): qty 1

## 2018-08-25 MED ORDER — IOHEXOL 350 MG/ML SOLN
INTRAVENOUS | Status: DC | PRN
  Administered 2018-08-25: 145 mL via INTRACARDIAC

## 2018-08-25 MED ORDER — INSULIN ASPART 100 UNIT/ML ~~LOC~~ SOLN
0.0000 [IU] | SUBCUTANEOUS | Status: DC
  Administered 2018-08-25: 3 [IU] via SUBCUTANEOUS
  Administered 2018-08-25 – 2018-08-26 (×4): 2 [IU] via SUBCUTANEOUS

## 2018-08-25 MED ORDER — HYDRALAZINE HCL 20 MG/ML IJ SOLN: 10.0000 mg | INTRAMUSCULAR | Status: AC | PRN

## 2018-08-25 MED ORDER — INSULIN DETEMIR 100 UNIT/ML ~~LOC~~ SOLN
10.0000 [IU] | Freq: Two times a day (BID) | SUBCUTANEOUS | Status: DC
  Administered 2018-08-25 – 2018-08-26 (×3): 10 [IU] via SUBCUTANEOUS
  Filled 2018-08-25 (×5): qty 0.1

## 2018-08-25 MED ORDER — NITROGLYCERIN 1 MG/10 ML FOR IR/CATH LAB
INTRA_ARTERIAL | Status: AC
  Filled 2018-08-25: qty 10

## 2018-08-25 MED ORDER — MIDAZOLAM HCL 2 MG/2ML IJ SOLN
INTRAMUSCULAR | Status: DC | PRN
  Administered 2018-08-25 (×2): 1 mg via INTRAVENOUS

## 2018-08-25 MED ORDER — ASPIRIN EC 81 MG PO TBEC
81.0000 mg | DELAYED_RELEASE_TABLET | Freq: Every day | ORAL | Status: DC
  Administered 2018-08-26 – 2018-08-30 (×5): 81 mg via ORAL
  Filled 2018-08-25 (×5): qty 1

## 2018-08-25 MED ORDER — LIDOCAINE HCL (PF) 1 % IJ SOLN
INTRAMUSCULAR | Status: AC
  Filled 2018-08-25: qty 30

## 2018-08-25 MED ORDER — CANGRELOR TETRASODIUM 50 MG IV SOLR
INTRAVENOUS | Status: AC
  Filled 2018-08-25: qty 50

## 2018-08-25 MED ORDER — HEPARIN SODIUM (PORCINE) 1000 UNIT/ML IJ SOLN
INTRAMUSCULAR | Status: AC
  Filled 2018-08-25: qty 1

## 2018-08-25 MED ORDER — FENTANYL CITRATE (PF) 100 MCG/2ML IJ SOLN
INTRAMUSCULAR | Status: AC
  Filled 2018-08-25: qty 2

## 2018-08-25 MED ORDER — MIDAZOLAM HCL 2 MG/2ML IJ SOLN
INTRAMUSCULAR | Status: AC
  Filled 2018-08-25: qty 2

## 2018-08-25 MED ORDER — HEPARIN (PORCINE) IN NACL 1000-0.9 UT/500ML-% IV SOLN
INTRAVENOUS | Status: AC
  Filled 2018-08-25: qty 500

## 2018-08-25 MED ORDER — LORAZEPAM 2 MG/ML IJ SOLN
0.5000 mg | Freq: Four times a day (QID) | INTRAMUSCULAR | Status: DC | PRN
  Administered 2018-08-25 – 2018-08-27 (×3): 0.5 mg via INTRAVENOUS
  Filled 2018-08-25 (×4): qty 1

## 2018-08-25 MED ORDER — HEPARIN (PORCINE) IN NACL 1000-0.9 UT/500ML-% IV SOLN
INTRAVENOUS | Status: DC | PRN
  Administered 2018-08-25 (×2): 500 mL

## 2018-08-25 MED ORDER — VERAPAMIL HCL 2.5 MG/ML IV SOLN
INTRAVENOUS | Status: AC
  Filled 2018-08-25: qty 2

## 2018-08-25 MED ORDER — ACETAMINOPHEN 325 MG PO TABS
650.0000 mg | ORAL_TABLET | ORAL | Status: DC | PRN
  Filled 2018-08-25: qty 2

## 2018-08-25 MED ORDER — POTASSIUM CHLORIDE CRYS ER 20 MEQ PO TBCR
40.0000 meq | EXTENDED_RELEASE_TABLET | Freq: Once | ORAL | Status: AC
  Administered 2018-08-25: 10:00:00 40 meq via ORAL
  Filled 2018-08-25: qty 2

## 2018-08-25 MED ORDER — SODIUM CHLORIDE 0.9 % WEIGHT BASED INFUSION
1.0000 mL/kg/h | INTRAVENOUS | Status: DC
  Administered 2018-08-25: 16:00:00 1 mL/kg/h via INTRAVENOUS

## 2018-08-25 MED ORDER — SODIUM CHLORIDE 0.9 % IV SOLN
250.0000 mL | INTRAVENOUS | Status: DC | PRN
  Administered 2018-08-26: 21:00:00 250 mL via INTRAVENOUS

## 2018-08-25 MED ORDER — FENTANYL CITRATE (PF) 100 MCG/2ML IJ SOLN
INTRAMUSCULAR | Status: DC | PRN
  Administered 2018-08-25 (×2): 25 ug via INTRAVENOUS

## 2018-08-25 MED ORDER — HEPARIN SODIUM (PORCINE) 1000 UNIT/ML IJ SOLN
INTRAMUSCULAR | Status: DC | PRN
  Administered 2018-08-25: 3000 [IU] via INTRAVENOUS
  Administered 2018-08-25: 4000 [IU] via INTRAVENOUS
  Administered 2018-08-25: 3500 [IU] via INTRAVENOUS

## 2018-08-25 MED ORDER — SODIUM CHLORIDE 0.9% FLUSH: 3.0000 mL | INTRAVENOUS | Status: DC | PRN

## 2018-08-25 MED ORDER — ATORVASTATIN CALCIUM 80 MG PO TABS
80.0000 mg | ORAL_TABLET | Freq: Every day | ORAL | Status: DC
  Administered 2018-08-25 – 2018-08-29 (×5): 80 mg via ORAL
  Filled 2018-08-25 (×5): qty 1

## 2018-08-25 MED ORDER — CLOPIDOGREL BISULFATE 300 MG PO TABS
600.0000 mg | ORAL_TABLET | Freq: Once | ORAL | Status: AC
  Administered 2018-08-25: 18:00:00 600 mg via ORAL
  Filled 2018-08-25: qty 2

## 2018-08-25 MED ORDER — SODIUM CHLORIDE 0.9 % IV SOLN
INTRAVENOUS | Status: AC | PRN
  Administered 2018-08-25: 4 ug/kg/min via INTRAVENOUS

## 2018-08-25 MED ORDER — SODIUM CHLORIDE 0.9% FLUSH
3.0000 mL | Freq: Two times a day (BID) | INTRAVENOUS | Status: DC
  Administered 2018-08-26 – 2018-08-30 (×6): 3 mL via INTRAVENOUS

## 2018-08-25 SURGICAL SUPPLY — 20 items
BALLN SAPPHIRE 2.0X12 (BALLOONS) ×2
BALLN SAPPHIRE 2.5X12 (BALLOONS) ×2
BALLN SAPPHIRE ~~LOC~~ 3.25X12 (BALLOONS) ×1 IMPLANT
BALLOON SAPPHIRE 2.0X12 (BALLOONS) IMPLANT
BALLOON SAPPHIRE 2.5X12 (BALLOONS) IMPLANT
CATH 5FR JL3.5 JR4 ANG PIG MP (CATHETERS) ×1 IMPLANT
CATH LAUNCHER 6FR EBU3.5 (CATHETERS) ×1 IMPLANT
DEVICE RAD COMP TR BAND LRG (VASCULAR PRODUCTS) ×1 IMPLANT
GLIDESHEATH SLEND SS 6F .021 (SHEATH) ×1 IMPLANT
GUIDEWIRE INQWIRE 1.5J.035X260 (WIRE) IMPLANT
INQWIRE 1.5J .035X260CM (WIRE) ×2
KIT ENCORE 26 ADVANTAGE (KITS) ×1 IMPLANT
KIT HEART LEFT (KITS) ×2 IMPLANT
PACK CARDIAC CATHETERIZATION (CUSTOM PROCEDURE TRAY) ×2 IMPLANT
STENT SYNERGY DES 2.25X12 (Permanent Stent) ×1 IMPLANT
STENT SYNERGY DES 3X20 (Permanent Stent) ×1 IMPLANT
TRANSDUCER W/STOPCOCK (MISCELLANEOUS) ×2 IMPLANT
TUBING CIL FLEX 10 FLL-RA (TUBING) ×2 IMPLANT
WIRE COUGAR XT STRL 190CM (WIRE) ×1 IMPLANT
WIRE HI TORQ WHISPER MS 190CM (WIRE) ×1 IMPLANT

## 2018-08-25 NOTE — Progress Notes (Signed)
Noted pt.was confused & found cortrack feeding tube was accidentally pulled by patient.MD on call Ogan made aware & said to notify the MD this am when they make rounds.Will endorse to the day shift nurse.

## 2018-08-25 NOTE — Progress Notes (Addendum)
PT Cancellation Note  Patient Details Name: HAARIS METALLO MRN: [id number] DOB: 1940/11/19   Cancelled Treatment:    Reason Eval/Treat Not Completed: (P) Patient at procedure or test/unavailable Pt currently in Cath Lab. PT will follow back tomorrow for  Evaluation.  Nyasiah Moffet B. Migdalia Dk PT, DPT Acute Rehabilitation Services Pager [phone] Office [phone]    [place] 08/25/2018, 2:23 PM

## 2018-08-25 NOTE — Progress Notes (Addendum)
[place] Progress Note Patient Name: [name] [name] DOB: 1940/11/11 MRN: [id number]   Date of Service  08/25/2018  HPI/Events of Note  Hyperglycemia (BS 260 mg %)  eICU Interventions  Novolog 5 units sq x 1        [place] U Ogan 08/25/2018, 1:21 AM

## 2018-08-25 NOTE — Progress Notes (Signed)
Progress Note  Patient Name: [name] [name] Date of Encounter: 08/25/2018  Primary Cardiologist: Dr. Burt Knack  Subjective   Denies chest pain but wants to go home today  Inpatient Medications    Scheduled Meds: . amiodarone  400 mg Oral BID   Followed by  . [START ON 08/31/2018] amiodarone  200 mg Oral Daily  . amLODipine  10 mg Oral Daily  . aspirin  81 mg Per Tube Daily  . atorvastatin  80 mg Per Tube q1800  . insulin aspart  2-6 Units Subcutaneous Q4H  . insulin detemir  10 Units Subcutaneous Q12H  . levothyroxine  75 mcg Oral Daily  . metoprolol tartrate  25 mg Oral TID  . potassium chloride  40 mEq Oral Once  . sodium chloride flush  3 mL Intravenous Q12H   Continuous Infusions: . sodium chloride 10 mL/hr at 08/24/18 1100  . sodium chloride    . sodium chloride 1 mL/kg/hr (08/25/18 0503)  . ceFEPime (MAXIPIME) IV Stopped (08/24/18 2359)  . heparin 1,950 Units/hr (08/25/18 0359)   PRN Meds: sodium chloride, fluticasone, hydrALAZINE, ipratropium-albuterol, Melatonin, Resource ThickenUp Clear, sodium chloride flush   Vital Signs    Vitals:   08/25/18 0720 08/25/18 0725 08/25/18 0730 08/25/18 0804  BP:   (!) 151/80 (!) 162/149  Pulse: 99 93 99 (!) 55  Resp: (!) 24 13 18  (!) 27  Temp:   97.7 F (36.5 C)   TempSrc:   Oral   SpO2: 95% 96% 94% 95%  Weight:      Height:        Intake/Output Summary (Last 24 hours) at 08/25/2018 0810 Last data filed at 08/25/2018 0503 Gross per 24 hour  Intake 1550.26 ml  Output 1400 ml  Net 150.26 ml   Last 3 Weights 08/24/2018 08/23/2018 08/22/2018  Weight (lbs) 169 lb 12.1 oz 174 lb 6.1 oz 173 lb 4.5 oz  Weight (kg) 77 kg 79.1 kg 78.6 kg      Telemetry    Sinus rhythm/sinus tachycardia- Personally Reviewed  ECG    Not performed today - Personally Reviewed  Physical Exam   GEN: No acute distress.   Neck: No JVD Cardiac: RRR, no murmurs, rubs, or gallops.  Respiratory: Clear to auscultation bilaterally. GI: Soft,  nontender, non-distended  MS: No edema; No deformity. Neuro:  Nonfocal  Psych: Normal affect   Labs    Chemistry Recent Labs  Lab 08/23/18 1240 08/24/18 0438 08/25/18 0645  NA 143 146* 146*  K 3.0* 3.0* 2.8*  CL 113* 114* 113*  CO2 20* 23 25  GLUCOSE 163* 180* 212*  BUN 29* 22 17  CREATININE 1.03 0.90 0.81  CALCIUM 8.6* 8.6* 8.4*  GFRNONAA >60 >60 >60  GFRAA >60 >60 >60  ANIONGAP 10 9 8      Hematology Recent Labs  Lab 08/23/18 0430 08/24/18 0438 08/25/18 0400  WBC 8.0 8.6 8.7  RBC 3.45* 3.80* 3.78*  HGB 9.2* 10.1* 10.1*  HCT 29.0* 31.7* 31.5*  MCV 84.1 83.4 83.3  MCH 26.7 26.6 26.7  MCHC 31.7 31.9 32.1  RDW 15.0 14.8 15.1  PLT 186 232 226    Cardiac Enzymes Recent Labs  Lab 08/18/18 1224 08/19/18 0824  TROPONINI 2.77* 2.03*   No results for input(s): TROPIPOC in the last 168 hours.   BNPNo results for input(s): BNP, PROBNP in the last 168 hours.   DDimer No results for input(s): DDIMER in the last 168 hours.   Radiology  No results found.  Cardiac Studies   2D Echo 08/19/18   1. The left ventricle has normal systolic function, with an ejection fraction of 55-60%. The cavity size was normal. There is mildly increased left ventricular wall thickness. Left ventricular diastolic Doppler parameters are consistent with impaired  relaxation.  2. The right ventricle has normal systolic function. The cavity was normal. There is no increase in right ventricular wall thickness.  3. The mitral valve is abnormal. Mild thickening of the mitral valve leaflet.  4. The aortic valve is tricuspid. Mild thickening of the aortic valve. Mild calcification of the aortic valve.  LHC - pending. scheduled for today  Patient Profile     77 y.o. male with type 2 diabetes, hypertension, and mixed hyperlipidemia, admitted 08/17/2018 with out of hospital ventricular fibrillation arrest  Assessment & Plan    1. Out of Hospital Ventricular Fibrillation Arrest: occurred in  the setting of a choking episode. Initial rhythm was VF and troponin elevated. Echo showed normal LVEF. He will need cardiac cath. Plan for today.   2. Elevated Troponin: troponin peaked at 2.77 on 5/1 and started to show downward trend on 5/2, down to 2.03. Remains on IV heparin. Plan is for ischemic evaluation, LHC, today.   3. Atrial Fibrillation w/ RVR: conversion to NSR w/ amiodarone. CHA2DS2 VASc score Is 5 = 7.2% stroke rate/ year. Continue IV heparin for a/c until all invasive procedures are completed, then transition to [place]. Continue to monitor on tele.   4. Acute Hypoxemic Respiratory Failure: resolved. Was extubated on 08/23/18.   5. AKI: resolved. Scr 0.81 today.   6. Hypokalemia: 2.8 this morning. Supplemental K ordered, 40 mEq PO given at 0800 w/ plans to repeat dose of 40 mEq 2 hrs later @1000  and repeat BMP and Mg lab at 1200. Monitor closely. Remains on tele.   7. HTN: elevated this morning at 162/149. Scheduled to get amlodipine and metoprolol. Continue to monitor closely. Now that renal function has improved, consider addition of an ARB or spironolactone, as addition will also help w/ hypokalemia.   8. T2DM: management per primary team.   9. Acute Diastolic HF: was diuresed to euvolemic state. Diuretics now on hold due to hypokalemia.     For questions or updates, please contact [place] Please consult [email or web address] for contact info under        Signed, Lyda Jester, PA-C  08/25/2018, 8:10 AM    Agree with note written by Ellen Henri  [facility]  Patient was admitted with VF arrest on 08/17/2018.  He was extubated and sent to the telemetry floor yesterday.  His EF was normal by 2D echo.  His troponins were mildly elevated.  He did have A. fib with which converted to sinus rhythm and will need to be placed on oral anticoagulant.  Other problems include hypertension and diabetes.  He was significantly hypokalemic this morning at 2.8 and this has been repleted.   He needs diagnostic coronary angiography to rule out an ischemic etiology.  The patient unfortunately wishes to go home and I attempted to convince him otherwise.Marland Kitchen He remains on IV heparin.  He is on a beta-blocker as well as amiodarone.  I am concerned about medication compliance at home.  The patient understands that risks included but are not limited to stroke (1 in 1000), death (1 in 59), kidney failure [usually temporary] (1 in 500), bleeding (1 in 200), allergic reaction [possibly serious] (1 in 200). The patient understands and agrees  to proceed Quay Burow 08/25/2018 11:06 AM

## 2018-08-25 NOTE — Progress Notes (Signed)
Noted right PICC malpositioned on am CXR.  It is no longer central.  Floor RN noted may not need PICC anymore depending on disposition after cath today.  If patient stays in hospital or needing continued IV therapy consider exchange vs removing PICC.  Left PICC in place to wait orders in this regard.

## 2018-08-25 NOTE — Progress Notes (Addendum)
ANTICOAGULATION CONSULT NOTE - Follow-Up Consult  Pharmacy Consult for Heparin Indication: atrial fibrillation  Not on File  Patient Measurements: Height: 5\' 11"  (180.3 cm) Weight: 169 lb 12.1 oz (77 kg) IBW/kg (Calculated) : 75.3  Vital Signs: Temp: 97.7 F (36.5 C) (05/08 0730) Temp Source: Oral (05/08 0730) BP: 162/149 (05/08 0804) Pulse Rate: 55 (05/08 0804)  Labs: Recent Labs    08/23/18 0430 08/23/18 1240  08/24/18 0438 08/24/18 1300 08/24/18 1938 08/25/18 0400 08/25/18 0645  HGB 9.2*  --   --  10.1*  --   --  10.1*  --   HCT 29.0*  --   --  31.7*  --   --  31.5*  --   PLT 186  --   --  232  --   --  226  --   HEPARINUNFRC 0.29* 0.34   < > 0.17* 0.40 0.54 0.61  --   CREATININE 0.95 1.03  --  0.90  --   --   --  0.81   < > = values in this interval not displayed.    Estimated Creatinine Clearance: 81.3 mL/min (by C-G formula based on SCr of 0.81 mg/dL).  Assessment: [name] admitted 08/17/18 with out of hospital VFib arrest. Pharmacy now consulted to dose heparin for Afib. Pt not on AC PTA.   Heparin remains therapeutic on recheck this morning. Note he is scheduled for cath today.  He is on Day 4 of a planned 8 day course of Cefepime for pneumonia. His renal function remains stable.  Goal of Therapy:  Heparin level 0.3-0.7 units/ml Monitor platelets by anticoagulation protocol: Yes   Plan:  Continue heparin 1950 units/hr Follow-up after cath for anticoag plan for afib.  Continue cefepime as planned. As no dosing adjustments are anticipated pharmacy will sign off of cefepime dosing.   Legrand Como, Pharm.D., BCPS, BCIDP Clinical Pharmacist Phone: [phone] Please check AMION for all [place] numbers 08/25/2018, 11:52 AM

## 2018-08-25 NOTE — Interval H&P Note (Signed)
History and Physical Interval Note:  08/25/2018 2:21 PM  [name] [name]  has presented today for surgery, with the diagnosis of post cardiac arrest.  The various methods of treatment have been discussed with the patient and family. After consideration of risks, benefits and other options for treatment, the patient has consented to  Procedure(s): LEFT HEART CATH AND CORONARY ANGIOGRAPHY (N/A) as a surgical intervention.  The patient's history has been reviewed, patient examined, no change in status, stable for surgery.  I have reviewed the patient's chart and labs.  Questions were answered to the patient's satisfaction.     Sherren Mocha

## 2018-08-25 NOTE — H&P (View-Only) (Signed)
Progress Note  Patient Name: [name] [name] Date of Encounter: 08/25/2018  Primary Cardiologist: Dr. Burt Knack  Subjective   Denies chest pain but wants to go home today  Inpatient Medications    Scheduled Meds: . amiodarone  400 mg Oral BID   Followed by  . [START ON 08/31/2018] amiodarone  200 mg Oral Daily  . amLODipine  10 mg Oral Daily  . aspirin  81 mg Per Tube Daily  . atorvastatin  80 mg Per Tube q1800  . insulin aspart  2-6 Units Subcutaneous Q4H  . insulin detemir  10 Units Subcutaneous Q12H  . levothyroxine  75 mcg Oral Daily  . metoprolol tartrate  25 mg Oral TID  . potassium chloride  40 mEq Oral Once  . sodium chloride flush  3 mL Intravenous Q12H   Continuous Infusions: . sodium chloride 10 mL/hr at 08/24/18 1100  . sodium chloride    . sodium chloride 1 mL/kg/hr (08/25/18 0503)  . ceFEPime (MAXIPIME) IV Stopped (08/24/18 2359)  . heparin 1,950 Units/hr (08/25/18 0359)   PRN Meds: sodium chloride, fluticasone, hydrALAZINE, ipratropium-albuterol, Melatonin, Resource ThickenUp Clear, sodium chloride flush   Vital Signs    Vitals:   08/25/18 0720 08/25/18 0725 08/25/18 0730 08/25/18 0804  BP:   (!) 151/80 (!) 162/149  Pulse: 99 93 99 (!) 55  Resp: (!) 24 13 18  (!) 27  Temp:   97.7 F (36.5 C)   TempSrc:   Oral   SpO2: 95% 96% 94% 95%  Weight:      Height:        Intake/Output Summary (Last 24 hours) at 08/25/2018 0810 Last data filed at 08/25/2018 0503 Gross per 24 hour  Intake 1550.26 ml  Output 1400 ml  Net 150.26 ml   Last 3 Weights 08/24/2018 08/23/2018 08/22/2018  Weight (lbs) 169 lb 12.1 oz 174 lb 6.1 oz 173 lb 4.5 oz  Weight (kg) 77 kg 79.1 kg 78.6 kg      Telemetry    Sinus rhythm/sinus tachycardia- Personally Reviewed  ECG    Not performed today - Personally Reviewed  Physical Exam   GEN: No acute distress.   Neck: No JVD Cardiac: RRR, no murmurs, rubs, or gallops.  Respiratory: Clear to auscultation bilaterally. GI: Soft,  nontender, non-distended  MS: No edema; No deformity. Neuro:  Nonfocal  Psych: Normal affect   Labs    Chemistry Recent Labs  Lab 08/23/18 1240 08/24/18 0438 08/25/18 0645  NA 143 146* 146*  K 3.0* 3.0* 2.8*  CL 113* 114* 113*  CO2 20* 23 25  GLUCOSE 163* 180* 212*  BUN 29* 22 17  CREATININE 1.03 0.90 0.81  CALCIUM 8.6* 8.6* 8.4*  GFRNONAA >60 >60 >60  GFRAA >60 >60 >60  ANIONGAP 10 9 8      Hematology Recent Labs  Lab 08/23/18 0430 08/24/18 0438 08/25/18 0400  WBC 8.0 8.6 8.7  RBC 3.45* 3.80* 3.78*  HGB 9.2* 10.1* 10.1*  HCT 29.0* 31.7* 31.5*  MCV 84.1 83.4 83.3  MCH 26.7 26.6 26.7  MCHC 31.7 31.9 32.1  RDW 15.0 14.8 15.1  PLT 186 232 226    Cardiac Enzymes Recent Labs  Lab 08/18/18 1224 08/19/18 0824  TROPONINI 2.77* 2.03*   No results for input(s): TROPIPOC in the last 168 hours.   BNPNo results for input(s): BNP, PROBNP in the last 168 hours.   DDimer No results for input(s): DDIMER in the last 168 hours.   Radiology  No results found.  Cardiac Studies   2D Echo 08/19/18   1. The left ventricle has normal systolic function, with an ejection fraction of 55-60%. The cavity size was normal. There is mildly increased left ventricular wall thickness. Left ventricular diastolic Doppler parameters are consistent with impaired  relaxation.  2. The right ventricle has normal systolic function. The cavity was normal. There is no increase in right ventricular wall thickness.  3. The mitral valve is abnormal. Mild thickening of the mitral valve leaflet.  4. The aortic valve is tricuspid. Mild thickening of the aortic valve. Mild calcification of the aortic valve.  LHC - pending. scheduled for today  Patient Profile     78 y.o. male with type 2 diabetes, hypertension, and mixed hyperlipidemia, admitted 08/17/2018 with out of hospital ventricular fibrillation arrest  Assessment & Plan    1. Out of Hospital Ventricular Fibrillation Arrest: occurred in  the setting of a choking episode. Initial rhythm was VF and troponin elevated. Echo showed normal LVEF. He will need cardiac cath. Plan for today.   2. Elevated Troponin: troponin peaked at 2.77 on 5/1 and started to show downward trend on 5/2, down to 2.03. Remains on IV heparin. Plan is for ischemic evaluation, LHC, today.   3. Atrial Fibrillation w/ RVR: conversion to NSR w/ amiodarone. CHA2DS2 VASc score Is 5 = 7.2% stroke rate/ year. Continue IV heparin for a/c until all invasive procedures are completed, then transition to [place]. Continue to monitor on tele.   4. Acute Hypoxemic Respiratory Failure: resolved. Was extubated on 08/23/18.   5. AKI: resolved. Scr 0.81 today.   6. Hypokalemia: 2.8 this morning. Supplemental K ordered, 40 mEq PO given at 0800 w/ plans to repeat dose of 40 mEq 2 hrs later @1000  and repeat BMP and Mg lab at 1200. Monitor closely. Remains on tele.   7. HTN: elevated this morning at 162/149. Scheduled to get amlodipine and metoprolol. Continue to monitor closely. Now that renal function has improved, consider addition of an ARB or spironolactone, as addition will also help w/ hypokalemia.   8. T2DM: management per primary team.   9. Acute Diastolic HF: was diuresed to euvolemic state. Diuretics now on hold due to hypokalemia.     For questions or updates, please contact [place] Please consult [email or web address] for contact info under        Signed, Lyda Jester, PA-C  08/25/2018, 8:10 AM    Agree with note written by Ellen Henri  [facility]  Patient was admitted with VF arrest on 08/17/2018.  He was extubated and sent to the telemetry floor yesterday.  His EF was normal by 2D echo.  His troponins were mildly elevated.  He did have A. fib with which converted to sinus rhythm and will need to be placed on oral anticoagulant.  Other problems include hypertension and diabetes.  He was significantly hypokalemic this morning at 2.8 and this has been repleted.   He needs diagnostic coronary angiography to rule out an ischemic etiology.  The patient unfortunately wishes to go home and I attempted to convince him otherwise.Marland Kitchen He remains on IV heparin.  He is on a beta-blocker as well as amiodarone.  I am concerned about medication compliance at home.  The patient understands that risks included but are not limited to stroke (1 in 1000), death (1 in 25), kidney failure [usually temporary] (1 in 500), bleeding (1 in 200), allergic reaction [possibly serious] (1 in 200). The patient understands and agrees  to proceed Quay Burow 08/25/2018 11:06 AM

## 2018-08-25 NOTE — Progress Notes (Signed)
Pt became agitated pulling on equipment states he wants to go home, has been restless since  He came from [facility] , states hes going home today, flailing right arm , hence not being kept steady,  frequent checks is being done to site. Had to be repositioned I bed as he was trying to get off, Dr paged, ativan was ordered. At this time patient resting in bed , but still a bit talkative, still flails right hand  In bed,  Has a sitter.care and observation continues.

## 2018-08-25 NOTE — Progress Notes (Addendum)
Inpatient Diabetes Program Recommendations  AACE/ADA: New Consensus Statement on Inpatient Glycemic Control (2015)  Target Ranges:  Prepandial:   less than 140 mg/dL      Peak postprandial:   less than 180 mg/dL (1-2 hours)      Critically ill patients:  140 - 180 mg/dL   Results for [name] [name], [name] [name] (MRN [id number]) as of 08/25/2018 09:40  Ref. Range 08/23/2018 23:16 08/24/2018 03:12 08/24/2018 07:32 08/24/2018 11:16 08/24/2018 16:35 08/24/2018 16:41 08/24/2018 20:31  Glucose-Capillary Latest Ref Range: 70 - 99 mg/dL 208 (H)  6 units NOVOLOG  177 (H)  4 units NOVOLOG  163 (H)  4 units NOVOLOG  253 (H)  6 units NOVOLOG +  10 units LEVEMIR given at 10am  252 (H)  6 units NOVOLOG  230 (H) 322 (H)  5 units NOVOLOG +  10 units LEVEMIR given at 11pm    Results for [name] [name], [name] [name] (MRN [id number]) as of 08/25/2018 09:40  Ref. Range 08/25/2018 00:32 08/25/2018 04:24 08/25/2018 07:34  Glucose-Capillary Latest Ref Range: 70 - 99 mg/dL 266 (H)  5 units NOVOLOG  221 (H)  6 units NOVOLOG  188 (H)  4 units NOVOLOG     Home DM Meds: Levemir 50 units Daily                             Metformin 1000 mg BID  Current Orders: None      Note patient Extubated 05/06.  Transferred to floor.  NPO for Cardiac Cath today.    MD- Note that Levemir and Novolog stopped this AM.  Please consider the following in-hospital insulin adjustments:   1. Start Novolog Moderate Correction Scale/ SSI (0-15 units) Q4 hours    2. Increase/ Start back Levemir 15 units BID     --Will follow patient during hospitalization--  Wyn Quaker RN, MSN, CDE Diabetes Coordinator Inpatient Glycemic Control Team Team Pager: [phone] (8a-5p)

## 2018-08-25 NOTE — Progress Notes (Signed)
  Received notification from cath lab and floor RN that K is 2.8. Will give 40 mEq PO Kdur now and repeat dose in 2 hrs. Will repeat BMP and check Mg at 1200. Continue to keep NPO for cath today.

## 2018-08-25 NOTE — Progress Notes (Addendum)
[facility] Progress Note  Patient: [name] [name] GQQ:[id number]   PCP: Chipper Herb, MD DOB: 03-03-1941   DOA: 08/17/2018   DOS: 08/25/2018   Date of Service: the patient was seen and examined on 08/25/2018  Brief hospital course: Pt. with PMH of diabetes; admitted on 08/17/2018, presented with complaint of cardiac arrest, was found to have V. fib arrest.  Bystander CPR.  ACLS chloride patient actually required 2 shocks for V. fib arrest.  Brought to the emergency department in comatose condition.  Underwent therapeutic hypothermia protocol.Cardiology was consulted and S/P cardiac catheterization Currently further plan is treatment of pneumonia.  Subjective: Patient is frustrated and wants to go home.  No nausea no vomiting.  No chest pain.  Currently on 9 L of oxygen.  Assessment and Plan: Status-post cardiac arrest V. Fib cardiac arrest Likely preceded by hypoglycemia however initial rhythm VF so will need to consider potential ischemic event. Peak troponin 2.77. S/p hypothermia protocol on 4/30. TTE with normal EF. - Cardiology consultation appreciated.   Hafnia Alvei pneumonia Sensitive to Cefepime, Cipro and Bactrim - F/u blood cultures - Continue Cefepime. Plan for 7 day course (end date 5/12)  Coronary artery disease S/P PCI Severe proximal LAD stenosis, treated successfully with PCI using a 3.0 x 20 mm Synergy DES Severe mid circumflex stenosis, treated successfully with a 2.25 x 12 mm Synergy DES Moderate distal RCA stenosis, nonobstructive Plan is aspirin for 30 days only and Plavix for at least 6 months  pAFRVR - Transition to oral amiodarone. - Continue Heparin gtt, transition to oral apixaban tomorrow per cardiology.  Hypertension - Amlodipine - PRN hydralazine  Hypokalemia - Continue to replete - BMP at 1400  DMII, hyperglycemia  - CBGs q4H with SSI  - Off Insulin gtt  Nutritional Support - Swallow evaluation.  Acute encephalopathy. Will use  Ativan. QT prolonged cannot use Haldol.  Diet: Cardiac DVT Prophylaxis: subcutaneous Heparin  Advance goals of care discussion: Full code  Family Communication: no family was present at bedside, at the time of interview.  Discussed with patient's son  Opportunity was given to ask question and all questions were answered satisfactorily.   Disposition:  Discharge to home.  Consultants: Cardiology Procedures: Cardiac cath  Scheduled Meds: . amiodarone  400 mg Oral BID   Followed by  . [START ON 08/31/2018] amiodarone  200 mg Oral Daily  . amLODipine  10 mg Oral Daily  . [START ON 08/26/2018] apixaban  5 mg Oral BID  . [START ON 08/26/2018] aspirin EC  81 mg Oral Daily  . atorvastatin  80 mg Oral q1800  . [START ON 08/26/2018] clopidogrel  75 mg Oral Q breakfast  . insulin aspart  0-9 Units Subcutaneous Q4H  . insulin detemir  10 Units Subcutaneous BID  . levothyroxine  75 mcg Oral Daily  . metoprolol tartrate  25 mg Oral TID  . [START ON 08/26/2018] sodium chloride flush  3 mL Intravenous Q12H   Continuous Infusions: . sodium chloride 10 mL/hr at 08/24/18 1100  . [START ON 08/26/2018] sodium chloride    . sodium chloride 1 mL/kg/hr (08/25/18 1618)  . ceFEPime (MAXIPIME) IV 2 g (08/25/18 1004)   PRN Meds: [START ON 08/26/2018] sodium chloride, acetaminophen, fluticasone, hydrALAZINE, hydrALAZINE, ipratropium-albuterol, labetalol, LORazepam, Melatonin, ondansetron (ZOFRAN) IV, Resource ThickenUp Clear, [START ON 08/26/2018] sodium chloride flush Antibiotics: Anti-infectives (From admission, onward)   Start     Dose/Rate Route Frequency Ordered Stop   08/22/18 1000  ceFEPIme (MAXIPIME) 2  g in sodium chloride 0.9 % 100 mL IVPB     2 g 200 mL/hr over 30 Minutes Intravenous Every 12 hours 08/22/18 0828 08/29/18 0959   08/21/18 0600  vancomycin (VANCOCIN) IVPB 750 mg/150 ml premix  Status:  Discontinued     750 mg 150 mL/hr over 60 Minutes Intravenous Every 12 hours 08/20/18 1636 08/22/18 0817    08/20/18 1700  vancomycin (VANCOCIN) IVPB 1000 mg/200 mL premix     1,000 mg 200 mL/hr over 60 Minutes Intravenous  Once 08/20/18 1636 08/20/18 1928   08/20/18 0900  piperacillin-tazobactam (ZOSYN) IVPB 3.375 g  Status:  Discontinued     3.375 g 12.5 mL/hr over 240 Minutes Intravenous Every 8 hours 08/20/18 0753 08/22/18 0819   08/18/18 1800  azithromycin (ZITHROMAX) 500 mg in sodium chloride 0.9 % 250 mL IVPB  Status:  Discontinued     500 mg 250 mL/hr over 60 Minutes Intravenous Every 24 hours 08/17/18 1756 08/18/18 0823   08/18/18 1800  cefTRIAXone (ROCEPHIN) 1 g in sodium chloride 0.9 % 100 mL IVPB  Status:  Discontinued     1 g 200 mL/hr over 30 Minutes Intravenous Every 24 hours 08/17/18 1756 08/18/18 0823   08/18/18 0930  Ampicillin-Sulbactam (UNASYN) 3 g in sodium chloride 0.9 % 100 mL IVPB     3 g 200 mL/hr over 30 Minutes Intravenous Every 8 hours 08/18/18 0823 08/20/18 0116   08/17/18 2000  cefTRIAXone (ROCEPHIN) 1 g in sodium chloride 0.9 % 100 mL IVPB     1 g 200 mL/hr over 30 Minutes Intravenous  Once 08/17/18 1932 08/17/18 2258   08/17/18 2000  azithromycin (ZITHROMAX) 500 mg in sodium chloride 0.9 % 250 mL IVPB     500 mg 250 mL/hr over 60 Minutes Intravenous  Once 08/17/18 1932 08/18/18 0000   08/17/18 1730  cefTRIAXone (ROCEPHIN) 1 g in sodium chloride 0.9 % 100 mL IVPB  Status:  Discontinued     1 g 200 mL/hr over 30 Minutes Intravenous  Once 08/17/18 1723 08/17/18 1931   08/17/18 1730  azithromycin (ZITHROMAX) 500 mg in sodium chloride 0.9 % 250 mL IVPB  Status:  Discontinued     500 mg 250 mL/hr over 60 Minutes Intravenous  Once 08/17/18 1723 08/17/18 1932       Objective: Physical Exam: Vitals:   08/25/18 1511 08/25/18 1516 08/25/18 1521 08/25/18 1620  BP: (!) 189/87 (!) 172/80 (!) 187/89 (!) 170/87  Pulse: (!) 103 100 (!) 102   Resp: (!) 29 (!) 21 12   Temp:    98.2 F (36.8 C)  TempSrc:    Axillary  SpO2: 91% 97% 97%   Weight:      Height:         Intake/Output Summary (Last 24 hours) at 08/25/2018 1918 Last data filed at 08/25/2018 1600 Gross per 24 hour  Intake 553.41 ml  Output 1250 ml  Net -696.59 ml   Filed Weights   08/22/18 0500 08/23/18 0500 08/24/18 0500  Weight: 78.6 kg 79.1 kg 77 kg   General: Alert, Awake and Oriented to Time, Place and Person. Appear in moderate distress, affect irritable Eyes: PERRL, Conjunctiva normal ENT: Oral Mucosa clear moist. Neck: difficult to assess  JVD, no Abnormal Mass Or lumps Cardiovascular: S1 and S2 Present, no Murmur, Peripheral Pulses Present Respiratory: normal respiratory effort, Bilateral Air entry equal and Decreased, no use of accessory muscle, bilateral  Crackles, no wheezes Abdomen: Bowel Sound present, Soft and no tenderness,  no hernia Skin: no redness, no Rash, no induration Extremities: no Pedal edema, no calf tenderness Neurologic: Grossly no focal neuro deficit. Bilaterally Equal motor strength  Data Reviewed: CBC: Recent Labs  Lab 08/21/18 0306 08/22/18 0354 08/23/18 0430 08/24/18 0438 08/25/18 0400  WBC 10.3 9.4 8.0 8.6 8.7  HGB 10.5* 9.5* 9.2* 10.1* 10.1*  HCT 33.1* 30.1* 29.0* 31.7* 31.5*  MCV 85.5 83.8 84.1 83.4 83.3  PLT 197 203 186 232 638   Basic Metabolic Panel: Recent Labs  Lab 08/19/18 0209  08/19/18 1803  08/23/18 0430 08/23/18 1240 08/23/18 2119 08/24/18 0438 08/25/18 0645 08/25/18 1155  NA 141   < >  --    < > 144 143  --  146* 146* 148*  K 4.1   < >  --    < > 3.1* 3.0*  --  3.0* 2.8* 3.3*  CL 115*  --   --    < > 111 113*  --  114* 113* 116*  CO2 18*  --   --    < > 20* 20*  --  23 25 22   GLUCOSE 249*  --   --    < > 208* 163*  --  180* 212* 184*  BUN 10  --   --    < > 28* 29*  --  22 17 17   CREATININE 0.79  --   --    < > 0.95 1.03  --  0.90 0.81 0.87  CALCIUM 7.2*  --   --    < > 8.7* 8.6*  --  8.6* 8.4* 8.5*  MG 2.0  --  2.1  --   --   --  1.9  --   --  1.8  PHOS 1.7*  --  4.2  --   --   --   --   --   --   --    < > = values  in this interval not displayed.    Liver Function Tests: No results for input(s): AST, ALT, ALKPHOS, BILITOT, PROT, ALBUMIN in the last 168 hours. No results for input(s): LIPASE, AMYLASE in the last 168 hours. No results for input(s): AMMONIA in the last 168 hours. Coagulation Profile: No results for input(s): INR, PROTIME in the last 168 hours. Cardiac Enzymes: Recent Labs  Lab 08/19/18 0824  TROPONINI 2.03*   BNP (last 3 results) No results for input(s): PROBNP in the last 8760 hours. CBG: Recent Labs  Lab 08/24/18 2031 08/25/18 0032 08/25/18 0424 08/25/18 0734 08/25/18 1124  GLUCAP 322* 266* 221* 188* 167*   Studies: Dg Chest Port 1 View  Result Date: 08/25/2018 CLINICAL DATA:  Patient admitted 08/17/2018 with respiratory failure and acute encephalopathy. EXAM: PORTABLE CHEST 1 VIEW COMPARISON:  Single-view of the chest 08/18/2018 and 08/19/2018. FINDINGS: ETT and NG tube have been removed. Right PICC remains in place. Lung volumes are low. Bilateral airspace disease has worsened. Lung volumes are low. Heart size is normal. No pneumothorax. Small left pleural effusion. IMPRESSION: Status post extubation and NG tube removal. Increased basilar airspace opacities could be due to worsened atelectasis although progressive pneumonia and/or edema could create a similar appearance. Electronically Signed   By: Inge Rise M.D.   On: 08/25/2018 08:47     Time spent: 35 minutes  Author: Berle Mull, MD [facility] 08/25/2018 7:18 PM  To reach On-call, see care teams to locate the attending and reach out to them via [email or web address]. If 7PM-7AM, please contact  night-coverage If you still have difficulty reaching the attending provider, please page the [facility] (Director on Call) for [facility] on amion for assistance.

## 2018-08-26 ENCOUNTER — Other Ambulatory Visit: Payer: Self-pay

## 2018-08-26 LAB — GLUCOSE, CAPILLARY
Glucose-Capillary: 152 mg/dL — ABNORMAL HIGH (ref 70–99)
Glucose-Capillary: 154 mg/dL — ABNORMAL HIGH (ref 70–99)
Glucose-Capillary: 181 mg/dL — ABNORMAL HIGH (ref 70–99)
Glucose-Capillary: 196 mg/dL — ABNORMAL HIGH (ref 70–99)
Glucose-Capillary: 253 mg/dL — ABNORMAL HIGH (ref 70–99)
Glucose-Capillary: 271 mg/dL — ABNORMAL HIGH (ref 70–99)
Glucose-Capillary: 362 mg/dL — ABNORMAL HIGH (ref 70–99)
Glucose-Capillary: 69 mg/dL — ABNORMAL LOW (ref 70–99)

## 2018-08-26 LAB — T4, FREE: Free T4: 1.15 ng/dL (ref 0.82–1.77)

## 2018-08-26 LAB — CBC
HCT: 31 % — ABNORMAL LOW (ref 39.0–52.0)
Hemoglobin: 9.8 g/dL — ABNORMAL LOW (ref 13.0–17.0)
MCH: 26.5 pg (ref 26.0–34.0)
MCHC: 31.6 g/dL (ref 30.0–36.0)
MCV: 83.8 fL (ref 80.0–100.0)
Platelets: 236 10*3/uL (ref 150–400)
RBC: 3.7 MIL/uL — ABNORMAL LOW (ref 4.22–5.81)
RDW: 15.3 % (ref 11.5–15.5)
WBC: 10.7 10*3/uL — ABNORMAL HIGH (ref 4.0–10.5)
nRBC: 0.2 % (ref 0.0–0.2)

## 2018-08-26 LAB — BASIC METABOLIC PANEL
Anion gap: 9 (ref 5–15)
BUN: 16 mg/dL (ref 8–23)
CO2: 21 mmol/L — ABNORMAL LOW (ref 22–32)
Calcium: 8.5 mg/dL — ABNORMAL LOW (ref 8.9–10.3)
Chloride: 118 mmol/L — ABNORMAL HIGH (ref 98–111)
Creatinine, Ser: 0.85 mg/dL (ref 0.61–1.24)
GFR calc Af Amer: >60 mL/min (ref >60)
GFR calc non Af Amer: >60 mL/min (ref >60)
Glucose, Bld: 191 mg/dL — ABNORMAL HIGH (ref 70–99)
Potassium: 3.4 mmol/L — ABNORMAL LOW (ref 3.5–5.1)
Sodium: 148 mmol/L — ABNORMAL HIGH (ref 135–145)

## 2018-08-26 LAB — AMMONIA: Ammonia: 43 umol/L — ABNORMAL HIGH (ref 9–35)

## 2018-08-26 LAB — VITAMIN B12: Vitamin B-12: 670 pg/mL (ref 180–914)

## 2018-08-26 LAB — TSH: TSH: 3.346 u[IU]/mL (ref 0.350–4.500)

## 2018-08-26 MED ORDER — METOPROLOL TARTRATE 25 MG PO TABS
25.0000 mg | ORAL_TABLET | Freq: Once | ORAL | Status: AC
  Administered 2018-08-26: 25 mg via ORAL
  Filled 2018-08-26: qty 1

## 2018-08-26 MED ORDER — METOPROLOL TARTRATE 50 MG PO TABS
50.0000 mg | ORAL_TABLET | Freq: Two times a day (BID) | ORAL | Status: DC
  Administered 2018-08-26 – 2018-08-30 (×8): 50 mg via ORAL
  Filled 2018-08-26 (×8): qty 1

## 2018-08-26 MED ORDER — INSULIN ASPART 100 UNIT/ML ~~LOC~~ SOLN
0.0000 [IU] | Freq: Every day | SUBCUTANEOUS | Status: DC
  Administered 2018-08-29: 3 [IU] via SUBCUTANEOUS

## 2018-08-26 MED ORDER — INSULIN DETEMIR 100 UNIT/ML ~~LOC~~ SOLN
15.0000 [IU] | Freq: Two times a day (BID) | SUBCUTANEOUS | Status: DC
  Administered 2018-08-26 – 2018-08-29 (×6): 15 [IU] via SUBCUTANEOUS
  Filled 2018-08-26 (×7): qty 0.15

## 2018-08-26 MED ORDER — POTASSIUM CHLORIDE CRYS ER 20 MEQ PO TBCR
20.0000 meq | EXTENDED_RELEASE_TABLET | Freq: Once | ORAL | Status: AC
  Administered 2018-08-26: 20 meq via ORAL
  Filled 2018-08-26: qty 1

## 2018-08-26 MED ORDER — POTASSIUM CHLORIDE CRYS ER 20 MEQ PO TBCR
40.0000 meq | EXTENDED_RELEASE_TABLET | Freq: Once | ORAL | Status: AC
  Administered 2018-08-26: 40 meq via ORAL
  Filled 2018-08-26: qty 2

## 2018-08-26 MED ORDER — HYDRALAZINE HCL 20 MG/ML IJ SOLN: 10.0000 mg | INTRAMUSCULAR | Status: DC | PRN

## 2018-08-26 MED ORDER — INSULIN ASPART 100 UNIT/ML ~~LOC~~ SOLN
0.0000 [IU] | Freq: Three times a day (TID) | SUBCUTANEOUS | Status: DC
  Administered 2018-08-26 (×2): 5 [IU] via SUBCUTANEOUS
  Administered 2018-08-27: 3 [IU] via SUBCUTANEOUS
  Administered 2018-08-27: 07:00:00 2 [IU] via SUBCUTANEOUS
  Administered 2018-08-27: 12:00:00 5 [IU] via SUBCUTANEOUS
  Administered 2018-08-28: 06:00:00 2 [IU] via SUBCUTANEOUS
  Administered 2018-08-28: 3 [IU] via SUBCUTANEOUS
  Administered 2018-08-28: 5 [IU] via SUBCUTANEOUS
  Administered 2018-08-29: 2 [IU] via SUBCUTANEOUS
  Administered 2018-08-30: 5 [IU] via SUBCUTANEOUS
  Administered 2018-08-30: 1 [IU] via SUBCUTANEOUS

## 2018-08-26 NOTE — Progress Notes (Signed)
[facility] Progress Note  Patient: [name] [name] YQI:[id number]   PCP: Chipper Herb, MD DOB: Sep 29, 1940   DOA: 08/17/2018   DOS: 08/26/2018   Date of Service: the patient was seen and examined on 08/26/2018  Brief hospital course: Pt. with PMH of diabetes; admitted on 08/17/2018, presented with complaint of cardiac arrest, was found to have V. fib arrest.  Bystander CPR.  ACLS chloride patient actually required 2 shocks for V. fib arrest.  Brought to the emergency department in comatose condition.  Underwent therapeutic hypothermia protocol.Cardiology was consulted and S/P cardiac catheterization Currently further plan is treatment of pneumonia.  Subjective: Somewhat confused.  Agitated.  No nausea or vomiting.  No fever no chills.  Blood pressure and heart rate elevated  Assessment and Plan: Status-post cardiac arrest V. Fib cardiac arrest Likely preceded by hypoglycemia however initial rhythm VF  Peak troponin 2.77. S/p hypothermia protocol on 4/30. TTE with normal EF. - Cardiology consultation appreciated.   Acute hypoxic respiratory failure. Hafnia Alvei pneumonia Sensitive to Cefepime, Cipro and Bactrim - F/u blood cultures - Continue Cefepime. Plan for 7 day course (end date 5/12) - Patient was on ventilator.  Now extubated. - Still on 4 L of oxygen.  Continue incentive spirometry and flutter valve as much as possible.  Coronary artery disease S/P PCI Severe proximal LAD stenosis, treated successfully with PCI using a 3.0 x 20 mm Synergy DES Severe mid circumflex stenosis, treated successfully with a 2.25 x 12 mm Synergy DES Moderate distal RCA stenosis, nonobstructive Plan is aspirin for 30 days only and Plavix for at least 6 months  Heart failure with preserved ejection fraction No diuresis for now patient is -1.6 L in last 24 hours.  pAFRVR - Transition to oral amiodarone. - Initially treated with heparin infusion.  Now on Eliquis.  Hypertension-uncontrolled -  Amlodipine, patient was also on metoprolol 25 mg 3 times daily.  I will increase the dose to 50 mg twice daily. - PRN hydralazine  Hypokalemia - Continue to replete  Type 2 diabetes mellitus, uncontrolled with hyperglycemia, no complication. - CBGs AC at bedtime with SSI.  Also Levemir dose increased to 15 units twice daily. - Off Insulin gtt Check hemoglobin A1c tomorrow.  Nutritional Support - Swallow evaluation recommends dysphagia 2 diet nectar thick liquid.  Acute encephalopathy. Will use Ativan. QT prolonged cannot use Haldol.  Diet: Dysphagia 2 diet nectar thick liquid DVT Prophylaxis: subcutaneous Heparin  Advance goals of care discussion: Full code  Family Communication: no family was present at bedside, at the time of interview.  Discussed with patient's son  Opportunity was given to ask question and all questions were answered satisfactorily.   Disposition:  Discharge to CIR.  Consultants: Cardiology, IP rehab Procedures: Cardiac cath  Scheduled Meds: . amiodarone  400 mg Oral BID   Followed by  . [START ON 08/31/2018] amiodarone  200 mg Oral Daily  . amLODipine  10 mg Oral Daily  . apixaban  5 mg Oral BID  . aspirin EC  81 mg Oral Daily  . atorvastatin  80 mg Oral q1800  . clopidogrel  75 mg Oral Q breakfast  . insulin aspart  0-5 Units Subcutaneous QHS  . insulin aspart  0-9 Units Subcutaneous TID WC  . insulin detemir  15 Units Subcutaneous BID  . levothyroxine  75 mcg Oral Daily  . metoprolol tartrate  50 mg Oral BID  . sodium chloride flush  3 mL Intravenous Q12H   Continuous Infusions: .  sodium chloride 10 mL/hr at 08/24/18 1100  . sodium chloride    . ceFEPime (MAXIPIME) IV 2 g (08/26/18 0919)   PRN Meds: sodium chloride, acetaminophen, fluticasone, hydrALAZINE, ipratropium-albuterol, LORazepam, Melatonin, ondansetron (ZOFRAN) IV, Resource ThickenUp Clear, sodium chloride flush Antibiotics: Anti-infectives (From admission, onward)   Start      Dose/Rate Route Frequency Ordered Stop   08/22/18 1000  ceFEPIme (MAXIPIME) 2 g in sodium chloride 0.9 % 100 mL IVPB     2 g 200 mL/hr over 30 Minutes Intravenous Every 12 hours 08/22/18 0828 08/29/18 0959   08/21/18 0600  vancomycin (VANCOCIN) IVPB 750 mg/150 ml premix  Status:  Discontinued     750 mg 150 mL/hr over 60 Minutes Intravenous Every 12 hours 08/20/18 1636 08/22/18 0817   08/20/18 1700  vancomycin (VANCOCIN) IVPB 1000 mg/200 mL premix     1,000 mg 200 mL/hr over 60 Minutes Intravenous  Once 08/20/18 1636 08/20/18 1928   08/20/18 0900  piperacillin-tazobactam (ZOSYN) IVPB 3.375 g  Status:  Discontinued     3.375 g 12.5 mL/hr over 240 Minutes Intravenous Every 8 hours 08/20/18 0753 08/22/18 0819   08/18/18 1800  azithromycin (ZITHROMAX) 500 mg in sodium chloride 0.9 % 250 mL IVPB  Status:  Discontinued     500 mg 250 mL/hr over 60 Minutes Intravenous Every 24 hours 08/17/18 1756 08/18/18 0823   08/18/18 1800  cefTRIAXone (ROCEPHIN) 1 g in sodium chloride 0.9 % 100 mL IVPB  Status:  Discontinued     1 g 200 mL/hr over 30 Minutes Intravenous Every 24 hours 08/17/18 1756 08/18/18 0823   08/18/18 0930  Ampicillin-Sulbactam (UNASYN) 3 g in sodium chloride 0.9 % 100 mL IVPB     3 g 200 mL/hr over 30 Minutes Intravenous Every 8 hours 08/18/18 0823 08/20/18 0116   08/17/18 2000  cefTRIAXone (ROCEPHIN) 1 g in sodium chloride 0.9 % 100 mL IVPB     1 g 200 mL/hr over 30 Minutes Intravenous  Once 08/17/18 1932 08/17/18 2258   08/17/18 2000  azithromycin (ZITHROMAX) 500 mg in sodium chloride 0.9 % 250 mL IVPB     500 mg 250 mL/hr over 60 Minutes Intravenous  Once 08/17/18 1932 08/18/18 0000   08/17/18 1730  cefTRIAXone (ROCEPHIN) 1 g in sodium chloride 0.9 % 100 mL IVPB  Status:  Discontinued     1 g 200 mL/hr over 30 Minutes Intravenous  Once 08/17/18 1723 08/17/18 1931   08/17/18 1730  azithromycin (ZITHROMAX) 500 mg in sodium chloride 0.9 % 250 mL IVPB  Status:  Discontinued     500  mg 250 mL/hr over 60 Minutes Intravenous  Once 08/17/18 1723 08/17/18 1932       Objective: Physical Exam: Vitals:   08/26/18 1155 08/26/18 1303 08/26/18 1400 08/26/18 1456  BP: 131/63  99/78 132/68  Pulse: 87  98 97  Resp: 20  (!) 26 (!) 25  Temp: 98 F (36.7 C) 97.8 F (36.6 C)  99 F (37.2 C)  TempSrc: Oral Oral  Oral  SpO2: 92%  90% 90%  Weight:      Height:        Intake/Output Summary (Last 24 hours) at 08/26/2018 1621 Last data filed at 08/26/2018 1300 Gross per 24 hour  Intake 1088.09 ml  Output 1800 ml  Net -711.91 ml   Filed Weights   08/22/18 0500 08/23/18 0500 08/24/18 0500  Weight: 78.6 kg 79.1 kg 77 kg   General: Alert, Awake and Oriented to  Time, Place and Person. Appear in moderate distress, affect irritable Eyes: PERRL, Conjunctiva normal ENT: Oral Mucosa clear moist. Neck: difficult to assess  JVD, no Abnormal Mass Or lumps Cardiovascular: S1 and S2 Present, no Murmur, Peripheral Pulses Present Respiratory: normal respiratory effort, Bilateral Air entry equal and Decreased, no use of accessory muscle, bilateral  Crackles, no wheezes Abdomen: Bowel Sound present, Soft and no tenderness, no hernia Skin: no redness, no Rash, no induration Extremities: no Pedal edema, no calf tenderness Neurologic: Grossly no focal neuro deficit. Bilaterally Equal motor strength  Data Reviewed: CBC: Recent Labs  Lab 08/22/18 0354 08/23/18 0430 08/24/18 0438 08/25/18 0400 08/26/18 0219  WBC 9.4 8.0 8.6 8.7 10.7*  HGB 9.5* 9.2* 10.1* 10.1* 9.8*  HCT 30.1* 29.0* 31.7* 31.5* 31.0*  MCV 83.8 84.1 83.4 83.3 83.8  PLT 203 186 232 226 412   Basic Metabolic Panel: Recent Labs  Lab 08/19/18 1803  08/23/18 1240 08/23/18 2119 08/24/18 0438 08/25/18 0645 08/25/18 1155 08/26/18 0219  NA  --    < > 143  --  146* 146* 148* 148*  K  --    < > 3.0*  --  3.0* 2.8* 3.3* 3.4*  CL  --    < > 113*  --  114* 113* 116* 118*  CO2  --    < > 20*  --  23 25 22  21*  GLUCOSE  --     < > 163*  --  180* 212* 184* 191*  BUN  --    < > 29*  --  22 17 17 16   CREATININE  --    < > 1.03  --  0.90 0.81 0.87 0.85  CALCIUM  --    < > 8.6*  --  8.6* 8.4* 8.5* 8.5*  MG 2.1  --   --  1.9  --   --  1.8  --   PHOS 4.2  --   --   --   --   --   --   --    < > = values in this interval not displayed.    Liver Function Tests: No results for input(s): AST, ALT, ALKPHOS, BILITOT, PROT, ALBUMIN in the last 168 hours. No results for input(s): LIPASE, AMYLASE in the last 168 hours. Recent Labs  Lab 08/26/18 1031  AMMONIA 43*   Coagulation Profile: No results for input(s): INR, PROTIME in the last 168 hours. Cardiac Enzymes: No results for input(s): CKTOTAL, CKMB, CKMBINDEX, TROPONINI in the last 168 hours. BNP (last 3 results) No results for input(s): PROBNP in the last 8760 hours. CBG: Recent Labs  Lab 08/25/18 1124 08/25/18 2058 08/26/18 0049 08/26/18 0356 08/26/18 1611  GLUCAP 167* 202* 181* 152* 253*   Studies: No results found.   Time spent: 35 minutes  Author: Berle Mull, MD [facility] 08/26/2018 4:21 PM  To reach On-call, see care teams to locate the attending and reach out to them via [email or web address]. If 7PM-7AM, please contact night-coverage If you still have difficulty reaching the attending provider, please page the [facility] (Director on Call) for [facility] on amion for assistance.

## 2018-08-26 NOTE — Progress Notes (Signed)
Physical Therapy Evaluation Patient Details Name: [name] [name] MRN: [id number] DOB: July 19, 1940 Today's Date: 08/26/2018   History of Present Illness  Pt is a 78 year old male who presented to the ED on 4/30 after a witnessed cardiac arrest. Immediate bystander CPR initiated. Pt found to be in V-fib on EMS arrival and was given 1 round of Epi and 2 shocks. ROSC achieved after ~11 minutes of CPR. Pt intubated in the field; extubated 5/6. CT Head negative for acute changes. PMH of T2DM, HH, GERD.  Clinical Impression  Pt admitted with/for cardiac arrest with pulmonary complications.  Pt presently delirious, needing mod to max assist from most mobility.  Pt currently limited functionally due to the problems listed. ( See problems list.)   Pt will benefit from PT to maximize function and safety in order to get ready for next venue listed below.     Follow Up Recommendations CIR;Supervision/Assistance - 24 hour    Equipment Recommendations  Other (comment)(TBA)    Recommendations for Other Services Rehab consult     Precautions / Restrictions Precautions Precautions: Fall Precaution Comments: sitter for delerium as of eval 5/9      Mobility  Bed Mobility Overal bed mobility: Needs Assistance Bed Mobility: Supine to Sit;Sit to Supine     Supine to sit: Mod assist;Max assist Sit to supine: Mod assist;Max assist   General bed mobility comments: assist based on how much pt can focus on task.  Transfers Overall transfer level: Needs assistance   Transfers: Sit to/from Stand;Lateral/Scoot Transfers Sit to Stand: Max assist        Lateral/Scoot Transfers: Max assist General transfer comment: cues for hand placement, assist for more boost than assisted forward.  Max redirection to task.  Ambulation/Gait             General Gait Details: pt unable whether weakness or focus  Stairs            Wheelchair Mobility    Modified Rankin (Stroke Patients Only)        Balance Overall balance assessment: Needs assistance Sitting-balance support: Single extremity supported;Bilateral upper extremity supported Sitting balance-Leahy Scale: Poor Sitting balance - Comments: bias posteriorly   Standing balance support: Bilateral upper extremity supported Standing balance-Leahy Scale: Poor Standing balance comment: sit to stand x2 and significant assist to help attain full upright stance.                             Pertinent Vitals/Pain Pain Assessment: Faces Faces Pain Scale: No hurt Pain Intervention(s): Monitored during session    Home Living Family/patient expects to be discharged to:: Private residence Living Arrangements: Spouse/significant other Available Help at Discharge: Family Type of Home: House                Prior Function Level of Independence: Independent               Hand Dominance        Extremity/Trunk Assessment   Upper Extremity Assessment Upper Extremity Assessment: (generally function UE strength)    Lower Extremity Assessment Lower Extremity Assessment: RLE deficits/detail;LLE deficits/detail RLE Deficits / Details: moves spontaneously against gravity.  No MMT due to pt unable to follow direction.  Pt able to bear weight in standing RLE Coordination: decreased fine motor LLE Deficits / Details: moves spontaneously against gravity.  no MMT due to unable to follow direction. LLE Coordination: decreased fine motor  Communication   Communication: No difficulties  Cognition Arousal/Alertness: Awake/alert Behavior During Therapy: Restless Overall Cognitive Status: Impaired/Different from baseline Area of Impairment: Orientation;Attention;Following commands;Safety/judgement;Awareness;Problem solving                 Orientation Level: Place;Time;Situation Current Attention Level: Focused   Following Commands: Follows one step commands inconsistently Safety/Judgement: Decreased  awareness of safety;Decreased awareness of deficits Awareness: Intellectual Problem Solving: Slow processing;Difficulty sequencing;Requires verbal cues;Requires tactile cues General Comments: delerious with sitter      General Comments General comments (skin integrity, edema, etc.): SpO2 on 6 L during standing and general activity maintained at 92%,  EHR in the range of 100-105 bpm, dyspnea 2/4, rr 30.  Further activity aborted and pt returned to supine..  Left O2 on 6 L due to 4L dropped to 88% initially    Exercises     Assessment/Plan    PT Assessment Patient needs continued PT services  PT Problem List Decreased strength;Decreased activity tolerance;Decreased balance;Decreased mobility;Decreased knowledge of use of DME;Cardiopulmonary status limiting activity       PT Treatment Interventions DME instruction;Gait training;Functional mobility training;Therapeutic activities;Balance training;Patient/family education    PT Goals (Current goals can be found in the Care Plan section)  Acute Rehab PT Goals Patient Stated Goal: pt unable to participate PT Goal Formulation: Patient unable to participate in goal setting Time For Goal Achievement: 09/09/18 Potential to Achieve Goals: Good    Frequency Min 3X/week   Barriers to discharge        Co-evaluation               AM-PAC PT "6 Clicks" Mobility  Outcome Measure Help needed turning from your back to your side while in a flat bed without using bedrails?: A Lot Help needed moving from lying on your back to sitting on the side of a flat bed without using bedrails?: A Lot Help needed moving to and from a bed to a chair (including a wheelchair)?: Total Help needed standing up from a chair using your arms (e.g., wheelchair or bedside chair)?: Total Help needed to walk in hospital room?: Total Help needed climbing 3-5 steps with a railing? : Total 6 Click Score: 8    End of Session   Activity Tolerance: Patient limited by  fatigue Patient left: in bed;with call bell/phone within reach;with nursing/sitter in room;with bed alarm set Nurse Communication: Mobility status PT Visit Diagnosis: Other abnormalities of gait and mobility (R26.89);Muscle weakness (generalized) (M62.81);Difficulty in walking, not elsewhere classified (R26.2)    Time: 1431-1450 PT Time Calculation (min) (ACUTE ONLY): 19 min   Charges:   PT Evaluation $PT Eval Moderate Complexity: 1 Mod          08/26/2018  Donnella Sham, PT Acute Rehabilitation Services [phone]  (pager) [phone]  (office)  Tessie Fass Damariz Paganelli 08/26/2018, 3:09 PM

## 2018-08-26 NOTE — Progress Notes (Signed)
Pt is confused whole day and talking all the time, PT/OT worked with him, pt was not stable on his feet to walk but he was dangled and sit at the edge of the bed for a while Oxygen is weaned to Room air at 4pm, it is 92% in RA, tolerating meds and diet, wife is called and updated, will continue to monitor the patient  Palma Holter, Therapist, sports

## 2018-08-26 NOTE — Progress Notes (Signed)
Progress Note  Patient Name: [name] [name] Date of Encounter: 08/26/2018  Primary Cardiologist:   No primary care provider on file.   Subjective   Very confused.  Denies pain  Inpatient Medications    Scheduled Meds: . amiodarone  400 mg Oral BID   Followed by  . [START ON 08/31/2018] amiodarone  200 mg Oral Daily  . amLODipine  10 mg Oral Daily  . apixaban  5 mg Oral BID  . aspirin EC  81 mg Oral Daily  . atorvastatin  80 mg Oral q1800  . clopidogrel  75 mg Oral Q breakfast  . insulin aspart  0-5 Units Subcutaneous QHS  . insulin aspart  0-9 Units Subcutaneous TID WC  . insulin detemir  15 Units Subcutaneous BID  . levothyroxine  75 mcg Oral Daily  . metoprolol tartrate  25 mg Oral Once  . metoprolol tartrate  50 mg Oral BID  . sodium chloride flush  3 mL Intravenous Q12H   Continuous Infusions: . sodium chloride 10 mL/hr at 08/24/18 1100  . sodium chloride    . ceFEPime (MAXIPIME) IV Stopped (08/25/18 2328)   PRN Meds: sodium chloride, acetaminophen, fluticasone, hydrALAZINE, ipratropium-albuterol, LORazepam, Melatonin, ondansetron (ZOFRAN) IV, Resource ThickenUp Clear, sodium chloride flush   Vital Signs    Vitals:   08/26/18 0047 08/26/18 0400 08/26/18 0700 08/26/18 0800  BP: 100/63 (!) 135/56 (!) 165/76   Pulse: 92 96 99   Resp: 20 (!) 21 18   Temp: 98.2 F (36.8 C) 97.7 F (36.5 C) 99.1 F (37.3 C) 98.4 F (36.9 C)  TempSrc: Oral Oral Axillary Oral  SpO2: 91% 92% 93%   Weight:      Height:        Intake/Output Summary (Last 24 hours) at 08/26/2018 0916 Last data filed at 08/26/2018 0654 Gross per 24 hour  Intake 1088.09 ml  Output 2200 ml  Net -1111.91 ml   Filed Weights   08/22/18 0500 08/23/18 0500 08/24/18 0500  Weight: 78.6 kg 79.1 kg 77 kg    Telemetry    Sinus tach - Personally Reviewed  ECG    NA - Personally Reviewed  Physical Exam   GEN:   Chronically ill appearing Neck: No  JVD Cardiac: RRR, NO murmurs, rubs, or gallops.   Respiratory:    Decreased breath sounds GI: Soft, nontender, non-distended  MS: No  edema; No deformity. Neuro:  Nonfocal  Psych:   Very confused  Labs    Chemistry Recent Labs  Lab 08/25/18 0645 08/25/18 1155 08/26/18 0219  NA 146* 148* 148*  K 2.8* 3.3* 3.4*  CL 113* 116* 118*  CO2 25 22 21*  GLUCOSE 212* 184* 191*  BUN 17 17 16   CREATININE 0.81 0.87 0.85  CALCIUM 8.4* 8.5* 8.5*  GFRNONAA >60 >60 >60  GFRAA >60 >60 >60  ANIONGAP 8 10 9      Hematology Recent Labs  Lab 08/24/18 0438 08/25/18 0400 08/26/18 0219  WBC 8.6 8.7 10.7*  RBC 3.80* 3.78* 3.70*  HGB 10.1* 10.1* 9.8*  HCT 31.7* 31.5* 31.0*  MCV 83.4 83.3 83.8  MCH 26.6 26.7 26.5  MCHC 31.9 32.1 31.6  RDW 14.8 15.1 15.3  PLT 232 226 236    Cardiac EnzymesNo results for input(s): TROPONINI in the last 168 hours. No results for input(s): TROPIPOC in the last 168 hours.   BNPNo results for input(s): BNP, PROBNP in the last 168 hours.   DDimer No results for input(s): DDIMER in  the last 168 hours.   Radiology    Dg Chest Port 1 View  Result Date: 08/25/2018 CLINICAL DATA:  Patient admitted 08/17/2018 with respiratory failure and acute encephalopathy. EXAM: PORTABLE CHEST 1 VIEW COMPARISON:  Single-view of the chest 08/18/2018 and 08/19/2018. FINDINGS: ETT and NG tube have been removed. Right PICC remains in place. Lung volumes are low. Bilateral airspace disease has worsened. Lung volumes are low. Heart size is normal. No pneumothorax. Small left pleural effusion. IMPRESSION: Status post extubation and NG tube removal. Increased basilar airspace opacities could be due to worsened atelectasis although progressive pneumonia and/or edema could create a similar appearance. Electronically Signed   By: Inge Rise M.D.   On: 08/25/2018 08:47    Cardiac Studies    CARDIAC CATH:     Prox Cx to Mid Cx lesion is 80% stenosed.  Prox LAD lesion is 95% stenosed.  A drug-eluting stent was successfully placed  using a STENT SYNERGY DES 3X20.  Post intervention, there is a 0% residual stenosis.  A drug-eluting stent was successfully placed using a STENT SYNERGY DES 2.25X12.  Post intervention, there is a 0% residual stenosis.  Dist RCA lesion is 50% stenosed.   1.  Severe proximal LAD stenosis, treated successfully with PCI using a 3.0 x 20 mm Synergy DES 2.  Severe mid circumflex stenosis, treated successfully with a 2.25 x 12 mm Synergy DES 3.  Moderate distal RCA stenosis, nonobstructive 4.  Normal LV function by echo  Recommendations: Patient with paroxysmal atrial fibrillation.  Recommend start oral anticoagulation with apixaban tomorrow morning.  Aspirin x30 days only.  Clopidogrel times at least 6 months.  Cangrelor is used in the cardiac catheterization lab for IV antiplatelet therapy.  The infusion will be continued for a total of 2 hours, then the patient will be loaded with clopidogrel 600 mg per protocol.  Patient Profile     78 y.o. male with type 2 diabetes, hypertension, and mixed hyperlipidemia, admitted 08/17/2018 with out of hospital ventricular fibrillation arrest  [facility] Ventricular Fibrillation Arrest:    Normal EF.  Likely secondary to acute ischemia.    Elevated Troponin:    CAD as above. Status post intervention.  Plan to start Eliquis this morning.    Atrial Fibrillation w/ RVR:     On Eliquis and triple therapy with plan as outlined at the time of the cath.   Acute Hypoxemic Respiratory Failure:     Resolved and extubated.     AKI:     Resolved.    Hypokalemia:      Will give an extra 20 meq PO   HTN:    Due to get a higher dose of beta blocker today.  Will add ARB or increase beta blocker tomorrow pending the results.   T2DM:      Per primary team.    Acute Diastolic HF:   Euvolemic  Continue current therapy.   For questions or updates, please contact [place] Please consult [email or web address] for contact info under  Cardiology/STEMI.   Signed, Minus Breeding, MD  08/26/2018, 9:16 AM

## 2018-08-26 NOTE — Progress Notes (Signed)
Cardiac Rehab Advisory Cardiac Rehab Phase I is not seeing pts face to face at this time due to Covid 19 restrictions. Ambulation is occurring through nursing, PT, and mobility teams. We will help facilitate that process as needed. We are calling pts in their rooms and discussing education. We will then deliver education materials to pts RN for delivery to pt.   Pt confused per RN. Has sitter. Left education materials (MI book stent card, diets) with sitter for when pt is more aware. Will f/u Monday for education.  [place], ACSM 11:59 AM 08/26/2018

## 2018-08-26 NOTE — Progress Notes (Signed)
Rehab Admissions Coordinator Note:  Per PT recommendation, this patient was screened by Jhonnie Garner for appropriateness for an Inpatient Acute Rehab Consult.  At this time, we are recommending an Inpatient Rehab consult. AC will contact MD to request order.    Jhonnie Garner 08/26/2018, 3:30 PM  I can be reached at [phone].

## 2018-08-27 LAB — CBC
HCT: 29.8 % — ABNORMAL LOW (ref 39.0–52.0)
Hemoglobin: 9.3 g/dL — ABNORMAL LOW (ref 13.0–17.0)
MCH: 26.4 pg (ref 26.0–34.0)
MCHC: 31.2 g/dL (ref 30.0–36.0)
MCV: 84.7 fL (ref 80.0–100.0)
Platelets: 245 10*3/uL (ref 150–400)
RBC: 3.52 MIL/uL — ABNORMAL LOW (ref 4.22–5.81)
RDW: 15.2 % (ref 11.5–15.5)
WBC: 9.5 10*3/uL (ref 4.0–10.5)
nRBC: 0 % (ref 0.0–0.2)

## 2018-08-27 LAB — HEMOGLOBIN A1C
Hgb A1c MFr Bld: 8.7 % — ABNORMAL HIGH (ref 4.8–5.6)
Mean Plasma Glucose: 202.99 mg/dL

## 2018-08-27 LAB — SARS CORONAVIRUS 2 BY RT PCR (HOSPITAL ORDER, PERFORMED IN ~~LOC~~ HOSPITAL LAB): SARS Coronavirus 2: NEGATIVE

## 2018-08-27 LAB — BASIC METABOLIC PANEL
Anion gap: 9 (ref 5–15)
BUN: 19 mg/dL (ref 8–23)
CO2: 21 mmol/L — ABNORMAL LOW (ref 22–32)
Calcium: 8.5 mg/dL — ABNORMAL LOW (ref 8.9–10.3)
Chloride: 116 mmol/L — ABNORMAL HIGH (ref 98–111)
Creatinine, Ser: 0.83 mg/dL (ref 0.61–1.24)
GFR calc Af Amer: >60 mL/min (ref >60)
GFR calc non Af Amer: >60 mL/min (ref >60)
Glucose, Bld: 217 mg/dL — ABNORMAL HIGH (ref 70–99)
Potassium: 3.4 mmol/L — ABNORMAL LOW (ref 3.5–5.1)
Sodium: 146 mmol/L — ABNORMAL HIGH (ref 135–145)

## 2018-08-27 LAB — GLUCOSE, CAPILLARY
Glucose-Capillary: 180 mg/dL — ABNORMAL HIGH (ref 70–99)
Glucose-Capillary: 271 mg/dL — ABNORMAL HIGH (ref 70–99)
Glucose-Capillary: 206 mg/dL — ABNORMAL HIGH (ref 70–99)

## 2018-08-27 LAB — MAGNESIUM: Magnesium: 2 mg/dL (ref 1.7–2.4)

## 2018-08-27 MED ORDER — POTASSIUM CHLORIDE CRYS ER 20 MEQ PO TBCR
20.0000 meq | EXTENDED_RELEASE_TABLET | Freq: Once | ORAL | Status: AC
  Administered 2018-08-27: 20 meq via ORAL

## 2018-08-27 MED ORDER — POTASSIUM CHLORIDE CRYS ER 20 MEQ PO TBCR
60.0000 meq | EXTENDED_RELEASE_TABLET | Freq: Every day | ORAL | Status: DC
  Administered 2018-08-28: 60 meq via ORAL
  Filled 2018-08-27: qty 3

## 2018-08-27 MED ORDER — ORAL CARE MOUTH RINSE
15.0000 mL | Freq: Two times a day (BID) | OROMUCOSAL | Status: DC
  Administered 2018-08-28 – 2018-08-30 (×4): 15 mL via OROMUCOSAL

## 2018-08-27 MED ORDER — IRBESARTAN 150 MG PO TABS
75.0000 mg | ORAL_TABLET | Freq: Every day | ORAL | Status: DC
  Administered 2018-08-27 – 2018-08-30 (×4): 75 mg via ORAL
  Filled 2018-08-27 (×4): qty 1

## 2018-08-27 MED ORDER — RESOURCE THICKENUP CLEAR PO POWD
ORAL | Status: DC | PRN
  Filled 2018-08-27: qty 125

## 2018-08-27 MED ORDER — POTASSIUM CHLORIDE CRYS ER 20 MEQ PO TBCR
40.0000 meq | EXTENDED_RELEASE_TABLET | Freq: Once | ORAL | Status: AC
  Administered 2018-08-27: 40 meq via ORAL
  Filled 2018-08-27: qty 2

## 2018-08-27 MED ORDER — QUETIAPINE FUMARATE 50 MG PO TABS
25.0000 mg | ORAL_TABLET | Freq: Every day | ORAL | Status: DC
  Administered 2018-08-27 – 2018-08-29 (×3): 25 mg via ORAL
  Filled 2018-08-27 (×3): qty 1

## 2018-08-27 MED ORDER — SENNOSIDES-DOCUSATE SODIUM 8.6-50 MG PO TABS
1.0000 | ORAL_TABLET | Freq: Two times a day (BID) | ORAL | Status: DC
  Administered 2018-08-27 – 2018-08-30 (×7): 1 via ORAL
  Filled 2018-08-27 (×7): qty 1

## 2018-08-27 NOTE — Progress Notes (Signed)
Progress Note  Patient Name: ACEL NATZKE Date of Encounter: 08/27/2018  Primary Cardiologist:   No primary care provider on file.   Subjective   Very confused.  No acute complaints  Inpatient Medications    Scheduled Meds: . amiodarone  400 mg Oral BID   Followed by  . [START ON 08/31/2018] amiodarone  200 mg Oral Daily  . amLODipine  10 mg Oral Daily  . apixaban  5 mg Oral BID  . aspirin EC  81 mg Oral Daily  . atorvastatin  80 mg Oral q1800  . clopidogrel  75 mg Oral Q breakfast  . insulin aspart  0-5 Units Subcutaneous QHS  . insulin aspart  0-9 Units Subcutaneous TID WC  . insulin detemir  15 Units Subcutaneous BID  . levothyroxine  75 mcg Oral Daily  . metoprolol tartrate  50 mg Oral BID  . potassium chloride  40 mEq Oral Once  . QUEtiapine  25 mg Oral QHS  . senna-docusate  1 tablet Oral BID  . sodium chloride flush  3 mL Intravenous Q12H   Continuous Infusions: . sodium chloride 10 mL/hr at 08/24/18 1100  . sodium chloride 10 mL/hr at 08/27/18 0600  . ceFEPime (MAXIPIME) IV 2 g (08/27/18 0847)   PRN Meds: sodium chloride, acetaminophen, fluticasone, hydrALAZINE, ipratropium-albuterol, LORazepam, Melatonin, ondansetron (ZOFRAN) IV, Resource ThickenUp Clear, sodium chloride flush   Vital Signs    Vitals:   08/27/18 0000 08/27/18 0005 08/27/18 0400 08/27/18 1115  BP: (!) 143/74  (!) 142/55 (!) 157/57  Pulse: 77  89 83  Resp: (!) 26  (!) 27 (!) 21  Temp:  98.7 F (37.1 C) 98.4 F (36.9 C) 97.8 F (36.6 C)  TempSrc:  Axillary Axillary Oral  SpO2: 92%  91% 90%  Weight:      Height:        Intake/Output Summary (Last 24 hours) at 08/27/2018 1134 Last data filed at 08/27/2018 1119 Gross per 24 hour  Intake 907.87 ml  Output 1900 ml  Net -992.13 ml   Filed Weights   08/22/18 0500 08/23/18 0500 08/24/18 0500  Weight: 78.6 kg 79.1 kg 77 kg    Telemetry    Sinus - Personally Reviewed  ECG    NA - Personally Reviewed  Physical Exam   GEN:  No  acute distress.   Neck: No  JVD Cardiac: RRR, no murmurs, rubs, or gallops.  Respiratory: Clear   to auscultation bilaterally. GI: Soft, nontender, non-distended, normal bowel sounds  MS:  No edema; No deformity. Neuro:   Nonfocal Psych: Confused  Labs    Chemistry Recent Labs  Lab 08/25/18 1155 08/26/18 0219 08/27/18 0350  NA 148* 148* 146*  K 3.3* 3.4* 3.4*  CL 116* 118* 116*  CO2 22 21* 21*  GLUCOSE 184* 191* 217*  BUN 17 16 19   CREATININE 0.87 0.85 0.83  CALCIUM 8.5* 8.5* 8.5*  GFRNONAA >60 >60 >60  GFRAA >60 >60 >60  ANIONGAP 10 9 9      Hematology Recent Labs  Lab 08/25/18 0400 08/26/18 0219 08/27/18 0350  WBC 8.7 10.7* 9.5  RBC 3.78* 3.70* 3.52*  HGB 10.1* 9.8* 9.3*  HCT 31.5* 31.0* 29.8*  MCV 83.3 83.8 84.7  MCH 26.7 26.5 26.4  MCHC 32.1 31.6 31.2  RDW 15.1 15.3 15.2  PLT 226 236 245    Cardiac EnzymesNo results for input(s): TROPONINI in the last 168 hours. No results for input(s): TROPIPOC in the last 168 hours.  BNPNo results for input(s): BNP, PROBNP in the last 168 hours.   DDimer No results for input(s): DDIMER in the last 168 hours.   Radiology    No results found.  Cardiac Studies    CARDIAC CATH:     Prox Cx to Mid Cx lesion is 80% stenosed.  Prox LAD lesion is 95% stenosed.  A drug-eluting stent was successfully placed using a STENT SYNERGY DES 3X20.  Post intervention, there is a 0% residual stenosis.  A drug-eluting stent was successfully placed using a STENT SYNERGY DES 2.25X12.  Post intervention, there is a 0% residual stenosis.  Dist RCA lesion is 50% stenosed.   1.  Severe proximal LAD stenosis, treated successfully with PCI using a 3.0 x 20 mm Synergy DES 2.  Severe mid circumflex stenosis, treated successfully with a 2.25 x 12 mm Synergy DES 3.  Moderate distal RCA stenosis, nonobstructive 4.  Normal LV function by echo  Recommendations: Patient with paroxysmal atrial fibrillation.  Recommend start oral  anticoagulation with apixaban tomorrow morning.  Aspirin x30 days only.  Clopidogrel times at least 6 months.  Cangrelor is used in the cardiac catheterization lab for IV antiplatelet therapy.  The infusion will be continued for a total of 2 hours, then the patient will be loaded with clopidogrel 600 mg per protocol.  Patient Profile     78 y.o. male with type 2 diabetes, hypertension, and mixed hyperlipidemia, admitted 08/17/2018 with out of hospital ventricular fibrillation arrest  [facility] Ventricular Fibrillation Arrest:    Normal EF.  Likely secondary to acute ischemia.    Treated as above  Elevated Troponin:    CAD as above. Status post intervention.  On triple therapy for anticoagulation for now.  Atrial Fibrillation w/ RVR:     Started Elqiuis.  Maintaining NSR  Hypokalemia:      Still low.  Will give extra potassium and   HTN:    Due to get a higher dose of beta blocker today.  Will add ARB today.   T2DM:      Per primary team.    Acute Diastolic HF:   Euvolemic  Med change as above.    For questions or updates, please contact [place] Please consult [email or web address] for contact info under Cardiology/STEMI.   Signed, Minus Breeding, MD  08/27/2018, 11:34 AM

## 2018-08-27 NOTE — Discharge Instructions (Signed)

## 2018-08-27 NOTE — Progress Notes (Signed)
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 I have spoken to dr patel, to disclose to patient and will test for covid 19 today, day 10 post exposure. Patient has admit 4/30 test negative. If questions, feel free to call.  [name] [name] for Infectious Diseases [phone]

## 2018-08-27 NOTE — Progress Notes (Signed)
[facility] Progress Note  Patient: [name] [name] YHC:[id number]   PCP: Chipper Herb, MD DOB: Sep 15, 1940   DOA: 08/17/2018   DOS: 08/27/2018   Date of Service: the patient was seen and examined on 08/27/2018  Brief hospital course: Pt. with PMH of diabetes; admitted on 08/17/2018, presented with complaint of cardiac arrest, was found to have V. fib arrest.  Bystander CPR.  ACLS chloride patient actually required 2 shocks for V. fib arrest.  Brought to the emergency department in comatose condition.  Underwent therapeutic hypothermia protocol.Cardiology was consulted and S/P cardiac catheterization Currently further plan is treatment of pneumonia.  Subjective: Remains confused no nausea no vomiting no fever no chills.  No other acute events overnight.  Assessment and Plan: Status-post cardiac arrest V. Fib cardiac arrest Likely preceded by hypoglycemia however initial rhythm VF  Peak troponin 2.77. S/p hypothermia protocol on 4/30. TTE with normal EF. - Cardiology consultation appreciated.   Acute hypoxic respiratory failure. Hafnia Alvei pneumonia Sensitive to Cefepime, Cipro and Bactrim - F/u blood cultures - Continue Cefepime. Plan for 7 day course (end date 5/12) - Patient was on ventilator.  Now extubated. - Still on 4 L of oxygen.  Continue incentive spirometry and flutter valve as much as possible.  Coronary artery disease S/P PCI Severe proximal LAD stenosis, treated successfully with PCI using a 3.0 x 20 mm Synergy DES Severe mid circumflex stenosis, treated successfully with a 2.25 x 12 mm Synergy DES Moderate distal RCA stenosis, nonobstructive Plan is aspirin for 30 days only and Plavix for at least 6 months  Heart failure with preserved ejection fraction No diuresis for now patient is -1.6 L in last 24 hours.  pAFRVR - Transition to oral amiodarone. - Initially treated with heparin infusion.  Now on Eliquis.  Hypertension-uncontrolled - Amlodipine, patient  was also on metoprolol 25 mg 3 times daily.  I will increase the dose to 50 mg twice daily. - PRN hydralazine  Hypokalemia - Continue to replete  Type 2 diabetes mellitus, uncontrolled with hyperglycemia, no complication. - CBGs AC at bedtime with SSI.  Also Levemir dose increased to 15 units twice daily. - Off Insulin gtt Check hemoglobin A1c tomorrow.  Nutritional Support - Swallow evaluation recommends dysphagia 2 diet nectar thick liquid.  Acute encephalopathy. Will use Ativan. QT prolonged cannot use Haldol.  Low risk exposure to COVID. SARS-CoV-2 test is negative.  Performed on 08/27/2018.  Day 10 of exposure.  Diet: Dysphagia 2 diet nectar thick liquid DVT Prophylaxis: subcutaneous Heparin  Advance goals of care discussion: Full code  Family Communication: no family was present at bedside, at the time of interview.  Discussed with patient's son on 08/26/2018. Unable to reach the son today.  Disposition:  Discharge to CIR.  Consultants: Cardiology, IP rehab Procedures: Cardiac cath  Scheduled Meds: . amiodarone  400 mg Oral BID   Followed by  . [START ON 08/31/2018] amiodarone  200 mg Oral Daily  . amLODipine  10 mg Oral Daily  . apixaban  5 mg Oral BID  . aspirin EC  81 mg Oral Daily  . atorvastatin  80 mg Oral q1800  . clopidogrel  75 mg Oral Q breakfast  . insulin aspart  0-5 Units Subcutaneous QHS  . insulin aspart  0-9 Units Subcutaneous TID WC  . insulin detemir  15 Units Subcutaneous BID  . irbesartan  75 mg Oral Daily  . levothyroxine  75 mcg Oral Daily  . metoprolol tartrate  50 mg  Oral BID  . [START ON 08/28/2018] potassium chloride  60 mEq Oral Daily  . QUEtiapine  25 mg Oral QHS  . senna-docusate  1 tablet Oral BID  . sodium chloride flush  3 mL Intravenous Q12H   Continuous Infusions: . sodium chloride 10 mL/hr at 08/24/18 1100  . sodium chloride 5 mL/hr at 08/27/18 1800  . ceFEPime (MAXIPIME) IV 2 g (08/27/18 0847)   PRN Meds: sodium  chloride, acetaminophen, fluticasone, hydrALAZINE, ipratropium-albuterol, LORazepam, Melatonin, ondansetron (ZOFRAN) IV, Resource ThickenUp Clear, sodium chloride flush Antibiotics: Anti-infectives (From admission, onward)   Start     Dose/Rate Route Frequency Ordered Stop   08/22/18 1000  ceFEPIme (MAXIPIME) 2 g in sodium chloride 0.9 % 100 mL IVPB     2 g 200 mL/hr over 30 Minutes Intravenous Every 12 hours 08/22/18 0828 08/29/18 0959   08/21/18 0600  vancomycin (VANCOCIN) IVPB 750 mg/150 ml premix  Status:  Discontinued     750 mg 150 mL/hr over 60 Minutes Intravenous Every 12 hours 08/20/18 1636 08/22/18 0817   08/20/18 1700  vancomycin (VANCOCIN) IVPB 1000 mg/200 mL premix     1,000 mg 200 mL/hr over 60 Minutes Intravenous  Once 08/20/18 1636 08/20/18 1928   08/20/18 0900  piperacillin-tazobactam (ZOSYN) IVPB 3.375 g  Status:  Discontinued     3.375 g 12.5 mL/hr over 240 Minutes Intravenous Every 8 hours 08/20/18 0753 08/22/18 0819   08/18/18 1800  azithromycin (ZITHROMAX) 500 mg in sodium chloride 0.9 % 250 mL IVPB  Status:  Discontinued     500 mg 250 mL/hr over 60 Minutes Intravenous Every 24 hours 08/17/18 1756 08/18/18 0823   08/18/18 1800  cefTRIAXone (ROCEPHIN) 1 g in sodium chloride 0.9 % 100 mL IVPB  Status:  Discontinued     1 g 200 mL/hr over 30 Minutes Intravenous Every 24 hours 08/17/18 1756 08/18/18 0823   08/18/18 0930  Ampicillin-Sulbactam (UNASYN) 3 g in sodium chloride 0.9 % 100 mL IVPB     3 g 200 mL/hr over 30 Minutes Intravenous Every 8 hours 08/18/18 0823 08/20/18 0116   08/17/18 2000  cefTRIAXone (ROCEPHIN) 1 g in sodium chloride 0.9 % 100 mL IVPB     1 g 200 mL/hr over 30 Minutes Intravenous  Once 08/17/18 1932 08/17/18 2258   08/17/18 2000  azithromycin (ZITHROMAX) 500 mg in sodium chloride 0.9 % 250 mL IVPB     500 mg 250 mL/hr over 60 Minutes Intravenous  Once 08/17/18 1932 08/18/18 0000   08/17/18 1730  cefTRIAXone (ROCEPHIN) 1 g in sodium chloride 0.9  % 100 mL IVPB  Status:  Discontinued     1 g 200 mL/hr over 30 Minutes Intravenous  Once 08/17/18 1723 08/17/18 1931   08/17/18 1730  azithromycin (ZITHROMAX) 500 mg in sodium chloride 0.9 % 250 mL IVPB  Status:  Discontinued     500 mg 250 mL/hr over 60 Minutes Intravenous  Once 08/17/18 1723 08/17/18 1932       Objective: Physical Exam: Vitals:   08/27/18 0005 08/27/18 0400 08/27/18 1115 08/27/18 1500  BP:  (!) 142/55 (!) 157/57 (!) 158/69  Pulse:  89 83 86  Resp:  (!) 27 (!) 21 19  Temp: 98.7 F (37.1 C) 98.4 F (36.9 C) 97.8 F (36.6 C) 97.9 F (36.6 C)  TempSrc: Axillary Axillary Oral Oral  SpO2:  91% 90% 98%  Weight:      Height:        Intake/Output Summary (Last 24  hours) at 08/27/2018 1903 Last data filed at 08/27/2018 1800 Gross per 24 hour  Intake 1048.2 ml  Output 1900 ml  Net -851.8 ml   Filed Weights   08/22/18 0500 08/23/18 0500 08/24/18 0500  Weight: 78.6 kg 79.1 kg 77 kg   General: Alert, Awake and Oriented to Time, Place and Person. Appear in moderate distress, affect irritable Eyes: PERRL, Conjunctiva normal ENT: Oral Mucosa clear moist. Neck: difficult to assess  JVD, no Abnormal Mass Or lumps Cardiovascular: S1 and S2 Present, no Murmur, Peripheral Pulses Present Respiratory: normal respiratory effort, Bilateral Air entry equal and Decreased, no use of accessory muscle, bilateral  Crackles, no wheezes Abdomen: Bowel Sound present, Soft and no tenderness, no hernia Skin: no redness, no Rash, no induration Extremities: no Pedal edema, no calf tenderness Neurologic: Grossly no focal neuro deficit. Bilaterally Equal motor strength  Data Reviewed: CBC: Recent Labs  Lab 08/23/18 0430 08/24/18 0438 08/25/18 0400 08/26/18 0219 08/27/18 0350  WBC 8.0 8.6 8.7 10.7* 9.5  HGB 9.2* 10.1* 10.1* 9.8* 9.3*  HCT 29.0* 31.7* 31.5* 31.0* 29.8*  MCV 84.1 83.4 83.3 83.8 84.7  PLT 186 232 226 236 329   Basic Metabolic Panel: Recent Labs  Lab 08/23/18  2119 08/24/18 0438 08/25/18 0645 08/25/18 1155 08/26/18 0219 08/27/18 0350  NA  --  146* 146* 148* 148* 146*  K  --  3.0* 2.8* 3.3* 3.4* 3.4*  CL  --  114* 113* 116* 118* 116*  CO2  --  23 25 22  21* 21*  GLUCOSE  --  180* 212* 184* 191* 217*  BUN  --  22 17 17 16 19   CREATININE  --  0.90 0.81 0.87 0.85 0.83  CALCIUM  --  8.6* 8.4* 8.5* 8.5* 8.5*  MG 1.9  --   --  1.8  --  2.0    Liver Function Tests: No results for input(s): AST, ALT, ALKPHOS, BILITOT, PROT, ALBUMIN in the last 168 hours. No results for input(s): LIPASE, AMYLASE in the last 168 hours. Recent Labs  Lab 08/26/18 1031  AMMONIA 43*   Coagulation Profile: No results for input(s): INR, PROTIME in the last 168 hours. Cardiac Enzymes: No results for input(s): CKTOTAL, CKMB, CKMBINDEX, TROPONINI in the last 168 hours. BNP (last 3 results) No results for input(s): PROBNP in the last 8760 hours. CBG: Recent Labs  Lab 08/26/18 2057 08/26/18 2059 08/27/18 0616 08/27/18 1103 08/27/18 1612  GLUCAP 69* 196* 180* 271* 206*   Studies: No results found.   Time spent: 35 minutes  Author: Berle Mull, MD [facility] 08/27/2018 7:03 PM  To reach On-call, see care teams to locate the attending and reach out to them via [email or web address]. If 7PM-7AM, please contact night-coverage If you still have difficulty reaching the attending provider, please page the [facility] (Director on Call) for [facility] on amion for assistance.

## 2018-08-28 ENCOUNTER — Inpatient Hospital Stay (HOSPITAL_COMMUNITY): Payer: Medicare PPO

## 2018-08-28 ENCOUNTER — Encounter (HOSPITAL_COMMUNITY): Payer: Self-pay | Admitting: Cardiovascular Disease

## 2018-08-28 DIAGNOSIS — I472 Ventricular tachycardia: Secondary | ICD-10-CM

## 2018-08-28 DIAGNOSIS — G931 Anoxic brain damage, not elsewhere classified: Secondary | ICD-10-CM

## 2018-08-28 DIAGNOSIS — R5381 Other malaise: Secondary | ICD-10-CM

## 2018-08-28 LAB — BASIC METABOLIC PANEL
Anion gap: 12 (ref 5–15)
Anion gap: 7 (ref 5–15)
BUN: 16 mg/dL (ref 8–23)
BUN: 18 mg/dL (ref 8–23)
CO2: 21 mmol/L — ABNORMAL LOW (ref 22–32)
CO2: 23 mmol/L (ref 22–32)
Calcium: 8.5 mg/dL — ABNORMAL LOW (ref 8.9–10.3)
Calcium: 8.8 mg/dL — ABNORMAL LOW (ref 8.9–10.3)
Chloride: 112 mmol/L — ABNORMAL HIGH (ref 98–111)
Chloride: 115 mmol/L — ABNORMAL HIGH (ref 98–111)
Creatinine, Ser: 0.78 mg/dL (ref 0.61–1.24)
Creatinine, Ser: 1.02 mg/dL (ref 0.61–1.24)
GFR calc Af Amer: >60 mL/min (ref >60)
GFR calc Af Amer: >60 mL/min (ref >60)
GFR calc non Af Amer: >60 mL/min (ref >60)
GFR calc non Af Amer: >60 mL/min (ref >60)
Glucose, Bld: 178 mg/dL — ABNORMAL HIGH (ref 70–99)
Glucose, Bld: 226 mg/dL — ABNORMAL HIGH (ref 70–99)
Potassium: 3.3 mmol/L — ABNORMAL LOW (ref 3.5–5.1)
Potassium: 3.7 mmol/L (ref 3.5–5.1)
Sodium: 145 mmol/L (ref 135–145)
Sodium: 145 mmol/L (ref 135–145)

## 2018-08-28 LAB — GLUCOSE, CAPILLARY
Glucose-Capillary: 157 mg/dL — ABNORMAL HIGH (ref 70–99)
Glucose-Capillary: 161 mg/dL — ABNORMAL HIGH (ref 70–99)
Glucose-Capillary: 148 mg/dL — ABNORMAL HIGH (ref 70–99)
Glucose-Capillary: 213 mg/dL — ABNORMAL HIGH (ref 70–99)
Glucose-Capillary: 278 mg/dL — ABNORMAL HIGH (ref 70–99)
Glucose-Capillary: 269 mg/dL — ABNORMAL HIGH (ref 70–99)
Glucose-Capillary: 184 mg/dL — ABNORMAL HIGH (ref 70–99)

## 2018-08-28 LAB — CBC
HCT: 31.4 % — ABNORMAL LOW (ref 39.0–52.0)
Hemoglobin: 9.8 g/dL — ABNORMAL LOW (ref 13.0–17.0)
MCH: 26.8 pg (ref 26.0–34.0)
MCHC: 31.2 g/dL (ref 30.0–36.0)
MCV: 86 fL (ref 80.0–100.0)
Platelets: 270 10*3/uL (ref 150–400)
RBC: 3.65 MIL/uL — ABNORMAL LOW (ref 4.22–5.81)
RDW: 15.1 % (ref 11.5–15.5)
WBC: 9.9 10*3/uL (ref 4.0–10.5)
nRBC: 0 % (ref 0.0–0.2)

## 2018-08-28 MED ORDER — ZOLPIDEM TARTRATE 5 MG PO TABS: 5.0000 mg | ORAL_TABLET | Freq: Every evening | ORAL | Status: DC | PRN

## 2018-08-28 MED ORDER — HALOPERIDOL LACTATE 5 MG/ML IJ SOLN
2.0000 mg | Freq: Four times a day (QID) | INTRAMUSCULAR | Status: DC | PRN
  Administered 2018-08-29: 2 mg via INTRAVENOUS
  Filled 2018-08-28: qty 1

## 2018-08-28 MED FILL — Lidocaine HCl Local Preservative Free (PF) Inj 1%: INTRAMUSCULAR | Qty: 30 | Status: AC

## 2018-08-28 NOTE — Progress Notes (Signed)
Physical Therapy Treatment Patient Details Name: [name] [name] MRN: [id number] DOB: 1940/09/14 Today's Date: 08/28/2018    History of Present Illness Pt is a 78 year old male who presented to the ED on 4/30 after a witnessed cardiac arrest. Immediate bystander CPR initiated. Pt found to be in V-fib on EMS arrival and was given 1 round of Epi and 2 shocks. ROSC achieved after ~11 minutes of CPR. Pt intubated in the field; extubated 5/6. CT Head negative for acute changes. PMH of T2DM, HH, GERD.    PT Comments    Pt admitted with above diagnosis. Pt currently with functional limitations due to the deficits listed below (see PT Problem List). Pt was able to sit EOB but with max to mod assist for 8 min with pt leaning left unless cued and assisted to sit upright.  Pt difficult to direct to task.  If pt does not improve in mentation, may have to change d/c plan.  Will follow acutely.   Pt will benefit from skilled PT to increase their independence and safety with mobility to allow discharge to the venue listed below.     Follow Up Recommendations  CIR;Supervision/Assistance - 24 hour     Equipment Recommendations  Other (comment)(TBA)    Recommendations for Other Services Rehab consult     Precautions / Restrictions Precautions Precautions: Fall Precaution Comments: sitter for delerium  Restrictions Weight Bearing Restrictions: No    Mobility  Bed Mobility Overal bed mobility: Needs Assistance Bed Mobility: Supine to Sit;Sit to Supine     Supine to sit: Mod assist;Max assist;+2 for physical assistance Sit to supine: Mod assist;Max assist;+2 for physical assistance   General bed mobility comments: assist based on how much pt can focus on task.  Pt flings himself every direction.   Transfers Overall transfer level: Needs assistance   Transfers: Sit to/from Stand Sit to Stand: Total assist;+2 physical assistance        Lateral/Scoot Transfers: Total assist;+2 physical  assistance General transfer comment: Unable to stand as he could not even sit EOB as he had severe left lateral lean and was confused and kep trying to lie back down.   Ambulation/Gait             General Gait Details: pt unable whether weakness or focus   Stairs             Wheelchair Mobility    Modified Rankin (Stroke Patients Only)       Balance Overall balance assessment: Needs assistance Sitting-balance support: Single extremity supported;Bilateral upper extremity supported Sitting balance-Leahy Scale: Poor Sitting balance - Comments: bias posteriorly and to left.  Did improve posture briefly by having pt prop on right elbow and come back to sitting and he sat for a few min with min guard assist and then was right back to left lateral lean.   could not maintain balance in sitting with exercises.                                     Cognition Arousal/Alertness: Awake/alert Behavior During Therapy: Restless Overall Cognitive Status: Impaired/Different from baseline Area of Impairment: Orientation;Attention;Following commands;Safety/judgement;Awareness;Problem solving                 Orientation Level: Place;Time;Situation Current Attention Level: Focused   Following Commands: Follows one step commands inconsistently Safety/Judgement: Decreased awareness of safety;Decreased awareness of deficits Awareness: Intellectual Problem Solving: Slow processing;Difficulty  sequencing;Requires verbal cues;Requires tactile cues General Comments: delerious with sitter      Exercises General Exercises - Lower Extremity Long Arc Quad: AROM;Both;5 reps;Seated    General Comments General comments (skin integrity, edema, etc.): 98% on RA.       Pertinent Vitals/Pain Faces Pain Scale: No hurt    Home Living                      Prior Function            PT Goals (current goals can now be found in the care plan section) Acute Rehab PT  Goals Patient Stated Goal: pt unable to participate Progress towards PT goals: Progressing toward goals    Frequency    Min 3X/week      PT Plan Current plan remains appropriate    Co-evaluation              AM-PAC PT "6 Clicks" Mobility   Outcome Measure  Help needed turning from your back to your side while in a flat bed without using bedrails?: A Lot Help needed moving from lying on your back to sitting on the side of a flat bed without using bedrails?: Total Help needed moving to and from a bed to a chair (including a wheelchair)?: Total Help needed standing up from a chair using your arms (e.g., wheelchair or bedside chair)?: Total Help needed to walk in hospital room?: Total Help needed climbing 3-5 steps with a railing? : Total 6 Click Score: 7    End of Session Equipment Utilized During Treatment: Gait belt Activity Tolerance: Patient limited by fatigue(limited by AMS) Patient left: in bed;with call bell/phone within reach;with nursing/sitter in room;with bed alarm set Nurse Communication: Mobility status PT Visit Diagnosis: Other abnormalities of gait and mobility (R26.89);Muscle weakness (generalized) (M62.81);Difficulty in walking, not elsewhere classified (R26.2)     Time: 1093-2355 PT Time Calculation (min) (ACUTE ONLY): 11 min  Charges:  $Therapeutic Activity: 8-22 mins                     [place] Pager:  [phone]  Office:  [place] 08/28/2018, 3:40 PM

## 2018-08-28 NOTE — Care Management Important Message (Signed)
Important Message  Patient Details  Name: [name] [name] MRN: [id number] Date of Birth: 19-Mar-1941   Medicare Important Message Given:  Yes    Memory Argue 08/28/2018, 3:56 PM

## 2018-08-28 NOTE — Progress Notes (Addendum)
[facility] Progress Note  Patient: [name] [name] PXT:[id number]   PCP: Chipper Herb, MD DOB: [birth year]   DOA: 08/17/2018   DOS: 08/28/2018   Date of Service: the patient was seen and examined on 08/28/2018  Brief hospital course: Pt. with PMH of diabetes; admitted on 08/17/2018, presented with complaint of cardiac arrest, was found to have V. fib arrest.  Bystander CPR.  ACLS chloride patient actually required 2 shocks for V. fib arrest.  Brought to the emergency department in comatose condition.  Underwent therapeutic hypothermia protocol.Cardiology was consulted and S/P cardiac catheterization Currently further plan is treatment of pneumonia.  Subjective: Still confused.  No nausea or vomiting.  Refusing some other medicines.  No fever no chills.  Assessment and Plan: Status-post cardiac arrest V. Fib cardiac arrest Likely preceded by hypoglycemia however initial rhythm VF  Peak troponin 2.77. S/p hypothermia protocol on 4/30. TTE with normal EF. - Cardiology consultation appreciated.   Acute hypoxic respiratory failure. Hafnia Alvei pneumonia Sensitive to Cefepime, Cipro and Bactrim - F/u blood cultures - Continue Cefepime. Plan for 7 day course (end date 5/12) - Patient was on ventilator.  Now extubated. - Still on 2L of oxygen.  Continue incentive spirometry and flutter valve as much as possible.  Coronary artery disease S/P PCI Severe proximal LAD stenosis, treated successfully with PCI using a 3.0 x 20 mm Synergy DES Severe mid circumflex stenosis, treated successfully with a 2.25 x 12 mm Synergy DES Moderate distal RCA stenosis, nonobstructive Plan is aspirin for 30 days only and Plavix for at least 6 months  Heart failure with preserved ejection fraction No diuresis for now  pAFRVR - Transition to oral amiodarone. - Initially treated with heparin infusion.  Now on Eliquis.  Hypertension-uncontrolled - Amlodipine, patient was also on metoprolol 25 mg 3 times  daily.  I will increase the dose to 50 mg twice daily. - PRN hydralazine  Hypokalemia - Continue to replete  Type 2 diabetes mellitus, uncontrolled with hyperglycemia, no complication. - CBGs AC at bedtime with SSI.  Also Levemir dose increased to 15 units twice daily. - Off Insulin gtt Check hemoglobin A1c tomorrow.  Nutritional Support - Swallow evaluation recommends dysphagia 2 diet nectar thick liquid.  Acute encephalopathy. 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 EEG abnormal negative for any active seizures. QT improved for now. Will use PRN Haldol and schedule Seroquel. Maintain potassium more than 4.  Low risk exposure to COVID. SARS-CoV-2 test is negative.  Performed on 08/27/2018.  Day 10 of exposure.  Diet: Dysphagia 2 diet nectar thick liquid DVT Prophylaxis: subcutaneous Heparin  Advance goals of care discussion: Full code  Family Communication: no family was present at bedside, at the time of interview.  Discussed with patient's son on 08/26/2018. Unable to reach the son today.  Disposition:  Discharge to CIR.  Consultants: Cardiology, IP rehab Procedures: Cardiac cath  Scheduled Meds: . amiodarone  400 mg Oral BID   Followed by  . [START ON 08/31/2018] amiodarone  200 mg Oral Daily  . amLODipine  10 mg Oral Daily  . apixaban  5 mg Oral BID  . aspirin EC  81 mg Oral Daily  . atorvastatin  80 mg Oral q1800  . clopidogrel  75 mg Oral Q breakfast  . insulin aspart  0-5 Units Subcutaneous QHS  . insulin aspart  0-9 Units Subcutaneous TID WC  . insulin detemir  15 Units Subcutaneous BID  .  irbesartan  75 mg Oral Daily  . levothyroxine  75 mcg Oral Daily  . mouth rinse  15 mL Mouth Rinse BID  . metoprolol tartrate  50 mg Oral BID  . potassium chloride  60 mEq Oral Daily  . QUEtiapine  25 mg Oral QHS  . senna-docusate  1 tablet Oral BID  . sodium chloride flush  3 mL  Intravenous Q12H   Continuous Infusions: . sodium chloride 10 mL/hr at 08/24/18 1100  . sodium chloride 5 mL/hr at 08/28/18 0600  . ceFEPime (MAXIPIME) IV 2 g (08/28/18 1024)   PRN Meds: sodium chloride, acetaminophen, fluticasone, haloperidol lactate, hydrALAZINE, ipratropium-albuterol, ondansetron (ZOFRAN) IV, Resource ThickenUp Clear, sodium chloride flush, zolpidem Antibiotics: Anti-infectives (From admission, onward)   Start     Dose/Rate Route Frequency Ordered Stop   08/22/18 1000  ceFEPIme (MAXIPIME) 2 g in sodium chloride 0.9 % 100 mL IVPB     2 g 200 mL/hr over 30 Minutes Intravenous Every 12 hours 08/22/18 0828 08/29/18 0959   08/21/18 0600  vancomycin (VANCOCIN) IVPB 750 mg/150 ml premix  Status:  Discontinued     750 mg 150 mL/hr over 60 Minutes Intravenous Every 12 hours 08/20/18 1636 08/22/18 0817   08/20/18 1700  vancomycin (VANCOCIN) IVPB 1000 mg/200 mL premix     1,000 mg 200 mL/hr over 60 Minutes Intravenous  Once 08/20/18 1636 08/20/18 1928   08/20/18 0900  piperacillin-tazobactam (ZOSYN) IVPB 3.375 g  Status:  Discontinued     3.375 g 12.5 mL/hr over 240 Minutes Intravenous Every 8 hours 08/20/18 0753 08/22/18 0819   08/18/18 1800  azithromycin (ZITHROMAX) 500 mg in sodium chloride 0.9 % 250 mL IVPB  Status:  Discontinued     500 mg 250 mL/hr over 60 Minutes Intravenous Every 24 hours 08/17/18 1756 08/18/18 0823   08/18/18 1800  cefTRIAXone (ROCEPHIN) 1 g in sodium chloride 0.9 % 100 mL IVPB  Status:  Discontinued     1 g 200 mL/hr over 30 Minutes Intravenous Every 24 hours 08/17/18 1756 08/18/18 0823   08/18/18 0930  Ampicillin-Sulbactam (UNASYN) 3 g in sodium chloride 0.9 % 100 mL IVPB     3 g 200 mL/hr over 30 Minutes Intravenous Every 8 hours 08/18/18 0823 08/20/18 0116   08/17/18 2000  cefTRIAXone (ROCEPHIN) 1 g in sodium chloride 0.9 % 100 mL IVPB     1 g 200 mL/hr over 30 Minutes Intravenous  Once 08/17/18 1932 08/17/18 2258   08/17/18 2000  azithromycin  (ZITHROMAX) 500 mg in sodium chloride 0.9 % 250 mL IVPB     500 mg 250 mL/hr over 60 Minutes Intravenous  Once 08/17/18 1932 08/18/18 0000   08/17/18 1730  cefTRIAXone (ROCEPHIN) 1 g in sodium chloride 0.9 % 100 mL IVPB  Status:  Discontinued     1 g 200 mL/hr over 30 Minutes Intravenous  Once 08/17/18 1723 08/17/18 1931   08/17/18 1730  azithromycin (ZITHROMAX) 500 mg in sodium chloride 0.9 % 250 mL IVPB  Status:  Discontinued     500 mg 250 mL/hr over 60 Minutes Intravenous  Once 08/17/18 1723 08/17/18 1932       Objective: Physical Exam: Vitals:   08/28/18 0923 08/28/18 1130 08/28/18 1200 08/28/18 1515  BP: 138/87 (!) 152/86  117/65  Pulse: 88  72 76  Resp: 20  (!) 24 (!) 23  Temp: 97.7 F (36.5 C) 97.8 F (36.6 C)    TempSrc: Oral Oral    SpO2: 100%  94% 95%  Weight:      Height:        Intake/Output Summary (Last 24 hours) at 08/28/2018 1903 Last data filed at 08/28/2018 1854 Gross per 24 hour  Intake 187.49 ml  Output 1050 ml  Net -862.51 ml   Filed Weights   08/22/18 0500 08/23/18 0500 08/24/18 0500  Weight: 78.6 kg 79.1 kg 77 kg   General: Alert, Awake and Oriented to Time, Place and Person. Appear in moderate distress, affect irritable Eyes: PERRL, Conjunctiva normal ENT: Oral Mucosa clear moist. Neck: difficult to assess  JVD, no Abnormal Mass Or lumps Cardiovascular: S1 and S2 Present, no Murmur, Peripheral Pulses Present Respiratory: normal respiratory effort, Bilateral Air entry equal and Decreased, no use of accessory muscle, bilateral  Crackles, no wheezes Abdomen: Bowel Sound present, Soft and no tenderness, no hernia Skin: no redness, no Rash, no induration Extremities: no Pedal edema, no calf tenderness Neurologic: Grossly no focal neuro deficit. Bilaterally Equal motor strength  Data Reviewed: CBC: Recent Labs  Lab 08/24/18 0438 08/25/18 0400 08/26/18 0219 08/27/18 0350 08/28/18 0334  WBC 8.6 8.7 10.7* 9.5 9.9  HGB 10.1* 10.1* 9.8* 9.3*  9.8*  HCT 31.7* 31.5* 31.0* 29.8* 31.4*  MCV 83.4 83.3 83.8 84.7 86.0  PLT 232 226 236 245 643   Basic Metabolic Panel: Recent Labs  Lab 08/23/18 2119  08/25/18 0645 08/25/18 1155 08/26/18 0219 08/27/18 0350 08/28/18 0334  NA  --    < > 146* 148* 148* 146* 145  K  --    < > 2.8* 3.3* 3.4* 3.4* 3.3*  CL  --    < > 113* 116* 118* 116* 112*  CO2  --    < > 25 22 21* 21* 21*  GLUCOSE  --    < > 212* 184* 191* 217* 178*  BUN  --    < > 17 17 16 19 16   CREATININE  --    < > 0.81 0.87 0.85 0.83 0.78  CALCIUM  --    < > 8.4* 8.5* 8.5* 8.5* 8.5*  MG 1.9  --   --  1.8  --  2.0  --    < > = values in this interval not displayed.    Liver Function Tests: No results for input(s): AST, ALT, ALKPHOS, BILITOT, PROT, ALBUMIN in the last 168 hours. No results for input(s): LIPASE, AMYLASE in the last 168 hours. Recent Labs  Lab 08/26/18 1031  AMMONIA 43*   Coagulation Profile: No results for input(s): INR, PROTIME in the last 168 hours. Cardiac Enzymes: No results for input(s): CKTOTAL, CKMB, CKMBINDEX, TROPONINI in the last 168 hours. BNP (last 3 results) No results for input(s): PROBNP in the last 8760 hours. CBG: Recent Labs  Lab 08/27/18 1103 08/27/18 1612 08/27/18 2138 08/28/18 0559 08/28/18 1127  GLUCAP 271* 206* 161* 148* 213*   Studies: No results found.   Time spent: 35 minutes  Author: Berle Mull, MD [facility] 08/28/2018 7:03 PM  To reach On-call, see care teams to locate the attending and reach out to them via [email or web address]. If 7PM-7AM, please contact night-coverage If you still have difficulty reaching the attending provider, please page the [facility] (Director on Call) for [facility] on amion for assistance.

## 2018-08-28 NOTE — Progress Notes (Addendum)
Progress Note  Patient Name: [name] [name] Date of Encounter: 08/28/2018  Primary Cardiologist: No primary care provider on file. Dr. Burt Knack  Subjective   With sitter, drowsy, not answering questions.  Inpatient Medications    Scheduled Meds: . amiodarone  400 mg Oral BID   Followed by  . [START ON 08/31/2018] amiodarone  200 mg Oral Daily  . amLODipine  10 mg Oral Daily  . apixaban  5 mg Oral BID  . aspirin EC  81 mg Oral Daily  . atorvastatin  80 mg Oral q1800  . clopidogrel  75 mg Oral Q breakfast  . insulin aspart  0-5 Units Subcutaneous QHS  . insulin aspart  0-9 Units Subcutaneous TID WC  . insulin detemir  15 Units Subcutaneous BID  . irbesartan  75 mg Oral Daily  . levothyroxine  75 mcg Oral Daily  . mouth rinse  15 mL Mouth Rinse BID  . metoprolol tartrate  50 mg Oral BID  . potassium chloride  60 mEq Oral Daily  . QUEtiapine  25 mg Oral QHS  . senna-docusate  1 tablet Oral BID  . sodium chloride flush  3 mL Intravenous Q12H   Continuous Infusions: . sodium chloride 10 mL/hr at 08/24/18 1100  . sodium chloride 5 mL/hr at 08/28/18 0600  . ceFEPime (MAXIPIME) IV Stopped (08/27/18 2217)   PRN Meds: sodium chloride, acetaminophen, fluticasone, hydrALAZINE, ipratropium-albuterol, LORazepam, Melatonin, ondansetron (ZOFRAN) IV, Resource ThickenUp Clear, sodium chloride flush   Vital Signs    Vitals:   08/27/18 1900 08/27/18 2300 08/27/18 2327 08/28/18 0300  BP: (!) 156/62 (!) 176/60 127/71 139/67  Pulse: 83 73  84  Resp: (!) 21 (!) 23  12  Temp: 97.8 F (36.6 C) 98 F (36.7 C)  98.1 F (36.7 C)  TempSrc: Oral Axillary  Oral  SpO2: 99% 98%  91%  Weight:      Height:        Intake/Output Summary (Last 24 hours) at 08/28/2018 1015 Last data filed at 08/28/2018 0900 Gross per 24 hour  Intake 607.82 ml  Output 950 ml  Net -342.18 ml   Last 3 Weights 08/24/2018 08/23/2018 08/22/2018  Weight (lbs) 169 lb 12.1 oz 174 lb 6.1 oz 173 lb 4.5 oz  Weight (kg) 77 kg  79.1 kg 78.6 kg      Telemetry    NSR - Personally Reviewed  ECG    None recent  Physical Exam   GEN: No acute distress.  Drowsy Neck: No JVD Cardiac: RRR, no murmurs, rubs, or gallops.  Respiratory: Clear to auscultation bilaterally. GI: Soft, nontender, non-distended  MS: Trace ankle edema; No deformity. Neuro:  Nonfocal  Psych: [place]    Chemistry Recent Labs  Lab 08/26/18 0219 08/27/18 0350 08/28/18 0334  NA 148* 146* 145  K 3.4* 3.4* 3.3*  CL 118* 116* 112*  CO2 21* 21* 21*  GLUCOSE 191* 217* 178*  BUN 16 19 16   CREATININE 0.85 0.83 0.78  CALCIUM 8.5* 8.5* 8.5*  GFRNONAA >60 >60 >60  GFRAA >60 >60 >60  ANIONGAP 9 9 12      Hematology Recent Labs  Lab 08/26/18 0219 08/27/18 0350 08/28/18 0334  WBC 10.7* 9.5 9.9  RBC 3.70* 3.52* 3.65*  HGB 9.8* 9.3* 9.8*  HCT 31.0* 29.8* 31.4*  MCV 83.8 84.7 86.0  MCH 26.5 26.4 26.8  MCHC 31.6 31.2 31.2  RDW 15.3 15.2 15.1  PLT 236 245 270    Cardiac EnzymesNo  results for input(s): TROPONINI in the last 168 hours. No results for input(s): TROPIPOC in the last 168 hours.   BNPNo results for input(s): BNP, PROBNP in the last 168 hours.   DDimer No results for input(s): DDIMER in the last 168 hours.   Radiology    No results found.  Cardiac Studies   Cath results reviewed  Patient Profile     78 y.o. male With VT arrest, NSTEMI/CAD  Assessment & Plan    1) COntinue DAPT for at least 6 months, along with aggressive secondary prevention.  He appears euvolemic.  No respiratory issues.  Triple therapy for 1 month.  Stop aspirin after 1 month  AFib: Now in NSR. On Eliquis.     For questions or updates, please contact [place] Please consult [email or web address] for contact info under        Signed, [name], MD  08/28/2018, 10:15 AM

## 2018-08-28 NOTE — Progress Notes (Signed)
Inpatient Rehab Admissions:  Inpatient Rehab Consult received.  I met with patient at the bedside for rehabilitation assessment and to discuss goals and expectations of an inpatient rehab admission.  He is interested in a rehab admission, however remains confused (asking several times if his son is in the waiting room).  I will attempt to contact his family today to discuss goals/expectations and see if they are open to possible rehab admission later this week pending insurance approval, bed availability, and medical readiness.   Signed: Shann Medal, PT, DPT Admissions Coordinator [phone] 08/28/18  2:14 PM

## 2018-08-28 NOTE — Progress Notes (Signed)
EEG completed, results pending. 

## 2018-08-28 NOTE — Procedures (Signed)
History: 78 year old male with confusion after cardiac arrest  Sedation: None  Technique: This is a 21 channel routine scalp EEG performed at the bedside with bipolar and monopolar montages arranged in accordance to the international 10/20 system of electrode placement. One channel was dedicated to EKG recording.    Background: There is a posterior dominant rhythm of 8 Hz which is seen bilaterally.  In addition, there is intrusion into the background of generalized, frontally predominant irregular delta and theta activities.  Sleep is not recorded.  There were no epileptiform discharges seen.  Photic stimulation: Physiologic driving is not performed  EEG Abnormalities: 1) generalized irregular slow activity  Clinical Interpretation: This EEG is consistent with a mild generalized nonspecific cerebral dysfunction (encephalopathy). There was no seizure or seizure predisposition recorded on this study. Please note that lack of epileptiform activity on EEG does not preclude the possibility of epilepsy.   Roland Rack, MD [facility] [phone]  If 7pm- 7am, please page neurology on call as listed in [place].

## 2018-08-28 NOTE — Consult Note (Signed)
Physical Medicine and Rehabilitation Consult Reason for Consult: Decreased functional mobility Referring Physician: [facility]   HPI: [name] [name] is a 78 y.o. right-handed male with history of hypertension, diabetes mellitus.  Per chart review patient lives with spouse.  Independent prior to admission.  Presented 08/17/2018 after witnessed cardiac arrest at [facility].  CPR initiated by bystander.  Per report patient had initially complained of fatigue possibly low blood sugar was trying to eat a muffin before witnessed arrest.  Patient reportedly struck his head when he collapsed.  He received 11 minutes of CPR.  Admit 08/17/2018 COVID negative.  Intubated at the scene.  EKG showed A. fib with RVR.  Cranial CT scan negative.  WBC 18,000, troponin 0 0.03.  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  Patient was extubated 08/23/2018.  Currently maintained on aspirin, Plavix as well as Eliquis.  Cardiology continues to follow amiodarone has been adjusted.  Dysphasia #2 nectar thick liquid diet.  Patient with ongoing bouts of confusion and restlessness.  Therapy evaluations completed with recommendations of physical medicine rehab consult.   Review of Systems  Unable to perform ROS: Acuity of condition   No past medical history on file. The histories are not reviewed yet. Please review them in the "History" navigator section and refresh this [place]. Family History  Family history unknown: Yes   Social History:  has no history on file for tobacco, alcohol, and drug. Allergies: Not on File Medications Prior to Admission  Medication Sig Dispense Refill  . aspirin EC 81 MG tablet Take 81 mg by mouth daily.    . Cinnamon 500 MG capsule Take 500 mg by mouth daily.    . fluticasone (FLONASE) 50 MCG/ACT nasal spray Place 2 sprays into both nostrils daily as needed for allergies.    . Garlic 6073 MG CAPS Take 1,000 mg by mouth daily.    . insulin  detemir (LEVEMIR) 100 UNIT/ML injection Inject 50 Units into the skin daily.    . metFORMIN (GLUCOPHAGE) 1000 MG tablet Take 1,000 mg by mouth 2 (two) times daily.    . quinapril (ACCUPRIL) 20 MG tablet Take 20 mg by mouth daily.    [place] levothyroxine (SYNTHROID) 75 MCG tablet Take 75 mcg by mouth daily.      Home: Home Living Family/patient expects to be discharged to:: Private residence Living Arrangements: Spouse/significant other Available Help at Discharge: Family Type of Home: House  Functional History: Prior Function Level of Independence: Independent Functional Status:  Mobility: Bed Mobility Overal bed mobility: Needs Assistance Bed Mobility: Supine to Sit, Sit to Supine Supine to sit: Mod assist, Max assist Sit to supine: Mod assist, Max assist General bed mobility comments: assist based on how much pt can focus on task. Transfers Overall transfer level: Needs assistance Transfers: Sit to/from Stand, Lateral/Scoot Transfers Sit to Stand: Max assist  Lateral/Scoot Transfers: Max assist General transfer comment: cues for hand placement, assist for more boost than assisted forward.  Max redirection to task. Ambulation/Gait General Gait Details: pt unable whether weakness or focus    ADL:    Cognition: Cognition Overall Cognitive Status: Impaired/Different from baseline Orientation Level: Disoriented X4 Cognition Arousal/Alertness: Awake/alert Behavior During Therapy: Restless Overall Cognitive Status: Impaired/Different from baseline Area of Impairment: Orientation, Attention, Following commands, Safety/judgement, Awareness, Problem solving Orientation Level: Place, Time, Situation Current Attention Level: Focused Following Commands: Follows one step commands inconsistently Safety/Judgement: Decreased awareness of safety, Decreased awareness of deficits Awareness: Intellectual Problem  Solving: Slow processing, Difficulty sequencing, Requires verbal cues, Requires  tactile cues General Comments: delerious with sitter  Blood pressure 139/67, pulse 84, temperature 98.1 F (36.7 C), temperature source Oral, resp. rate 12, height 5\' 11"  (1.803 m), weight 77 kg, SpO2 91 %. Physical Exam  Constitutional: No distress.  Frail appearing  Eyes: Pupils are equal, round, and reactive to light.  Neck: Normal range of motion.  Cardiovascular: Normal rate.  Respiratory: Effort normal.  GI: Soft.  Musculoskeletal:        General: No edema.  Neurological:  Patient is alert and makes eye contact.  He is a bit restless.  Pleasantly confused.  He does provide the name of the hospital.  He stated he ate four doughnuts and made him sick and that is why he is in the hospital.  He does follow commands with limited insight/awareness. Moves all 4's at least 3/5. Senses pain  Skin: He is not diaphoretic.    Results for orders placed or performed during the hospital encounter of 08/17/18 (from the past 24 hour(s))  Glucose, capillary     Status: Abnormal   Collection Time: 08/27/18  6:16 AM  Result Value Ref Range   Glucose-Capillary 180 (H) 70 - 99 mg/dL  Glucose, capillary     Status: Abnormal   Collection Time: 08/27/18 11:03 AM  Result Value Ref Range   Glucose-Capillary 271 (H) 70 - 99 mg/dL   Comment 1 Notify RN    Comment 2 Document in Chart   SARS Coronavirus 2 (CEPHEID - Performed in [place] hospital lab), Hosp Order     Status: None   Collection Time: 08/27/18 12:00 PM  Result Value Ref Range   SARS Coronavirus 2 NEGATIVE NEGATIVE  Glucose, capillary     Status: Abnormal   Collection Time: 08/27/18  4:12 PM  Result Value Ref Range   Glucose-Capillary 206 (H) 70 - 99 mg/dL   Comment 1 Notify RN    Comment 2 Document in Chart   Glucose, capillary     Status: Abnormal   Collection Time: 08/27/18  9:38 PM  Result Value Ref Range   Glucose-Capillary 161 (H) 70 - 99 mg/dL  Basic metabolic panel     Status: Abnormal   Collection Time: 08/28/18  3:34 AM   Result Value Ref Range   Sodium 145 135 - 145 mmol/L   Potassium 3.3 (L) 3.5 - 5.1 mmol/L   Chloride 112 (H) 98 - 111 mmol/L   CO2 21 (L) 22 - 32 mmol/L   Glucose, Bld 178 (H) 70 - 99 mg/dL   BUN 16 8 - 23 mg/dL   Creatinine, Ser 0.78 0.61 - 1.24 mg/dL   Calcium 8.5 (L) 8.9 - 10.3 mg/dL   GFR calc non Af Amer >60 >60 mL/min   GFR calc Af Amer >60 >60 mL/min   Anion gap 12 5 - 15  CBC     Status: Abnormal   Collection Time: 08/28/18  3:34 AM  Result Value Ref Range   WBC 9.9 4.0 - 10.5 K/uL   RBC 3.65 (L) 4.22 - 5.81 MIL/uL   Hemoglobin 9.8 (L) 13.0 - 17.0 g/dL   HCT 31.4 (L) 39.0 - 52.0 %   MCV 86.0 80.0 - 100.0 fL   MCH 26.8 26.0 - 34.0 pg   MCHC 31.2 30.0 - 36.0 g/dL   RDW 15.1 11.5 - 15.5 %   Platelets 270 150 - 400 K/uL   nRBC 0.0 0.0 - 0.2 %  Glucose, capillary     Status: Abnormal   Collection Time: 08/28/18  5:59 AM  Result Value Ref Range   Glucose-Capillary 148 (H) 70 - 99 mg/dL   No results found.   Assessment/Plan: Diagnosis: 78 yo male with debility and encephalopathy related to cardiac arrest and multiple medical issues 1. Does the need for close, 24 hr/day medical supervision in concert with the patient's rehab needs make it unreasonable for this patient to be served in a less intensive setting? Yes 2. Co-Morbidities requiring supervision/potential complications: HTN, DM, dysphagia, afib 3. Due to bladder management, bowel management, safety, skin/wound care, disease management, medication administration, pain management and patient education, does the patient require 24 hr/day rehab nursing? Yes 4. Does the patient require coordinated care of a physician, rehab nurse, PT (1-2 hrs/day, 5 days/week), OT (1-2 hrs/day, 5 days/week) and SLP (1-2 hrs/day, 5 days/week) to address physical and functional deficits in the context of the above medical diagnosis(es)? Yes Addressing deficits in the following areas: balance, endurance, locomotion, strength, transferring,  bowel/bladder control, bathing, dressing, feeding, grooming, toileting, cognition, speech, swallowing and psychosocial support 5. Can the patient actively participate in an intensive therapy program of at least 3 hrs of therapy per day at least 5 days per week? Yes 6. The potential for patient to make measurable gains while on inpatient rehab is excellent 7. Anticipated functional outcomes upon discharge from inpatient rehab are supervision  with PT, supervision and min assist with OT, supervision with SLP. 8. Estimated rehab length of stay to reach the above functional goals is: 15-22 days 9. Anticipated D/C setting: Home 10. Anticipated post D/C treatments: [place] therapy 11. Overall Rehab/Functional Prognosis: excellent  RECOMMENDATIONS: This patient's condition is appropriate for continued rehabilitative care in the following setting: CIR Patient has agreed to participate in recommended program. N/A Note that insurance prior authorization may be required for reimbursement for recommended care.  Comment: Rehab Admissions Coordinator to follow up.  Thanks,  Meredith Staggers, MD, Mellody Drown  I have personally performed a face to face diagnostic evaluation of this patient. Additionally, I have examined pertinent labs and radiographic images. I have reviewed and concur with the physician assistant's documentation above.    Lavon Paganini Angiulli, PA-C 08/28/2018

## 2018-08-28 NOTE — Progress Notes (Signed)
Cardiac Rehab Advisory Cardiac Rehab Phase I is not seeing pts face to face at this time due to Covid 19 restrictions. Ambulation is occurring through nursing, PT, and mobility teams. We will help facilitate that process as needed. We are calling pts in their rooms and discussing education. We will then deliver education materials to pts RN for delivery to pt.   [id number] Read cardiology note that pt still very drowsy and not answering questions. Put in CRP 2 referral for [place] so they can f/up. Will continue to follow and educate as pt becomes appropriate for information. He has written materials in room. Will continue to follow. Graylon Good RN BSN 08/28/2018 11:24 AM

## 2018-08-28 NOTE — Progress Notes (Signed)
  Speech Language Pathology Treatment: Dysphagia  Patient Details Name: [name] [name] MRN: [id number] DOB: Apr 20, 1940 Today's Date: 08/28/2018 Time: 3646-8032 SLP Time Calculation (min) (ACUTE ONLY): 14 min  Assessment / Plan / Recommendation Clinical Impression  Pt remains alert but confused. His mastication and oral preparation with advanced solids is only mildly prolonged despite having minimal dentition, but his high distractibility increases his risk for incomplete oral preparation and/or premature spillage, either of which could precipitate aspiration. Pt had reportedly told MD that he had choked on a donut leading to arrest, although unclear how reliable this is given his level of confusion. SLP also provided advanced boluses of thin liquids, which consistently triggered a delayed cough when attempting the three-ounce water screen. He has no coughing with nectar thick liquids, consumed in large, sequential boluses. Would not make any changes to his diet today; however, should signs of aspiration persist, he may benefit from MBS to better assess oropharyngeal swallow.    HPI HPI: Pt is a 78 year old male who presented to the ED on 4/30 after a witnessed cardiac arrest. Immediate bystander CPR initiated. Pt found to be in V-fib on EMS arrival and was given 1 round of Epi and 2 shocks. ROSC achieved after ~11 minutes of CPR. Pt intubated in the field; extubated 5/6. CT Head negative for acute changes. PMH of T2DM, HH, GERD.      SLP Plan  Continue with current plan of care       Recommendations  Diet recommendations: Dysphagia 2 (fine chop);Nectar-thick liquid Liquids provided via: Cup;Straw Medication Administration: Whole meds with puree Supervision: Staff to assist with self feeding;Full supervision/cueing for compensatory strategies Compensations: Minimize environmental distractions;Slow rate;Small sips/bites Postural Changes and/or Swallow Maneuvers: Seated upright 90 degrees                 Oral Care Recommendations: Oral care BID Follow up Recommendations: Inpatient Rehab SLP Visit Diagnosis: Dysphagia, unspecified (R13.10) Plan: Continue with current plan of care       [name] [name] 08/28/2018, 10:19 AM  Pollyann Glen, M.A. [place] Acute Environmental education officer [phone] Office [phone]

## 2018-08-29 DIAGNOSIS — Z955 Presence of coronary angioplasty implant and graft: Secondary | ICD-10-CM

## 2018-08-29 LAB — BASIC METABOLIC PANEL
Anion gap: 7 (ref 5–15)
BUN: 17 mg/dL (ref 8–23)
CO2: 23 mmol/L (ref 22–32)
Calcium: 8.9 mg/dL (ref 8.9–10.3)
Chloride: 116 mmol/L — ABNORMAL HIGH (ref 98–111)
Creatinine, Ser: 0.88 mg/dL (ref 0.61–1.24)
GFR calc Af Amer: >60 mL/min (ref >60)
GFR calc non Af Amer: >60 mL/min (ref >60)
Glucose, Bld: 125 mg/dL — ABNORMAL HIGH (ref 70–99)
Potassium: 3 mmol/L — ABNORMAL LOW (ref 3.5–5.1)
Sodium: 146 mmol/L — ABNORMAL HIGH (ref 135–145)

## 2018-08-29 LAB — GLUCOSE, CAPILLARY
Glucose-Capillary: 106 mg/dL — ABNORMAL HIGH (ref 70–99)
Glucose-Capillary: 157 mg/dL — ABNORMAL HIGH (ref 70–99)
Glucose-Capillary: 442 mg/dL — ABNORMAL HIGH (ref 70–99)
Glucose-Capillary: 428 mg/dL — ABNORMAL HIGH (ref 70–99)
Glucose-Capillary: 300 mg/dL — ABNORMAL HIGH (ref 70–99)

## 2018-08-29 LAB — CBC
HCT: 34.2 % — ABNORMAL LOW (ref 39.0–52.0)
Hemoglobin: 10.7 g/dL — ABNORMAL LOW (ref 13.0–17.0)
MCH: 26.4 pg (ref 26.0–34.0)
MCHC: 31.3 g/dL (ref 30.0–36.0)
MCV: 84.2 fL (ref 80.0–100.0)
Platelets: 367 10*3/uL (ref 150–400)
RBC: 4.06 MIL/uL — ABNORMAL LOW (ref 4.22–5.81)
RDW: 14.8 % (ref 11.5–15.5)
WBC: 10.9 10*3/uL — ABNORMAL HIGH (ref 4.0–10.5)
nRBC: 0 % (ref 0.0–0.2)

## 2018-08-29 MED ORDER — INSULIN ASPART 100 UNIT/ML ~~LOC~~ SOLN
15.0000 [IU] | Freq: Once | SUBCUTANEOUS | Status: AC
  Administered 2018-08-29: 19:00:00 15 [IU] via SUBCUTANEOUS

## 2018-08-29 MED ORDER — POTASSIUM CHLORIDE 10 MEQ/100ML IV SOLN
10.0000 meq | INTRAVENOUS | Status: AC
  Administered 2018-08-29 (×2): 10 meq via INTRAVENOUS
  Filled 2018-08-29 (×2): qty 100

## 2018-08-29 MED ORDER — INSULIN DETEMIR 100 UNIT/ML ~~LOC~~ SOLN
20.0000 [IU] | Freq: Two times a day (BID) | SUBCUTANEOUS | Status: DC
  Administered 2018-08-29 – 2018-08-30 (×2): 20 [IU] via SUBCUTANEOUS
  Filled 2018-08-29 (×4): qty 0.2

## 2018-08-29 MED ORDER — POTASSIUM CHLORIDE CRYS ER 20 MEQ PO TBCR
40.0000 meq | EXTENDED_RELEASE_TABLET | ORAL | Status: AC
  Administered 2018-08-29 (×2): 40 meq via ORAL
  Filled 2018-08-29 (×2): qty 2

## 2018-08-29 MED ORDER — AMLODIPINE BESYLATE 5 MG PO TABS
5.0000 mg | ORAL_TABLET | Freq: Every day | ORAL | Status: DC
  Administered 2018-08-29 – 2018-08-30 (×2): 5 mg via ORAL
  Filled 2018-08-29 (×3): qty 1

## 2018-08-29 NOTE — Progress Notes (Signed)
[facility] Progress Note  Patient: [name] [name] XTK:[id number]   PCP: Chipper Herb, MD DOB: [birth year]   DOA: 08/17/2018   DOS: 08/29/2018   Date of Service: the patient was seen and examined on 08/29/2018  Brief hospital course: Pt. with PMH of diabetes; admitted on 08/17/2018, presented with complaint of cardiac arrest, was found to have V. fib arrest.  Bystander CPR.  ACLS chloride patient actually required 2 shocks for V. fib arrest.  Brought to the emergency department in comatose condition.  Underwent therapeutic hypothermia protocol.Cardiology was consulted and S/P cardiac catheterization Currently further plan is treatment of pneumonia.  Subjective: Agitation better.  Did not require at the bedside sitter after middle of the night yesterday. No nausea no vomiting no fever no chills no chest pain abdominal pain.  Throughout the day patient remained calm.  Assessment and Plan: Status-post cardiac arrest V. Fib cardiac arrest Likely preceded by hypoglycemia however initial rhythm VF  Peak troponin 2.77. S/p hypothermia protocol on 4/30.  TTE with normal EF. Cardiology consultation appreciated.   Acute hypoxic respiratory failure. [place] Alvei pneumonia Sensitive to Cefepime, Cipro and Bactrim No growth on blood cultures Treated with cefepime. Plan for 7 day course (end date 5/12) Patient was on ventilator.  Now extubated. Now on room air. Continue incentive spirometry and flutter valve as much as possible.  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 No diuresis for now  pAFRVR - Transition to oral  amiodarone. - Initially treated with heparin infusion.  Now on Eliquis.  Hypertension-uncontrolled - Amlodipine, Lopressor 50 mg twice daily - PRN hydralazine  Hypokalemia - Continue to replete  Type 2 diabetes mellitus, uncontrolled with hyperglycemia, no complication. - CBGs AC at bedtime with SSI.  Also Levemir.  Dose increased to 20 twice daily - Off Insulin gtt Hemoglobin A1c 8.7 suggesting poor control.  Nutritional Support - Swallow evaluation recommends dysphagia 2 diet nectar thick liquid.  Acute encephalopathy. 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 EEG abnormal negative for any active seizures. QT improved for now. Will use PRN Haldol and schedule Seroquel. Maintain potassium more than 4.  Low risk exposure to COVID. SARS-CoV-2 test is negative.  Performed on 08/27/2018.  Day 10 of exposure.  Diet: Dysphagia 2 diet nectar thick liquid DVT Prophylaxis: subcutaneous Heparin  Advance goals of care discussion: Full code  Family Communication: no family was present at bedside, at the time of interview.  Discussed with patient's son  Disposition:  Discharge to CIR.  Consultants: Cardiology, IP rehab Procedures: Cardiac cath  Scheduled Meds: . amiodarone  400 mg Oral BID   Followed by  . [START ON 08/31/2018] amiodarone  200 mg Oral Daily  . amLODipine  5 mg Oral Daily  . apixaban  5 mg Oral BID  . aspirin EC  81 mg Oral Daily  . atorvastatin  80 mg Oral q1800  . clopidogrel  75 mg Oral Q breakfast  . insulin aspart  0-5 Units Subcutaneous QHS  . insulin aspart  0-9 Units Subcutaneous TID WC  . insulin detemir  15 Units Subcutaneous BID  .  irbesartan  75 mg Oral Daily  . levothyroxine  75 mcg Oral Daily  . mouth rinse  15 mL Mouth Rinse BID  . metoprolol tartrate  50 mg Oral BID  . QUEtiapine  25 mg Oral QHS  . senna-docusate  1 tablet Oral BID  . sodium chloride flush  3  mL Intravenous Q12H   Continuous Infusions: . sodium chloride Stopped (08/29/18 0400)  . sodium chloride Stopped (08/28/18 1833)   PRN Meds: sodium chloride, acetaminophen, fluticasone, haloperidol lactate, hydrALAZINE, ipratropium-albuterol, ondansetron (ZOFRAN) IV, Resource ThickenUp Clear, sodium chloride flush, zolpidem Antibiotics: Anti-infectives (From admission, onward)   Start     Dose/Rate Route Frequency Ordered Stop   08/22/18 1000  ceFEPIme (MAXIPIME) 2 g in sodium chloride 0.9 % 100 mL IVPB     2 g 200 mL/hr over 30 Minutes Intravenous Every 12 hours 08/22/18 0828 08/28/18 2200   08/21/18 0600  vancomycin (VANCOCIN) IVPB 750 mg/150 ml premix  Status:  Discontinued     750 mg 150 mL/hr over 60 Minutes Intravenous Every 12 hours 08/20/18 1636 08/22/18 0817   08/20/18 1700  vancomycin (VANCOCIN) IVPB 1000 mg/200 mL premix     1,000 mg 200 mL/hr over 60 Minutes Intravenous  Once 08/20/18 1636 08/20/18 1928   08/20/18 0900  piperacillin-tazobactam (ZOSYN) IVPB 3.375 g  Status:  Discontinued     3.375 g 12.5 mL/hr over 240 Minutes Intravenous Every 8 hours 08/20/18 0753 08/22/18 0819   08/18/18 1800  azithromycin (ZITHROMAX) 500 mg in sodium chloride 0.9 % 250 mL IVPB  Status:  Discontinued     500 mg 250 mL/hr over 60 Minutes Intravenous Every 24 hours 08/17/18 1756 08/18/18 0823   08/18/18 1800  cefTRIAXone (ROCEPHIN) 1 g in sodium chloride 0.9 % 100 mL IVPB  Status:  Discontinued     1 g 200 mL/hr over 30 Minutes Intravenous Every 24 hours 08/17/18 1756 08/18/18 0823   08/18/18 0930  Ampicillin-Sulbactam (UNASYN) 3 g in sodium chloride 0.9 % 100 mL IVPB     3 g 200 mL/hr over 30 Minutes Intravenous Every 8 hours 08/18/18 0823 08/20/18 0116   08/17/18 2000  cefTRIAXone (ROCEPHIN) 1 g in sodium chloride 0.9 % 100 mL IVPB     1 g 200 mL/hr over 30 Minutes Intravenous  Once 08/17/18 1932 08/17/18 2258   08/17/18 2000  azithromycin (ZITHROMAX) 500 mg in sodium chloride 0.9 %  250 mL IVPB     500 mg 250 mL/hr over 60 Minutes Intravenous  Once 08/17/18 1932 08/18/18 0000   08/17/18 1730  cefTRIAXone (ROCEPHIN) 1 g in sodium chloride 0.9 % 100 mL IVPB  Status:  Discontinued     1 g 200 mL/hr over 30 Minutes Intravenous  Once 08/17/18 1723 08/17/18 1931   08/17/18 1730  azithromycin (ZITHROMAX) 500 mg in sodium chloride 0.9 % 250 mL IVPB  Status:  Discontinued     500 mg 250 mL/hr over 60 Minutes Intravenous  Once 08/17/18 1723 08/17/18 1932       Objective: Physical Exam: Vitals:   08/29/18 0300 08/29/18 0712 08/29/18 1122 08/29/18 1438  BP: 118/63     Pulse: 84 75    Resp: 20     Temp: 98.3 F (36.8 C) 98.6 F (37 C) 98.6 F (37 C) 97.8 F (36.6 C)  TempSrc: Axillary Oral Oral Oral  SpO2: 95% 100%    Weight:      Height:        Intake/Output Summary (  Last 24 hours) at 08/29/2018 1736 Last data filed at 08/29/2018 1210 Gross per 24 hour  Intake 852.78 ml  Output 1725 ml  Net -872.22 ml   Filed Weights   08/22/18 0500 08/23/18 0500 08/24/18 0500  Weight: 78.6 kg 79.1 kg 77 kg   General: Alert, Awake and Oriented to Person. Appear in moderate distress, affect irritable Eyes: PERRL, Conjunctiva normal ENT: Oral Mucosa clear moist. Neck: difficult to assess  JVD, no Abnormal Mass Or lumps Cardiovascular: S1 and S2 Present, no Murmur, Peripheral Pulses Present Respiratory: normal respiratory effort, Bilateral Air entry equal and Decreased, no use of accessory muscle, bilateral  Crackles, no wheezes Abdomen: Bowel Sound present, Soft and no tenderness, no hernia Skin: no redness, no Rash, no induration Extremities: no Pedal edema, no calf tenderness Neurologic: Grossly no focal neuro deficit. Bilaterally Equal motor strength  Data Reviewed: CBC: Recent Labs  Lab 08/25/18 0400 08/26/18 0219 08/27/18 0350 08/28/18 0334 08/29/18 0244  WBC 8.7 10.7* 9.5 9.9 10.9*  HGB 10.1* 9.8* 9.3* 9.8* 10.7*  HCT 31.5* 31.0* 29.8* 31.4* 34.2*  MCV 83.3  83.8 84.7 86.0 84.2  PLT 226 236 245 270 264   Basic Metabolic Panel: Recent Labs  Lab 08/23/18 2119  08/25/18 1155 08/26/18 0219 08/27/18 0350 08/28/18 0334 08/28/18 2006 08/29/18 0244  NA  --    < > 148* 148* 146* 145 145 146*  K  --    < > 3.3* 3.4* 3.4* 3.3* 3.7 3.0*  CL  --    < > 116* 118* 116* 112* 115* 116*  CO2  --    < > 22 21* 21* 21* 23 23  GLUCOSE  --    < > 184* 191* 217* 178* 226* 125*  BUN  --    < > 17 16 19 16 18 17   CREATININE  --    < > 0.87 0.85 0.83 0.78 1.02 0.88  CALCIUM  --    < > 8.5* 8.5* 8.5* 8.5* 8.8* 8.9  MG 1.9  --  1.8  --  2.0  --   --   --    < > = values in this interval not displayed.    Liver Function Tests: No results for input(s): AST, ALT, ALKPHOS, BILITOT, PROT, ALBUMIN in the last 168 hours. No results for input(s): LIPASE, AMYLASE in the last 168 hours. Recent Labs  Lab 08/26/18 1031  AMMONIA 43*   Coagulation Profile: No results for input(s): INR, PROTIME in the last 168 hours. Cardiac Enzymes: No results for input(s): CKTOTAL, CKMB, CKMBINDEX, TROPONINI in the last 168 hours. BNP (last 3 results) No results for input(s): PROBNP in the last 8760 hours. CBG: Recent Labs  Lab 08/28/18 1911 08/28/18 2130 08/29/18 0615 08/29/18 1039 08/29/18 1641  GLUCAP 269* 184* 106* 157* 442*   Studies: No results found.   Time spent: 35 minutes  Author: Berle Mull, MD [facility] 08/29/2018 5:36 PM  To reach On-call, see care teams to locate the attending and reach out to them via [email or web address]. If 7PM-7AM, please contact night-coverage If you still have difficulty reaching the attending provider, please page the [facility] (Director on Call) for [facility] on amion for assistance.

## 2018-08-29 NOTE — Evaluation (Addendum)
Occupational Therapy Evaluation Patient Details Name: [name] [name] MRN: [id number] DOB: 05/10/1940 Today's Date: 08/29/2018    History of Present Illness Pt is a 78 y.o. male admitted 08/17/18 after witnessed cardiac arrest, CPR initiated by bystander; intubated at scene. ETT 4/30-5/6. EKG showed afib with RVR. Head CT negative for acute abnormality. Cardiac cath showed severe proximal LAD stenosis s/p stending. Also being treated for PNA. Pt with ongoing bouts of confusion and restlessness; likely multifactorial from delirium and anoxic brain injury. EEG with mild generalized nonspecific cerebral dysfunction; no seizure or seizure predisposition. PMH includes DM2.   Clinical Impression   PTA, pt was living with his wife and was independent with ADLs, IADLs, and driving per his report. Pt currently requiring Min A for UB ADLs, Max A for LB ADLs, and Mod A for functional transfers with RW. Pt presenting with decreased cognition, strength, and balance impacting safety and functional performance. Pt presenting with high motivation to participate in therapy. Pt would benefit from further acute OT to facilitate safe dc. Recommend dc to CIR for further OT to optimize safety, independence with ADLs, and return to PLOF.      Follow Up Recommendations  CIR;Supervision/Assistance - 24 hour    Equipment Recommendations  Other (comment)(Defer to next venue)    Recommendations for Other Services PT consult;Rehab consult     Precautions / Restrictions Precautions Precautions: Fall;Other (comment) Precaution Comments: Bilateral soft mitts Restrictions Weight Bearing Restrictions: No      Mobility Bed Mobility Overal bed mobility: Needs Assistance Bed Mobility: Supine to Sit     Supine to sit: Min assist;HOB elevated     General bed mobility comments: In recliner upon arrival  Transfers Overall transfer level: Needs assistance Equipment used: Rolling walker (2 wheeled) Transfers: Sit  to/from Stand Sit to Stand: Max assist         General transfer comment: Max A to power up into standing from recliner and then prevent posterior lean. Pt requiring increased asssitance to gain balance. Poor correction of balance with cues    Balance Overall balance assessment: Needs assistance Sitting-balance support: Single extremity supported;Bilateral upper extremity supported Sitting balance-Leahy Scale: Fair Sitting balance - Comments: Able to maintain bouts of seated balance with min guard, but moving in all directiosn requiring frequents cues to focus on sitting upright. Pt reports "woozy" although BP/vitals stable   Standing balance support: Bilateral upper extremity supported Standing balance-Leahy Scale: Poor Standing balance comment: Reliant on UE support and external assist                           ADL either performed or assessed with clinical judgement   ADL Overall ADL's : Needs assistance/impaired Eating/Feeding: Set up;Supervision/ safety;Sitting Eating/Feeding Details (indicate cue type and reason): Pt managing his coffee and placing a straw into the lid. Supervision for safety while pt drank his coffee for safety with mitts off. Grooming: Brushing hair;Oral care;Sitting;Set up;Supervision/safety Grooming Details (indicate cue type and reason): supervision for safety. Cues for attention. Pt brushing his hair and brushing his teeth while seated in recliner. Upon spitting into the basin during oral care, pt spitting on himself and lap instead of basin.  Upper Body Bathing: Minimal assistance;Sitting   Lower Body Bathing: Maximal assistance;Sit to/from stand   Upper Body Dressing : Minimal assistance;Sitting   Lower Body Dressing: Maximal assistance;Sit to/from stand   Toilet Transfer: Maximal assistance(sit<>stand at recliner)  Functional mobility during ADLs: Maximal assistance;Rolling walker(sit<>stand) General ADL Comments: Pt  presenting with decreased cognition and balance impacting his safety and functional performance     Vision         Perception     Praxis      Pertinent Vitals/Pain Pain Assessment: No/denies pain Faces Pain Scale: No hurt Pain Intervention(s): Monitored during session     Hand Dominance Right   Extremity/Trunk Assessment Upper Extremity Assessment Upper Extremity Assessment: Generalized weakness   Lower Extremity Assessment Lower Extremity Assessment: Defer to PT evaluation RLE Deficits / Details: moves spontaneously against gravity.  No MMT due to pt unable to follow direction.  Pt able to bear weight in standing RLE Coordination: decreased fine motor LLE Deficits / Details: moves spontaneously against gravity.  no MMT due to unable to follow direction. LLE Coordination: decreased fine motor   Cervical / Trunk Assessment Cervical / Trunk Assessment: Kyphotic;Other exceptions Cervical / Trunk Exceptions: Posterior lean   Communication Communication Communication: No difficulties   Cognition Arousal/Alertness: Awake/alert Behavior During Therapy: Restless Overall Cognitive Status: Impaired/Different from baseline Area of Impairment: Orientation;Attention;Following commands;Safety/judgement;Awareness;Problem solving                 Orientation Level: Disoriented to;Time Current Attention Level: Focused   Following Commands: Follows one step commands inconsistently Safety/Judgement: Decreased awareness of safety;Decreased awareness of deficits Awareness: Intellectual Problem Solving: Slow processing;Difficulty sequencing;Requires verbal cues;Requires tactile cues General Comments: Pt following simple commands. When asked what day it is, pt able to report it is May 12 but then stated "1920". Pt requiring increased cues and time throughout session. Poor attention and safety awarness.    General Comments  VSS throughout    Exercises     Shoulder Instructions       Home Living Family/patient expects to be discharged to:: Private residence Living Arrangements: Spouse/significant other Available Help at Discharge: Family Type of Home: House             Bathroom Shower/Tub: Tub/shower unit         Home Equipment: Environmental consultant - 2 wheels   Additional Comments: Enjoys riding his gollf cart around      Prior Functioning/Environment Level of Independence: Independent        Comments: ADLs, IADLs, driving        OT Problem List: Decreased strength;Decreased range of motion;Decreased activity tolerance;Impaired balance (sitting and/or standing);Decreased safety awareness;Decreased knowledge of use of DME or AE;Decreased knowledge of precautions;Decreased cognition      OT Treatment/Interventions: Self-care/ADL training;Therapeutic exercise;Energy conservation;DME and/or AE instruction;Therapeutic activities;Patient/family education    OT Goals(Current goals can be found in the care plan section) Acute Rehab OT Goals Patient Stated Goal: I need to get back to walking so I can see my wife OT Goal Formulation: With patient Time For Goal Achievement: 09/12/18 Potential to Achieve Goals: Good  OT Frequency: Min 2X/week   Barriers to D/C:            Co-evaluation              AM-PAC OT "6 Clicks" Daily Activity     Outcome Measure Help from another person eating meals?: None Help from another person taking care of personal grooming?: A Little Help from another person toileting, which includes using toliet, bedpan, or urinal?: A Lot Help from another person bathing (including washing, rinsing, drying)?: A Lot Help from another person to put on and taking off regular upper body clothing?: A Little Help from another  person to put on and taking off regular lower body clothing?: A Lot 6 Click Score: 16   End of Session Equipment Utilized During Treatment: Surveyor, mining Communication: Mobility status;Other (comment)(Notified  NT that pt drink his coffee and she will watch)  Activity Tolerance: Patient tolerated treatment well Patient left: in chair;with call bell/phone within reach;with chair alarm set  OT Visit Diagnosis: Unsteadiness on feet (R26.81);Other abnormalities of gait and mobility (R26.89);Muscle weakness (generalized) (M62.81);Other symptoms and signs involving cognitive function                Time:  1122- 1136   Charges:  OT General Charges $OT Visit: 1 Visit OT Evaluation $OT Eval Moderate Complexity: [place], OTR/L Acute Rehab Pager: [phone] Office: [place] 08/29/2018, 2:40 PM

## 2018-08-29 NOTE — Progress Notes (Signed)
Nutrition Follow-up  RD working remotely.  DOCUMENTATION CODES:   Not applicable  INTERVENTION:  - Continue Vital Cuisine Shake TID with meals, each supplement provides 520 kcal and 22 grams of protein  - Add Magic cup BID with meals, each supplement provides 290 kcal and 9 grams of protein  NUTRITION DIAGNOSIS:   Inadequate oral intake related to inability to eat as evidenced by NPO status.  Progressing, pt now on Dysphagia 2 diet with nectar-thick liquids  GOAL:   Patient will meet greater than or equal to 90% of their needs  Progressing  MONITOR:   PO intake, Supplement acceptance, Diet advancement, Labs, Weight trends, I & O's  REASON FOR ASSESSMENT:   Ventilator, Consult Enteral/tube feeding initiation and management  ASSESSMENT:   78 year old male who presented to the ED on 4/30 after a witnessed cardiac arrest. Immediate bystander CPR initiated. Pt found to be in V-fib on EMS arrival and was given 1 round of Epi and 2 shocks. ROSC achieved after ~11 minutes of CPR. Pt intubated in the field. Pt transferred to the ICU and TTM initiated. PMH of T2DM.  5/04 - post-pyloric Cortrak placed 5/06 - extubated, diet advanced to Dysphagia 2 with nectar-thick liquids 5/08 - left and right heart catheterization 5/09 - pt removed Cortrak  Weight down 6 lbs since admission. Suspect this is related to fluid fluctuations.  Attempted to speak with pt via phone call to room but pt did not answer. Will continue with current thickened oral nutrition shake and add Magic Cup ice cream supplement BID with lunch and dinner meals.  Meal Completion: 0-100% x last 8 recorded meals (extremely varied, averaging 41%)  Medications reviewed and include: SSI, Levemir 15 units BID, Senna  Labs reviewed: sodium 146 (H), potassium 3.0 (L), chloride 116 (H) CBG's: 157, 106, 184, 269, 278 x 24 hours  UOP: 1375 ml x 24 hours  Diet Order:   Diet Order            DIET DYS 2 Room service  appropriate? Yes; Fluid consistency: Nectar Thick  Diet effective now              EDUCATION NEEDS:   Not appropriate for education at this time  Skin:  Skin Assessment: Reviewed RN Assessment  Last BM:  08/28/18  Height:   Ht Readings from Last 1 Encounters:  08/17/18 5\' 11"  (1.803 m)    Weight:   Wt Readings from Last 1 Encounters:  08/24/18 77 kg    Ideal Body Weight:  78.2 kg  BMI:  Body mass index is 23.68 kg/m.  Estimated Nutritional Needs:   Kcal:  1900-2100  Protein:  100-115 grams  Fluid:  >/= 1.9 L    Gaynell Face, MS, RD, LDN Inpatient Clinical Dietitian Pager: [phone] Weekend/After Hours: [phone]

## 2018-08-29 NOTE — Progress Notes (Addendum)
Patient 1736 blood sugar 428. Contacted the provider for orders. Orders for Levimeir @2200  noted. Awaiting orders. RN will continue to monitor.  Orders updated pt order one time 15 units of novoLOG given at 1838.

## 2018-08-29 NOTE — Progress Notes (Signed)
Physical Therapy Treatment Patient Details Name: [name] [name] MRN: [id number] DOB: 1940/05/31 Today's Date: 08/29/2018    History of Present Illness Pt is a 78 y.o. male admitted 08/17/18 after witnessed cardiac arrest, CPR initiated by bystander; intubated at scene. ETT 4/30-5/6. EKG showed afib with RVR. Head CT negative for acute abnormality. Cardiac cath showed severe proximal LAD stenosis s/p stending. Also being treated for PNA. Pt with ongoing bouts of confusion and restlessness; likely multifactorial from delirium and anoxic brain injury. EEG with mild generalized nonspecific cerebral dysfunction; no seizure or seizure predisposition. PMH includes DM2.   PT Comments    Pt progressing with mobility. Improved cognition this session, although continues to demonstrate some confusion, very poor attention and decreased safety awareness. Able to initiate standing transfers and gait training with RW, pt requiring up to mod-maxA to maintain static and dynamic standing balance. Pt motivated to participate in order to return home to his wife. Continue to recommend intensive CIR-level therapies to maximize functional mobility and independence prior to return home.   Follow Up Recommendations  CIR;Supervision/Assistance - 24 hour     Equipment Recommendations  (TBD)    Recommendations for Other Services Rehab consult     Precautions / Restrictions Precautions Precautions: Fall;Other (comment) Precaution Comments: Bilateral soft mitts Restrictions Weight Bearing Restrictions: No    Mobility  Bed Mobility Overal bed mobility: Needs Assistance Bed Mobility: Supine to Sit     Supine to sit: Min assist;HOB elevated     General bed mobility comments: Initial minA to sit EOB, although pt falling in various directions requiring frequent cues to focus on sitting up; requiring intermittent minA to correct/maintain balance  Transfers Overall transfer level: Needs assistance   Transfers:  Sit to/from Stand Sit to Stand: Mod assist         General transfer comment: Able to stand 4x during session, requiring min-modA for trunk elevation, but also assist to steady RW and prevent posterior LOB upon standing. Max, multimodal cues to focus on task and cues upright posture  Ambulation/Gait Ambulation/Gait assistance: Mod assist;Max assist Gait Distance (Feet): 5 Feet Assistive device: Rolling walker (2 wheeled) Gait Pattern/deviations: Step-to pattern Gait velocity: Decreased   General Gait Details: Performed 2x trials ambulating steps forwards/backwards with RW, requiring intermittent mod-maxA to prevent posterior LOB, at times min guard with cues. DOE 3/4 requiring seated rest between trials. SpO2 >89% on RA   Stairs             Wheelchair Mobility    Modified Rankin (Stroke Patients Only)       Balance Overall balance assessment: Needs assistance Sitting-balance support: Single extremity supported;Bilateral upper extremity supported Sitting balance-Leahy Scale: Fair Sitting balance - Comments: Able to maintain bouts of seated balance with min guard, but moving in all directiosn requiring frequents cues to focus on sitting upright. Pt reports "woozy" although BP/vitals stable     Standing balance-Leahy Scale: Poor Standing balance comment: Reliant on UE support and external assist                            Cognition Arousal/Alertness: Awake/alert Behavior During Therapy: Restless Overall Cognitive Status: Impaired/Different from baseline Area of Impairment: Orientation;Attention;Following commands;Safety/judgement;Awareness;Problem solving                 Orientation Level: Disoriented to;Time             General Comments: Initially disoriented to situation, but later telling story  how he is here because "we went to the mall to get donuts and I started choking, but no one knew CPR." Pt aware he was disoriented asking for wife and  son although he is at hospital. Pt with poor attention and safety awareness, requiring frequent cues for redirection      Exercises      General Comments        Pertinent Vitals/Pain Pain Assessment: No/denies pain    Home Living Family/patient expects to be discharged to:: Private residence Living Arrangements: Spouse/significant other Available Help at Discharge: Family Type of Home: [place]: Environmental consultant - 2 wheels Additional Comments: Enjoys riding his gollf cart around    Prior Function Level of Independence: Independent      Comments: ADLs, IADLs, driving   PT Goals (current goals can now be found in the care plan section) Acute Rehab PT Goals Patient Stated Goal: I need to get back to walking so I can see my wife PT Goal Formulation: With patient Time For Goal Achievement: 09/09/18 Potential to Achieve Goals: Good Progress towards PT goals: Progressing toward goals    Frequency    Min 3X/week      PT Plan Current plan remains appropriate    Co-evaluation              AM-PAC PT "6 Clicks" Mobility   Outcome Measure  Help needed turning from your back to your side while in a flat bed without using bedrails?: A Little Help needed moving from lying on your back to sitting on the side of a flat bed without using bedrails?: A Little Help needed moving to and from a bed to a chair (including a wheelchair)?: A Lot Help needed standing up from a chair using your arms (e.g., wheelchair or bedside chair)?: A Lot Help needed to walk in hospital room?: A Lot Help needed climbing 3-5 steps with a railing? : A Lot 6 Click Score: 14    End of Session Equipment Utilized During Treatment: Gait belt Activity Tolerance: Patient tolerated treatment well Patient left: in chair;with call bell/phone within reach;with chair alarm set;with nursing/sitter in room Nurse Communication: Mobility status(NT to find connector for chair alarm box to wall so  nursing station gets notified) PT Visit Diagnosis: Other abnormalities of gait and mobility (R26.89);Muscle weakness (generalized) (M62.81);Difficulty in walking, not elsewhere classified (R26.2)     Time: 0258-5277 PT Time Calculation (min) (ACUTE ONLY): 25 min  Charges:  $Gait Training: 8-22 mins $Therapeutic Activity: 8-22 mins                    Mabeline Caras, PT, DPT Acute Rehabilitation Services  Pager [phone] Office [place] 08/29/2018, 12:33 PM

## 2018-08-29 NOTE — Progress Notes (Signed)
Inpatient Rehab Admissions Coordinator:   I was able to speak to pt's son, Benicio, who reports they will be able to arrange 24/7 support at discharge if needed.  I will open up a case with pt's insurance to see if they will approve an inpatient admission possibly this week.   Shann Medal, PT, DPT Admissions Coordinator [phone] 08/29/18  3:34 PM

## 2018-08-29 NOTE — Progress Notes (Signed)
Progress Note  Patient Name: [name] [name] Date of Encounter: 08/29/2018  Primary Cardiologist: No primary care provider on file. Dr. Burt Knack  Subjective   No chest pain  Inpatient Medications    Scheduled Meds: . amiodarone  400 mg Oral BID   Followed by  . [START ON 08/31/2018] amiodarone  200 mg Oral Daily  . amLODipine  5 mg Oral Daily  . apixaban  5 mg Oral BID  . aspirin EC  81 mg Oral Daily  . atorvastatin  80 mg Oral q1800  . clopidogrel  75 mg Oral Q breakfast  . insulin aspart  0-5 Units Subcutaneous QHS  . insulin aspart  0-9 Units Subcutaneous TID WC  . insulin detemir  15 Units Subcutaneous BID  . irbesartan  75 mg Oral Daily  . levothyroxine  75 mcg Oral Daily  . mouth rinse  15 mL Mouth Rinse BID  . metoprolol tartrate  50 mg Oral BID  . potassium chloride  40 mEq Oral Q2H  . QUEtiapine  25 mg Oral QHS  . senna-docusate  1 tablet Oral BID  . sodium chloride flush  3 mL Intravenous Q12H   Continuous Infusions: . sodium chloride Stopped (08/29/18 0400)  . sodium chloride Stopped (08/28/18 1833)  . potassium chloride     PRN Meds: sodium chloride, acetaminophen, fluticasone, haloperidol lactate, hydrALAZINE, ipratropium-albuterol, ondansetron (ZOFRAN) IV, Resource ThickenUp Clear, sodium chloride flush, zolpidem   Vital Signs    Vitals:   08/28/18 1919 08/28/18 2300 08/29/18 0300 08/29/18 0712  BP: 120/60 122/60 118/63   Pulse: 82 71 84 75  Resp: 20 18 20    Temp: 97.6 F (36.4 C) 97.8 F (36.6 C) 98.3 F (36.8 C) 98.6 F (37 C)  TempSrc: Oral Axillary Axillary Oral  SpO2: 94% 94% 95% 100%  Weight:      Height:        Intake/Output Summary (Last 24 hours) at 08/29/2018 0939 Last data filed at 08/29/2018 0600 Gross per 24 hour  Intake 359.78 ml  Output 1375 ml  Net -1015.22 ml   Last 3 Weights 08/24/2018 08/23/2018 08/22/2018  Weight (lbs) 169 lb 12.1 oz 174 lb 6.1 oz 173 lb 4.5 oz  Weight (kg) 77 kg 79.1 kg 78.6 kg      Telemetry    NSR  - Personally Reviewed  ECG      Physical Exam   GEN: No acute distress.   Neck: No JVD Cardiac: RRR, no murmurs, rubs, or gallops.  Respiratory: Clear to auscultation bilaterally. GI: Soft, nontender, non-distended  MS: No edema; No deformity. Neuro:  Nonfocal  Psych: Normal affect   Labs    Chemistry Recent Labs  Lab 08/28/18 0334 08/28/18 2006 08/29/18 0244  NA 145 145 146*  K 3.3* 3.7 3.0*  CL 112* 115* 116*  CO2 21* 23 23  GLUCOSE 178* 226* 125*  BUN 16 18 17   CREATININE 0.78 1.02 0.88  CALCIUM 8.5* 8.8* 8.9  GFRNONAA >60 >60 >60  GFRAA >60 >60 >60  ANIONGAP 12 7 7      Hematology Recent Labs  Lab 08/27/18 0350 08/28/18 0334 08/29/18 0244  WBC 9.5 9.9 10.9*  RBC 3.52* 3.65* 4.06*  HGB 9.3* 9.8* 10.7*  HCT 29.8* 31.4* 34.2*  MCV 84.7 86.0 84.2  MCH 26.4 26.8 26.4  MCHC 31.2 31.2 31.3  RDW 15.2 15.1 14.8  PLT 245 270 367    Cardiac EnzymesNo results for input(s): TROPONINI in the last 168 hours. No  results for input(s): TROPIPOC in the last 168 hours.   BNPNo results for input(s): BNP, PROBNP in the last 168 hours.   DDimer No results for input(s): DDIMER in the last 168 hours.   Radiology    No results found.  Cardiac Studies   CARDIAC CATH:     Prox Cx to Mid Cx lesion is 80% stenosed.  Prox LAD lesion is 95% stenosed.  A drug-eluting stent was successfully placed using a STENT SYNERGY DES 3X20.  Post intervention, there is a 0% residual stenosis.  A drug-eluting stent was successfully placed using a STENT SYNERGY DES 2.25X12.  Post intervention, there is a 0% residual stenosis.  Dist RCA lesion is 50% stenosed.  1. Severe proximal LAD stenosis, treated successfully with PCI using a 3.0 x 20 mm Synergy DES 2. Severe mid circumflex stenosis, treated successfully with a 2.25 x 12 mm Synergy DES 3. Moderate distal RCA stenosis, nonobstructive 4. Normal LV function by echo  Recommendations: Patient with paroxysmal atrial  fibrillation. Recommend start oral anticoagulation with apixaban tomorrow morning. Aspirin x30 days only. Clopidogrel times at least 6 months.  Cangrelor is used in the cardiac catheterization lab for IV antiplatelet therapy. The infusion will be continued for a total of 2 hours, then the patient will be loaded with clopidogrel 600 mg per protocol.  Patient Profile     78 y.o. male type 2 diabetes, hypertension, and mixed hyperlipidemia, admitted 08/17/2018 with out of hospital ventricular fibrillation arrest  Assessment & Plan    1. NSTEMI/CAD: Cath noted above. Plan for triple therapy for one month, then stop ASA. Continue BB and statin  2. Afib: on PO amiodarone, transition to 200mg  daily on 5/14. Remains in NSR. Eliquis for stroke risk reduction.   3. VT arrest: felt to be related to ischemia. Normal EF on echo.  4. Acute respiratory failure: s/p extubation. Stable respiratory status.  5. Acute encephalopathy: EEG without seizures. Planned for CIR once medically stable.       For questions or updates, please contact [place] Please consult [email or web address] for contact info under        Signed, Reino Bellis, NP  08/29/2018, 9:39 AM    I have examined the patient and reviewed assessment and plan and discussed with patient.  Agree with above as stated.  DoIng better today. More aware and alert today.  Awaiting rehab placement. Triple therapy as noted above with longer term Plavix therapy.  [name]

## 2018-08-29 NOTE — TOC Progression Note (Signed)
Transition of Care [facility]) - Progression Note    Patient Details  Name: [name] [name] MRN: [id number] Date of Birth: 04-Jul-1940  [facility] CM/SW Contact  Maryclare Labrador, RN Phone Number: 08/29/2018, 2:52 PM  Clinical Narrative:  Pt now transferred to [facility].  CIR Recommended and following - CSW following for SNF as back up plan     Expected Discharge Plan: (vent support/ condition critical)    Expected Discharge Plan and Services Expected Discharge Plan: (vent support/ condition critical)                                               Social Determinants of Health (SDOH) Interventions    Readmission Risk Interventions No flowsheet data found.

## 2018-08-29 NOTE — Progress Notes (Signed)
Paged Dr. Posey Pronto to clarifiy orders for potassium PO and IV ordered. RN awaiting orders.

## 2018-08-29 NOTE — Progress Notes (Signed)
Inpatient Diabetes Program Recommendations  AACE/ADA: New Consensus Statement on Inpatient Glycemic Control (2015)  Target Ranges:  Prepandial:   less than 140 mg/dL      Peak postprandial:   less than 180 mg/dL (1-2 hours)      Critically ill patients:  140 - 180 mg/dL   Lab Results  Component Value Date   GLUCAP 106 (H) 08/29/2018   HGBA1C 8.7 (H) 08/27/2018    Review of Glycemic Control Results for [name] [name], [name] [name] (MRN [id number]) as of 08/29/2018 09:04  Ref. Range 08/28/2018 11:27 08/28/2018 16:56 08/28/2018 19:11 08/28/2018 21:30 08/29/2018 06:15  Glucose-Capillary Latest Ref Range: 70 - 99 mg/dL 213 (H) 278 (H) 269 (H) 184 (H) 106 (H)    Inpatient Diabetes Program Recommendations:    Noted most postprandial CBGs elevated. Please consider Novolog 3 units tid meal coverage if eats 50%  Thank you, Bethena Roys E. Verna Desrocher, RN, MSN, CDE  Diabetes Coordinator Inpatient Glycemic Control Team Team Pager [phone] (8am-5pm) 08/29/2018 9:06 AM

## 2018-08-29 NOTE — Plan of Care (Signed)
  Problem: Cardiac: Goal: Ability to achieve and maintain adequate cardiopulmonary perfusion will improve Outcome: Progressing   Problem: Neurologic: Goal: Promote progressive neurologic recovery Outcome: Progressing   Problem: Education: Goal: Knowledge of General Education information will improve Description Including pain rating scale, medication(s)/side effects and non-pharmacologic comfort measures Outcome: Progressing   Problem: Health Behavior/Discharge Planning: Goal: Ability to manage health-related needs will improve Outcome: Progressing   Problem: Clinical Measurements: Goal: Ability to maintain clinical measurements within normal limits will improve Outcome: Progressing Goal: Will remain free from infection Outcome: Progressing Goal: Diagnostic test results will improve Outcome: Progressing Goal: Respiratory complications will improve Outcome: Progressing

## 2018-08-30 ENCOUNTER — Inpatient Hospital Stay (HOSPITAL_COMMUNITY)
Admission: RE | Admit: 2018-08-30 | Discharge: 2018-09-07 | DRG: 945 | Disposition: A | Discharge status: Home Health | Payer: Medicare PPO | Source: Intra-hospital | Attending: Physical Medicine & Rehabilitation | Admitting: Physical Medicine & Rehabilitation

## 2018-08-30 ENCOUNTER — Inpatient Hospital Stay (HOSPITAL_COMMUNITY): Payer: Medicare PPO

## 2018-08-30 DIAGNOSIS — I2510 Atherosclerotic heart disease of native coronary artery without angina pectoris: Secondary | ICD-10-CM | POA: Diagnosis not present

## 2018-08-30 DIAGNOSIS — E039 Hypothyroidism, unspecified: Secondary | ICD-10-CM

## 2018-08-30 DIAGNOSIS — I10 Essential (primary) hypertension: Secondary | ICD-10-CM | POA: Diagnosis present

## 2018-08-30 DIAGNOSIS — Z9861 Coronary angioplasty status: Secondary | ICD-10-CM

## 2018-08-30 DIAGNOSIS — I480 Paroxysmal atrial fibrillation: Secondary | ICD-10-CM | POA: Diagnosis present

## 2018-08-30 DIAGNOSIS — R1310 Dysphagia, unspecified: Secondary | ICD-10-CM | POA: Diagnosis present

## 2018-08-30 DIAGNOSIS — I5033 Acute on chronic diastolic (congestive) heart failure: Secondary | ICD-10-CM | POA: Diagnosis not present

## 2018-08-30 DIAGNOSIS — Z79890 Hormone replacement therapy (postmenopausal): Secondary | ICD-10-CM

## 2018-08-30 DIAGNOSIS — I5031 Acute diastolic (congestive) heart failure: Secondary | ICD-10-CM

## 2018-08-30 DIAGNOSIS — Z87891 Personal history of nicotine dependence: Secondary | ICD-10-CM | POA: Diagnosis not present

## 2018-08-30 DIAGNOSIS — K449 Diaphragmatic hernia without obstruction or gangrene: Secondary | ICD-10-CM | POA: Diagnosis present

## 2018-08-30 DIAGNOSIS — Z8674 Personal history of sudden cardiac arrest: Secondary | ICD-10-CM

## 2018-08-30 DIAGNOSIS — I4891 Unspecified atrial fibrillation: Secondary | ICD-10-CM | POA: Diagnosis not present

## 2018-08-30 DIAGNOSIS — E1169 Type 2 diabetes mellitus with other specified complication: Secondary | ICD-10-CM

## 2018-08-30 DIAGNOSIS — J690 Pneumonitis due to inhalation of food and vomit: Secondary | ICD-10-CM

## 2018-08-30 DIAGNOSIS — Z7951 Long term (current) use of inhaled steroids: Secondary | ICD-10-CM

## 2018-08-30 DIAGNOSIS — I482 Chronic atrial fibrillation, unspecified: Secondary | ICD-10-CM | POA: Diagnosis present

## 2018-08-30 DIAGNOSIS — Z955 Presence of coronary angioplasty implant and graft: Secondary | ICD-10-CM

## 2018-08-30 DIAGNOSIS — Z794 Long term (current) use of insulin: Secondary | ICD-10-CM

## 2018-08-30 DIAGNOSIS — E785 Hyperlipidemia, unspecified: Secondary | ICD-10-CM | POA: Diagnosis present

## 2018-08-30 DIAGNOSIS — Z7901 Long term (current) use of anticoagulants: Secondary | ICD-10-CM

## 2018-08-30 DIAGNOSIS — Z85828 Personal history of other malignant neoplasm of skin: Secondary | ICD-10-CM | POA: Diagnosis not present

## 2018-08-30 DIAGNOSIS — E559 Vitamin D deficiency, unspecified: Secondary | ICD-10-CM | POA: Diagnosis present

## 2018-08-30 DIAGNOSIS — Z7982 Long term (current) use of aspirin: Secondary | ICD-10-CM | POA: Diagnosis not present

## 2018-08-30 DIAGNOSIS — Z7984 Long term (current) use of oral hypoglycemic drugs: Secondary | ICD-10-CM

## 2018-08-30 DIAGNOSIS — R5381 Other malaise: Principal | ICD-10-CM

## 2018-08-30 DIAGNOSIS — E119 Type 2 diabetes mellitus without complications: Secondary | ICD-10-CM

## 2018-08-30 DIAGNOSIS — I110 Hypertensive heart disease with heart failure: Secondary | ICD-10-CM | POA: Diagnosis present

## 2018-08-30 DIAGNOSIS — K219 Gastro-esophageal reflux disease without esophagitis: Secondary | ICD-10-CM | POA: Diagnosis present

## 2018-08-30 LAB — GLUCOSE, CAPILLARY
Glucose-Capillary: 134 mg/dL — ABNORMAL HIGH (ref 70–99)
Glucose-Capillary: 291 mg/dL — ABNORMAL HIGH (ref 70–99)
Glucose-Capillary: 353 mg/dL — ABNORMAL HIGH (ref 70–99)

## 2018-08-30 LAB — BASIC METABOLIC PANEL
Anion gap: 9 (ref 5–15)
BUN: 23 mg/dL (ref 8–23)
CO2: 24 mmol/L (ref 22–32)
Calcium: 8.7 mg/dL — ABNORMAL LOW (ref 8.9–10.3)
Chloride: 111 mmol/L (ref 98–111)
Creatinine, Ser: 1.06 mg/dL (ref 0.61–1.24)
GFR calc Af Amer: >60 mL/min (ref >60)
GFR calc non Af Amer: >60 mL/min (ref >60)
Glucose, Bld: 150 mg/dL — ABNORMAL HIGH (ref 70–99)
Potassium: 3.6 mmol/L (ref 3.5–5.1)
Sodium: 144 mmol/L (ref 135–145)

## 2018-08-30 LAB — CBC
HCT: 30.1 % — ABNORMAL LOW (ref 39.0–52.0)
Hemoglobin: 9.4 g/dL — ABNORMAL LOW (ref 13.0–17.0)
MCH: 26.5 pg (ref 26.0–34.0)
MCHC: 31.2 g/dL (ref 30.0–36.0)
MCV: 84.8 fL (ref 80.0–100.0)
Platelets: 345 10*3/uL (ref 150–400)
RBC: 3.55 MIL/uL — ABNORMAL LOW (ref 4.22–5.81)
RDW: 15 % (ref 11.5–15.5)
WBC: 8.8 10*3/uL (ref 4.0–10.5)
nRBC: 0 % (ref 0.0–0.2)

## 2018-08-30 LAB — MAGNESIUM: Magnesium: 2.2 mg/dL (ref 1.7–2.4)

## 2018-08-30 MED ORDER — POTASSIUM CHLORIDE CRYS ER 20 MEQ PO TBCR
40.0000 meq | EXTENDED_RELEASE_TABLET | Freq: Every day | ORAL | Status: AC
  Administered 2018-08-31 – 2018-09-01 (×2): 40 meq via ORAL
  Filled 2018-08-30 (×2): qty 2

## 2018-08-30 MED ORDER — METOPROLOL TARTRATE 50 MG PO TABS
50.0000 mg | ORAL_TABLET | Freq: Two times a day (BID) | ORAL | Status: DC
  Administered 2018-08-30 – 2018-09-07 (×16): 50 mg via ORAL
  Filled 2018-08-30 (×16): qty 1

## 2018-08-30 MED ORDER — ASPIRIN 81 MG PO CHEW
81.0000 mg | CHEWABLE_TABLET | Freq: Every day | ORAL | Status: DC
  Administered 2018-08-31 – 2018-09-07 (×8): 81 mg via ORAL
  Filled 2018-08-30 (×8): qty 1

## 2018-08-30 MED ORDER — INSULIN DETEMIR 100 UNIT/ML ~~LOC~~ SOLN
20.0000 [IU] | Freq: Two times a day (BID) | SUBCUTANEOUS | Status: DC
  Administered 2018-08-30 – 2018-09-07 (×16): 20 [IU] via SUBCUTANEOUS
  Filled 2018-08-30 (×18): qty 0.2

## 2018-08-30 MED ORDER — AMIODARONE HCL 400 MG PO TABS: 400.0000 mg | ORAL_TABLET | Freq: Two times a day (BID) | ORAL | Status: DC

## 2018-08-30 MED ORDER — ACETAMINOPHEN 325 MG PO TABS
650.0000 mg | ORAL_TABLET | ORAL | Status: DC | PRN
  Administered 2018-08-31 – 2018-09-01 (×2): 650 mg via ORAL
  Filled 2018-08-30 (×2): qty 2

## 2018-08-30 MED ORDER — CLOPIDOGREL BISULFATE 75 MG PO TABS: 75.0000 mg | ORAL_TABLET | Freq: Every day | ORAL | Status: DC

## 2018-08-30 MED ORDER — INSULIN ASPART 100 UNIT/ML ~~LOC~~ SOLN: 0.0000 [IU] | Freq: Every day | SUBCUTANEOUS | 11 refills | Status: DC

## 2018-08-30 MED ORDER — APIXABAN 5 MG PO TABS: 5.0000 mg | ORAL_TABLET | Freq: Two times a day (BID) | ORAL | Status: DC

## 2018-08-30 MED ORDER — ATORVASTATIN CALCIUM 80 MG PO TABS: 80.0000 mg | ORAL_TABLET | Freq: Every day | ORAL | Status: DC

## 2018-08-30 MED ORDER — SENNA 8.6 MG PO TABS
1.0000 | ORAL_TABLET | Freq: Two times a day (BID) | ORAL | Status: DC
  Administered 2018-08-30 – 2018-09-07 (×16): 8.6 mg via ORAL
  Filled 2018-08-30 (×16): qty 1

## 2018-08-30 MED ORDER — POTASSIUM CHLORIDE CRYS ER 20 MEQ PO TBCR
40.0000 meq | EXTENDED_RELEASE_TABLET | Freq: Every day | ORAL | Status: DC
  Administered 2018-08-30: 11:00:00 40 meq via ORAL

## 2018-08-30 MED ORDER — RESOURCE THICKENUP CLEAR PO POWD
ORAL | Status: DC | PRN
  Filled 2018-08-30: qty 125

## 2018-08-30 MED ORDER — QUETIAPINE FUMARATE 25 MG PO TABS: 25.0000 mg | ORAL_TABLET | Freq: Every day | ORAL | Status: DC

## 2018-08-30 MED ORDER — IRBESARTAN 75 MG PO TABS: 75.0000 mg | ORAL_TABLET | Freq: Every day | ORAL | Status: DC

## 2018-08-30 MED ORDER — LEVOTHYROXINE SODIUM 75 MCG PO TABS
75.0000 ug | ORAL_TABLET | Freq: Every day | ORAL | Status: DC
  Administered 2018-08-31 – 2018-09-07 (×8): 75 ug via ORAL
  Filled 2018-08-30 (×8): qty 1

## 2018-08-30 MED ORDER — QUETIAPINE FUMARATE 25 MG PO TABS
25.0000 mg | ORAL_TABLET | Freq: Every day | ORAL | Status: DC
  Administered 2018-08-30 – 2018-09-06 (×8): 25 mg via ORAL
  Filled 2018-08-30 (×8): qty 1

## 2018-08-30 MED ORDER — FLUTICASONE PROPIONATE 50 MCG/ACT NA SUSP
2.0000 | Freq: Every day | NASAL | Status: DC | PRN
  Administered 2018-09-02 – 2018-09-03 (×2): 2 via NASAL
  Filled 2018-08-30: qty 16

## 2018-08-30 MED ORDER — CLOPIDOGREL BISULFATE 75 MG PO TABS
75.0000 mg | ORAL_TABLET | Freq: Every day | ORAL | Status: DC
  Administered 2018-08-31 – 2018-09-07 (×8): 75 mg via ORAL
  Filled 2018-08-30 (×8): qty 1

## 2018-08-30 MED ORDER — IPRATROPIUM-ALBUTEROL 0.5-2.5 (3) MG/3ML IN SOLN: 3.0000 mL | Freq: Four times a day (QID) | RESPIRATORY_TRACT | Status: DC | PRN

## 2018-08-30 MED ORDER — INSULIN ASPART 100 UNIT/ML ~~LOC~~ SOLN
0.0000 [IU] | Freq: Three times a day (TID) | SUBCUTANEOUS | Status: DC
  Administered 2018-08-31: 3 [IU] via SUBCUTANEOUS
  Administered 2018-08-31: 2 [IU] via SUBCUTANEOUS
  Administered 2018-08-31: 5 [IU] via SUBCUTANEOUS
  Administered 2018-09-01: 3 [IU] via SUBCUTANEOUS
  Administered 2018-09-01: 2 [IU] via SUBCUTANEOUS
  Administered 2018-09-01: 1 [IU] via SUBCUTANEOUS
  Administered 2018-09-02: 2 [IU] via SUBCUTANEOUS
  Administered 2018-09-02: 5 [IU] via SUBCUTANEOUS
  Administered 2018-09-02: 3 [IU] via SUBCUTANEOUS
  Administered 2018-09-03 – 2018-09-05 (×4): 2 [IU] via SUBCUTANEOUS
  Administered 2018-09-05: 5 [IU] via SUBCUTANEOUS
  Administered 2018-09-06: 1 [IU] via SUBCUTANEOUS
  Administered 2018-09-06: 3 [IU] via SUBCUTANEOUS
  Administered 2018-09-07: 1 [IU] via SUBCUTANEOUS

## 2018-08-30 MED ORDER — ATORVASTATIN CALCIUM 80 MG PO TABS
80.0000 mg | ORAL_TABLET | Freq: Every day | ORAL | Status: DC
  Administered 2018-08-30 – 2018-09-06 (×7): 80 mg via ORAL
  Filled 2018-08-30 (×8): qty 1

## 2018-08-30 MED ORDER — IRBESARTAN 75 MG PO TABS
75.0000 mg | ORAL_TABLET | Freq: Every day | ORAL | Status: DC
  Administered 2018-08-31 – 2018-09-06 (×7): 75 mg via ORAL
  Filled 2018-08-30 (×8): qty 1

## 2018-08-30 MED ORDER — INSULIN DETEMIR 100 UNIT/ML ~~LOC~~ SOLN: 20.0000 [IU] | Freq: Two times a day (BID) | SUBCUTANEOUS | 11 refills | Status: DC

## 2018-08-30 MED ORDER — AMIODARONE HCL 200 MG PO TABS
200.0000 mg | ORAL_TABLET | Freq: Every day | ORAL | Status: AC
  Administered 2018-08-31 – 2018-09-06 (×7): 200 mg via ORAL
  Filled 2018-08-30 (×7): qty 1

## 2018-08-30 MED ORDER — AMIODARONE HCL 200 MG PO TABS
400.0000 mg | ORAL_TABLET | Freq: Once | ORAL | Status: AC
  Administered 2018-08-30: 400 mg via ORAL
  Filled 2018-08-30: qty 2

## 2018-08-30 MED ORDER — POTASSIUM CHLORIDE CRYS ER 20 MEQ PO TBCR: 40.0000 meq | EXTENDED_RELEASE_TABLET | Freq: Every day | ORAL | Status: DC

## 2018-08-30 MED ORDER — INSULIN ASPART 100 UNIT/ML ~~LOC~~ SOLN: 0.0000 [IU] | Freq: Three times a day (TID) | SUBCUTANEOUS | 11 refills | Status: DC

## 2018-08-30 MED ORDER — ZOLPIDEM TARTRATE 5 MG PO TABS: 5.0000 mg | ORAL_TABLET | Freq: Every evening | ORAL | 0 refills | Status: DC | PRN

## 2018-08-30 MED ORDER — METOPROLOL TARTRATE 50 MG PO TABS: 50.0000 mg | ORAL_TABLET | Freq: Two times a day (BID) | ORAL | Status: DC

## 2018-08-30 MED ORDER — AMLODIPINE BESYLATE 5 MG PO TABS
5.0000 mg | ORAL_TABLET | Freq: Every day | ORAL | Status: DC
  Administered 2018-08-31 – 2018-09-07 (×8): 5 mg via ORAL
  Filled 2018-08-30 (×8): qty 1

## 2018-08-30 MED ORDER — AMLODIPINE BESYLATE 5 MG PO TABS: 5.0000 mg | ORAL_TABLET | Freq: Every day | ORAL | Status: DC

## 2018-08-30 NOTE — Plan of Care (Signed)
Patient is discharging to in-patient rehab.

## 2018-08-30 NOTE — Progress Notes (Addendum)
Progress Note  Patient Name: [name] [name] Date of Encounter: 08/30/2018  Primary Cardiologist: No primary care provider on file. Dr. Burt Knack  Subjective   No chest pain  Inpatient Medications    Scheduled Meds: . amiodarone  400 mg Oral BID   Followed by  . [START ON 08/31/2018] amiodarone  200 mg Oral Daily  . amLODipine  5 mg Oral Daily  . apixaban  5 mg Oral BID  . aspirin EC  81 mg Oral Daily  . atorvastatin  80 mg Oral q1800  . clopidogrel  75 mg Oral Q breakfast  . insulin aspart  0-5 Units Subcutaneous QHS  . insulin aspart  0-9 Units Subcutaneous TID WC  . insulin detemir  20 Units Subcutaneous BID  . irbesartan  75 mg Oral Daily  . levothyroxine  75 mcg Oral Daily  . mouth rinse  15 mL Mouth Rinse BID  . metoprolol tartrate  50 mg Oral BID  . potassium chloride  40 mEq Oral Daily  . QUEtiapine  25 mg Oral QHS  . senna-docusate  1 tablet Oral BID  . sodium chloride flush  3 mL Intravenous Q12H   Continuous Infusions: . sodium chloride Stopped (08/29/18 0400)  . sodium chloride Stopped (08/28/18 1833)   PRN Meds: sodium chloride, acetaminophen, fluticasone, haloperidol lactate, hydrALAZINE, ipratropium-albuterol, ondansetron (ZOFRAN) IV, Resource ThickenUp Clear, sodium chloride flush, zolpidem   Vital Signs    Vitals:   08/29/18 1928 08/29/18 2340 08/30/18 0317 08/30/18 0815  BP: (!) 119/47 97/72 (!) 143/66 (!) 102/48  Pulse: 82 70 70   Resp: (!) 23 (!) 21 (!) 22   Temp: 97.6 F (36.4 C) 97.6 F (36.4 C) 97.8 F (36.6 C) 97.6 F (36.4 C)  TempSrc: Oral Oral Oral Oral  SpO2: 96% 96% 98%   Weight:      Height:        Intake/Output Summary (Last 24 hours) at 08/30/2018 1047 Last data filed at 08/30/2018 0926 Gross per 24 hour  Intake 338 ml  Output 1950 ml  Net -1612 ml   Last 3 Weights 08/24/2018 08/23/2018 08/22/2018  Weight (lbs) 169 lb 12.1 oz 174 lb 6.1 oz 173 lb 4.5 oz  Weight (kg) 77 kg 79.1 kg 78.6 kg      Telemetry    NSR - Personally  Reviewed  ECG     Physical Exam   GEN: No acute distress.   Neck: No JVD Cardiac: RRR, no murmurs, rubs, or gallops.  Respiratory: Clear to auscultation bilaterally. GI: Soft, nontender, non-distended  MS: No edema; No deformity. Neuro:  Nonfocal , speech is somewhat difficult to understand Psych: Normal affect   Labs    Chemistry Recent Labs  Lab 08/28/18 2006 08/29/18 0244 08/30/18 0156  NA 145 146* 144  K 3.7 3.0* 3.6  CL 115* 116* 111  CO2 23 23 24   GLUCOSE 226* 125* 150*  BUN 18 17 23   CREATININE 1.02 0.88 1.06  CALCIUM 8.8* 8.9 8.7*  GFRNONAA >60 >60 >60  GFRAA >60 >60 >60  ANIONGAP 7 7 9      Hematology Recent Labs  Lab 08/28/18 0334 08/29/18 0244 08/30/18 0156  WBC 9.9 10.9* 8.8  RBC 3.65* 4.06* 3.55*  HGB 9.8* 10.7* 9.4*  HCT 31.4* 34.2* 30.1*  MCV 86.0 84.2 84.8  MCH 26.8 26.4 26.5  MCHC 31.2 31.3 31.2  RDW 15.1 14.8 15.0  PLT 270 367 345    Cardiac EnzymesNo results for input(s): TROPONINI in  the last 168 hours. No results for input(s): TROPIPOC in the last 168 hours.   BNPNo results for input(s): BNP, PROBNP in the last 168 hours.   DDimer No results for input(s): DDIMER in the last 168 hours.   Radiology    No results found.  Cardiac Studies   Cath showed LAD disease  Patient Profile     78 y.o. male CAD, NSTEMI, cardiac arrest  Assessment & Plan    1) COntinue ASA for one month and then plavix and Eliquis.  2) AFib: In NSR. On Amio, ELiquis.  OK to go to rehab from a cardiac standpoint.  Discussed findings with wife.     For questions or updates, please contact [place] Please consult [email or web address] for contact info under        Signed, [name], MD  08/30/2018, 10:47 AM

## 2018-08-30 NOTE — Progress Notes (Signed)
Report given to inpatient rehab nurse.

## 2018-08-30 NOTE — H&P (Signed)
Physical Medicine and Rehabilitation Admission H&P    Chief Complaint  Patient presents with  . Post CPR  Chief complaint: Weakness HPI: [name] [name] is a 78 year old right-handed male with history of hypertension, diabetes mellitus.  Per chart review and some history from patient, patient lives with spouse independent prior to admission.  There is also a son in the area and family plans to provide assistance as needed.  Presented 08/17/2018 after witnessed cardiac arrest.  CPR initiated by bystander.  Per report patient had initially complained of fatigue possibly low blood sugar while trying to eat a muffin before witnessed arrest.  He reportedly struck his head when he collapsed.  He received 11 minutes of CPR.  He was intubated at the scene.  Admit 08/17/2018.  COVID negative.  Work-up revealed leukocytosis with WBCs 18,000 and troponin 0.03.  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  He was extubated 08/23/2018.  Hospital course complicated by A. fib with RVR.  Maintained on aspirin, Plavix as well as Eliquis and amiodarone which has been adjusted with follow-up per cardiology services.  Acute on chronic anemia hemoglobin 9.3-10.7.  Patient did complete a 7-day course of cefepime 08/29/2018 for question aspiration pneumonia.  He is on a dysphagia #2 nectar thick liquid diet.  Patient with ongoing bouts of confusion and restlessness suspect encephalopathy related to cardiac arrest.  An EEG was completed that was negative for seizure.  Therapy evaluations completed and patient was admitted for a comprehensive rehab program.  Review of Systems  Constitutional: Negative for chills and fever.  HENT: Negative for hearing loss.   Eyes: Negative for blurred vision and double vision.  Respiratory: Negative for cough and shortness of breath.   Cardiovascular: Negative for chest pain.  Gastrointestinal: Positive for constipation. Negative for heartburn,  nausea and vomiting.  Genitourinary: Negative for dysuria, flank pain and hematuria.  Musculoskeletal: Positive for myalgias.  Skin: Negative for rash.  Neurological: Positive for weakness.  Psychiatric/Behavioral: The patient has insomnia.   All other systems reviewed and are negative. ?  Reliability  Past Medical History:  Diagnosis Date  . Diabetes mellitus without complication [facility])    Past Surgical History:  Procedure Laterality Date  . CORONARY STENT INTERVENTION N/A 08/25/2018   Procedure: CORONARY STENT INTERVENTION;  Surgeon: Sherren Mocha, MD;  Location: [place] [facility];  Service: Cardiovascular;  Laterality: N/A;  . LEFT HEART CATH AND CORONARY ANGIOGRAPHY N/A 08/25/2018   Procedure: LEFT HEART CATH AND CORONARY ANGIOGRAPHY;  Surgeon: Sherren Mocha, MD;  Location: [place] [facility];  Service: Cardiovascular;  Laterality: N/A;   Family History  Family history unknown: Yes   Social History:  reports that he has quit smoking. He has never used smokeless tobacco. No history on file for alcohol and drug. Allergies: Not on File Medications Prior to Admission  Medication Sig Dispense Refill  . aspirin EC 81 MG tablet Take 81 mg by mouth daily.    . Cinnamon 500 MG capsule Take 500 mg by mouth daily.    . fluticasone (FLONASE) 50 MCG/ACT nasal spray Place 2 sprays into both nostrils daily as needed for allergies.    . Garlic 3545 MG CAPS Take 1,000 mg by mouth daily.    . insulin detemir (LEVEMIR) 100 UNIT/ML injection Inject 50 Units into the skin daily.    . metFORMIN (GLUCOPHAGE) 1000 MG tablet Take 1,000 mg by mouth 2 (two) times daily.    . quinapril (ACCUPRIL) 20  MG tablet Take 20 mg by mouth daily.    Marland Kitchen levothyroxine (SYNTHROID) 75 MCG tablet Take 75 mcg by mouth daily.      Drug Regimen Review Drug regimen was reviewed and remains appropriate with no significant issues identified  Home: Home Living Family/patient expects to be discharged to:: Private residence  Living Arrangements: Spouse/significant other Available Help at Discharge: Family Type of Home: House Bathroom Shower/Tub: Tub/shower unit Home Equipment: Environmental consultant - 2 wheels Additional Comments: Enjoys riding his gollf cart around   Functional History: Prior Function Level of Independence: Independent Comments: ADLs, IADLs, driving  Functional Status:  Mobility: Bed Mobility Overal bed mobility: Needs Assistance Bed Mobility: Supine to Sit Supine to sit: Min assist, HOB elevated Sit to supine: Mod assist, Max assist, +2 for physical assistance General bed mobility comments: In recliner upon arrival Transfers Overall transfer level: Needs assistance Equipment used: Rolling walker (2 wheeled) Transfers: Sit to/from Stand Sit to Stand: Max assist  Lateral/Scoot Transfers: Total assist, +2 physical assistance General transfer comment: Max A to power up into standing from recliner and then prevent posterior lean. Pt requiring increased asssitance to gain balance. Poor correction of balance with cues Ambulation/Gait Ambulation/Gait assistance: Mod assist, Max assist Gait Distance (Feet): 5 Feet Assistive device: Rolling walker (2 wheeled) Gait Pattern/deviations: Step-to pattern General Gait Details: Performed 2x trials ambulating steps forwards/backwards with RW, requiring intermittent mod-maxA to prevent posterior LOB, at times min guard with cues. DOE 3/4 requiring seated rest between trials. SpO2 >89% on RA Gait velocity: Decreased    ADL: ADL Overall ADL's : Needs assistance/impaired Eating/Feeding: Set up, Supervision/ safety, Sitting Eating/Feeding Details (indicate cue type and reason): Pt managing his coffee and placing a straw into the lid. Supervision for safety while pt drank his coffee for safety with mitts off. Grooming: Brushing hair, Oral care, Sitting, Set up, Supervision/safety Grooming Details (indicate cue type and reason): supervision for safety. Cues for  attention. Pt brushing his hair and brushing his teeth while seated in recliner. Upon spitting into the basin during oral care, pt spitting on himself and lap instead of basin.  Upper Body Bathing: Minimal assistance, Sitting Lower Body Bathing: Maximal assistance, Sit to/from stand Upper Body Dressing : Minimal assistance, Sitting Lower Body Dressing: Maximal assistance, Sit to/from stand Toilet Transfer: Maximal assistance(sit<>stand at recliner) Functional mobility during ADLs: Maximal assistance, Rolling walker(sit<>stand) General ADL Comments: Pt presenting with decreased cognition and balance impacting his safety and functional performance  Cognition: Cognition Overall Cognitive Status: Impaired/Different from baseline Orientation Level: Oriented to person, Disoriented to time, Disoriented to situation, Disoriented to place Cognition Arousal/Alertness: Awake/alert Behavior During Therapy: Restless Overall Cognitive Status: Impaired/Different from baseline Area of Impairment: Orientation, Attention, Following commands, Safety/judgement, Awareness, Problem solving Orientation Level: Disoriented to, Time Current Attention Level: Focused Following Commands: Follows one step commands inconsistently Safety/Judgement: Decreased awareness of safety, Decreased awareness of deficits Awareness: Intellectual Problem Solving: Slow processing, Difficulty sequencing, Requires verbal cues, Requires tactile cues General Comments: Pt following simple commands. When asked what day it is, pt able to report it is May 12 but then stated "1920". Pt requiring increased cues and time throughout session. Poor attention and safety awarness.   Physical Exam: Blood pressure (!) 143/66, pulse 70, temperature 97.8 F (36.6 C), temperature source Oral, resp. rate (!) 22, height 5\' 11"  (1.803 m), weight 77 kg, SpO2 98 %. Physical Exam  Vitals reviewed. Constitutional: He appears well-developed and  well-nourished.  HENT:  Head: Normocephalic and atraumatic.  Poor dentition  Eyes: EOM are normal. Right eye exhibits no discharge. Left eye exhibits no discharge.  Respiratory: Effort normal. No respiratory distress.  Wet voice.  GI: He exhibits no distension.  Musculoskeletal:     Comments: No edema or tenderness in extremities  Neurological: He is alert.  Patient is alert.   Oriented to person.   Follows simple commands.   Limited medical historian. Motor: Bilateral upper extremities: 5/5 proximal and distal Bilateral lower extremities: Hip exam, knee extension, 5/5, ankle dorsiflexion 4/5 (?  Participation) Confused  Skin: Skin is warm and dry.  Psychiatric: His speech is slurred. He is slowed. Cognition and memory are impaired.    Results for orders placed or performed during the hospital encounter of 08/17/18 (from the past 48 hour(s))  Glucose, capillary     Status: Abnormal   Collection Time: 08/28/18 11:27 AM  Result Value Ref Range   Glucose-Capillary 213 (H) 70 - 99 mg/dL  Glucose, capillary     Status: Abnormal   Collection Time: 08/28/18  4:56 PM  Result Value Ref Range   Glucose-Capillary 278 (H) 70 - 99 mg/dL  Glucose, capillary     Status: Abnormal   Collection Time: 08/28/18  7:11 PM  Result Value Ref Range   Glucose-Capillary 269 (H) 70 - 99 mg/dL   Comment 1 Notify RN    Comment 2 Document in Chart   Basic metabolic panel     Status: Abnormal   Collection Time: 08/28/18  8:06 PM  Result Value Ref Range   Sodium 145 135 - 145 mmol/L   Potassium 3.7 3.5 - 5.1 mmol/L   Chloride 115 (H) 98 - 111 mmol/L   CO2 23 22 - 32 mmol/L   Glucose, Bld 226 (H) 70 - 99 mg/dL   BUN 18 8 - 23 mg/dL   Creatinine, Ser 1.02 0.61 - 1.24 mg/dL   Calcium 8.8 (L) 8.9 - 10.3 mg/dL   GFR calc non Af Amer >60 >60 mL/min   GFR calc Af Amer >60 >60 mL/min   Anion gap 7 5 - 15    Comment: Performed at [facility], [address], [place], [place] [address]  Glucose,  capillary     Status: Abnormal   Collection Time: 08/28/18  9:30 PM  Result Value Ref Range   Glucose-Capillary 184 (H) 70 - 99 mg/dL   Comment 1 Notify RN    Comment 2 Document in Chart   Basic metabolic panel     Status: Abnormal   Collection Time: 08/29/18  2:44 AM  Result Value Ref Range   Sodium 146 (H) 135 - 145 mmol/L   Potassium 3.0 (L) 3.5 - 5.1 mmol/L   Chloride 116 (H) 98 - 111 mmol/L   CO2 23 22 - 32 mmol/L   Glucose, Bld 125 (H) 70 - 99 mg/dL   BUN 17 8 - 23 mg/dL   Creatinine, Ser 0.88 0.61 - 1.24 mg/dL   Calcium 8.9 8.9 - 10.3 mg/dL   GFR calc non Af Amer >60 >60 mL/min   GFR calc Af Amer >60 >60 mL/min   Anion gap 7 5 - 15    Comment: Performed at [facility], [place] [address] [place] [address]  CBC     Status: Abnormal   Collection Time: 08/29/18  2:44 AM  Result Value Ref Range   WBC 10.9 (H) 4.0 - 10.5 K/uL   RBC 4.06 (L) 4.22 - 5.81 MIL/uL   Hemoglobin  10.7 (L) 13.0 - 17.0 g/dL   HCT 34.2 (L) 39.0 - 52.0 %   MCV 84.2 80.0 - 100.0 fL   MCH 26.4 26.0 - 34.0 pg   MCHC 31.3 30.0 - 36.0 g/dL   RDW 14.8 11.5 - 15.5 %   Platelets 367 150 - 400 K/uL   nRBC 0.0 0.0 - 0.2 %    Comment: Performed at [facility], [place] [address] [place], [place] [address]  Glucose, capillary     Status: Abnormal   Collection Time: 08/29/18  6:15 AM  Result Value Ref Range   Glucose-Capillary 106 (H) 70 - 99 mg/dL   Comment 1 Notify RN    Comment 2 Document in Chart   Glucose, capillary     Status: Abnormal   Collection Time: 08/29/18 10:39 AM  Result Value Ref Range   Glucose-Capillary 157 (H) 70 - 99 mg/dL   Comment 1 Notify RN    Comment 2 Document in Chart   Glucose, capillary     Status: Abnormal   Collection Time: 08/29/18  4:41 PM  Result Value Ref Range   Glucose-Capillary 442 (H) 70 - 99 mg/dL   Comment 1 Notify RN    Comment 2 Document in Chart   Glucose, capillary     Status: Abnormal   Collection Time: 08/29/18  5:36 PM  Result Value Ref  Range   Glucose-Capillary 428 (H) 70 - 99 mg/dL   Comment 1 Notify RN    Comment 2 Document in Chart   Glucose, capillary     Status: Abnormal   Collection Time: 08/29/18  9:09 PM  Result Value Ref Range   Glucose-Capillary 300 (H) 70 - 99 mg/dL   Comment 1 Notify RN    Comment 2 Document in Chart   Basic metabolic panel     Status: Abnormal   Collection Time: 08/30/18  1:56 AM  Result Value Ref Range   Sodium 144 135 - 145 mmol/L   Potassium 3.6 3.5 - 5.1 mmol/L   Chloride 111 98 - 111 mmol/L   CO2 24 22 - 32 mmol/L   Glucose, Bld 150 (H) 70 - 99 mg/dL   BUN 23 8 - 23 mg/dL   Creatinine, Ser 1.06 0.61 - 1.24 mg/dL   Calcium 8.7 (L) 8.9 - 10.3 mg/dL   GFR calc non Af Amer >60 >60 mL/min   GFR calc Af Amer >60 >60 mL/min   Anion gap 9 5 - 15    Comment: Performed at [facility], [place] [address] [place], [place] [address]  CBC     Status: Abnormal   Collection Time: 08/30/18  1:56 AM  Result Value Ref Range   WBC 8.8 4.0 - 10.5 K/uL   RBC 3.55 (L) 4.22 - 5.81 MIL/uL   Hemoglobin 9.4 (L) 13.0 - 17.0 g/dL   HCT 30.1 (L) 39.0 - 52.0 %   MCV 84.8 80.0 - 100.0 fL   MCH 26.5 26.0 - 34.0 pg   MCHC 31.2 30.0 - 36.0 g/dL   RDW 15.0 11.5 - 15.5 %   Platelets 345 150 - 400 K/uL   nRBC 0.0 0.0 - 0.2 %    Comment: Performed at [facility], [place] [address] [place], [place] [address]  Magnesium     Status: None   Collection Time: 08/30/18  1:56 AM  Result Value Ref Range   Magnesium 2.2 1.7 - 2.4 mg/dL    Comment: Performed  at [facility], [place] [address], [place], [place] [address]  Glucose, capillary     Status: Abnormal   Collection Time: 08/30/18  6:20 AM  Result Value Ref Range   Glucose-Capillary 134 (H) 70 - 99 mg/dL   Comment 1 Notify RN    Comment 2 Document in Chart    No results found.     Medical Problem List and Plan: 1.  Debility and acute encephalopathy secondary to cardiac arrest status post stenting/multi-medical  Admit to CIR 2.   Antithrombotics: -DVT/anticoagulation: Eliquis  -antiplatelet therapy: Aspirin 81 mg daily, Plavix 75 mg daily.  Plan currently is for aspirin 30 days and Plavix for at least 6 months 3. Pain Management: Tylenol as needed 4. Mood: Provide emotional support  -antipsychotic agents: Seroquel 25 mg nightly 5. Neuropsych: This patient is not capable of making decisions on his own behalf. 6. Skin/Wound Care: Routine skin checks 7. Fluids/Electrolytes/Nutrition: Routine in and outs.  Follow-up BMP tomorrow a.m. 8.  Acute diastolic congestive heart failure.  Monitor for signs of fluid overload 9.  PAF with RVR.  Continue Eliquis.  Amiodarone adjusted as per cardiology services.  Transition to amiodarone 200 mg daily tomorrow. 10.  Hypertension.  Norvasc 5 mg daily, Avapro 75 mg daily, Lopressor 50 mg twice daily.  Monitor with increased mobility 11.  Diabetes mellitus.  Hemoglobin A1c 8.7.  Levemir 20 units twice daily.  Check blood sugars before meals and at bedtime.  Monitor with increased mobility. 12.  Dysphagia.  Dysphagia #2 nectar liquids.  Follow-up speech therapy.  Advance as tolerated. 13.  Aspiration pneumonia.  IV cefepime completed 08/29/2018.  Chest x-ray on 08/25/2018 reviewed showing bilateral opacities.  Will order repeat chest x-ray. 14.  Hyperlipidemia.  Lipitor 15.  Hypothyroidism.  Synthroid  Lavon Paganini Angiulli, PA-C 08/30/2018

## 2018-08-30 NOTE — Discharge Summary (Signed)
Physician Discharge Summary  MIRKO TAILOR TFT:[id number] DOB: 03-13-1941 DOA: 08/17/2018  PCP: Chipper Herb, MD  Admit date: 08/17/2018 Discharge date: 08/30/2018  Admitted From: Home Disposition: Inpatient rehabilitation  Recommendations for Outpatient Follow-up:  1. Follow up with PCP in 1-2 weeks 2. Please obtain BMP in one week to follow potassium level 3. Follow-up with cardiology as scheduled  Home Health: no Equipment/Devices: None  Discharge Condition: Stable CODE STATUS: Full code Diet recommendation: Heart Healthy / Carb Modified   History of present illness:  Mr. Khim is a 78 year old Caucasian gentleman with past medical history remarkable for  Diabetes who was admitted on 08/17/2018, presented with complaint of cardiac arrest, was found to have V. fib arrest.  Bystander CPR.  EMS/ACLS, patient actually required 2 shocks for V. fib arrest.  Brought to the emergency department in comatose condition.  Underwent therapeutic hypothermia protocol. Cardiology was consulted and S/P cardiac catheterization  Hospital course:  Status post cardiac arrest Ventricular fibrillation cardiac arrest Patient brought in by EMS undergoing to defibrillations for noted ventricular fibrillation arrest.  Unclear etiology, presumed preceded by hypoglycemia.  Was initially comatose on admission, underwent therapeutic cooling protocol on 08/17/2018.  Transthoracic echocardiogram with preserved EF.  Cardiology was consulted and underwent left heart catheterization with PCI.  Acute hypoxic respiratory failure Hafnia alvei pneumonia Patient initially on full vent support on initial presentation.  Respiratory culture remarkable for Hafnia alvei.  Patient received a 7-day course of cefepime in accordance with culture identification and susceptibilities.  Patient was able to be extubated successfully and titrated off of supplemental oxygen.  Coronary artery disease status post  PCI/stent Cardiologist consulted as above and follow during hospital course.  Underwent left heart catheterization and was noted to have a severe proximal LAD stenosis treated successfully with PCI using a 3.0 x 59mm Synergy DES.  There is also severe mid circumflex stenosis treated with 2.25 x 12 mm Synergy DES.  Also noted was a nonobstructive distal RCA.  Plan to continue aspirin for 30 days only and Plavix for at least 6 months per cardiology.  Continue high intensity statin with atorvastatin 80 mg p.o. daily, irbesartan and metoprolol.  Permanent atrial fibrillation with RVR Patient was loaded with amiodarone, currently transitioning from 400 mg p.o. twice daily to 2 mg p.o. daily on 08/31/2018.  On chronic anticoagulation with apixaban.  Hypokalemia We will discharge potassium 40 mEq daily.  Potassium at time of discharge 3.6.  Recommend repeat BMP in 1 week to follow potassium level.  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  EEG negative for any active seizures.  Continue Seroquel nightly.  Type 2 diabetes mellitus Home medications include Levemir 50 units subcutaneously daily, metformin 1000 mg twice daily.  Home insulin was reduced to Levemir 20 units twice daily now that he is diet controlled.  Hemoglobin A1c 8.7, not optimally controlled.  Resume home metformin following discharge.  Continue to monitor and adjust insulin as needed.  Low risk exposure to COVID. SARS-CoV-2 test is negative which was performed on 08/27/2018.  Hypothyroidism: TSH 3.346, free T4 1.15.  Continue home Synthroid.  Discharge Diagnoses:  Active Problems:   CAD S/P percutaneous coronary angioplasty   Atrial fibrillation, chronic   HTN (hypertension)   T2DM (type 2 diabetes mellitus) [facility])    Discharge Instructions  Discharge Instructions    (HEART FAILURE PATIENTS)  Call MD:  Anytime you have any  of the following symptoms: 1) 3 pound weight gain in 24 hours or 5 pounds in 1 week 2) shortness of breath, with or without a dry hacking cough 3) swelling in the hands, feet or stomach 4) if you have to sleep on extra pillows at night in order to breathe.   Complete by:  As directed    Amb Referral to Cardiac Rehabilitation   Complete by:  As directed    Diagnosis:   NSTEMI Coronary Stents     Call MD for:  persistant nausea and vomiting   Complete by:  As directed    Call MD for:  temperature >100.4   Complete by:  As directed    Diet - low sodium heart healthy   Complete by:  As directed    Increase activity slowly   Complete by:  As directed      Allergies as of 08/30/2018   Not on File     Medication List    STOP taking these medications   Cinnamon 500 MG capsule   Garlic 7824 MG Caps   quinapril 20 MG tablet Commonly known as:  ACCUPRIL     TAKE these medications   amiodarone 400 MG tablet Commonly known as:  PACERONE Take 1 tablet (400 mg total) by mouth 2 (two) times daily.   amLODipine 5 MG tablet Commonly known as:  NORVASC Take 1 tablet (5 mg total) by mouth daily. Start taking on:  Aug 31, 2018   apixaban 5 MG Tabs tablet Commonly known as:  ELIQUIS Take 1 tablet (5 mg total) by mouth 2 (two) times daily.   aspirin EC 81 MG tablet Take 81 mg by mouth daily.   atorvastatin 80 MG tablet Commonly known as:  LIPITOR Take 1 tablet (80 mg total) by mouth daily at 6 PM.   clopidogrel 75 MG tablet Commonly known as:  PLAVIX Take 1 tablet (75 mg total) by mouth daily with breakfast. Start taking on:  Aug 31, 2018   fluticasone 50 MCG/ACT nasal spray Commonly known as:  FLONASE Place 2 sprays into both nostrils daily as needed for allergies.   insulin aspart 100 UNIT/ML injection Commonly known as:  novoLOG Inject 0-9 Units into the skin 3 (three) times daily with meals.   insulin aspart 100 UNIT/ML injection Commonly  known as:  novoLOG Inject 0-5 Units into the skin at bedtime.   insulin detemir 100 UNIT/ML injection Commonly known as:  LEVEMIR Inject 0.2 mLs (20 Units total) into the skin 2 (two) times daily. What changed:    how much to take  when to take this   irbesartan 75 MG tablet Commonly known as:  AVAPRO Take 1 tablet (75 mg total) by mouth daily. Start taking on:  Aug 31, 2018   levothyroxine 75 MCG tablet Commonly known as:  SYNTHROID Take 75 mcg by mouth daily.   metFORMIN 1000 MG tablet Commonly known as:  GLUCOPHAGE Take 1,000 mg by mouth 2 (two) times daily.   metoprolol tartrate 50 MG tablet Commonly known as:  LOPRESSOR Take 1 tablet (50 mg total) by mouth 2 (two) times daily.   potassium chloride SA 20 MEQ tablet Commonly known as:  K-DUR Take 2 tablets (40 mEq total) by mouth daily. Start taking on:  Aug 31, 2018   QUEtiapine 25 MG tablet Commonly known as:  SEROQUEL Take 1 tablet (25 mg total) by mouth at bedtime.   zolpidem 5 MG tablet Commonly known as:  AMBIEN Take  1 tablet (5 mg total) by mouth at bedtime as needed for sleep.       Not on File  Consultations:  Cardiology   Procedures/Studies: Dg Chest 1 View  Result Date: 08/19/2018 CLINICAL DATA:  Hypoxemia EXAM: CHEST  1 VIEW COMPARISON:  08/18/2018 FINDINGS: Perihilar and lower lobe airspace opacities, likely edema. Low lung volumes. Small left pleural effusion. Heart is borderline in size. Endotracheal tube and NG tube are unchanged. IMPRESSION: Perihilar and lower lobe opacities, likely edema. Small left effusion. Electronically Signed   By: Rolm Baptise M.D.   On: 08/19/2018 19:50   Dg Abd 1 View  Result Date: 08/21/2018 CLINICAL DATA:  Check feeding catheter placement EXAM: ABDOMEN - 1 VIEW COMPARISON:  None. FINDINGS: Scattered large and small bowel gas is noted. Gastric catheter is noted within the distal stomach/proximal duodenum. The feeding catheter is been placed and lies within the  fourth portion of the duodenum. No free air is seen. No bony abnormality is noted. IMPRESSION: Feeding catheter within the fourth portion of the duodenum. Electronically Signed   By: Inez Catalina M.D.   On: 08/21/2018 16:00   Ct Head Wo Contrast  Result Date: 08/21/2018 CLINICAL DATA:  Unexplained altered level of consciousness. EXAM: CT HEAD WITHOUT CONTRAST TECHNIQUE: Contiguous axial images were obtained from the base of the skull through the vertex without intravenous contrast. COMPARISON:  08/17/2018. FINDINGS: Brain: No evidence for acute infarction, hemorrhage, mass lesion, hydrocephalus, or extra-axial fluid. Generalized atrophy. Hypoattenuation of white matter, likely small vessel disease. Vascular: Calcification of the cavernous internal carotid arteries consistent with cerebrovascular atherosclerotic disease. No signs of intracranial large vessel occlusion. Skull: Normal. Negative for fracture or focal lesion. Sinuses/Orbits: Layering fluid in both divisions of the sphenoid as well as the LEFT maxillary sinus. Other: BILATERAL middle ear and mastoid fluid. Continued endotracheal intubation. IMPRESSION: Atrophy and small vessel disease similar to priors. No acute intracranial findings. Layering sinus fluid as well as BILATERAL middle ear and mastoid fluid. Continued surveillance warranted. Electronically Signed   By: Staci Righter M.D.   On: 08/21/2018 14:33   Ct Head Wo Contrast  Result Date: 08/17/2018 CLINICAL DATA:  Head injury, post CPR, unresponsive EXAM: CT HEAD WITHOUT CONTRAST CT CERVICAL SPINE WITHOUT CONTRAST TECHNIQUE: Multidetector CT imaging of the head and cervical spine was performed following the standard protocol without intravenous contrast. Multiplanar CT image reconstructions of the cervical spine were also generated. COMPARISON:  None. FINDINGS: CT HEAD FINDINGS Brain: No evidence of acute infarction, hemorrhage, hydrocephalus, extra-axial collection or mass lesion/mass effect.  Periventricular white matter hypodensity. Vascular: No hyperdense vessel or unexpected calcification. Skull: Normal. Negative for fracture or focal lesion. Sinuses/Orbits: No acute finding. Other: None. CT CERVICAL SPINE FINDINGS Alignment: Normal. Skull base and vertebrae: No acute fracture. No primary bone lesion or focal pathologic process. Soft tissues and spinal canal: No prevertebral fluid or swelling. No visible canal hematoma. Disc levels: Generally mild multilevel disc degenerative disease and osteophytosis. Upper chest: Negative. Other: Partially imaged endotracheal and orogastric tubes. IMPRESSION: 1. No acute intracranial pathology. Small-vessel white matter disease. 2.  No fracture or static subluxation of the cervical spine. Electronically Signed   By: Eddie Candle M.D.   On: 08/17/2018 16:51   Ct Cervical Spine Wo Contrast  Result Date: 08/17/2018 CLINICAL DATA:  Head injury, post CPR, unresponsive EXAM: CT HEAD WITHOUT CONTRAST CT CERVICAL SPINE WITHOUT CONTRAST TECHNIQUE: Multidetector CT imaging of the head and cervical spine was performed following the standard protocol without  intravenous contrast. Multiplanar CT image reconstructions of the cervical spine were also generated. COMPARISON:  None. FINDINGS: CT HEAD FINDINGS Brain: No evidence of acute infarction, hemorrhage, hydrocephalus, extra-axial collection or mass lesion/mass effect. Periventricular white matter hypodensity. Vascular: No hyperdense vessel or unexpected calcification. Skull: Normal. Negative for fracture or focal lesion. Sinuses/Orbits: No acute finding. Other: None. CT CERVICAL SPINE FINDINGS Alignment: Normal. Skull base and vertebrae: No acute fracture. No primary bone lesion or focal pathologic process. Soft tissues and spinal canal: No prevertebral fluid or swelling. No visible canal hematoma. Disc levels: Generally mild multilevel disc degenerative disease and osteophytosis. Upper chest: Negative. Other: Partially  imaged endotracheal and orogastric tubes. IMPRESSION: 1. No acute intracranial pathology. Small-vessel white matter disease. 2.  No fracture or static subluxation of the cervical spine. Electronically Signed   By: Eddie Candle M.D.   On: 08/17/2018 16:51   Dg Chest Port 1 View  Result Date: 08/25/2018 CLINICAL DATA:  Patient admitted 08/17/2018 with respiratory failure and acute encephalopathy. EXAM: PORTABLE CHEST 1 VIEW COMPARISON:  Single-view of the chest 08/18/2018 and 08/19/2018. FINDINGS: ETT and NG tube have been removed. Right PICC remains in place. Lung volumes are low. Bilateral airspace disease has worsened. Lung volumes are low. Heart size is normal. No pneumothorax. Small left pleural effusion. IMPRESSION: Status post extubation and NG tube removal. Increased basilar airspace opacities could be due to worsened atelectasis although progressive pneumonia and/or edema could create a similar appearance. Electronically Signed   By: Inge Rise M.D.   On: 08/25/2018 08:47   Dg Chest Port 1 View  Result Date: 08/18/2018 CLINICAL DATA:  Cardiac arrest and respiratory failure. EXAM: PORTABLE CHEST 1 VIEW COMPARISON:  08/17/2018 FINDINGS: Endotracheal tube remains with the tip approximately 2 cm above the carina. Gastric decompression tube extends into the stomach. Lungs show improved aeration on the right with mild subsegmental atelectasis an a perihilar location. There is slight increase in left lower lobe atelectasis with potential component of a small left pleural effusion. No pulmonary edema or pneumothorax identified. No visible bony fractures. IMPRESSION: Improved aeration of the right lung. Increase in left lower lobe atelectasis with potential small left pleural effusion. Electronically Signed   By: Aletta Edouard M.D.   On: 08/18/2018 07:40   Dg Chest Port 1 View  Result Date: 08/17/2018 CLINICAL DATA:  Intubation EXAM: PORTABLE CHEST 1 VIEW COMPARISON:  Portable exam 1545 hours without  priors for comparison FINDINGS: Tip of endotracheal tube projects 3.4 cm above carina. Nasogastric tube extends into stomach. Normal heart size, mediastinal contours, and pulmonary vascularity. Atherosclerotic calcification aorta. Subsegmental atelectasis in RIGHT upper lobe and at LEFT base. Additional perihilar and LEFT lower lobe infiltrates. No pleural effusion or pneumothorax. IMPRESSION: BILATERAL pulmonary infiltrates and scattered atelectasis as above. Electronically Signed   By: Lavonia Dana M.D.   On: 08/17/2018 16:30   [place] Ekg Site Rite  Result Date: 08/17/2018 If Site Rite image not attached, placement could not be confirmed due to current cardiac rhythm.   Transthoracic echocardiogram 08/19/2018: IMPRESSIONS    1. The left ventricle has normal systolic function, with an ejection fraction of 55-60%. The cavity size was normal. There is mildly increased left ventricular wall thickness. Left ventricular diastolic Doppler parameters are consistent with impaired  relaxation.  2. The right ventricle has normal systolic function. The cavity was normal. There is no increase in right ventricular wall thickness.  3. The mitral valve is abnormal. Mild thickening of the mitral valve leaflet.  4. The aortic valve is tricuspid. Mild thickening of the aortic valve. Mild calcification of the aortic valve.  Left heart catheterization 08/25/2018:  Prox Cx to Mid Cx lesion is 80% stenosed.  Prox LAD lesion is 95% stenosed.  A drug-eluting stent was successfully placed using a STENT SYNERGY DES 3X20.  Post intervention, there is a 0% residual stenosis.  A drug-eluting stent was successfully placed using a STENT SYNERGY DES 2.25X12.  Post intervention, there is a 0% residual stenosis.  Dist RCA lesion is 50% stenosed.   1.  Severe proximal LAD stenosis, treated successfully with PCI using a 3.0 x 20 mm Synergy DES 2.  Severe mid circumflex stenosis, treated successfully with a 2.25 x 12 mm  Synergy DES 3.  Moderate distal RCA stenosis, nonobstructive 4.  Normal LV function by echo  Recommendations: Patient with paroxysmal atrial fibrillation.  Recommend start oral anticoagulation with apixaban.  Aspirin x30 days only.  Clopidogrel times at least 6 months.   Subjective:   Discharge Exam: Vitals:   08/30/18 1113 08/30/18 1114  BP: 122/62   Pulse: 80   Resp: 17   Temp:  97.7 F (36.5 C)  SpO2: 100%    Vitals:   08/30/18 0815 08/30/18 1058 08/30/18 1113 08/30/18 1114  BP: (!) 102/48 114/68 122/62   Pulse:   80   Resp:   17   Temp: 97.6 F (36.4 C)   97.7 F (36.5 C)  TempSrc: Oral   Oral  SpO2:   100%   Weight:      Height:        General: Pt is alert, awake, not in acute distress Cardiovascular: RRR, S1/S2 +, no rubs, no gallops Respiratory: CTA bilaterally, no wheezing, no rhonchi Abdominal: Soft, NT, ND, bowel sounds + Extremities: no edema, no cyanosis    The results of significant diagnostics from this hospitalization (including imaging, microbiology, ancillary and laboratory) are listed below for reference.     Microbiology: Recent Results (from the past 240 hour(s))  Culture, blood (routine x 2)     Status: None   Collection Time: 08/20/18  1:20 PM  Result Value Ref Range Status   Specimen Description BLOOD LEFT HAND  Final   Special Requests   Final    BOTTLES DRAWN AEROBIC AND ANAEROBIC Blood Culture results may not be optimal due to an inadequate volume of blood received in culture bottles   Culture   Final    NO GROWTH 5 DAYS Performed at [facility], [place] [address] [place], [place] [address]    Report Status 08/25/2018 FINAL  Final  Culture, blood (routine x 2)     Status: None   Collection Time: 08/20/18  1:48 PM  Result Value Ref Range Status   Specimen Description BLOOD LEFT HAND  Final   Special Requests   Final    BOTTLES DRAWN AEROBIC AND ANAEROBIC Blood Culture results may not be optimal due to an inadequate volume  of blood received in culture bottles   Culture   Final    NO GROWTH 5 DAYS Performed at [facility], [place] [address], [place], [place] [address]    Report Status 08/25/2018 FINAL  Final  SARS Coronavirus 2 (CEPHEID - Performed in [place] hospital lab), Hosp Order     Status: None   Collection Time: 08/27/18 12:00 PM  Result Value Ref Range Status   SARS Coronavirus 2 NEGATIVE NEGATIVE Final    Comment: (NOTE) If  result is NEGATIVE SARS-CoV-2 target nucleic acids are NOT DETECTED. The SARS-CoV-2 RNA is generally detectable in upper and lower  respiratory specimens during the acute phase of infection. The lowest  concentration of SARS-CoV-2 viral copies this assay can detect is 250  copies / mL. A negative result does not preclude SARS-CoV-2 infection  and should not be used as the sole basis for treatment or other  patient management decisions.  A negative result may occur with  improper specimen collection / handling, submission of specimen other  than nasopharyngeal swab, presence of viral mutation(s) within the  areas targeted by this assay, and inadequate number of viral copies  (<250 copies / mL). A negative result must be combined with clinical  observations, patient history, and epidemiological information. If result is POSITIVE SARS-CoV-2 target nucleic acids are DETECTED. The SARS-CoV-2 RNA is generally detectable in upper and lower  respiratory specimens dur ing the acute phase of infection.  Positive  results are indicative of active infection with SARS-CoV-2.  Clinical  correlation with patient history and other diagnostic information is  necessary to determine patient infection status.  Positive results do  not rule out bacterial infection or co-infection with other viruses. If result is PRESUMPTIVE POSTIVE SARS-CoV-2 nucleic acids MAY BE PRESENT.   A presumptive positive result was obtained on the submitted specimen  and confirmed on repeat testing.  While  2019 novel coronavirus  (SARS-CoV-2) nucleic acids may be present in the submitted sample  additional confirmatory testing may be necessary for epidemiological  and / or clinical management purposes  to differentiate between  SARS-CoV-2 and other Sarbecovirus currently known to infect humans.  If clinically indicated additional testing with an alternate test  methodology [id number]) is advised. The SARS-CoV-2 RNA is generally  detectable in upper and lower respiratory sp ecimens during the acute  phase of infection. The expected result is Negative. Fact Sheet for Patients:  StrictlyIdeas.no Fact Sheet for Healthcare Providers: BankingDealers.co.za This test is not yet approved or cleared by the [place] FDA and has been authorized for detection and/or diagnosis of SARS-CoV-2 by FDA under an Emergency Use Authorization (EUA).  This EUA will remain in effect (meaning this test can be used) for the duration of the COVID-19 declaration under Section 564(b)(1) of the Act, 21 U.S.C. section 360bbb-3(b)(1), unless the authorization is terminated or revoked sooner. Performed at [facility], [place] [address] [place], [place] [address]      Labs: BNP (last 3 results) No results for input(s): BNP in the last 8760 hours. Basic Metabolic Panel: Recent Labs  Lab 08/23/18 2119  08/25/18 1155  08/27/18 0350 08/28/18 0334 08/28/18 2006 08/29/18 0244 08/30/18 0156  NA  --    < > 148*   < > 146* 145 145 146* 144  K  --    < > 3.3*   < > 3.4* 3.3* 3.7 3.0* 3.6  CL  --    < > 116*   < > 116* 112* 115* 116* 111  CO2  --    < > 22   < > 21* 21* 23 23 24   GLUCOSE  --    < > 184*   < > 217* 178* 226* 125* 150*  BUN  --    < > 17   < > 19 16 18 17 23   CREATININE  --    < > 0.87   < > 0.83 0.78 1.02 0.88 1.06  CALCIUM  --    < >  8.5*   < > 8.5* 8.5* 8.8* 8.9 8.7*  MG 1.9  --  1.8  --  2.0  --   --   --  2.2   < > = values in this interval  not displayed.   Liver Function Tests: No results for input(s): AST, ALT, ALKPHOS, BILITOT, PROT, ALBUMIN in the last 168 hours. No results for input(s): LIPASE, AMYLASE in the last 168 hours. Recent Labs  Lab 08/26/18 1031  AMMONIA 43*   CBC: Recent Labs  Lab 08/26/18 0219 08/27/18 0350 08/28/18 0334 08/29/18 0244 08/30/18 0156  WBC 10.7* 9.5 9.9 10.9* 8.8  HGB 9.8* 9.3* 9.8* 10.7* 9.4*  HCT 31.0* 29.8* 31.4* 34.2* 30.1*  MCV 83.8 84.7 86.0 84.2 84.8  PLT 236 245 270 367 345   Cardiac Enzymes: No results for input(s): CKTOTAL, CKMB, CKMBINDEX, TROPONINI in the last 168 hours. BNP: Invalid input(s): POCBNP CBG: Recent Labs  Lab 08/29/18 1641 08/29/18 1736 08/29/18 2109 08/30/18 0620 08/30/18 1132  GLUCAP 442* 428* 300* 134* 291*   D-Dimer No results for input(s): DDIMER in the last 72 hours. Hgb A1c No results for input(s): HGBA1C in the last 72 hours. Lipid Profile No results for input(s): CHOL, HDL, LDLCALC, TRIG, CHOLHDL, LDLDIRECT in the last 72 hours. Thyroid function studies No results for input(s): TSH, T4TOTAL, T3FREE, THYROIDAB in the last 72 hours.  Invalid input(s): FREET3 Anemia work up No results for input(s): VITAMINB12, FOLATE, FERRITIN, TIBC, IRON, RETICCTPCT in the last 72 hours. Urinalysis No results found for: COLORURINE, APPEARANCEUR, [place], [place], [place], [place], [place], [place], PROTEINUR, UROBILINOGEN, NITRITE, LEUKOCYTESUR Sepsis Labs Invalid input(s): PROCALCITONIN,  WBC,  LACTICIDVEN Microbiology Recent Results (from the past 240 hour(s))  Culture, blood (routine x 2)     Status: None   Collection Time: 08/20/18  1:20 PM  Result Value Ref Range Status   Specimen Description BLOOD LEFT HAND  Final   Special Requests   Final    BOTTLES DRAWN AEROBIC AND ANAEROBIC Blood Culture results may not be optimal due to an inadequate volume of blood received in culture bottles   Culture   Final    NO GROWTH 5 DAYS Performed at  [facility], [place] [address] [place], [place] [address]    Report Status 08/25/2018 FINAL  Final  Culture, blood (routine x 2)     Status: None   Collection Time: 08/20/18  1:48 PM  Result Value Ref Range Status   Specimen Description BLOOD LEFT HAND  Final   Special Requests   Final    BOTTLES DRAWN AEROBIC AND ANAEROBIC Blood Culture results may not be optimal due to an inadequate volume of blood received in culture bottles   Culture   Final    NO GROWTH 5 DAYS Performed at [place] [facility], [place] [address] [place], [place] [address]    Report Status 08/25/2018 FINAL  Final  SARS Coronavirus 2 (CEPHEID - Performed in [place] hospital lab), Hosp Order     Status: None   Collection Time: 08/27/18 12:00 PM  Result Value Ref Range Status   SARS Coronavirus 2 NEGATIVE NEGATIVE Final    Comment: (NOTE) If result is NEGATIVE SARS-CoV-2 target nucleic acids are NOT DETECTED. The SARS-CoV-2 RNA is generally detectable in upper and lower  respiratory specimens during the acute phase of infection. The lowest  concentration of SARS-CoV-2 viral copies this assay can detect is 250  copies / mL. A negative result does not preclude SARS-CoV-2  infection  and should not be used as the sole basis for treatment or other  patient management decisions.  A negative result may occur with  improper specimen collection / handling, submission of specimen other  than nasopharyngeal swab, presence of viral mutation(s) within the  areas targeted by this assay, and inadequate number of viral copies  (<250 copies / mL). A negative result must be combined with clinical  observations, patient history, and epidemiological information. If result is POSITIVE SARS-CoV-2 target nucleic acids are DETECTED. The SARS-CoV-2 RNA is generally detectable in upper and lower  respiratory specimens dur ing the acute phase of infection.  Positive  results are indicative of active infection with SARS-CoV-2.   Clinical  correlation with patient history and other diagnostic information is  necessary to determine patient infection status.  Positive results do  not rule out bacterial infection or co-infection with other viruses. If result is PRESUMPTIVE POSTIVE SARS-CoV-2 nucleic acids MAY BE PRESENT.   A presumptive positive result was obtained on the submitted specimen  and confirmed on repeat testing.  While 2019 novel coronavirus  (SARS-CoV-2) nucleic acids may be present in the submitted sample  additional confirmatory testing may be necessary for epidemiological  and / or clinical management purposes  to differentiate between  SARS-CoV-2 and other Sarbecovirus currently known to infect humans.  If clinically indicated additional testing with an alternate test  methodology [phone]) is advised. The SARS-CoV-2 RNA is generally  detectable in upper and lower respiratory sp ecimens during the acute  phase of infection. The expected result is Negative. Fact Sheet for Patients:  StrictlyIdeas.no Fact Sheet for Healthcare Providers: BankingDealers.co.za This test is not yet approved or cleared by the [place] FDA and has been authorized for detection and/or diagnosis of SARS-CoV-2 by FDA under an Emergency Use Authorization (EUA).  This EUA will remain in effect (meaning this test can be used) for the duration of the COVID-19 declaration under Section 564(b)(1) of the Act, 21 U.S.C. section 360bbb-3(b)(1), unless the authorization is terminated or revoked sooner. Performed at [facility], [place] [address], [place], [place] [address]      Time coordinating discharge: Over 30 minutes  SIGNED:    J [place] DO  [facility] 08/30/2018, 12:06 PM

## 2018-08-30 NOTE — Progress Notes (Signed)
Patient arrived and oriented to unit. No complaints of pain. Oriented to unit activities. Resting comfortably with call bell in place.

## 2018-08-30 NOTE — Evaluation (Signed)
Occupational Therapy Assessment and Plan  Patient Details  Name: [name] [name] MRN: [id number] Date of Birth: [birth year]  OT Diagnosis: cognitive deficits and muscle weakness (generalized) Rehab Potential: Rehab Potential (ACUTE ONLY): Good ELOS: 14-18 days   Today's Date: 08/31/2018 OT Individual Time: 6945-0388 OT Individual Time Calculation (min): 69 min     Problem List:  Patient Active Problem List   Diagnosis Date Noted  . Debility 08/30/2018  . CAD S/P percutaneous coronary angioplasty 08/30/2018  . Atrial fibrillation, chronic 08/30/2018  . HTN (hypertension) 08/30/2018  . T2DM (type 2 diabetes mellitus) ([place]) 08/30/2018  . Acute diastolic congestive heart failure ([place])   . Aspiration pneumonia of both lungs ([place])   . Atrial fibrillation with rapid ventricular response ([place])   . Diabetes mellitus type 2 in nonobese ([facility])   . Dyslipidemia   . Diabetic retinopathy ([place]) 05/22/2017  . Peripheral vascular insufficiency ([place]) 03/18/2016  . Family history of heart disease 03/18/2016  . Right inguinal hernia 12/13/2013  . Essential hypertension 12/13/2013  . BPH (benign prostatic hyperplasia) 12/13/2013  . Hypothyroidism 08/07/2013  . Vitamin D deficiency 04/02/2013  . Goiter, nontoxic, multinodular   . Kidney stone   . Hyperlipidemia 01/15/2009  . ABNORMAL ELECTROCARDIOGRAM 01/15/2009  . Type 2 diabetes mellitus treated with insulin ([place]) 01/09/2009  . HIATAL HERNIA 01/09/2009  . BASAL CELL CARCINOMA, HX OF 01/09/2009  . DIVERTICULITIS, HX OF 01/09/2009    Past Medical History:  Past Medical History:  Diagnosis Date  . Basal cell carcinoma    left forehead, nose, neck  . Cataract   . Diabetes mellitus   . Diabetes mellitus without complication ([place])   . Diverticulitis   . Erectile dysfunction   . Gastroesophageal reflux disease with hiatal hernia   . Goiter, nontoxic, multinodular    with macrocyst  . Hiatal hernia    With reflex  . Hyperlipidemia   .  Inguinal hernia    right side  . Kidney stone 1980  . Leg fracture    Left  . Liver hemangioma   . Nephrolithiasis   . Vitamin D deficiency    Past Surgical History:  Past Surgical History:  Procedure Laterality Date  . BACK SURGERY  1984  . CATARACT EXTRACTION Bilateral   . CORONARY STENT INTERVENTION N/A 08/25/2018   Procedure: CORONARY STENT INTERVENTION;  Surgeon: Sherren Mocha, MD;  Location: [facility];  Service: Cardiovascular;  Laterality: N/A;  . EYE SURGERY    . LACERATION REPAIR  2008   Dr. Lenon Curt -to hand  . LEFT HEART CATH AND CORONARY ANGIOGRAPHY N/A 08/25/2018   Procedure: LEFT HEART CATH AND CORONARY ANGIOGRAPHY;  Surgeon: Sherren Mocha, MD;  Location: [facility];  Service: Cardiovascular;  Laterality: N/A;  . TIBIA FRACTURE SURGERY     left leg    Assessment & Plan Clinical Impression: Patient is a 78 y.o. year old male with recent admission to the hospital on4/30/2020 after witnessed cardiac arrest. CPR initiated by bystander. Per report patient had initially complained of fatigue possibly low blood sugar while trying to eat a muffin before witnessed arrest. He reportedly struck his head when he collapsed. He received 11 minutes of CPR. He was intubated at the scene. Admit 08/17/2018. COVID negative. Work-up revealed leukocytosis with WBCs 18,000 and troponin 0.03. 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 He was extubated 08/23/2018. Hospital course complicated by A. fib with RVR. Maintained on aspirin, Plavix as well as Eliquis and  amiodarone which has been adjusted with follow-up per cardiology services. Acute on chronic anemia hemoglobin 9.3-10.7. Patient did complete a 7-day course of cefepime 08/29/2018 for question aspiration pneumonia. He is on a dysphagia #2 nectar thick liquid diet. Patient with ongoing bouts of confusion and restlessness suspect encephalopathy related to cardiac arrest.    .  Patient  transferred to CIR on 08/30/2018 .    Patient currently requires max with basic self-care skills secondary to muscle weakness, decreased cardiorespiratoy endurance, impaired timing and sequencing, decreased coordination and decreased motor planning, decreased initiation, decreased attention, decreased awareness, decreased problem solving, decreased safety awareness, decreased memory and delayed processing and decreased sitting balance, decreased standing balance, decreased postural control and decreased balance strategies.  Prior to hospitalization, patient could complete BADL with independent .  Patient will benefit from skilled intervention to increase independence with basic self-care skills prior to discharge home with care partner.  Anticipate patient will require 24 hour supervision and follow up home health.  OT - End of Session Endurance Deficit: Yes Endurance Deficit Description: multiple rest breaks within BADL tasks OT Assessment Rehab Potential (ACUTE ONLY): Good OT Patient demonstrates impairments in the following area(s): Balance;Behavior;Cognition;Endurance;Motor;Safety OT Basic ADL's Functional Problem(s): Eating;Grooming;Bathing;Toileting;Dressing OT Transfers Functional Problem(s): Tub/Shower;Toilet OT Additional Impairment(s): None OT Plan OT Intensity: Minimum of 1-2 x/day, 45 to 90 minutes OT Frequency: 5 out of 7 days OT Duration/Estimated Length of Stay: 14-18 days OT Treatment/Interventions: Balance/vestibular training;Cognitive remediation/compensation;Community reintegration;Discharge planning;DME/adaptive equipment instruction;Functional mobility training;Neuromuscular re-education;Patient/family education;Psychosocial support;Self Care/advanced ADL retraining;Therapeutic Activities;Therapeutic Exercise;UE/LE Strength taining/ROM;UE/LE Coordination activities;Wheelchair propulsion/positioning OT Self Feeding Anticipated Outcome(s): Supervision OT Basic Self-Care  Anticipated Outcome(s): Supervision OT Toileting Anticipated Outcome(s): Supervision OT Bathroom Transfers Anticipated Outcome(s): Supervision OT Recommendation Patient destination: Home Follow Up Recommendations: Home health OT Equipment Recommended: To be determined   Skilled Therapeutic Intervention OT eval completed addressing rehab process, OT purpose, POC, ELOS, and goals.  Pt greeted seated EOB with nursing present assisting pt with urinal. Pt needed max A to maintain sitting balance 2/2 posterior pushing, while RN assisted with urinal placement. Pt also loosing balance to the L when trying to assist with urinal. Pt returned to supine to rest. OT brought pt back to EOB with mod A. Able to maintain bouts of seated balance with Min A and BUE supported, with any increased task demand or removing UE support, required max/total w/ posterior and lateral LOB to the L mostly.  UB bathing/dressing completed seated EOB with Mod/max A overall 2/2 balance deficits. Worked on sit<>stands from EOB with max A and max A to maintain standing 2/2 posterior pushing. Side steps along EOB with max A. OT then used stedy for sit<>stand while LB bathing completed. Improved standing balance in stedy 2/2 cue to lean forward with handlebar. Max/total A LB dressing seated EOB, then able to pull up in stedy with mod A. Pt needed max cues to maintain upright position in stedy and decrease lateral lean to the L. Pt reported max fatigue and was returned to bed. Pt impulsive with decreased body and safety awareness.  Pt left semi-reclined in bed with needs met.   OT Evaluation Precautions/Restrictions  Precautions Precautions: Fall Restrictions Weight Bearing Restrictions: No Pain  denies pain Home Living/Prior Functioning Home Living Family/patient expects to be discharged to:: Private residence Living Arrangements: Spouse/significant other Available Help at Discharge: Family Type of Home: Mobile home Home Access:  Level entry(Pt states "maybe 1/2 step") Home Layout: One level Bathroom Shower/Tub: Chiropodist: Standard Additional  Comments: Enjoys golf IADL History Homemaking Responsibilities: Yes Current License: Yes IADL Comments: Pt was independent PTA, grocery shopping, driving, working in the garden Prior Function Level of Independence: Independent with basic ADLs, Independent with homemaking with ambulation Driving: Yes ADL ADL Grooming: Minimal assistance Upper Body Bathing: Moderate assistance Lower Body Bathing: Maximal assistance Upper Body Dressing: Moderate assistance Lower Body Dressing: Maximal assistance Toileting: Maximal assistance Toilet Transfer: Maximal assistance Tub/Shower Transfer: Unable to assess Vision Baseline Vision/History: Wears glasses Wears Glasses: Reading only Cognition Overall Cognitive Status: Impaired/Different from baseline Arousal/Alertness: Awake/alert Orientation Level: Person;Situation;Place Person: Oriented Place: Oriented Situation: Disoriented Year: 2020 Month: May Day of Week: Correct Memory: Impaired Immediate Memory Recall: Blue;Sock;Bed Memory Recall: Sock;Blue Memory Recall Sock: With Cue Memory Recall Blue: Without Cue Behaviors: Impulsive Safety/Judgment: Impaired Sensation Sensation Light Touch: Appears Intact Coordination Gross Motor Movements are Fluid and Coordinated: No Fine Motor Movements are Fluid and Coordinated: No Coordination and Movement Description: decreased smoothness and acccuracy 2/2 weakness Motor  Motor Motor - Skilled Clinical Observations: Generalized weakness Mobility  Bed Mobility Bed Mobility: Sit to Supine;Supine to Sit Supine to Sit: Moderate Assistance - Patient 50-74% Sit to Supine: Moderate Assistance - Patient 50-74% Transfers Sit to Stand: Maximal Assistance - Patient 25-49% Stand to Sit: Maximal Assistance - Patient 25-49%  Balance Balance Balance Assessed:  Yes Static Sitting Balance Static Sitting - Balance Support: Feet supported;Bilateral upper extremity supported Static Sitting - Level of Assistance: 2: Max assist;3: Mod assist Dynamic Sitting Balance Dynamic Sitting - Balance Support: During functional activity;Feet supported;No upper extremity supported Dynamic Sitting - Level of Assistance: 2: Max assist;1: +1 Total assist Sitting balance - Comments: Able to maintain bouts of seated balance with Min A and BUE supported, with any increased task demand or removing UE support, requires max/total w/ posterior and lateral LOB to the L mostly.  Static Standing Balance Static Standing - Balance Support: Bilateral upper extremity supported;During functional activity Static Standing - Level of Assistance: 2: Max assist Dynamic Standing Balance Dynamic Standing - Balance Support: During functional activity;No upper extremity supported Dynamic Standing - Level of Assistance: 1: +1 Total assist Dynamic Standing - Comments: Standing to pull pants up total A to maintain standing 2/2 postiorer push Extremity/Trunk Assessment RUE Assessment RUE Assessment: Exceptions to [facility] General Strength Comments: 4-/5 overall- generalied weakness LUE Assessment LUE Assessment: Exceptions to [facility] General Strength Comments: 4-/5 overall- generalied weakness    Refer to Care Plan for Long Term Goals  Recommendations for other services: None    Discharge Criteria: Patient will be discharged from OT if patient refuses treatment 3 consecutive times without medical reason, if treatment goals not met, if there is a change in medical status, if patient makes no progress towards goals or if patient is discharged from hospital.  The above assessment, treatment plan, treatment alternatives and goals were discussed and mutually agreed upon: by patient  Valma Cava 08/31/2018, 11:04 AM

## 2018-08-30 NOTE — Progress Notes (Signed)
Inpatient Diabetes Program Recommendations  AACE/ADA: New Consensus Statement on Inpatient Glycemic Control   Target Ranges:  Prepandial:   less than 140 mg/dL      Peak postprandial:   less than 180 mg/dL (1-2 hours)      Critically ill patients:  140 - 180 mg/dL   Results for [name] [name], [name] [name] (MRN [id number]) as of 08/30/2018 09:46  Ref. Range 08/29/2018 06:15 08/29/2018 10:39 08/29/2018 16:41 08/29/2018 17:36 08/29/2018 21:09 08/30/2018 06:20  Glucose-Capillary Latest Ref Range: 70 - 99 mg/dL 106 (H) 157 (H) 442 (H) 428 (H) 300 (H) 134 (H)   Review of Glycemic Control  Diabetes history: DM2 Outpatient Diabetes medications: Levemir 50 units daily, Metformin 1000 mg BID Current orders for Inpatient glycemic control: Levemir 20 units BID, Novolog 0-9 units TID with meals, Novolog 0-5 units QHS  Inpatient Diabetes Program Recommendations:   Insulin - Basal: Noted Levemir increased to 20 units BID on 08/29/18.  Insulin - Meal Coverage: If post prandial glucose is consistently greater than 180 mg/dl, please consider ordering Novolog 4 units TID with meals if patient eats at least 50% of meals.  Thanks, Barnie Alderman, RN, MSN, CDE Diabetes Coordinator Inpatient Diabetes Program [phone] (Team Pager from 8am to 5pm)

## 2018-08-30 NOTE — H&P (Signed)
Physical Medicine and Rehabilitation Admission H&P    Chief Complaint  Patient presents with  . Post CPR  Chief complaint: Weakness HPI: [name] [name]. Vaca is a 78 year old right-handed male with history of hypertension, diabetes mellitus.  Per chart review and some history from patient, patient lives with spouse independent prior to admission.  There is also a son in the area and family plans to provide assistance as needed.  Presented 08/17/2018 after witnessed cardiac arrest.  CPR initiated by bystander.  Per report patient had initially complained of fatigue possibly low blood sugar while trying to eat a muffin before witnessed arrest.  He reportedly struck his head when he collapsed.  He received 11 minutes of CPR.  He was intubated at the scene.  Admit 08/17/2018.  COVID negative.  Work-up revealed leukocytosis with WBCs 18,000 and troponin 0.03.  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  He was extubated 08/23/2018.  Hospital course complicated by A. fib with RVR.  Maintained on aspirin, Plavix as well as Eliquis and amiodarone which has been adjusted with follow-up per cardiology services.  Acute on chronic anemia hemoglobin 9.3-10.7.  Patient did complete a 7-day course of cefepime 08/29/2018 for question aspiration pneumonia.  He is on a dysphagia #2 nectar thick liquid diet.  Patient with ongoing bouts of confusion and restlessness suspect encephalopathy related to cardiac arrest.  An EEG was completed that was negative for seizure.  He was seen by cardiology today and deemed appropriate for CIR, notes reviewed.  Therapy evaluations completed and patient was admitted for a comprehensive rehab program.  Please see preadmission assessment from today as well.  Review of Systems  Constitutional: Negative for chills and fever.  HENT: Negative for hearing loss.   Eyes: Negative for blurred vision and double vision.  Respiratory: Negative for cough  and shortness of breath.   Cardiovascular: Negative for chest pain.  Gastrointestinal: Positive for constipation. Negative for heartburn, nausea and vomiting.  Genitourinary: Negative for dysuria, flank pain and hematuria.  Musculoskeletal: Positive for myalgias.  Skin: Negative for rash.  Neurological: Positive for weakness.  Psychiatric/Behavioral: The patient has insomnia.   All other systems reviewed and are negative. ?  Reliability  Past Medical History:  Diagnosis Date  . Diabetes mellitus without complication [facility])    Past Surgical History:  Procedure Laterality Date  . CORONARY STENT INTERVENTION N/A 08/25/2018   Procedure: CORONARY STENT INTERVENTION;  Surgeon: Sherren Mocha, MD;  Location: [facility];  Service: Cardiovascular;  Laterality: N/A;  . LEFT HEART CATH AND CORONARY ANGIOGRAPHY N/A 08/25/2018   Procedure: LEFT HEART CATH AND CORONARY ANGIOGRAPHY;  Surgeon: Sherren Mocha, MD;  Location: [facility];  Service: Cardiovascular;  Laterality: N/A;   Family History  Family history unknown: Yes   Social History:  reports that he has quit smoking. He has never used smokeless tobacco. No history on file for alcohol and drug. Allergies: Not on File Medications Prior to Admission  Medication Sig Dispense Refill  . aspirin EC 81 MG tablet Take 81 mg by mouth daily.    . Cinnamon 500 MG capsule Take 500 mg by mouth daily.    . fluticasone (FLONASE) 50 MCG/ACT nasal spray Place 2 sprays into both nostrils daily as needed for allergies.    . Garlic 9371 MG CAPS Take 1,000 mg by mouth daily.    . insulin detemir (LEVEMIR) 100 UNIT/ML injection Inject 50 Units into the skin daily.    Marland Kitchen  metFORMIN (GLUCOPHAGE) 1000 MG tablet Take 1,000 mg by mouth 2 (two) times daily.    . quinapril (ACCUPRIL) 20 MG tablet Take 20 mg by mouth daily.    Marland Kitchen levothyroxine (SYNTHROID) 75 MCG tablet Take 75 mcg by mouth daily.      Drug Regimen Review Drug regimen was reviewed and  remains appropriate with no significant issues identified  Home: Home Living Family/patient expects to be discharged to:: Private residence Living Arrangements: Spouse/significant other Available Help at Discharge: Family Type of Home: House Bathroom Shower/Tub: Tub/shower unit Home Equipment: Environmental consultant - 2 wheels Additional Comments: Enjoys riding his gollf cart around   Functional History: Prior Function Level of Independence: Independent Comments: ADLs, IADLs, driving  Functional Status:  Mobility: Bed Mobility Overal bed mobility: Needs Assistance Bed Mobility: Supine to Sit Supine to sit: Min assist, HOB elevated Sit to supine: Mod assist, Max assist, +2 for physical assistance General bed mobility comments: In recliner upon arrival Transfers Overall transfer level: Needs assistance Equipment used: Rolling walker (2 wheeled) Transfers: Sit to/from Stand Sit to Stand: Max assist  Lateral/Scoot Transfers: Total assist, +2 physical assistance General transfer comment: Max A to power up into standing from recliner and then prevent posterior lean. Pt requiring increased asssitance to gain balance. Poor correction of balance with cues Ambulation/Gait Ambulation/Gait assistance: Mod assist, Max assist Gait Distance (Feet): 5 Feet Assistive device: Rolling walker (2 wheeled) Gait Pattern/deviations: Step-to pattern General Gait Details: Performed 2x trials ambulating steps forwards/backwards with RW, requiring intermittent mod-maxA to prevent posterior LOB, at times min guard with cues. DOE 3/4 requiring seated rest between trials. SpO2 >89% on RA Gait velocity: Decreased    ADL: ADL Overall ADL's : Needs assistance/impaired Eating/Feeding: Set up, Supervision/ safety, Sitting Eating/Feeding Details (indicate cue type and reason): Pt managing his coffee and placing a straw into the lid. Supervision for safety while pt drank his coffee for safety with mitts off. Grooming:  Brushing hair, Oral care, Sitting, Set up, Supervision/safety Grooming Details (indicate cue type and reason): supervision for safety. Cues for attention. Pt brushing his hair and brushing his teeth while seated in recliner. Upon spitting into the basin during oral care, pt spitting on himself and lap instead of basin.  Upper Body Bathing: Minimal assistance, Sitting Lower Body Bathing: Maximal assistance, Sit to/from stand Upper Body Dressing : Minimal assistance, Sitting Lower Body Dressing: Maximal assistance, Sit to/from stand Toilet Transfer: Maximal assistance(sit<>stand at recliner) Functional mobility during ADLs: Maximal assistance, Rolling walker(sit<>stand) General ADL Comments: Pt presenting with decreased cognition and balance impacting his safety and functional performance  Cognition: Cognition Overall Cognitive Status: Impaired/Different from baseline Orientation Level: Oriented to person, Disoriented to time, Disoriented to situation, Disoriented to place Cognition Arousal/Alertness: Awake/alert Behavior During Therapy: Restless Overall Cognitive Status: Impaired/Different from baseline Area of Impairment: Orientation, Attention, Following commands, Safety/judgement, Awareness, Problem solving Orientation Level: Disoriented to, Time Current Attention Level: Focused Following Commands: Follows one step commands inconsistently Safety/Judgement: Decreased awareness of safety, Decreased awareness of deficits Awareness: Intellectual Problem Solving: Slow processing, Difficulty sequencing, Requires verbal cues, Requires tactile cues General Comments: Pt following simple commands. When asked what day it is, pt able to report it is May 12 but then stated "1920". Pt requiring increased cues and time throughout session. Poor attention and safety awarness.   Physical Exam: Blood pressure (!) 143/66, pulse 70, temperature 97.8 F (36.6 C), temperature source Oral, resp. rate (!) 22,  height 5\' 11"  (1.803 m), weight 77 kg, SpO2 98 %.  Physical Exam  Vitals reviewed. Constitutional: He appears well-developed and well-nourished.  HENT:  Head: Normocephalic and atraumatic.  Poor dentition  Eyes: EOM are normal. Right eye exhibits no discharge. Left eye exhibits no discharge.  Respiratory: Effort normal. No respiratory distress.  Wet voice.  GI: He exhibits no distension.  Musculoskeletal:     Comments: No edema or tenderness in extremities  Neurological: He is alert.  Patient is alert.   Oriented to person.   Follows simple commands.   Limited medical historian. Motor: Bilateral upper extremities: 5/5 proximal and distal Bilateral lower extremities: Hip exam, knee extension, 5/5, ankle dorsiflexion 4/5 (?  Participation) Confused  Skin: Skin is warm and dry.  Psychiatric: His speech is slurred. He is slowed. Cognition and memory are impaired.    Results for orders placed or performed during the hospital encounter of 08/17/18 (from the past 48 hour(s))  Glucose, capillary     Status: Abnormal   Collection Time: 08/28/18 11:27 AM  Result Value Ref Range   Glucose-Capillary 213 (H) 70 - 99 mg/dL  Glucose, capillary     Status: Abnormal   Collection Time: 08/28/18  4:56 PM  Result Value Ref Range   Glucose-Capillary 278 (H) 70 - 99 mg/dL  Glucose, capillary     Status: Abnormal   Collection Time: 08/28/18  7:11 PM  Result Value Ref Range   Glucose-Capillary 269 (H) 70 - 99 mg/dL   Comment 1 Notify RN    Comment 2 Document in Chart   Basic metabolic panel     Status: Abnormal   Collection Time: 08/28/18  8:06 PM  Result Value Ref Range   Sodium 145 135 - 145 mmol/L   Potassium 3.7 3.5 - 5.1 mmol/L   Chloride 115 (H) 98 - 111 mmol/L   CO2 23 22 - 32 mmol/L   Glucose, Bld 226 (H) 70 - 99 mg/dL   BUN 18 8 - 23 mg/dL   Creatinine, Ser 1.02 0.61 - 1.24 mg/dL   Calcium 8.8 (L) 8.9 - 10.3 mg/dL   GFR calc non Af Amer >60 >60 mL/min   GFR calc Af Amer >60 >60  mL/min   Anion gap 7 5 - 15    Comment: Performed at [facility], [address], [place], [place] [address]  Glucose, capillary     Status: Abnormal   Collection Time: 08/28/18  9:30 PM  Result Value Ref Range   Glucose-Capillary 184 (H) 70 - 99 mg/dL   Comment 1 Notify RN    Comment 2 Document in Chart   Basic metabolic panel     Status: Abnormal   Collection Time: 08/29/18  2:44 AM  Result Value Ref Range   Sodium 146 (H) 135 - 145 mmol/L   Potassium 3.0 (L) 3.5 - 5.1 mmol/L   Chloride 116 (H) 98 - 111 mmol/L   CO2 23 22 - 32 mmol/L   Glucose, Bld 125 (H) 70 - 99 mg/dL   BUN 17 8 - 23 mg/dL   Creatinine, Ser 0.88 0.61 - 1.24 mg/dL   Calcium 8.9 8.9 - 10.3 mg/dL   GFR calc non Af Amer >60 >60 mL/min   GFR calc Af Amer >60 >60 mL/min   Anion gap 7 5 - 15    Comment: Performed at [facility], [place] [address] [place],  [address]  CBC     Status: Abnormal   Collection Time: 08/29/18  2:44 AM  Result Value Ref  Range   WBC 10.9 (H) 4.0 - 10.5 K/uL   RBC 4.06 (L) 4.22 - 5.81 MIL/uL   Hemoglobin 10.7 (L) 13.0 - 17.0 g/dL   HCT 34.2 (L) 39.0 - 52.0 %   MCV 84.2 80.0 - 100.0 fL   MCH 26.4 26.0 - 34.0 pg   MCHC 31.3 30.0 - 36.0 g/dL   RDW 14.8 11.5 - 15.5 %   Platelets 367 150 - 400 K/uL   nRBC 0.0 0.0 - 0.2 %    Comment: Performed at [facility], [place] [address] [place], [place] [address]  Glucose, capillary     Status: Abnormal   Collection Time: 08/29/18  6:15 AM  Result Value Ref Range   Glucose-Capillary 106 (H) 70 - 99 mg/dL   Comment 1 Notify RN    Comment 2 Document in Chart   Glucose, capillary     Status: Abnormal   Collection Time: 08/29/18 10:39 AM  Result Value Ref Range   Glucose-Capillary 157 (H) 70 - 99 mg/dL   Comment 1 Notify RN    Comment 2 Document in Chart   Glucose, capillary     Status: Abnormal   Collection Time: 08/29/18  4:41 PM  Result Value Ref Range   Glucose-Capillary 442 (H) 70 - 99 mg/dL   Comment 1 Notify RN     Comment 2 Document in Chart   Glucose, capillary     Status: Abnormal   Collection Time: 08/29/18  5:36 PM  Result Value Ref Range   Glucose-Capillary 428 (H) 70 - 99 mg/dL   Comment 1 Notify RN    Comment 2 Document in Chart   Glucose, capillary     Status: Abnormal   Collection Time: 08/29/18  9:09 PM  Result Value Ref Range   Glucose-Capillary 300 (H) 70 - 99 mg/dL   Comment 1 Notify RN    Comment 2 Document in Chart   Basic metabolic panel     Status: Abnormal   Collection Time: 08/30/18  1:56 AM  Result Value Ref Range   Sodium 144 135 - 145 mmol/L   Potassium 3.6 3.5 - 5.1 mmol/L   Chloride 111 98 - 111 mmol/L   CO2 24 22 - 32 mmol/L   Glucose, Bld 150 (H) 70 - 99 mg/dL   BUN 23 8 - 23 mg/dL   Creatinine, Ser 1.06 0.61 - 1.24 mg/dL   Calcium 8.7 (L) 8.9 - 10.3 mg/dL   GFR calc non Af Amer >60 >60 mL/min   GFR calc Af Amer >60 >60 mL/min   Anion gap 9 5 - 15    Comment: Performed at [facility], [place] [address] [place], [place] [address]  CBC     Status: Abnormal   Collection Time: 08/30/18  1:56 AM  Result Value Ref Range   WBC 8.8 4.0 - 10.5 K/uL   RBC 3.55 (L) 4.22 - 5.81 MIL/uL   Hemoglobin 9.4 (L) 13.0 - 17.0 g/dL   HCT 30.1 (L) 39.0 - 52.0 %   MCV 84.8 80.0 - 100.0 fL   MCH 26.5 26.0 - 34.0 pg   MCHC 31.2 30.0 - 36.0 g/dL   RDW 15.0 11.5 - 15.5 %   Platelets 345 150 - 400 K/uL   nRBC 0.0 0.0 - 0.2 %    Comment: Performed at [facility], [place] [address], [place], [place] [address]  Magnesium     Status: None   Collection Time:  08/30/18  1:56 AM  Result Value Ref Range   Magnesium 2.2 1.7 - 2.4 mg/dL    Comment: Performed at [facility] [address], [place], [place] [address]  Glucose, capillary     Status: Abnormal   Collection Time: 08/30/18  6:20 AM  Result Value Ref Range   Glucose-Capillary 134 (H) 70 - 99 mg/dL   Comment 1 Notify RN    Comment 2 Document in Chart    No results found.     Medical Problem List and Plan:  1.  Debility and acute encephalopathy secondary to cardiac arrest status post stenting/multi-medical  Admit to CIR 2.  Antithrombotics: -DVT/anticoagulation: Eliquis  -antiplatelet therapy: Aspirin 81 mg daily, Plavix 75 mg daily.  Continue aspirin for 1 month and then Plavix for at least 6 months. 3. Pain Management: Tylenol as needed 4. Mood: Provide emotional support  -antipsychotic agents: Seroquel 25 mg nightly 5. Neuropsych: This patient is not capable of making decisions on his own behalf. 6. Skin/Wound Care: Routine skin checks 7. Fluids/Electrolytes/Nutrition: Routine in and outs.  Follow-up BMP tomorrow a.m. 8.  Acute diastolic congestive heart failure.  Monitor for signs of fluid overload 9.  PAF with RVR.  Continue Eliquis.  Amiodarone adjusted as per cardiology services.  Transition to amiodarone 200 mg daily tomorrow. 10.  Hypertension.  Norvasc 5 mg daily, Avapro 75 mg daily, Lopressor 50 mg twice daily.  Monitor with increased mobility 11.  Diabetes mellitus.  Hemoglobin A1c 8.7.  Levemir 20 units twice daily.  Check blood sugars before meals and at bedtime.  Monitor with increased mobility. 12.  Dysphagia.  Dysphagia #2 nectar liquids.  Follow-up speech therapy.  Advance as tolerated. 13.  Aspiration pneumonia.  IV cefepime completed 08/29/2018.  Chest x-ray on 08/25/2018 reviewed showing bilateral opacities.  Will order repeat chest x-ray. 14.  Hyperlipidemia.  Lipitor 15.  Hypothyroidism.  Synthroid  Post Admission Physician Evaluation: 1. Preadmission assessment reviewed and changes made below. 2. Functional deficits secondary  to debility. 3. Patient is admitted to receive collaborative, interdisciplinary care between the physiatrist, rehab nursing staff, and therapy team. 4. Patient's level of medical complexity and substantial therapy needs in context of that medical necessity cannot be provided at a lesser intensity of care such as a SNF. 5. Patient has experienced  substantial functional loss from his/her baseline which was documented above under the "Functional History" and "Functional Status" headings.  Judging by the patient's diagnosis, physical exam, and functional history, the patient has potential for functional progress which will result in measurable gains while on inpatient rehab.  These gains will be of substantial and practical use upon discharge  in facilitating mobility and self-care at the household level. 6. Physiatrist will provide 24 hour management of medical needs as well as oversight of the therapy plan/treatment and provide guidance as appropriate regarding the interaction of the two. 7. 24 hour rehab nursing will assist with safety, disease management, medication administration and patient education  and help integrate therapy concepts, techniques,education, etc. 8. PT will assess and treat for/with: Lower extremity strength, range of motion, stamina, balance, functional mobility, safety, adaptive techniques and equipment, coping skills, pain control, education. Goals are: Min A. 9. OT will assess and treat for/with: ADL's, functional mobility, safety, upper extremity strength, adaptive techniques and equipment, ego support, and community reintegration.   Goals are: Min A. Therapy may proceed with showering this patient. 10. SLP will assess and treat for/with: Speech, language, cognition.  Goals  are: Supervision. 11. Case Management and Social Worker will assess and treat for psychological issues and discharge planning. 12. Team conference will be held weekly to assess progress toward goals and to determine barriers to discharge. 13. Patient will receive at least 3 hours of therapy per day at least 5 days per week. 14. ELOS: 14-17 days.       15. Prognosis:  good  I have personally performed a face to face diagnostic evaluation, including, but not limited to relevant history and physical exam findings, of this patient and developed relevant  assessment and plan.  Additionally, I have reviewed and concur with the physician assistant's documentation above.  Delice Lesch, MD, ABPMR Lavon Paganini Angiulli, PA-C 08/30/2018

## 2018-08-30 NOTE — Progress Notes (Addendum)
Michel Santee, PT  Rehab Admission Coordinator  Physical Medicine and Rehabilitation  PMR Pre-admission  Signed  Date of Service:  08/30/2018 11:29 AM          Signed         Show:Clear all '[x]' Manual'[x]' Template'[x]' Copied  Added by: '[x]' Michel Santee, PT  '[]' Hover for details PMR Admission Coordinator Pre-Admission Assessment  Patient: [name] [name] is an 78 y.o., male MRN: [id number] DOB: Oct 09, 1940 Height: '5\' 11"'  (180.3 cm) Weight: 77 kg                                                                                                                                                  Insurance Information HMO:     PPO: yes     PCP:      IPA:      80/20:      OTHER:  PRIMARY: [employer]      Policy#: [id number]      Subscriber: patient CM Name: auto-approved online      Phone#:      Fax#:  Pre-Cert#: [id number] auto-approved for admission on 5/13 with updates due to [facility] on 5/19 at [phone]      Employer:  Benefits:  Phone #: [phone]     Name:  Eff. Date: 04/19/2018     Deduct: $500 (not required for services below if in network)      Out of Pocket Max: [phone] (met $20)      Life Max: n/a CIR: $450/day for days 1-4, $0/day for remaining days      SNF: $0/day for first 20 days, $178/day for days 21-100 Outpatient: $20-40 per visit     Co-Pay:  Home Health: 100%      Co-Pay:  DME: 80%     Co-Pay: 20% Providers: in network  SECONDARY:       Policy#:       Subscriber:  CM Name:       Phone#:      Fax#:  Pre-Cert#:       Employer:  Benefits:  Phone #:      Name:  Eff. Date:      Deduct:       Out of Pocket Max:       Life Max:  CIR:       SNF:  Outpatient:      Co-Pay:  Home Health:       Co-Pay:  DME:      Co-Pay:   Medicaid Application Date:       Case Manager:  Disability Application Date:       Case Worker:   The "Data Collection Information Summary" for patients in Inpatient Rehabilitation Facilities with attached "Privacy Act [place] Records" was provided and verbally reviewed with: Family  Emergency Contact Information  Contact Information    Name Relation Home Work [place], dean Son   [phone]   MARCANTHONY, SLEIGHT [phone]  [phone]     Current Medical History  Patient Admitting Diagnosis: cardiac arrest with encephalopathy  History of Present Illness: Elric Tirado. Cicio is a 78 year old right-handed male with history of hypertension, diabetes mellitus.  Presented 08/17/2018 after witnessed cardiac arrest while at [facility]. CPR initiated by bystander. Per report patient had initially complained of fatigue possibly low blood sugar while trying to eat a muffin before witnessed arrest. He reportedly struck his head when he collapsed. He received 11 minutes of CPR. He was intubated at the scene. Admit 08/17/2018 COVID negative. WBC 18,000, troponin 0.03. 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 He was extubated 08/23/2018. 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 Maintained on aspirin, Plavix as well as Eliquis and amiodarone which has been adjusted with follow-up per cardiology services. Acute on chronic anemia hemoglobin 9.3-10.7. Patient did complete a 7-day course of cefepime 08/29/2018 for question aspiration pneumonia. He is on a dysphagia #2 nectar thick liquid diet. Patient with ongoing bouts of confusion and restlessness suspect encephalopathy related to cardiac arrest. An EEG was completed that was negative for seizure.   Past Medical History      Past Medical History:  Diagnosis Date  . Diabetes mellitus without complication ([place])     Family History  Family history is unknown by patient.  Prior Rehab/Hospitalizations:  Has the patient had prior rehab or hospitalizations prior to admission? No  Has the patient had major surgery during 100 days prior to admission? Yes, cardiac catheterization    Current Medications   Current Facility-Administered Medications:  .  0.9 %  sodium chloride infusion, 250 mL, Intravenous, Continuous, Sherren Mocha, MD, Stopped at 08/29/18 0400 .  0.9 %  sodium chloride infusion, 250 mL, Intravenous, PRN, Sherren Mocha, MD, Stopped at 08/28/18 1833 .  acetaminophen (TYLENOL) tablet 650 mg, 650 mg, Oral, Q4H PRN, Sherren Mocha, MD .  amiodarone (PACERONE) tablet 400 mg, 400 mg, Oral, BID, 400 mg at 08/30/18 1058 **FOLLOWED BY** [START ON 08/31/2018] amiodarone (PACERONE) tablet 200 mg, 200 mg, Oral, Daily, Sherren Mocha, MD .  amLODipine (NORVASC) tablet 5 mg, 5 mg, Oral, Daily, Lavina Hamman, MD, 5 mg at 08/30/18 1058 .  apixaban (ELIQUIS) tablet 5 mg, 5 mg, Oral, BID, Sherren Mocha, MD, 5 mg at 08/30/18 1056 .  aspirin EC tablet 81 mg, 81 mg, Oral, Daily, Sherren Mocha, MD, 81 mg at 08/30/18 1056 .  atorvastatin (LIPITOR) tablet 80 mg, 80 mg, Oral, q1800, Sherren Mocha, MD, 80 mg at 08/29/18 1840 .  clopidogrel (PLAVIX) tablet 75 mg, 75 mg, Oral, Q breakfast, Sherren Mocha, MD, 75 mg at 08/30/18 2800 .  fluticasone (FLONASE) 50 MCG/ACT nasal spray 2 spray, 2 spray, Each Nare, Daily PRN, Sherren Mocha, MD .  haloperidol lactate (HALDOL) injection 2 mg, 2 mg, Intravenous, Q6H PRN, Lavina Hamman, MD, 2 mg at 08/29/18 0138 .  hydrALAZINE (APRESOLINE) injection 10 mg, 10 mg, Intravenous, Q4H PRN, Lavina Hamman, MD .  insulin aspart (novoLOG) injection 0-5 Units, 0-5 Units, Subcutaneous, QHS, Lavina Hamman, MD, 3 Units at 08/29/18 2112 .  insulin aspart (novoLOG) injection 0-9 Units, 0-9 Units, Subcutaneous, TID WC, Lavina Hamman, MD, 1 Units at 08/30/18 [phone] .  insulin detemir (LEVEMIR) injection 20 Units, 20 Units, Subcutaneous, BID, Lavina Hamman, MD, 20 Units at 08/30/18 1057 .  ipratropium-albuterol (DUONEB) 0.5-2.5 (3) MG/3ML nebulizer solution 3 mL, 3 mL, Nebulization, Q6H PRN, Sherren Mocha, MD .  irbesartan (AVAPRO) tablet 75  mg, 75 mg, Oral, Daily, Minus Breeding, MD, 75 mg at 08/30/18 1056 .  levothyroxine (SYNTHROID) tablet 75 mcg, 75 mcg, Oral, Daily, Sherren Mocha, MD, 75 mcg at 08/30/18 [id number] .  MEDLINE mouth rinse, 15 mL, Mouth Rinse, BID, Lavina Hamman, MD, 15 mL at 08/30/18 1059 .  metoprolol tartrate (LOPRESSOR) tablet 50 mg, 50 mg, Oral, BID, Lavina Hamman, MD, 50 mg at 08/30/18 1056 .  ondansetron (ZOFRAN) injection 4 mg, 4 mg, Intravenous, Q6H PRN, Sherren Mocha, MD .  potassium chloride SA (K-DUR) CR tablet 40 mEq, 40 mEq, Oral, Daily, [place] Donnamarie Poag, DO, 40 mEq at 08/30/18 1059 .  QUEtiapine (SEROQUEL) tablet 25 mg, 25 mg, Oral, QHS, Lavina Hamman, MD, 25 mg at 08/29/18 2104 .  Resource ThickenUp Clear, , Oral, PRN, Lavina Hamman, MD .  senna-docusate (Senokot-S) tablet 1 tablet, 1 tablet, Oral, BID, Lavina Hamman, MD, 1 tablet at 08/30/18 1056 .  sodium chloride flush (NS) 0.9 % injection 3 mL, 3 mL, Intravenous, Q12H, Sherren Mocha, MD, 3 mL at 08/30/18 1059 .  sodium chloride flush (NS) 0.9 % injection 3 mL, 3 mL, Intravenous, PRN, Sherren Mocha, MD .  zolpidem (AMBIEN) tablet 5 mg, 5 mg, Oral, QHS PRN, Lavina Hamman, MD  Patients Current Diet:     Diet Order                  Diet - low sodium heart healthy         DIET DYS 2 Room service appropriate? Yes; Fluid consistency: Nectar Thick  Diet effective now               Precautions / Restrictions Precautions Precautions: Fall, Other (comment) Precaution Comments: Bilateral soft mitts Restrictions Weight Bearing Restrictions: No   Has the patient had 2 or more falls or a fall with injury in the past year?No  Prior Activity Level Community (5-7x/wk): very active, driving, works in the yard  Prior Functional Level Prior Function Level of Independence: Independent Comments: ADLs, IADLs, driving  Self Care: Did the patient need help bathing, dressing, using the toilet or eating?  Independent   Indoor Mobility: Did the patient need assistance with walking from room to room (with or without device)? Independent  Stairs: Did the patient need assistance with internal or external stairs (with or without device)? Independent  Functional Cognition: Did the patient need help planning regular tasks such as shopping or remembering to take medications? [place] Devices/Equipment: Cane (specify quad or straight) Home Equipment: Walker - 2 wheels  Prior Device Use: Indicate devices/aids used by the patient prior to current illness, exacerbation or injury? None of the above  Current Functional Level Cognition  Overall Cognitive Status: Impaired/Different from baseline Current Attention Level: Focused Orientation Level: Oriented X4 Following Commands: Follows one step commands inconsistently Safety/Judgement: Decreased awareness of safety, Decreased awareness of deficits General Comments: Pt following simple commands. When asked what day it is, pt able to report it is May 12 but then stated "1920". Pt requiring increased cues and time throughout session. Poor attention and safety awarness.     Extremity Assessment (includes Sensation/Coordination)  Upper Extremity Assessment: Generalized weakness  Lower Extremity Assessment: Defer to PT evaluation RLE Deficits / Details: moves spontaneously against gravity.  No MMT due to  pt unable to follow direction.  Pt able to bear weight in standing RLE Coordination: decreased fine motor LLE Deficits / Details: moves spontaneously against gravity.  no MMT due to unable to follow direction. LLE Coordination: decreased fine motor    ADLs  Overall ADL's : Needs assistance/impaired Eating/Feeding: Set up, Supervision/ safety, Sitting Eating/Feeding Details (indicate cue type and reason): Pt managing his coffee and placing a straw into the lid. Supervision for safety while pt drank his  coffee for safety with mitts off. Grooming: Brushing hair, Oral care, Sitting, Set up, Supervision/safety Grooming Details (indicate cue type and reason): supervision for safety. Cues for attention. Pt brushing his hair and brushing his teeth while seated in recliner. Upon spitting into the basin during oral care, pt spitting on himself and lap instead of basin.  Upper Body Bathing: Minimal assistance, Sitting Lower Body Bathing: Maximal assistance, Sit to/from stand Upper Body Dressing : Minimal assistance, Sitting Lower Body Dressing: Maximal assistance, Sit to/from stand Toilet Transfer: Maximal assistance(sit<>stand at recliner) Functional mobility during ADLs: Maximal assistance, Rolling walker(sit<>stand) General ADL Comments: Pt presenting with decreased cognition and balance impacting his safety and functional performance    Mobility  Overal bed mobility: Needs Assistance Bed Mobility: Supine to Sit Supine to sit: Min assist, HOB elevated Sit to supine: Mod assist, Max assist, +2 for physical assistance General bed mobility comments: In recliner upon arrival    Transfers  Overall transfer level: Needs assistance Equipment used: Rolling walker (2 wheeled) Transfers: Sit to/from Stand Sit to Stand: Max assist  Lateral/Scoot Transfers: Total assist, +2 physical assistance General transfer comment: Max A to power up into standing from recliner and then prevent posterior lean. Pt requiring increased asssitance to gain balance. Poor correction of balance with cues    Ambulation / Gait / Stairs / Wheelchair Mobility  Ambulation/Gait Ambulation/Gait assistance: Mod assist, Max assist Gait Distance (Feet): 5 Feet Assistive device: Rolling walker (2 wheeled) Gait Pattern/deviations: Step-to pattern General Gait Details: Performed 2x trials ambulating steps forwards/backwards with RW, requiring intermittent mod-maxA to prevent posterior LOB, at times min guard with cues. DOE 3/4  requiring seated rest between trials. SpO2 >89% on RA Gait velocity: Decreased    Posture / Balance Dynamic Sitting Balance Sitting balance - Comments: Able to maintain bouts of seated balance with min guard, but moving in all directiosn requiring frequents cues to focus on sitting upright. Pt reports "woozy" although BP/vitals stable Balance Overall balance assessment: Needs assistance Sitting-balance support: Single extremity supported, Bilateral upper extremity supported Sitting balance-Leahy Scale: Fair Sitting balance - Comments: Able to maintain bouts of seated balance with min guard, but moving in all directiosn requiring frequents cues to focus on sitting upright. Pt reports "woozy" although BP/vitals stable Standing balance support: Bilateral upper extremity supported Standing balance-Leahy Scale: Poor Standing balance comment: Reliant on UE support and external assist    Special needs/care consideration BiPAP/CPAP no CPM no Continuous Drip IV no Dialysis no        Days n/a Life Vest no Oxygen room air Special Bed no Trach Size no Wound Vac (area) no      Location n/a Skin abrasion to R face, ecchymosis to bilat UEs, LEs, and groin Bowel mgmt: incontinent, last BM 08/28/2018 Bladder mgmt: condom cath Diabetic mgmt yes Behavioral consideration no Chemo/radiation no     Previous Home Environment (from acute therapy documentation) Living Arrangements: Spouse/significant other Available Help at Discharge: Family Type of Home: House Bathroom Shower/Tub: Tub/shower unit Home Care Services:  No Additional Comments: Enjoys riding his gollf cart around  Discharge Living Setting Plans for Discharge Living Setting: Patient's home Type of Home at Discharge: House Discharge Home Layout: One level Discharge Home Access: Stairs to enter Entrance Stairs-Rails: None Entrance Stairs-Number of Steps: 1(3 steps to enter back with B HR) Discharge Bathroom Shower/Tub: Tub/shower  unit Discharge Bathroom Toilet: Standard Discharge Bathroom Accessibility: Yes How Accessible: Accessible via wheelchair, Accessible via walker Does the patient have any problems obtaining your medications?: No  Social/Family/Support Systems Patient Roles: Parent Contact Information: lives with wife, Dorian Pod who is retired and home 24/7, primary contact is his son Scientist, physiological Anticipated Caregiver: Scientist, physiological Anticipated Caregiver's Contact Information: [phone] Caregiver Availability: 24/7(per Scientist, physiological, they will work out whatever assist is needed, whether that [place] providing 24/7 supervision or Scientist, physiological providing physical assist if needed) Discharge Plan Discussed with Primary Caregiver: Yes Is Caregiver In Agreement with Plan?: Yes Does Caregiver/Family have Issues with Lodging/Transportation while Pt is in Rehab?: No   Goals/Additional Needs Patient/Family Goal for Rehab: PT/OT/SLP supervision Expected length of stay: 15-22 days Dietary Needs: D2/nectar Equipment Needs: tbd Additional Information: 1:1 sitter d/c'd on 08/29/2018 Pt/Family Agrees to Admission and willing to participate: Yes Program Orientation Provided & Reviewed with Pt/Caregiver Including Roles  & Responsibilities: Yes   Possible need for SNF placement upon discharge: no   Patient Condition: This patient's medical and functional status has changed since the consult dated: 08/28/2018 in which the Rehabilitation Physician determined and documented that the patient's condition is appropriate for intensive rehabilitative care in an inpatient rehabilitation facility. See "History of Present Illness" (above) for medical update. Functional changes are: pt mod/max with mobility, improving cognition. Patient's medical and functional status update has been discussed with the Rehabilitation physician and patient remains appropriate for inpatient rehabilitation. Will admit to inpatient rehab today.  Preadmission Screen Completed  By:  Michel Santee, PT, DPT 08/30/2018 12:39 PM ______________________________________________________________________   Discussed status with Dr. Posey Pronto on 08/30/18 at 12:39 PM  and received approval for admission today.  Admission Coordinator:  Michel Santee, PT, DPT 12:39 PM Sudie Grumbling 08/30/18          Cosigned by: Jamse Arn, MD at 08/30/2018 12:50 PM  Electronically signed by Michel Santee, PT at 08/30/2018 12:40 PM Electronically signed by Jamse Arn, MD at 08/30/2018 12:50 PM     ED to Hosp-Admission (Discharged) on 08/17/2018       Revision History       Detailed Report

## 2018-08-30 NOTE — Progress Notes (Signed)
Cardiac Rehab Advisory Cardiac Rehab Phase I is not seeing pts face to face at this time due to Covid 19 restrictions. Ambulation is occurring through nursing, PT, and mobility teams. We will help facilitate that process as needed. We are calling pts in their rooms and discussing education. We will then deliver education materials to pts RN for delivery to pt.   Pt has continued to be too confused for education. Pt has educational materials in his room and a CRPII order was placed for [facility]. I called his wife at home and discussed his stent card, Plavix/ASA/Eliquis, increasing his exercise at home based on how he does at CIR, and CRPII. She voiced understanding.  [place], ACSM 4:03 PM 08/30/2018

## 2018-08-30 NOTE — Progress Notes (Signed)
Meredith Staggers, MD  Physician  Physical Medicine and Rehabilitation  Consult Note  Signed  Date of Service:  08/28/2018 6:03 AM          Signed      Expand All Collapse All    Show:Clear all [x] Manual[x] Template[] Copied  Added by: [x] Angiulli, Lavon Paganini, PA-C[x] Meredith Staggers, MD  [] Hover for details      Physical Medicine and Rehabilitation Consult Reason for Consult: Decreased functional mobility Referring Physician: [facility]   HPI: [name] [name] is a 78 y.o. right-handed male with history of hypertension, diabetes mellitus.  Per chart review patient lives with spouse.  Independent prior to admission.  Presented 08/17/2018 after witnessed cardiac arrest at [facility].  CPR initiated by bystander.  Per report patient had initially complained of fatigue possibly low blood sugar was trying to eat a muffin before witnessed arrest.  Patient reportedly struck his head when he collapsed.  He received 11 minutes of CPR.  Admit 08/17/2018 COVID negative.  Intubated at the scene.  EKG showed A. fib with RVR.  Cranial CT scan negative.  WBC 18,000, troponin 0 0.03.  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  Patient was extubated 08/23/2018.  Currently maintained on aspirin, Plavix as well as Eliquis.  Cardiology continues to follow amiodarone has been adjusted.  Dysphasia #2 nectar thick liquid diet.  Patient with ongoing bouts of confusion and restlessness.  Therapy evaluations completed with recommendations of physical medicine rehab consult.   Review of Systems  Unable to perform ROS: Acuity of condition   No past medical history on file. The histories are not reviewed yet. Please review them in the "History" navigator section and refresh this [place]. Family History  Family history unknown: Yes   Social History:  has no history on file for tobacco, alcohol, and drug. Allergies: Not on File       Medications Prior to  Admission  Medication Sig Dispense Refill  . aspirin EC 81 MG tablet Take 81 mg by mouth daily.    . Cinnamon 500 MG capsule Take 500 mg by mouth daily.    . fluticasone (FLONASE) 50 MCG/ACT nasal spray Place 2 sprays into both nostrils daily as needed for allergies.    . Garlic 0254 MG CAPS Take 1,000 mg by mouth daily.    . insulin detemir (LEVEMIR) 100 UNIT/ML injection Inject 50 Units into the skin daily.    . metFORMIN (GLUCOPHAGE) 1000 MG tablet Take 1,000 mg by mouth 2 (two) times daily.    . quinapril (ACCUPRIL) 20 MG tablet Take 20 mg by mouth daily.    Marland Kitchen levothyroxine (SYNTHROID) 75 MCG tablet Take 75 mcg by mouth daily.      Home: Home Living Family/patient expects to be discharged to:: Private residence Living Arrangements: Spouse/significant other Available Help at Discharge: Family Type of Home: House  Functional History: Prior Function Level of Independence: Independent Functional Status:  Mobility: Bed Mobility Overal bed mobility: Needs Assistance Bed Mobility: Supine to Sit, Sit to Supine Supine to sit: Mod assist, Max assist Sit to supine: Mod assist, Max assist General bed mobility comments: assist based on how much pt can focus on task. Transfers Overall transfer level: Needs assistance Transfers: Sit to/from Stand, Lateral/Scoot Transfers Sit to Stand: Max assist  Lateral/Scoot Transfers: Max assist General transfer comment: cues for hand placement, assist for more boost than assisted forward.  Max redirection to task. Ambulation/Gait General Gait Details: pt unable whether weakness or focus  ADL:  Cognition: Cognition Overall Cognitive Status: Impaired/Different from baseline Orientation Level: Disoriented X4 Cognition Arousal/Alertness: Awake/alert Behavior During Therapy: Restless Overall Cognitive Status: Impaired/Different from baseline Area of Impairment: Orientation, Attention, Following commands, Safety/judgement,  Awareness, Problem solving Orientation Level: Place, Time, Situation Current Attention Level: Focused Following Commands: Follows one step commands inconsistently Safety/Judgement: Decreased awareness of safety, Decreased awareness of deficits Awareness: Intellectual Problem Solving: Slow processing, Difficulty sequencing, Requires verbal cues, Requires tactile cues General Comments: delerious with sitter  Blood pressure 139/67, pulse 84, temperature 98.1 F (36.7 C), temperature source Oral, resp. rate 12, height 5\' 11"  (1.803 m), weight 77 kg, SpO2 91 %. Physical Exam  Constitutional: No distress.  Frail appearing  Eyes: Pupils are equal, round, and reactive to light.  Neck: Normal range of motion.  Cardiovascular: Normal rate.  Respiratory: Effort normal.  GI: Soft.  Musculoskeletal:        General: No edema.  Neurological:  Patient is alert and makes eye contact.  He is a bit restless.  Pleasantly confused.  He does provide the name of the hospital.  He stated he ate four doughnuts and made him sick and that is why he is in the hospital.  He does follow commands with limited insight/awareness. Moves all 4's at least 3/5. Senses pain  Skin: He is not diaphoretic.    LabResultsLast24Hours  Results for orders placed or performed during the hospital encounter of 08/17/18 (from the past 24 hour(s))  Glucose, capillary     Status: Abnormal   Collection Time: 08/27/18  6:16 AM  Result Value Ref Range   Glucose-Capillary 180 (H) 70 - 99 mg/dL  Glucose, capillary     Status: Abnormal   Collection Time: 08/27/18 11:03 AM  Result Value Ref Range   Glucose-Capillary 271 (H) 70 - 99 mg/dL   Comment 1 Notify RN    Comment 2 Document in Chart   SARS Coronavirus 2 (CEPHEID - Performed in [place] hospital lab), Hosp Order     Status: None   Collection Time: 08/27/18 12:00 PM  Result Value Ref Range   SARS Coronavirus 2 NEGATIVE NEGATIVE  Glucose, capillary      Status: Abnormal   Collection Time: 08/27/18  4:12 PM  Result Value Ref Range   Glucose-Capillary 206 (H) 70 - 99 mg/dL   Comment 1 Notify RN    Comment 2 Document in Chart   Glucose, capillary     Status: Abnormal   Collection Time: 08/27/18  9:38 PM  Result Value Ref Range   Glucose-Capillary 161 (H) 70 - 99 mg/dL  Basic metabolic panel     Status: Abnormal   Collection Time: 08/28/18  3:34 AM  Result Value Ref Range   Sodium 145 135 - 145 mmol/L   Potassium 3.3 (L) 3.5 - 5.1 mmol/L   Chloride 112 (H) 98 - 111 mmol/L   CO2 21 (L) 22 - 32 mmol/L   Glucose, Bld 178 (H) 70 - 99 mg/dL   BUN 16 8 - 23 mg/dL   Creatinine, Ser 0.78 0.61 - 1.24 mg/dL   Calcium 8.5 (L) 8.9 - 10.3 mg/dL   GFR calc non Af Amer >60 >60 mL/min   GFR calc Af Amer >60 >60 mL/min   Anion gap 12 5 - 15  CBC     Status: Abnormal   Collection Time: 08/28/18  3:34 AM  Result Value Ref Range   WBC 9.9 4.0 - 10.5 K/uL   RBC 3.65 (L) 4.22 -  5.81 MIL/uL   Hemoglobin 9.8 (L) 13.0 - 17.0 g/dL   HCT 31.4 (L) 39.0 - 52.0 %   MCV 86.0 80.0 - 100.0 fL   MCH 26.8 26.0 - 34.0 pg   MCHC 31.2 30.0 - 36.0 g/dL   RDW 15.1 11.5 - 15.5 %   Platelets 270 150 - 400 K/uL   nRBC 0.0 0.0 - 0.2 %  Glucose, capillary     Status: Abnormal   Collection Time: 08/28/18  5:59 AM  Result Value Ref Range   Glucose-Capillary 148 (H) 70 - 99 mg/dL     ImagingResults(Last48hours)  No results found.     Assessment/Plan: Diagnosis: 78 yo male with debility and encephalopathy related to cardiac arrest and multiple medical issues 1. Does the need for close, 24 hr/day medical supervision in concert with the patient's rehab needs make it unreasonable for this patient to be served in a less intensive setting? Yes 2. Co-Morbidities requiring supervision/potential complications: HTN, DM, dysphagia, afib 3. Due to bladder management, bowel management, safety, skin/wound care, disease management,  medication administration, pain management and patient education, does the patient require 24 hr/day rehab nursing? Yes 4. Does the patient require coordinated care of a physician, rehab nurse, PT (1-2 hrs/day, 5 days/week), OT (1-2 hrs/day, 5 days/week) and SLP (1-2 hrs/day, 5 days/week) to address physical and functional deficits in the context of the above medical diagnosis(es)? Yes Addressing deficits in the following areas: balance, endurance, locomotion, strength, transferring, bowel/bladder control, bathing, dressing, feeding, grooming, toileting, cognition, speech, swallowing and psychosocial support 5. Can the patient actively participate in an intensive therapy program of at least 3 hrs of therapy per day at least 5 days per week? Yes 6. The potential for patient to make measurable gains while on inpatient rehab is excellent 7. Anticipated functional outcomes upon discharge from inpatient rehab are supervision  with PT, supervision and min assist with OT, supervision with SLP. 8. Estimated rehab length of stay to reach the above functional goals is: 15-22 days 9. Anticipated D/C setting: Home 10. Anticipated post D/C treatments: Peak therapy 11. Overall Rehab/Functional Prognosis: excellent  RECOMMENDATIONS: This patient's condition is appropriate for continued rehabilitative care in the following setting: CIR Patient has agreed to participate in recommended program. N/A Note that insurance prior authorization may be required for reimbursement for recommended care.  Comment: Rehab Admissions Coordinator to follow up.  Thanks,  Meredith Staggers, MD, Mellody Drown  I have personally performed a face to face diagnostic evaluation of this patient. Additionally, I have examined pertinent labs and radiographic images. I have reviewed and concur with the physician assistant's documentation above.    Cathlyn Parsons, PA-C 08/28/2018        Electronically signed by Meredith Staggers, MD  at 08/28/2018 12:06 PM     ED to Hosp-Admission (Discharged) on 08/17/2018       Revision & Routing History       Detailed Report

## 2018-08-30 NOTE — PMR Pre-admission (Signed)
PMR Admission Coordinator Pre-Admission Assessment  Patient: [name] [name] is an 78 y.o., male MRN: [id number] DOB: 02-28-1941 Height: '5\' 11"'  (180.3 cm) Weight: 77 kg              Insurance Information HMO:     PPO: yes     PCP:      IPA:      80/20:      OTHER:  PRIMARY: [employer]      Policy#: [id number]      Subscriber: patient CM Name: auto-approved online      Phone#:      Fax#:  Pre-Cert#: [id number] auto-approved for admission on 5/13 with updates due to [facility] on 5/19 at [phone]      Employer:  Benefits:  Phone #: [phone]     Name:  Eff. Date: 04/19/2018     Deduct: $500 (not required for services below if in network)      Out of Pocket Max: [phone] (met $20)      Life Max: n/a CIR: $450/day for days 1-4, $0/day for remaining days      SNF: $0/day for first 20 days, $178/day for days 21-100 Outpatient: $20-40 per visit     Co-Pay:  Home Health: 100%      Co-Pay:  DME: 80%     Co-Pay: 20% Providers: in network  SECONDARY:       Policy#:       Subscriber:  CM Name:       Phone#:      Fax#:  Pre-Cert#:       Employer:  Benefits:  Phone #:      Name:  Eff. Date:      Deduct:       Out of Pocket Max:       Life Max:  CIR:       SNF:  Outpatient:      Co-Pay:  Home Health:       Co-Pay:  DME:      Co-Pay:   Medicaid Application Date:       Case Manager:  Disability Application Date:       Case Worker:   The "Data Collection Information Summary" for patients in Inpatient Rehabilitation Facilities with attached "Privacy Act [place] Records" was provided and verbally reviewed with: Family  Emergency Contact Information Contact Information    Name Relation Home Work [name], [name] Son   [phone]   [name] [name], [name] [name] [phone]  [phone]     Current Medical History  Patient Admitting Diagnosis: cardiac arrest with encephalopathy  History of Present Illness: [name] [name] is a 78 year old right-handed male with history of  hypertension, diabetes mellitus.  Presented 08/17/2018 after witnessed cardiac arrest while at [facility].  CPR initiated by bystander.  Per report patient had initially complained of fatigue possibly low blood sugar while trying to eat a muffin before witnessed arrest.  He reportedly struck his head when he collapsed.  He received 11 minutes of CPR.  He was intubated at the scene.  Admit 08/17/2018 COVID negative.  WBC 18,000, troponin 0.03.  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  He was extubated 08/23/2018.  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  Maintained on aspirin, Plavix as well as Eliquis and amiodarone which has been adjusted with follow-up per cardiology services.  Acute on chronic anemia hemoglobin 9.3-10.7.  Patient did complete a 7-day course of cefepime 08/29/2018 for question aspiration pneumonia.  He is on a dysphagia #2 nectar thick liquid diet.  Patient with ongoing bouts of confusion and restlessness suspect encephalopathy related to cardiac arrest.  An EEG was completed that was negative for seizure.   Past Medical History  Past Medical History:  Diagnosis Date  . Diabetes mellitus without complication ([place])     Family History  Family history is unknown by patient.  Prior Rehab/Hospitalizations:  Has the patient had prior rehab or hospitalizations prior to admission? No  Has the patient had major surgery during 100 days prior to admission? No  Current Medications   Current Facility-Administered Medications:  .  0.9 %  sodium chloride infusion, 250 mL, Intravenous, Continuous, Sherren Mocha, MD, Stopped at 08/29/18 0400 .  0.9 %  sodium chloride infusion, 250 mL, Intravenous, PRN, Sherren Mocha, MD, Stopped at 08/28/18 1833 .  acetaminophen (TYLENOL) tablet 650 mg, 650 mg, Oral, Q4H PRN, Sherren Mocha, MD .  amiodarone (PACERONE) tablet 400 mg, 400 mg, Oral, BID, 400 mg at 08/30/18 1058 **FOLLOWED BY** [START ON  08/31/2018] amiodarone (PACERONE) tablet 200 mg, 200 mg, Oral, Daily, Sherren Mocha, MD .  amLODipine (NORVASC) tablet 5 mg, 5 mg, Oral, Daily, Lavina Hamman, MD, 5 mg at 08/30/18 1058 .  apixaban (ELIQUIS) tablet 5 mg, 5 mg, Oral, BID, Sherren Mocha, MD, 5 mg at 08/30/18 1056 .  aspirin EC tablet 81 mg, 81 mg, Oral, Daily, Sherren Mocha, MD, 81 mg at 08/30/18 1056 .  atorvastatin (LIPITOR) tablet 80 mg, 80 mg, Oral, q1800, Sherren Mocha, MD, 80 mg at 08/29/18 1840 .  clopidogrel (PLAVIX) tablet 75 mg, 75 mg, Oral, Q breakfast, Sherren Mocha, MD, 75 mg at 08/30/18 7564 .  fluticasone (FLONASE) 50 MCG/ACT nasal spray 2 spray, 2 spray, Each Nare, Daily PRN, Sherren Mocha, MD .  haloperidol lactate (HALDOL) injection 2 mg, 2 mg, Intravenous, Q6H PRN, Lavina Hamman, MD, 2 mg at 08/29/18 0138 .  hydrALAZINE (APRESOLINE) injection 10 mg, 10 mg, Intravenous, Q4H PRN, Lavina Hamman, MD .  insulin aspart (novoLOG) injection 0-5 Units, 0-5 Units, Subcutaneous, QHS, Lavina Hamman, MD, 3 Units at 08/29/18 2112 .  insulin aspart (novoLOG) injection 0-9 Units, 0-9 Units, Subcutaneous, TID WC, Lavina Hamman, MD, 1 Units at 08/30/18 [phone] .  insulin detemir (LEVEMIR) injection 20 Units, 20 Units, Subcutaneous, BID, Lavina Hamman, MD, 20 Units at 08/30/18 1057 .  ipratropium-albuterol (DUONEB) 0.5-2.5 (3) MG/3ML nebulizer solution 3 mL, 3 mL, Nebulization, Q6H PRN, Sherren Mocha, MD .  irbesartan (AVAPRO) tablet 75 mg, 75 mg, Oral, Daily, Minus Breeding, MD, 75 mg at 08/30/18 1056 .  levothyroxine (SYNTHROID) tablet 75 mcg, 75 mcg, Oral, Daily, Sherren Mocha, MD, 75 mcg at 08/30/18 [phone] .  MEDLINE mouth rinse, 15 mL, Mouth Rinse, BID, Lavina Hamman, MD, 15 mL at 08/30/18 1059 .  metoprolol tartrate (LOPRESSOR) tablet 50 mg, 50 mg, Oral, BID, Lavina Hamman, MD, 50 mg at 08/30/18 1056 .  ondansetron (ZOFRAN) injection 4 mg, 4 mg, Intravenous, Q6H PRN, Sherren Mocha, MD .  potassium  chloride SA (K-DUR) CR tablet 40 mEq, 40 mEq, Oral, Daily, [place] Donnamarie Poag, DO, 40 mEq at 08/30/18 1059 .  QUEtiapine (SEROQUEL) tablet 25 mg, 25 mg, Oral, QHS, Lavina Hamman, MD, 25 mg at 08/29/18 2104 .  Resource ThickenUp Clear, , Oral, PRN, Lavina Hamman, MD .  senna-docusate (Senokot-S) tablet 1 tablet, 1 tablet, Oral, BID, Lavina Hamman, MD, 1 tablet at 08/30/18 1056 .  sodium chloride flush (NS) 0.9 % injection 3 mL, 3 mL, Intravenous, Q12H, Sherren Mocha, MD, 3 mL at 08/30/18 1059 .  sodium chloride flush (NS) 0.9 % injection 3 mL, 3 mL, Intravenous, PRN, Sherren Mocha, MD .  zolpidem (AMBIEN) tablet 5 mg, 5 mg, Oral, QHS PRN, Lavina Hamman, MD  Patients Current Diet:  Diet Order            Diet - low sodium heart healthy        DIET DYS 2 Room service appropriate? Yes; Fluid consistency: Nectar Thick  Diet effective now              Precautions / Restrictions Precautions Precautions: Fall, Other (comment) Precaution Comments: Bilateral soft mitts Restrictions Weight Bearing Restrictions: No   Has the patient had 2 or more falls or a fall with injury in the past year?No  Prior Activity Level Community (5-7x/wk): very active, driving, works in the yard  Prior Functional Level Prior Function Level of Independence: Independent Comments: ADLs, IADLs, driving  Self Care: Did the patient need help bathing, dressing, using the toilet or eating?  Independent  Indoor Mobility: Did the patient need assistance with walking from room to room (with or without device)? Independent  Stairs: Did the patient need assistance with internal or external stairs (with or without device)? Independent  Functional Cognition: Did the patient need help planning regular tasks such as shopping or remembering to take medications? [place] / [place] Devices/Equipment: Cane (specify quad or straight) Home Equipment: Walker - 2  wheels  Prior Device Use: Indicate devices/aids used by the patient prior to current illness, exacerbation or injury? None of the above  Current Functional Level Cognition  Overall Cognitive Status: Impaired/Different from baseline Current Attention Level: Focused Orientation Level: Oriented X4 Following Commands: Follows one step commands inconsistently Safety/Judgement: Decreased awareness of safety, Decreased awareness of deficits General Comments: Pt following simple commands. When asked what day it is, pt able to report it is May 12 but then stated "1920". Pt requiring increased cues and time throughout session. Poor attention and safety awarness.     Extremity Assessment (includes Sensation/Coordination)  Upper Extremity Assessment: Generalized weakness  Lower Extremity Assessment: Defer to PT evaluation RLE Deficits / Details: moves spontaneously against gravity.  No MMT due to pt unable to follow direction.  Pt able to bear weight in standing RLE Coordination: decreased fine motor LLE Deficits / Details: moves spontaneously against gravity.  no MMT due to unable to follow direction. LLE Coordination: decreased fine motor    ADLs  Overall ADL's : Needs assistance/impaired Eating/Feeding: Set up, Supervision/ safety, Sitting Eating/Feeding Details (indicate cue type and reason): Pt managing his coffee and placing a straw into the lid. Supervision for safety while pt drank his coffee for safety with mitts off. Grooming: Brushing hair, Oral care, Sitting, Set up, Supervision/safety Grooming Details (indicate cue type and reason): supervision for safety. Cues for attention. Pt brushing his hair and brushing his teeth while seated in recliner. Upon spitting into the basin during oral care, pt spitting on himself and lap instead of basin.  Upper Body Bathing: Minimal assistance, Sitting Lower Body Bathing: Maximal assistance, Sit to/from stand Upper Body Dressing : Minimal assistance,  Sitting Lower Body Dressing: Maximal assistance, Sit to/from stand Toilet Transfer: Maximal assistance(sit<>stand at recliner) Functional mobility during ADLs: Maximal assistance, Rolling walker(sit<>stand) General ADL Comments: Pt presenting with decreased cognition and balance impacting his safety and functional performance  Mobility  Overal bed mobility: Needs Assistance Bed Mobility: Supine to Sit Supine to sit: Min assist, HOB elevated Sit to supine: Mod assist, Max assist, +2 for physical assistance General bed mobility comments: In recliner upon arrival    Transfers  Overall transfer level: Needs assistance Equipment used: Rolling walker (2 wheeled) Transfers: Sit to/from Stand Sit to Stand: Max assist  Lateral/Scoot Transfers: Total assist, +2 physical assistance General transfer comment: Max A to power up into standing from recliner and then prevent posterior lean. Pt requiring increased asssitance to gain balance. Poor correction of balance with cues    Ambulation / Gait / Stairs / Wheelchair Mobility  Ambulation/Gait Ambulation/Gait assistance: Mod assist, Max assist Gait Distance (Feet): 5 Feet Assistive device: Rolling walker (2 wheeled) Gait Pattern/deviations: Step-to pattern General Gait Details: Performed 2x trials ambulating steps forwards/backwards with RW, requiring intermittent mod-maxA to prevent posterior LOB, at times min guard with cues. DOE 3/4 requiring seated rest between trials. SpO2 >89% on RA Gait velocity: Decreased    Posture / Balance Dynamic Sitting Balance Sitting balance - Comments: Able to maintain bouts of seated balance with min guard, but moving in all directiosn requiring frequents cues to focus on sitting upright. Pt reports "woozy" although BP/vitals stable Balance Overall balance assessment: Needs assistance Sitting-balance support: Single extremity supported, Bilateral upper extremity supported Sitting balance-Leahy Scale:  Fair Sitting balance - Comments: Able to maintain bouts of seated balance with min guard, but moving in all directiosn requiring frequents cues to focus on sitting upright. Pt reports "woozy" although BP/vitals stable Standing balance support: Bilateral upper extremity supported Standing balance-Leahy Scale: Poor Standing balance comment: Reliant on UE support and external assist    Special needs/care consideration BiPAP/CPAP no CPM no Continuous Drip IV no Dialysis no        Days n/a Life Vest no Oxygen room air Special Bed no Trach Size no Wound Vac (area) no      Location n/a Skin abrasion to R face, ecchymosis to bilat UEs, LEs, and groin Bowel mgmt: incontinent, last BM 08/28/2018 Bladder mgmt: condom cath Diabetic mgmt yes Behavioral consideration no Chemo/radiation no     Previous Home Environment (from acute therapy documentation) Living Arrangements: Spouse/significant other Available Help at Discharge: Family Type of Home: House Bathroom Shower/Tub: Tub/shower unit Home Care Services: No Additional Comments: Enjoys riding his gollf cart around  Discharge Living Setting Plans for Discharge Living Setting: Patient's home Type of Home at Discharge: House Discharge Home Layout: One level Discharge Home Access: Stairs to enter Entrance Stairs-Rails: None Entrance Stairs-Number of Steps: 1(3 steps to enter back with B HR) Discharge Bathroom Shower/Tub: Tub/shower unit Discharge Bathroom Toilet: Standard Discharge Bathroom Accessibility: Yes How Accessible: Accessible via wheelchair, Accessible via walker Does the patient have any problems obtaining your medications?: No  Social/Family/Support Systems Patient Roles: Parent Contact Information: lives with wife, Dorian Pod who is retired and home 24/7, primary contact is his son Scientist, physiological Anticipated Caregiver: Scientist, physiological Anticipated Caregiver's Contact Information: [phone] Caregiver Availability: 24/7(per Scientist, physiological, they will work  out whatever assist is needed, whether that means Dorian Pod providing 24/7 supervision or Scientist, physiological providing physical assist if needed) Discharge Plan Discussed with Primary Caregiver: Yes Is Caregiver In Agreement with Plan?: Yes Does Caregiver/Family have Issues with Lodging/Transportation while Pt is in Rehab?: No   Goals/Additional Needs Patient/Family Goal for Rehab: PT/OT/SLP supervision Expected length of stay: 15-22 days Dietary Needs: D2/nectar Equipment Needs: tbd Additional Information: 1:1 sitter d/c'd on 08/29/2018 Pt/Family Agrees to Admission and  willing to participate: Yes Program Orientation Provided & Reviewed with Pt/Caregiver Including Roles  & Responsibilities: Yes   Possible need for SNF placement upon discharge: no   Patient Condition: This patient's medical and functional status has changed since the consult dated: 08/28/2018 in which the Rehabilitation Physician determined and documented that the patient's condition is appropriate for intensive rehabilitative care in an inpatient rehabilitation facility. See "History of Present Illness" (above) for medical update. Functional changes are: pt mod/max with mobility, improving cognition. Patient's medical and functional status update has been discussed with the Rehabilitation physician and patient remains appropriate for inpatient rehabilitation. Will admit to inpatient rehab today.  Preadmission Screen Completed By:  Michel Santee, PT, DPT 08/30/2018 12:39 PM ______________________________________________________________________   Discussed status with Dr. Posey Pronto on 08/30/18 at 12:39 PM  and received approval for admission today.  Admission Coordinator:  Michel Santee, PT, DPT 12:39 Heidi Dach Sudie Grumbling 08/30/18

## 2018-08-31 ENCOUNTER — Inpatient Hospital Stay (HOSPITAL_COMMUNITY): Payer: Medicare PPO | Admitting: Speech Pathology

## 2018-08-31 ENCOUNTER — Inpatient Hospital Stay (HOSPITAL_COMMUNITY): Payer: Medicare PPO

## 2018-08-31 ENCOUNTER — Other Ambulatory Visit: Payer: Self-pay

## 2018-08-31 ENCOUNTER — Encounter: Payer: Self-pay | Admitting: *Deleted

## 2018-08-31 ENCOUNTER — Encounter (HOSPITAL_COMMUNITY): Payer: Self-pay

## 2018-08-31 ENCOUNTER — Telehealth: Payer: Medicare PPO

## 2018-08-31 ENCOUNTER — Inpatient Hospital Stay (HOSPITAL_COMMUNITY): Payer: Medicare PPO | Admitting: Occupational Therapy

## 2018-08-31 DIAGNOSIS — I4891 Unspecified atrial fibrillation: Secondary | ICD-10-CM

## 2018-08-31 DIAGNOSIS — R5381 Other malaise: Principal | ICD-10-CM

## 2018-08-31 DIAGNOSIS — E119 Type 2 diabetes mellitus without complications: Secondary | ICD-10-CM

## 2018-08-31 LAB — CBC WITH DIFFERENTIAL/PLATELET
Abs Immature Granulocytes: 0.1 10*3/uL — ABNORMAL HIGH (ref 0.00–0.07)
Basophils Absolute: 0.1 10*3/uL (ref 0.0–0.1)
Basophils Relative: 1 %
Eosinophils Absolute: 0.4 10*3/uL (ref 0.0–0.5)
Eosinophils Relative: 5 %
HCT: 31.4 % — ABNORMAL LOW (ref 39.0–52.0)
Hemoglobin: 9.9 g/dL — ABNORMAL LOW (ref 13.0–17.0)
Immature Granulocytes: 1 %
Lymphocytes Relative: 18 %
Lymphs Abs: 1.4 10*3/uL (ref 0.7–4.0)
MCH: 26.4 pg (ref 26.0–34.0)
MCHC: 31.5 g/dL (ref 30.0–36.0)
MCV: 83.7 fL (ref 80.0–100.0)
Monocytes Absolute: 0.7 10*3/uL (ref 0.1–1.0)
Monocytes Relative: 9 %
Neutro Abs: 5.3 10*3/uL (ref 1.7–7.7)
Neutrophils Relative %: 66 %
Platelets: 403 10*3/uL — ABNORMAL HIGH (ref 150–400)
RBC: 3.75 MIL/uL — ABNORMAL LOW (ref 4.22–5.81)
RDW: 15.1 % (ref 11.5–15.5)
WBC: 8 10*3/uL (ref 4.0–10.5)
nRBC: 0 % (ref 0.0–0.2)

## 2018-08-31 LAB — COMPREHENSIVE METABOLIC PANEL
ALT: 44 U/L (ref 0–44)
AST: 30 U/L (ref 15–41)
Albumin: 2.6 g/dL — ABNORMAL LOW (ref 3.5–5.0)
Alkaline Phosphatase: 108 U/L (ref 38–126)
Anion gap: 9 (ref 5–15)
BUN: 20 mg/dL (ref 8–23)
CO2: 25 mmol/L (ref 22–32)
Calcium: 8.9 mg/dL (ref 8.9–10.3)
Chloride: 107 mmol/L (ref 98–111)
Creatinine, Ser: 0.99 mg/dL (ref 0.61–1.24)
GFR calc Af Amer: >60 mL/min (ref >60)
GFR calc non Af Amer: >60 mL/min (ref >60)
Glucose, Bld: 185 mg/dL — ABNORMAL HIGH (ref 70–99)
Potassium: 4 mmol/L (ref 3.5–5.1)
Sodium: 141 mmol/L (ref 135–145)
Total Bilirubin: 0.9 mg/dL (ref 0.3–1.2)
Total Protein: 5.5 g/dL — ABNORMAL LOW (ref 6.5–8.1)

## 2018-08-31 LAB — GLUCOSE, CAPILLARY
Glucose-Capillary: 183 mg/dL — ABNORMAL HIGH (ref 70–99)
Glucose-Capillary: 251 mg/dL — ABNORMAL HIGH (ref 70–99)
Glucose-Capillary: 239 mg/dL — ABNORMAL HIGH (ref 70–99)
Glucose-Capillary: 291 mg/dL — ABNORMAL HIGH (ref 70–99)

## 2018-08-31 MED ORDER — LIVING WELL WITH DIABETES BOOK
Freq: Once | Status: AC
  Administered 2018-08-31: 13:00:00
  Filled 2018-08-31: qty 1

## 2018-08-31 MED ORDER — APIXABAN 5 MG PO TABS
5.0000 mg | ORAL_TABLET | Freq: Two times a day (BID) | ORAL | Status: DC
  Administered 2018-08-31 – 2018-09-07 (×14): 5 mg via ORAL
  Filled 2018-08-31 (×14): qty 1

## 2018-08-31 MED ORDER — GUAIFENESIN 100 MG/5ML PO SOLN
5.0000 mL | Freq: Four times a day (QID) | ORAL | Status: DC | PRN
  Administered 2018-08-31: 100 mg via ORAL
  Filled 2018-08-31: qty 5

## 2018-08-31 MED ORDER — BLOOD PRESSURE CONTROL BOOK
Freq: Once | Status: AC
  Administered 2018-08-31: 13:00:00
  Filled 2018-08-31: qty 1

## 2018-08-31 NOTE — Care Management Note (Signed)
[facility] Individual Statement of Services  Patient Name:  [name] [name]  Date:  08/31/2018  Welcome to the [facility].  Our goal is to provide you with an individualized program based on your diagnosis and situation, designed to meet your specific needs.  With this comprehensive rehabilitation program, you will be expected to participate in at least 3 hours of rehabilitation therapies Monday-Friday, with modified therapy programming on the weekends.  Your rehabilitation program will include the following services:  Physical Therapy (PT), Occupational Therapy (OT), Speech Therapy (ST), 24 hour per day rehabilitation nursing, Case Management (Social Worker), Rehabilitation Medicine, Nutrition Services and Pharmacy Services  Weekly team conferences will be held on Wednesday to discuss your progress.  Your Social Worker will talk with you frequently to get your input and to update you on team discussions.  Team conferences with you and your family in attendance may also be held.  Expected length of stay: 14-18 days  Overall anticipated outcome:supervision level  Depending on your progress and recovery, your program may change. Your Social Worker will coordinate services and will keep you informed of any changes. Your Social Worker's name and contact numbers are listed  below.  The following services may also be recommended but are not provided by the [facility] will be made to provide these services after discharge if needed.  Arrangements include referral to agencies that provide these services.  Your insurance has been verified to be:  [employer] Your primary doctor is:  Redge Gainer  Pertinent information will be shared with your doctor and your insurance company.  Social Worker:  Ovidio Kin, [place] or [phone]  Information discussed with and copy given to patient by: Elease Hashimoto, 08/31/2018, 9:26 AM

## 2018-08-31 NOTE — Progress Notes (Signed)
[facility] PHYSICAL MEDICINE & REHABILITATION PROGRESS NOTE   Subjective/Complaints: Feels okay today.  Occasional cough.  View of systems negative for chest pain shortness of breath nausea vomiting diarrhea or constipation   Objective:   Dg Chest 2 View  Result Date: 08/30/2018 CLINICAL DATA:  Aspiration pneumonia. Choking episode on 08/17/2018. Cardiac arrest. EXAM: CHEST - 2 VIEW COMPARISON:  11/24/2017 FINDINGS: Heart size is normal. Aortic atherosclerosis is noted. Coronary artery stent. There is atelectasis in both lower lobes, left worse than right. Small effusions in the posterior costophrenic angles. IMPRESSION: Bilateral lower lobe atelectasis/pneumonia left more than right with small effusions. Electronically Signed   By: Nelson Chimes M.D.   On: 08/30/2018 19:17   Recent Labs    08/30/18 0156 08/31/18 0503  WBC 8.8 8.0  HGB 9.4* 9.9*  HCT 30.1* 31.4*  PLT 345 403*   Recent Labs    08/30/18 0156 08/31/18 0503  NA 144 141  K 3.6 4.0  CL 111 107  CO2 24 25  GLUCOSE 150* 185*  BUN 23 20  CREATININE 1.06 0.99  CALCIUM 8.7* 8.9    Intake/Output Summary (Last 24 hours) at 08/31/2018 0844 Last data filed at 08/31/2018 0700 Gross per 24 hour  Intake 60 ml  Output 200 ml  Net -140 ml     Physical Exam: Vital Signs Blood pressure 136/74, pulse 67, temperature 97.9 F (36.6 C), temperature source Oral, resp. rate 19, height 5\' 10"  (1.778 m), weight 69.7 kg, SpO2 100 %.   General: No acute distress, edentulous Mood and affect are appropriate Heart: Regular rate and rhythm no rubs murmurs or extra sounds Lungs: Clear to auscultation, breathing unlabored, no rales or wheezes Abdomen: Positive bowel sounds, soft nontender to palpation, nondistended Extremities: No clubbing, cyanosis, or edema Skin: No evidence of breakdown, no evidence of rash Neurologic: Cranial nerves II through XII intact, motor strength is 4/5 in bilateral deltoid, bicep, tricep, grip, hip  flexor, knee extensors, ankle dorsiflexor and plantar flexor Sensory exam normal sensation to light touch and proprioception in bilateral upper and lower extremities Cerebellar exam normal finger to nose to finger as well as heel to shin in bilateral upper and lower extremities Musculoskeletal: Full range of motion in all 4 extremities. No joint swelling Oriented x3 Dysarthria  Assessment/Plan: 1. Functional deficits secondary to Debility and encephalopathy which require 3+ hours per day of interdisciplinary therapy in a comprehensive inpatient rehab setting.  Physiatrist is providing close team supervision and 24 hour management of active medical problems listed below.  Physiatrist and rehab team continue to assess barriers to discharge/monitor patient progress toward functional and medical goals  Care Tool:  Bathing              Bathing assist       Upper Body Dressing/Undressing Upper body dressing   What is the patient wearing?: Hospital gown only    Upper body assist Assist Level: Minimal Assistance - Patient > 75%    Lower Body Dressing/Undressing Lower body dressing      What is the patient wearing?: Incontinence brief     Lower body assist Assist for lower body dressing: Total Assistance - Patient < 25%     Toileting Toileting    Toileting assist Assist for toileting: Total Assistance - Patient < 25%     Transfers Chair/bed transfer  Transfers assist           Locomotion Ambulation   Ambulation assist  Walk 10 feet activity   Assist           Walk 50 feet activity   Assist           Walk 150 feet activity   Assist           Walk 10 feet on uneven surface  activity   Assist           Wheelchair     Assist               Wheelchair 50 feet with 2 turns activity    Assist            Wheelchair 150 feet activity     Assist          Medical Problem List and  Plan: 1.  Debility and acute encephalopathy secondary to cardiac arrest status post stenting/multi-medical             CIR evals PT OT speech 2.  Antithrombotics: -DVT/anticoagulation: Eliquis             -antiplatelet therapy: Aspirin 81 mg daily, Plavix 75 mg daily.  Continue aspirin for 1 month and then Plavix for at least 6 months. 3. Pain Management: Tylenol as needed 4. Mood: Provide emotional support             -antipsychotic agents: Seroquel 25 mg nightly 5. Neuropsych: This patient is not capable of making decisions on his own behalf. 6. Skin/Wound Care: Routine skin checks 7. Fluids/Electrolytes/Nutrition: Routine in and outs.  Follow-up BMP tomorrow a.m. 8.  Acute diastolic congestive heart failure.  Monitor for signs of fluid overload 9.  PAF with RVR.  Continue Eliquis.  Amiodarone adjusted as per cardiology services.  Transition to amiodarone 200 mg daily tomorrow. 10.  Hypertension.  Norvasc 5 mg daily, Avapro 75 mg daily, Lopressor 50 mg twice daily.  Monitor with increased mobility Vitals:   08/30/18 2002 08/31/18 0553  BP: (!) 123/59 136/74  Pulse: 67 67  Resp: 18 19  Temp: 97.9 F (36.6 C) 97.9 F (36.6 C)  SpO2: 97% 100%   11.  Diabetes mellitus.  Hemoglobin A1c 8.7.  Levemir 20 units twice daily.  Check blood sugars before meals and at bedtime.  Monitor with increased mobility. CBG (last 3)  Recent Labs    08/30/18 1132 08/30/18 2129 08/31/18 0616  GLUCAP 291* 353* 183*  Elevated will need to adjust insulin 12.  Dysphagia.  Dysphagia #2 nectar liquids.  Follow-up speech therapy.  Advance as tolerated. 13.  Aspiration pneumonia.  IV cefepime completed 08/29/2018.  Chest x-ray on 08/25/2018 reviewed showing bilateral opacities.repeat chest x-ray demonstrates atelectasis, patient afebrile with normal white count will monitor. 14.  Hyperlipidemia.  Lipitor 15.  Hypothyroidism.  Synthroid  LOS: 1 days A FACE TO [place] E  Fontaine Hehl 08/31/2018, 8:44 AM

## 2018-08-31 NOTE — Evaluation (Signed)
Physical Therapy Assessment and Plan  Patient Details  Name: [name] [name] MRN: [id number] Date of Birth: 1940/06/05  PT Diagnosis: Abnormal posture, Abnormality of gait, Cognitive deficits and Dizziness and giddiness Rehab Potential: Good ELOS:     Today's Date: 08/31/2018 PT Individual Time: 1093-2355 PT Individual Time Calculation (min): 58 min    Problem List:  Patient Active Problem List   Diagnosis Date Noted  . Debility 08/30/2018  . CAD S/P percutaneous coronary angioplasty 08/30/2018  . Atrial fibrillation, chronic 08/30/2018  . HTN (hypertension) 08/30/2018  . T2DM (type 2 diabetes mellitus) ([place]) 08/30/2018  . Acute diastolic congestive heart failure ([place])   . Aspiration pneumonia of both lungs ([place])   . Atrial fibrillation with rapid ventricular response ([place])   . Diabetes mellitus type 2 in nonobese (HCC)   . Dyslipidemia   . Diabetic retinopathy ([place]) 05/22/2017  . Peripheral vascular insufficiency ([place]) 03/18/2016  . Family history of heart disease 03/18/2016  . Right inguinal hernia 12/13/2013  . Essential hypertension 12/13/2013  . BPH (benign prostatic hyperplasia) 12/13/2013  . Hypothyroidism 08/07/2013  . Vitamin D deficiency 04/02/2013  . Goiter, nontoxic, multinodular   . Kidney stone   . Hyperlipidemia 01/15/2009  . ABNORMAL ELECTROCARDIOGRAM 01/15/2009  . Type 2 diabetes mellitus treated with insulin ([place]) 01/09/2009  . HIATAL HERNIA 01/09/2009  . BASAL CELL CARCINOMA, HX OF 01/09/2009  . DIVERTICULITIS, HX OF 01/09/2009    Past Medical History:  Past Medical History:  Diagnosis Date  . Basal cell carcinoma    left forehead, nose, neck  . Cataract   . Diabetes mellitus   . Diabetes mellitus without complication ([place])   . Diverticulitis   . Erectile dysfunction   . Gastroesophageal reflux disease with hiatal hernia   . Goiter, nontoxic, multinodular    with macrocyst  . Hiatal hernia    With reflex  . Hyperlipidemia   . Inguinal  hernia    right side  . Kidney stone 1980  . Leg fracture    Left  . Liver hemangioma   . Nephrolithiasis   . Vitamin D deficiency    Past Surgical History:  Past Surgical History:  Procedure Laterality Date  . BACK SURGERY  1984  . CATARACT EXTRACTION Bilateral   . CORONARY STENT INTERVENTION N/A 08/25/2018   Procedure: CORONARY STENT INTERVENTION;  Surgeon: Sherren Mocha, MD;  Location: [place] [facility];  Service: Cardiovascular;  Laterality: N/A;  . EYE SURGERY    . LACERATION REPAIR  2008   Dr. Lenon Curt -to hand  . LEFT HEART CATH AND CORONARY ANGIOGRAPHY N/A 08/25/2018   Procedure: LEFT HEART CATH AND CORONARY ANGIOGRAPHY;  Surgeon: Sherren Mocha, MD;  Location: [place] [facility];  Service: Cardiovascular;  Laterality: N/A;  . TIBIA FRACTURE SURGERY     left leg    Assessment & Plan Clinical Impression: HPI: Authur Cubit. Lazenby is a 78 year old right-handed male with history of hypertension, diabetes mellitus.  Per chart review and some history from patient, patient lives with spouse independent prior to admission.  There is also a son in the area and family plans to provide assistance as needed.  Presented 08/17/2018 after witnessed cardiac arrest.  CPR initiated by bystander.  Per report patient had initially complained of fatigue possibly low blood sugar while trying to eat a muffin before witnessed arrest.  He reportedly struck his head when he collapsed.  He received 11 minutes of CPR.  He was intubated at the scene.  Admit  08/17/2018.  COVID negative.  Work-up revealed leukocytosis with WBCs 18,000 and troponin 0.03.  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  He was extubated 08/23/2018.  Hospital course complicated by A. fib with RVR.  Maintained on aspirin, Plavix as well as Eliquis and amiodarone which has been adjusted with follow-up per cardiology services.  Acute on chronic anemia hemoglobin 9.3-10.7.  Patient did complete a 7-day  course of cefepime 08/29/2018 for question aspiration pneumonia.  He is on a dysphagia #2 nectar thick liquid diet.  Patient with ongoing bouts of confusion and restlessness suspect encephalopathy related to cardiac arrest.  An EEG was completed that was negative for seizure.  He was seen by cardiology today and deemed appropriate for CIR, notes reviewed.  Patient transferred to CIR on 08/30/2018 .   Patient currently requires max assist with mobility secondary to muscle weakness and muscle joint tightness, decreased cardiorespiratoy endurance, unbalanced muscle activation and decreased coordination, decreased attention, decreased awareness, decreased problem solving, decreased safety awareness and decreased memory and decreased sitting balance, decreased standing balance, decreased postural control and decreased balance strategies.  Prior to hospitalization, patient was independent  with mobility and lived with Spouse in a Mobile home home.  Home access is  Level entry(Pt states "maybe 1/2 step"). Back entrance is 3 STE with R rail, per chart.   Patient will benefit from skilled PT intervention to maximize safe functional mobility, minimize fall risk and decrease caregiver burden for planned discharge home with 24 hour supervision.  Anticipate patient will benefit from follow up [place] at discharge.  PT - End of Session Activity Tolerance: Tolerates < 10 min activity, no significant change in vital signs Endurance Deficit: Yes Endurance Deficit Description: tired and needed numerous seated breaks during eval PT Assessment Rehab Potential (ACUTE/IP ONLY): Good PT Patient demonstrates impairments in the following area(s): Balance;Behavior;Endurance;Motor;Safety PT Transfers Functional Problem(s): Bed Mobility;Bed to Chair;Car;Furniture PT Locomotion Functional Problem(s): Ambulation;Wheelchair Mobility;Stairs PT Plan PT Intensity: Minimum of 1-2 x/day ,45 to 90 minutes PT Frequency: 5 out of 7 days PT  Treatment/Interventions: Ambulation/gait training;Stair training;Balance/vestibular training;DME/adaptive equipment instruction;Patient/family education;Therapeutic Activities;Wheelchair propulsion/positioning;Cognitive remediation/compensation;Psychosocial support;Therapeutic Exercise;Community reintegration;Functional mobility training;UE/LE Strength taining/ROM;Discharge planning;Neuromuscular re-education;Splinting/orthotics;UE/LE Coordination activities PT Transfers Anticipated Outcome(s): supervision basic and car PT Locomotion Anticipated Outcome(s): supervision gait x 100' with LRAD controlled env, x 50' home env; superivsion w/c x 150' level tile controlled PT Recommendation Follow Up Recommendations: Home health PT Patient destination: Home Equipment Recommended: To be determined  Skilled Therapeutic Intervention  Pt resting in bed.  See evaluation below.  Dysarthria without dentures made communication difficult.  When asked why he was at [facility], pt reported that he "choked on a doughnut".  PT oriented pt.  He became tearful when talking about his dogs at home.  NT brought in items from home, including dentures.  Pt inserted them, but continued to be dysarthric, and had mild drooling.  He state that he usually uses denture adhesive.  Upon sitting up he became dizzy, resolving in a couple of minutes.   He participated in w/c propulsion using bil UEs with mod assist due to in attention to obstacles on L; he locked and unlocked brakes with hand -over- hand assist.  Simulated car transfer with max assist for squat pivot, due to L lean.  Sit> stand varied from max assist> mod assist during session.  When attempting gait, pt leaned posteriorly strongly and was unable to come to midline. He was noted to be wet, although he stated  that he knows when he has to urinate and uses the urinal.  Discussed PLOC, ELOS and goals with pt.  When transferring back to bed, to R squat pivot, pt immediately lay down on  the bed, head at foot board, due to fatigue.  Mod assist to sit pt back up and re-orient his body appropriately.      PT Evaluation Precautions/Restrictions Precautions Precautions: Fall Restrictions Weight Bearing Restrictions: No  Pain Pain Assessment Pain Score: 0-No pain Home Living/Prior Functioning Home Living Available Help at Discharge: Family Type of Home: Mobile home Home Access: Level entry(Pt states "maybe 1/2 step") Home Layout: One level Bathroom Shower/Tub: Chiropodist: Standard Additional Comments: Enjoys golf  Lives With: Spouse Prior Function Level of Independence: Independent with basic ADLs;Independent with homemaking with ambulation  Able to Take Stairs?: Yes(per pt) Driving: Yes Vocation: Retired Leisure: Hobbies-yes (Comment)(gardens, has a treadmill, 2 outside dogs) Vision/Perception - able to read his schedule without glasses; he reported no changes    Cognition Overall Cognitive Status: Impaired/Different from baseline Arousal/Alertness: Awake/alert Orientation Level: Oriented to person;Oriented to place;Oriented to time;Disoriented to situation(pt stated that he was at CIR because he choked on a doughnut) Memory: Impaired Awareness: Impaired Behaviors: Impulsive Safety/Judgment: Impaired Sensation Sensation Light Touch: Appears Intact Proprioception: Not tested Coordination Gross Motor Movements are Fluid and Coordinated: No Fine Motor Movements are Fluid and Coordinated: No Heel Shin Test: = bil; SOB after 4 movements Motor  Motor Motor: Other (comment) Motor - Skilled Clinical Observations: Generalized weakness  Mobility Bed Mobility Bed Mobility: Rolling Right;Rolling Left;Right Sidelying to Sit Rolling Right: Supervision/verbal cueing Rolling Left: Supervision/Verbal cueing Right Sidelying to Sit: Moderate Assistance - Patient 50-74% Transfers Transfers: Stand Pivot Transfers Sit to Stand: Minimal Assistance  - Patient > 75% Stand to Sit: Moderate Assistance - Patient 50-74%;Minimal Assistance - Patient > 75% Stand Pivot Transfers: Maximal Assistance - Patient 25 - 49% Stand Pivot Transfer Details: Manual facilitation for weight shifting;Verbal cues for technique Stand Pivot Transfer Details (indicate cue type and reason): L and posterior lean in standing; difficulty turning to sit in w/c on L Transfer (Assistive device): None Locomotion  Gait Ambulation: Yes Gait Assistance: Maximal Assistance - Patient 25-49% Gait Distance (Feet): 3 Feet Assistive device: None Gait Gait: Yes Gait Pattern: Impaired Gait Pattern: Right flexed knee in stance;Left flexed knee in stance;Decreased step length - left;Decreased trunk rotation;Narrow base of support;Lateral trunk lean to left;Step-through pattern;Decreased step length - right;Decreased hip/knee flexion - right;Decreased hip/knee flexion - left Stairs / Additional Locomotion Stairs: No Wheelchair Mobility Wheelchair Mobility: Yes Wheelchair Assistance: Moderate Assistance - Patient 50 - 74% Wheelchair Propulsion: Both upper extremities Wheelchair Parts Management: Needs assistance Distance: 25  Trunk/Postural Assessment  Cervical Assessment Cervical Assessment: Within Functional Limits Thoracic Assessment Thoracic Assessment: Within Functional Limits Lumbar Assessment Lumbar Assessment: Within Functional Limits Postural Control Postural Control: Deficits on evaluation Righting Reactions: impaired ;  L and posterior strong lean Protective Responses: poor for posterior lean  Balance Balance Balance Assessed: Yes Static Sitting Balance Static Sitting - Balance Support: Feet supported;Bilateral upper extremity supported Static Sitting - Level of Assistance: 3: Mod assist Dynamic Sitting Balance Dynamic Sitting - Balance Support: Feet supported;During functional activity Dynamic Sitting - Level of Assistance: 2: Max assist Sitting balance -  Comments: Able to maintain bouts of seated balance with Min A and BUE supported, with any increased task demand or removing UE support, requires max/total w/ posterior and lateral LOB to the L. Static Standing Balance Static  Standing - Balance Support: No upper extremity supported Static Standing - Level of Assistance: 2: Max assist Extremity Assessment      RLE Assessment RLE Assessment: Exceptions to [facility] Passive Range of Motion (PROM) Comments: tight hamstrings and heel cords General Strength Comments: grossly in sitting 4/5 LLE Assessment LLE Assessment: Exceptions to [facility] Passive Range of Motion (PROM) Comments: tight hamstrings and heel cords General Strength Comments: grossly in sitting 4+/5    Refer to Care Plan for Long Term Goals  Recommendations for other services: None   Discharge Criteria: Patient will be discharged from PT if patient refuses treatment 3 consecutive times without medical reason, if treatment goals not met, if there is a change in medical status, if patient makes no progress towards goals or if patient is discharged from hospital.  The above assessment, treatment plan, treatment alternatives and goals were discussed and mutually agreed upon: by patient  Emanuelle Hammerstrom 08/31/2018, 5:00 PM

## 2018-08-31 NOTE — Evaluation (Signed)
Speech Language Pathology Assessment and Plan  Patient Details  Name: RENTON BERKLEY MRN: [id number] Date of Birth: 1940-07-14  SLP Diagnosis: Cognitive Impairments;Speech and Language deficits;Dysphagia  Rehab Potential: Good ELOS: 14-18 days    Today's Date: 08/31/2018 SLP Individual Time: 0165-5374 SLP Individual Time Calculation (min): 60 min   Problem List:  Patient Active Problem List   Diagnosis Date Noted  . Debility 08/30/2018  . CAD S/P percutaneous coronary angioplasty 08/30/2018  . Atrial fibrillation, chronic 08/30/2018  . HTN (hypertension) 08/30/2018  . T2DM (type 2 diabetes mellitus) ([place]) 08/30/2018  . Acute diastolic congestive heart failure ([place])   . Aspiration pneumonia of both lungs ([place])   . Atrial fibrillation with rapid ventricular response ([place])   . Diabetes mellitus type 2 in nonobese (HCC)   . Dyslipidemia   . Diabetic retinopathy ([place]) 05/22/2017  . Peripheral vascular insufficiency ([place]) 03/18/2016  . Family history of heart disease 03/18/2016  . Right inguinal hernia 12/13/2013  . Essential hypertension 12/13/2013  . BPH (benign prostatic hyperplasia) 12/13/2013  . Hypothyroidism 08/07/2013  . Vitamin D deficiency 04/02/2013  . Goiter, nontoxic, multinodular   . Kidney stone   . Hyperlipidemia 01/15/2009  . ABNORMAL ELECTROCARDIOGRAM 01/15/2009  . Type 2 diabetes mellitus treated with insulin ([place]) 01/09/2009  . HIATAL HERNIA 01/09/2009  . BASAL CELL CARCINOMA, HX OF 01/09/2009  . DIVERTICULITIS, HX OF 01/09/2009   Past Medical History:  Past Medical History:  Diagnosis Date  . Basal cell carcinoma    left forehead, nose, neck  . Cataract   . Diabetes mellitus   . Diabetes mellitus without complication ([place])   . Diverticulitis   . Erectile dysfunction   . Gastroesophageal reflux disease with hiatal hernia   . Goiter, nontoxic, multinodular    with macrocyst  . Hiatal hernia    With reflex  . Hyperlipidemia   . Inguinal hernia     right side  . Kidney stone 1980  . Leg fracture    Left  . Liver hemangioma   . Nephrolithiasis   . Vitamin D deficiency    Past Surgical History:  Past Surgical History:  Procedure Laterality Date  . BACK SURGERY  1984  . CATARACT EXTRACTION Bilateral   . CORONARY STENT INTERVENTION N/A 08/25/2018   Procedure: CORONARY STENT INTERVENTION;  Surgeon: Sherren Mocha, MD;  Location: [place] [facility];  Service: Cardiovascular;  Laterality: N/A;  . EYE SURGERY    . LACERATION REPAIR  2008   Dr. Lenon Curt -to hand  . LEFT HEART CATH AND CORONARY ANGIOGRAPHY N/A 08/25/2018   Procedure: LEFT HEART CATH AND CORONARY ANGIOGRAPHY;  Surgeon: Sherren Mocha, MD;  Location: [place] [facility];  Service: Cardiovascular;  Laterality: N/A;  . TIBIA FRACTURE SURGERY     left leg    Assessment / Plan / Recommendation Clinical Impression Pt is a 78 year old male admitted 08/17/2018 following cardiac arrest (CPR x11 minutes, intubated in the field) s/p stenting and encephalopathy. Pt hit his head upon collapse. Pt was intubated for 7 days.  PMH: HTN, DM. Pt lives with his wife Dorian Pod, and was independent prior to admit.  BSE:  Pt presents with missing dentition, which adversely affects intelligibility of speech and ability to effectively masticate solid textures. CN exam is unremarkable. Pt appears to tolerate puree and soft solid (Dys2) textures, and has tolerated nectar thick liquids since initial BSE. Pt passed [place] this date. He was observed drinking water and Sprite via cup  and straw. No overt s/s aspiration observed. Will continue dys 2 diet until dentures can be brought in. Will advance to thin liquids and follow for readiness to advance and to provide education.   COG/COM: The Montreal Cognitive Assessment (MoCA) was administered. Pt scored 22/30, indicating moderate cognitive deficits for pt age and education (high school graduate). Points were lost on the following subtests: Executive  functions, Immediate and delayed recall, Verbal fluency/thought organization, and abstract reasoning. These deficits raise concern for pt ability to return to prior level of function. Skilled ST intervention is recommended during inpatient rehab stay to maximize cognitive linguistic functions and minimize caregiver burden.    Skilled Therapeutic Interventions          Bedside swallow evaluation and cognitive-linguistic evaluation completed this date.  SLP Assessment  Patient will need skilled Speech Language Pathology Services during CIR admission    Recommendations  SLP Diet Recommendations: Dysphagia 2 (Fine chop);Thin Liquid Administration via: Cup;Straw Medication Administration: Whole meds with puree Supervision: Staff to assist with self feeding;Full supervision/cueing for compensatory strategies;Patient able to self feed Compensations: Minimize environmental distractions;Slow rate;Small sips/bites Postural Changes and/or Swallow Maneuvers: Seated upright 90 degrees Oral Care Recommendations: Oral care BID Patient destination: Home Follow up Recommendations: 24 hour supervision/assistance Equipment Recommended: None recommended by SLP    SLP Frequency 1 to 3 out of 7 days   SLP Duration  SLP Intensity  SLP Treatment/Interventions 14-18 days  Minumum of 1-2 x/day, 30 to 90 minutes  Cognitive remediation/compensation;Speech/Language facilitation;Cueing hierarchy;Functional tasks;Therapeutic Activities;Patient/family education;Dysphagia/aspiration precaution training    Pain Pain Assessment Pain Scale: 0-10 Pain Score: 0-No pain  Prior Functioning Cognitive/Linguistic Baseline: Information not available Type of Home: Mobile home  Lives With: Spouse Available Help at Discharge: Family Education: 12th grade Vocation: Retired  Industrial/product designer Term Goals: Week 1: SLP Short Term Goal 1 (Week 1): Pt will tolerate dys2 diet and thin liquids without overt s/s aspiration or decline in  respiratory status. SLP Short Term Goal 2 (Week 1): Pt will demonstrate ability to complete basic reasoning, problem solving, and thought organization tasks with Mod A verbal cues SLP Short Term Goal 3 (Week 1): Pt will recall daily and functional information with Min A verbal and visual cues SLP Short Term Goal 4 (Week 1): Pt will complete semi-complex functional tasks regarding money and medication management with Mod A verbal and visual cues  Refer to Care Plan for Long Term Goals  Recommendations for other services: None   Discharge Criteria: Patient will be discharged from SLP if patient refuses treatment 3 consecutive times without medical reason, if treatment goals not met, if there is a change in medical status, if patient makes no progress towards goals or if patient is discharged from hospital.  The above assessment, treatment plan, treatment alternatives and goals were discussed and mutually agreed upon: by patient   Mikaia Janvier B. Quentin Ore, [facility], CCC-SLP Speech Language Pathologist  Shonna Chock 08/31/2018, 12:32 PM

## 2018-08-31 NOTE — Progress Notes (Signed)
Social Work  Social Work Assessment and Plan  Patient Details  Name: [name] [name] MRN: [id number] Date of Birth: May 23, 1940  Today's Date: 08/31/2018  Problem List:  Patient Active Problem List   Diagnosis Date Noted  . Debility 08/30/2018  . CAD S/P percutaneous coronary angioplasty 08/30/2018  . Atrial fibrillation, chronic 08/30/2018  . HTN (hypertension) 08/30/2018  . T2DM (type 2 diabetes mellitus) ([place]) 08/30/2018  . Acute diastolic congestive heart failure ([place])   . Aspiration pneumonia of both lungs ([place])   . Atrial fibrillation with rapid ventricular response ([place])   . Diabetes mellitus type 2 in nonobese (HCC)   . Dyslipidemia   . Diabetic retinopathy ([place]) 05/22/2017  . Peripheral vascular insufficiency ([place]) 03/18/2016  . Family history of heart disease 03/18/2016  . Right inguinal hernia 12/13/2013  . Essential hypertension 12/13/2013  . BPH (benign prostatic hyperplasia) 12/13/2013  . Hypothyroidism 08/07/2013  . Vitamin D deficiency 04/02/2013  . Goiter, nontoxic, multinodular   . Kidney stone   . Hyperlipidemia 01/15/2009  . ABNORMAL ELECTROCARDIOGRAM 01/15/2009  . Type 2 diabetes mellitus treated with insulin ([place]) 01/09/2009  . HIATAL HERNIA 01/09/2009  . BASAL CELL CARCINOMA, HX OF 01/09/2009  . DIVERTICULITIS, HX OF 01/09/2009   Past Medical History:  Past Medical History:  Diagnosis Date  . Basal cell carcinoma    left forehead, nose, neck  . Cataract   . Diabetes mellitus   . Diabetes mellitus without complication ([place])   . Diverticulitis   . Erectile dysfunction   . Gastroesophageal reflux disease with hiatal hernia   . Goiter, nontoxic, multinodular    with macrocyst  . Hiatal hernia    With reflex  . Hyperlipidemia   . Inguinal hernia    right side  . Kidney stone 1980  . Leg fracture    Left  . Liver hemangioma   . Nephrolithiasis   . Vitamin D deficiency    Past Surgical History:  Past Surgical History:  Procedure Laterality  Date  . BACK SURGERY  1984  . CATARACT EXTRACTION Bilateral   . CORONARY STENT INTERVENTION N/A 08/25/2018   Procedure: CORONARY STENT INTERVENTION;  Surgeon: Sherren Mocha, MD;  Location: [facility];  Service: Cardiovascular;  Laterality: N/A;  . EYE SURGERY    . LACERATION REPAIR  2008   Dr. Lenon Curt -to hand  . LEFT HEART CATH AND CORONARY ANGIOGRAPHY N/A 08/25/2018   Procedure: LEFT HEART CATH AND CORONARY ANGIOGRAPHY;  Surgeon: Sherren Mocha, MD;  Location: [facility];  Service: Cardiovascular;  Laterality: N/A;  . TIBIA FRACTURE SURGERY     left leg   Social History:  reports that he quit smoking about 35 years ago. His smoking use included cigarettes. He has a 27.00 pack-year smoking history. He has never used smokeless tobacco. He reports that he does not drink alcohol or use drugs.  Family / Support Systems Marital Status: Married Patient Roles: Spouse, Parent Spouse/Significant Other: Dorian Pod [phone] Children: Dean-son [id number] Other Supports: Friends and church members Anticipated Caregiver: Wife and son Ability/Limitations of Caregiver: Wife is in good health and son works Careers adviser: 24/7 Family Dynamics: Close knit family pt was very active and independent. Son is involved and can check on daily, but does work. They have friends and church members who are involved.  Social History Preferred language: English Religion: Baptist Cultural Background: No issues Education: HS Read: Yes Write: Yes Employment Status: Retired Public relations account executive Issues: No issues Guardian/Conservator: None-according to  MD pt is not fully capable of making his own decisions while here. Will contact wife and son if any decisions need to be made while here   Abuse/Neglect Abuse/Neglect Assessment Can Be Completed: Yes Physical Abuse: Denies Verbal Abuse: Denies Sexual Abuse: Denies Exploitation of patient/patient's resources: Denies Self-Neglect:  Denies  Emotional Status Pt's affect, behavior and adjustment status: Pt is motivated to improve and regain his independence while here. He is still processing all that has happened to him. He has always been independent and taken care of himself and helped his wife with all of the household duties Recent Psychosocial Issues: other health issues thojught was doing well until this happened Psychiatric History: No history deferred depression screen due to adjusting the the unit and still foggy. He feels he is clearing daily and getting better. Will ask team if needed, may for cognition if doesn't clear completely Substance Abuse History: No issues  Patient / Family Perceptions, Expectations & Goals Pt/Family understanding of illness & functional limitations: Pt and son can explain his issues and the need to get stronger and better. Both have spoken with the MD and feel they have a good understanding to his plan going forward. Both hope for the best. Premorbid pt/family roles/activities: husband, father, grandfather, retiree, church member, etc Anticipated changes in roles/activities/participation: resume Pt/family expectations/goals: Pt states: " I want to do well and not make my wife help me when I leave here."  Son states: " We will do what he needs done when he comes home."  [employer]: None Premorbid Home Care/DME Agencies: None Transportation available at discharge: Family pt was driving prior to admission Resource referrals recommended: Support group (specify)  Discharge Planning Living Arrangements: Spouse/significant other Support Systems: Spouse/significant other, Children, Friends/neighbors, Social worker community Type of Residence: Private residence Insurance Resources: Multimedia programmer (specify)([place] Medicare) Museum/gallery curator Resources: Fish farm manager, Family Support Financial Screen Referred: No Living Expenses: Lives with family Money Management:  Spouse, Patient Does the patient have any problems obtaining your medications?: No Home Management: Both he and wife Patient/Family Preliminary Plans: Return home with wife who is able to assist if needed. Their son will also help if needed. WIll await therapy team evaluations and work on the best plan for pt. He looks to be moving well and should do well here on rehab. He seems to be clearing and asking to call his wife. Social Work Anticipated Follow Up Needs: HH/OP, Support Group  Clinical Impression Pleasant gentleman who is motivated to improve and recover from his health scare. He voiced the choking on the donuts started all of this. His wife and son are involved and will assist him at discharge. Will await teams' evaluations and work on discharge needs.  Elease Hashimoto 08/31/2018, 9:21 AM

## 2018-08-31 NOTE — Progress Notes (Signed)
Inpatient Rehabilitation  Patient information reviewed and entered into eRehab system by Jalayia Bagheri M. Tiana Sivertson, M.A., CCC/SLP, PPS Coordinator.  Information including medical coding, functional ability and quality indicators will be reviewed and updated through discharge.    

## 2018-08-31 NOTE — Progress Notes (Signed)
    Transitional Care Management Chart Review  08/31/2018  Chart review performed on DERELL BRUUN status post hospital discharge from [facility] on 08/29/2018. he was admitted for aspiration pneumonia and does not meet  internal criteria for Transitional Care Management for this discharge due to being discharged to [facility] facility. Our office will contact Mr Bhakta for transitional care management and possibly chronic care management once he is discharged.   Chong Sicilian, RN-BC, BSN Nurse Case Manager [place] [phone]

## 2018-09-01 ENCOUNTER — Inpatient Hospital Stay (HOSPITAL_COMMUNITY): Payer: Medicare PPO | Admitting: Occupational Therapy

## 2018-09-01 ENCOUNTER — Inpatient Hospital Stay (HOSPITAL_COMMUNITY): Payer: Medicare PPO

## 2018-09-01 ENCOUNTER — Inpatient Hospital Stay (HOSPITAL_COMMUNITY): Payer: Medicare PPO | Admitting: Physical Therapy

## 2018-09-01 LAB — GLUCOSE, CAPILLARY
Glucose-Capillary: 128 mg/dL — ABNORMAL HIGH (ref 70–99)
Glucose-Capillary: 178 mg/dL — ABNORMAL HIGH (ref 70–99)
Glucose-Capillary: 231 mg/dL — ABNORMAL HIGH (ref 70–99)
Glucose-Capillary: 251 mg/dL — ABNORMAL HIGH (ref 70–99)

## 2018-09-01 NOTE — Plan of Care (Signed)
  Problem: Consults Goal: Skin Care Protocol Initiated - if Braden Score 18 or less Description If consults are not indicated, leave blank or document N/A Outcome: Progressing Goal: Diabetes Guidelines if Diabetic/Glucose > 140 Description If diabetic or lab glucose is > 140 mg/dl - Initiate Diabetes/Hyperglycemia Guidelines & Document Interventions  Outcome: Progressing Goal: RH GENERAL PATIENT EDUCATION Description See Patient Education module for education specifics. Outcome: Progressing   Problem: RH BOWEL ELIMINATION Goal: RH STG MANAGE BOWEL WITH ASSISTANCE Description STG Manage Bowel with mod I Assistance.  Outcome: Progressing Goal: RH STG MANAGE BOWEL W/MEDICATION W/ASSISTANCE Description STG Manage Bowel with Medication with mod I Assistance.  Outcome: Progressing   Problem: RH BLADDER ELIMINATION Goal: RH STG MANAGE BLADDER WITH ASSISTANCE Description STG Manage Bladder With mod I Assistance  Outcome: Progressing   Problem: RH SAFETY Goal: RH STG ADHERE TO SAFETY PRECAUTIONS W/ASSISTANCE/DEVICE Description STG Adhere to Safety Precautions With supervision/cues Assistance/Device. Outcome: Progressing   Problem: RH PAIN MANAGEMENT Goal: RH STG PAIN MANAGED AT OR BELOW PT'S PAIN GOAL Description Pain scale <2/10  Outcome: Progressing   Problem: RH KNOWLEDGE DEFICIT GENERAL Goal: RH STG INCREASE KNOWLEDGE OF SELF CARE AFTER HOSPITALIZATION Description Pt will be able to direct care at discharge including management of HTN, HLD and DM with cues/supervision of use of educational resources provided  Outcome: Progressing   Problem: Consults Goal: RH STROKE PATIENT EDUCATION Description See Patient Education module for education specifics  Outcome: Progressing

## 2018-09-01 NOTE — Progress Notes (Signed)
Speech Language Pathology Daily Session Note  Patient Details  Name: [name] [name] MRN: [id number] Date of Birth: 11-04-1940  Today's Date: 09/01/2018 SLP Individual Time: 6770-3403 SLP Individual Time Calculation (min): 58 min  Short Term Goals: Week 1: SLP Short Term Goal 1 (Week 1): Pt will tolerate dys2 diet and thin liquids without overt s/s aspiration or decline in respiratory status. SLP Short Term Goal 2 (Week 1): Pt will demonstrate ability to complete basic reasoning, problem solving, and thought organization tasks with Mod A verbal cues SLP Short Term Goal 3 (Week 1): Pt will recall daily and functional information with Min A verbal and visual cues SLP Short Term Goal 4 (Week 1): Pt will complete semi-complex functional tasks regarding money and medication management with Mod A verbal and visual cues  Skilled Therapeutic Interventions:Skilled ST services focused on cognitive skills. Pt communicated with nurse about requesting DNR status, stating " when its my time to go I want to go." SLP facilitated semi-complex problem solving skills, utilizing medication management task, pt required mod A verbal cues verbal and functional problem solving, largely due to internal distractions of pt stating noncompliance with medication. " I am not going to take lipitor at home so I am not putting this pill in." SLP provided education of hospital events resulting in prescribed medication and references pt's lab results. Pt agreed to not change medication following discharge and will refer to primary care doctor to adjust medication. Pt was left with call bell within reach and chair alarm set. SLP recommends to continue skilled ST services.     Pain Pain Assessment Pain Score: 0-No pain  Therapy/Group: Individual Therapy  [name]  [facility] 09/01/2018, 12:23 PM

## 2018-09-01 NOTE — Progress Notes (Signed)
[facility] PHYSICAL MEDICINE & REHABILITATION PROGRESS NOTE   Subjective/Complaints: Slept well, no bowel or bladder issues  View of systems negative for chest pain shortness of breath nausea vomiting diarrhea or constipation   Objective:   Dg Chest 2 View  Result Date: 08/30/2018 CLINICAL DATA:  Aspiration pneumonia. Choking episode on 08/17/2018. Cardiac arrest. EXAM: CHEST - 2 VIEW COMPARISON:  11/24/2017 FINDINGS: Heart size is normal. Aortic atherosclerosis is noted. Coronary artery stent. There is atelectasis in both lower lobes, left worse than right. Small effusions in the posterior costophrenic angles. IMPRESSION: Bilateral lower lobe atelectasis/pneumonia left more than right with small effusions. Electronically Signed   By: Nelson Chimes M.D.   On: 08/30/2018 19:17   Recent Labs    08/30/18 0156 08/31/18 0503  WBC 8.8 8.0  HGB 9.4* 9.9*  HCT 30.1* 31.4*  PLT 345 403*   Recent Labs    08/30/18 0156 08/31/18 0503  NA 144 141  K 3.6 4.0  CL 111 107  CO2 24 25  GLUCOSE 150* 185*  BUN 23 20  CREATININE 1.06 0.99  CALCIUM 8.7* 8.9    Intake/Output Summary (Last 24 hours) at 09/01/2018 1914 Last data filed at 09/01/2018 0746 Gross per 24 hour  Intake 950 ml  Output 700 ml  Net 250 ml     Physical Exam: Vital Signs Blood pressure 113/71, pulse 62, temperature 98 F (36.7 C), temperature source Oral, resp. rate 18, height 5\' 10"  (1.778 m), weight 69.7 kg, SpO2 100 %.   General: No acute distress, edentulous Mood and affect are appropriate Heart: Regular rate and rhythm no rubs murmurs or extra sounds Lungs: Clear to auscultation, breathing unlabored, no rales or wheezes Abdomen: Positive bowel sounds, soft nontender to palpation, nondistended Extremities: No clubbing, cyanosis, or edema Skin: No evidence of breakdown, no evidence of rash Neurologic: Cranial nerves II through XII intact, motor strength is 4/5 in bilateral deltoid, bicep, tricep, grip, hip  flexor, knee extensors, ankle dorsiflexor and plantar flexor Sensory exam normal sensation to light touch and proprioception in bilateral upper and lower extremities Cerebellar exam normal finger to nose to finger as well as heel to shin in bilateral upper and lower extremities Unchanged Musculoskeletal: Full range of motion in all 4 extremities. No joint swelling Oriented x3 Dysarthria  Assessment/Plan: 1. Functional deficits secondary to Debility and encephalopathy which require 3+ hours per day of interdisciplinary therapy in a comprehensive inpatient rehab setting.  Physiatrist is providing close team supervision and 24 hour management of active medical problems listed below.  Physiatrist and rehab team continue to assess barriers to discharge/monitor patient progress toward functional and medical goals  Care Tool:  Bathing    Body parts bathed by patient: Right arm, Left arm, Chest, Abdomen, Right upper leg, Left upper leg, Face   Body parts bathed by helper: Left lower leg, Right lower leg, Front perineal area, Buttocks     Bathing assist Assist Level: Maximal Assistance - Patient 24 - 49%     Upper Body Dressing/Undressing Upper body dressing   What is the patient wearing?: Pull over shirt    Upper body assist Assist Level: Moderate Assistance - Patient 50 - 74%    Lower Body Dressing/Undressing Lower body dressing      What is the patient wearing?: Pants     Lower body assist Assist for lower body dressing: Maximal Assistance - Patient 25 - 49%     Toileting Toileting    Toileting assist Assist for  toileting: Total Assistance - Patient < 25%     Transfers Chair/bed transfer  Transfers assist     Chair/bed transfer assist level: Maximal Assistance - Patient 25 - 49%     Locomotion Ambulation   Ambulation assist      Assist level: Maximal Assistance - Patient 25 - 49% Assistive device: No Device Max distance: 3   Walk 10 feet  activity   Assist  Walk 10 feet activity did not occur: Safety/medical concerns(balance deficits)        Walk 50 feet activity   Assist Walk 50 feet with 2 turns activity did not occur: Safety/medical concerns(balance deficits)         Walk 150 feet activity   Assist Walk 150 feet activity did not occur: Safety/medical concerns(balance deficits)         Walk 10 feet on uneven surface  activity   Assist Walk 10 feet on uneven surfaces activity did not occur: Safety/medical concerns(balance deficits)         Wheelchair     Assist Will patient use wheelchair at discharge?: Yes Type of Wheelchair: Manual    Wheelchair assist level: Moderate Assistance - Patient 50 - 74% Max wheelchair distance: 25    Wheelchair 50 feet with 2 turns activity    Assist    Wheelchair 50 feet with 2 turns activity did not occur: Safety/medical concerns(fatigue)       Wheelchair 150 feet activity     Assist Wheelchair 150 feet activity did not occur: Safety/medical concerns(fatigue)        Medical Problem List and Plan: 1.  Debility and acute encephalopathy secondary to cardiac arrest status post stenting/multi-medical             CIR  PT OT speech- dysarthria may be due to lack of dentures 2.  Antithrombotics: -DVT/anticoagulation: Eliquis             -antiplatelet therapy: Aspirin 81 mg daily, Plavix 75 mg daily.  Continue aspirin for 1 month and then Plavix for at least 6 months. 3. Pain Management: Tylenol as needed 4. Mood: Provide emotional support             -antipsychotic agents: Seroquel 25 mg nightly 5. Neuropsych: This patient is not capable of making decisions on his own behalf. 6. Skin/Wound Care: Routine skin checks 7. Fluids/Electrolytes/Nutrition: Routine in and outs.  Follow-up BMP tomorrow a.m. 8.  Acute diastolic congestive heart failure.  Monitor for signs of fluid overload 9.  PAF with RVR.  Continue Eliquis.  Amiodarone adjusted as per  cardiology services.  Transition to amiodarone 200 mg daily 5/14 10.  Hypertension.  Norvasc 5 mg daily, Avapro 75 mg daily, Lopressor 50 mg twice daily.  Monitor with increased mobility Vitals:   08/31/18 1940 09/01/18 0442  BP: 133/72 113/71  Pulse: 65 62  Resp: 18 18  Temp: 98 F (36.7 C) 98 F (36.7 C)  SpO2: 98% 100%   11.  Diabetes mellitus.  Hemoglobin A1c 8.7.  Levemir 20 units twice daily.  Check blood sugars before meals and at bedtime.  Monitor with increased mobility. CBG (last 3)  Recent Labs    08/31/18 1623 08/31/18 2043 09/01/18 0618  GLUCAP 239* 291* 128*  Elevated will need to adjust insulin 12.  Dysphagia.  Dysphagia #2 nectar liquids.  Follow-up speech therapy.  Advance as tolerated. 13.  Aspiration pneumonia.  IV cefepime completed 08/29/2018.  Chest x-ray on 08/25/2018 reviewed showing bilateral opacities.repeat chest x-ray  demonstrates atelectasis, patient afebrile with normal white count will monitor. 14.  Hyperlipidemia.  Lipitor 15.  Hypothyroidism.  Synthroid  LOS: 2 days A FACE TO [place] E Katrenia Alkins 09/01/2018, 9:37 AM

## 2018-09-01 NOTE — IPOC Note (Signed)
Overall Plan of Care [facility]) Patient Details Name: [name] [name] MRN: [id number] DOB: 05-01-1940  Admitting Diagnosis: <principal problem not specified>  Hospital Problems: Active Problems:   Debility   Acute diastolic congestive heart failure (HCC)   Aspiration pneumonia of both lungs (HCC)   Atrial fibrillation with rapid ventricular response (HCC)   Diabetes mellitus type 2 in nonobese ([place])   Dyslipidemia     Functional Problem List: Nursing Endurance, Medication Management, Safety, Bladder, Bowel, Nutrition  PT Balance, Behavior, Endurance, Motor, Safety  OT Balance, Behavior, Cognition, Endurance, Motor, Safety  SLP Behavior, Cognition, Endurance, Nutrition, Safety  TR         Basic ADL's: OT Eating, Grooming, Bathing, Toileting, Dressing     Advanced  ADL's: OT       Transfers: PT Bed Mobility, Bed to Chair, Car, Patent attorney, Agricultural engineer: PT Ambulation, Emergency planning/management officer, Stairs     Additional Impairments: OT None  SLP Swallowing, Communication, Social Cognition expression Problem Solving, Memory, Awareness, Attention  TR      Anticipated Outcomes Item Anticipated Outcome  Self Feeding Supervision  Swallowing  Dys 3 (mech soft)/ thin liquid diet with supervision   Basic self-care  Supervision  Toileting  Supervision   Bathroom Transfers Supervision  Bowel/Bladder  Patient will be able to toilet with mod I assistance  Transfers  supervision basic and car  Locomotion  supervision gait x 100' with LRAD controlled env, x 50' home env; superivsion w/c x 150' level tile controlled  Communication  Mod I  Cognition  Supervision  Pain  pain will be controlled to an acceptable level  Safety/Judgment  Patient will have no fall with injury while on rehab   Therapy Plan: PT Intensity: Minimum of 1-2 x/day ,45 to 90 minutes PT Frequency: 5 out of 7 days PT Duration Estimated Length of Stay: 14-18 OT Intensity: Minimum of 1-2  x/day, 45 to 90 minutes OT Frequency: 5 out of 7 days OT Duration/Estimated Length of Stay: 14-18 days SLP Intensity: Minumum of 1-2 x/day, 30 to 90 minutes SLP Frequency: 1 to 3 out of 7 days SLP Duration/Estimated Length of Stay: 14-18 days   Due to the current state of emergency, patients may not be receiving their 3-hours of Medicare-mandated therapy.   Team Interventions: Nursing Interventions Bowel Management, Bladder Management, Dysphagia/Aspiration Precaution Training, Disease Management/Prevention, Medication Management, Discharge Planning  PT interventions Ambulation/gait training, Stair training, Training and development officer, DME/adaptive equipment instruction, Patient/family education, Therapeutic Activities, Wheelchair propulsion/positioning, Cognitive remediation/compensation, Psychosocial support, Therapeutic Exercise, Community reintegration, Functional mobility training, UE/LE Strength taining/ROM, Discharge planning, Neuromuscular re-education, Splinting/orthotics, UE/LE Coordination activities  OT Interventions Training and development officer, Cognitive remediation/compensation, Community reintegration, Discharge planning, DME/adaptive equipment instruction, Functional mobility training, Neuromuscular re-education, Patient/family education, Psychosocial support, Self Care/advanced ADL retraining, Therapeutic Activities, Therapeutic Exercise, UE/LE Strength taining/ROM, UE/LE Coordination activities, Wheelchair propulsion/positioning  SLP Interventions Cognitive remediation/compensation, Speech/Language facilitation, Cueing hierarchy, Functional tasks, Therapeutic Activities, Patient/family education, Dysphagia/aspiration precaution training  TR Interventions    SW/CM Interventions Discharge Planning, Psychosocial Support, Patient/Family Education   Barriers to Discharge MD  Medical stability  Nursing Incontinence    PT      OT      SLP      SW       Team Discharge  Planning: Destination: PT-Home ,OT- Home , SLP-Home Projected Follow-up: PT-Home health PT, OT-  Home health OT, SLP-24 hour supervision/assistance Projected Equipment Needs: PT-To be determined, OT- To be determined, SLP-None recommended  by SLP Equipment Details: PT- , OT-  Patient/family involved in discharge planning: PT- Patient,  OT-Patient, SLP-Patient  MD ELOS: 7d Medical Rehab Prognosis:  Good Assessment:  78 year old right-handed male with history of hypertension, diabetes mellitus.  Per chart review and some history from patient, patient lives with spouse independent prior to admission.  There is also a son in the area and family plans to provide assistance as needed.  Presented 08/17/2018 after witnessed cardiac arrest.  CPR initiated by bystander.  Per report patient had initially complained of fatigue possibly low blood sugar while trying to eat a muffin before witnessed arrest.  He reportedly struck his head when he collapsed.  He received 11 minutes of CPR.  He was intubated at the scene.  Admit 08/17/2018.  COVID negative.  Work-up revealed leukocytosis with WBCs 18,000 and troponin 0.03.  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  He was extubated 08/23/2018.  Hospital course complicated by A. fib with RVR.  Maintained on aspirin, Plavix as well as Eliquis and amiodarone which has been adjusted with follow-up per cardiology services.  Acute on chronic anemia hemoglobin 9.3-10.7.  Patient did complete a 7-day course of cefepime 08/29/2018 for question aspiration pneumonia.  He is on a dysphagia #2 nectar thick liquid diet.  Patient with ongoing bouts of confusion and restlessness suspect encephalopathy related to cardiac arrest.  An EEG was completed that was negative for seizure   Now requiring 24/7 Rehab RN,MD, as well as CIR level PT, OT .  Treatment team will focus on ADLs and mobility with goals set at Sup See Team Conference Notes for  weekly updates to the plan of care

## 2018-09-01 NOTE — Progress Notes (Signed)
Physical Therapy Session Note  Patient Details  Name: [name] [name] MRN: [id number] Date of Birth: 10-09-1940  Today's Date: 09/01/2018 PT Individual Time: 4196-2229 PT Individual Time Calculation (min): 60 min   Short Term Goals: Week 1:  PT Short Term Goal 1 (Week 1): pt will move supine>< sit with supervision PT Short Term Goal 2 (Week 1): pt will transfer wiht mod assist of 1 person PT Short Term Goal 3 (Week 1): pt will propel wc x 50' with min assist PT Short Term Goal 4 (Week 1): pt will stand with bil UE support x 3 minutes and mod assist, during functional activity PT Short Term Goal 5 (Week 1): pt will initiate stairs  Skilled Therapeutic Interventions/Progress Updates:    Pt received supine in bed and agreeable to therapy session. Supine>sit with close supervision for safety. Pt donned B shoes with mod assist and close supervision for sitting balance. Squat pivot transfer EOB>w/c with mod assist for pivoting hips and balance. Transported to/from gym in w/c. Squat pivot w/c>EOM with min/mod assist for balance. Ambulated 43ft, no AD, with mod assist progressed to max assist at end due to increased pt fatigue with decreased B LE foot clearance, scissoring steps, and gradual increased trunk flexion. Verbal cuing with visual demonstration on improved gait mechanics. Ambulated 86ft, no AD, with min assist progressed to mod assist for balance with pt demonstrating improving foot clearance, decreased scissoring, and improving trunk upright. Performed dynamic standing balance task of alternate B LE foot taps on 6" step with external targets to increase distance between his feet - mod assist throughout for balance. Attempted retraining hip strategy against a wall and despite max multimodal cuing and visual demonstration pt unable to motor plan to accomplish task without mod assistance. Performed static standing with bilateral forefoot on red wedge and max cuing for anterior weight shift with mirror  for visual feedback - required max assist initially for balance due to posterior lean/LOB progressed to CGA  - progressed to adding reaching task for card matching with CGA initially progressed to min assist and intermittent mod assist due to posterior lean with fatigue. Ascended/descended 8 steps using bilateral handrails with mod assist for balance throughout due to posterior lean and cuing for step-to pattern and placing foot fully on the step for increased safety. Cuing and education throughout session on increased safety awareness and regaining balance prior to continuing tasks. Ambulated 56ft using RW with min assist for balance throughout and cuing for targeting steps to front wheels to increased BOS due to scissoring. Transported back to room in w/c. Stand pivot transfer w/c>EOB, no AD, with min/mod assist for balance due to posterior lean. Sit>supine supervision. Pt left supine in bed with needs in reach and bed alarm on.   Therapy Documentation Precautions:  Precautions Precautions: Fall Restrictions Weight Bearing Restrictions: No  Pain:  Denies pain during session.   Therapy/Group: Individual Therapy  Tawana Scale, PT, DPT 09/01/2018, 3:45 PM

## 2018-09-01 NOTE — Progress Notes (Signed)
Patient requests to change code status to DNR.  Explained what that meant with patient and he is stated that those ARE his wishes.  Notified Marlowe Shores, PA, code status changed in system to DNR and placed purple DNR band on patient.  Brita Romp, RN

## 2018-09-01 NOTE — Progress Notes (Signed)
Occupational Therapy Session Note  Patient Details  Name: [name] [name] MRN: [id number] Date of Birth: 1941-01-13  Today's Date: 09/01/2018 OT Individual Time: 9381-0175 OT Individual Time Calculation (min): 60 min    Short Term Goals: Week 1:  OT Short Term Goal 1 (Week 1): Pt will complete toilet transfer with mod A OT Short Term Goal 2 (Week 1): Pt will sit EOB for 5 mins with no more than min A for balance OT Short Term Goal 3 (Week 1): Pt will complete 1 step of LB dressing task OT Short Term Goal 4 (Week 1): Pt will complete sit<>stand with mod A in preparation for BADL tasks  Skilled Therapeutic Interventions/Progress Updates:    Pt completed grooming task of shaving to start session.  Min assist for supine to sit EOB with mod assist needed for initial sitting balance secondary to posterior LOB.  He then transferred to the wheelchair at bedside with mod assist as well.  Had pt work on shaving in standing at the sink, where he was able to maintain standing endurance for over 4 mins.  Increased trunk and right knee flexion noted in standing however.  Min assist for static standing while completing shaving task.  He then donned his pullover shirt with setup as well as donning pants with min assist sit to stand.  Next part of session, he was taken down to the dayroom via wheelchair where he transferred to the therapy mat with mod assist stand pivot.  He completed 2 sets of 10 reps for stand to squat position with mod assist for balance as well as mod facilitation to achieve full upright trunk and hip extension.  He then completed functional mobility 10' with max assist hand held.  Increased LOB to the right and posteriorly during mobility.  Increased posterior LOB when standing statically without UE support.  Returned to the room at end of session with pt remaining sitting up in the wheelchair and call button and phone in reach with safety belt in place.    Therapy Documentation Precautions:   Precautions Precautions: Fall Restrictions Weight Bearing Restrictions: No   Pain: Pain Assessment Pain Scale: Faces Pain Score: 0-No pain ADL: See Care Tool Section for some details of ADL  Therapy/Group: Individual Therapy  Garnett Nunziata OTR/L 09/01/2018, 12:26 PM

## 2018-09-02 ENCOUNTER — Inpatient Hospital Stay (HOSPITAL_COMMUNITY): Payer: Medicare PPO | Admitting: Physical Therapy

## 2018-09-02 ENCOUNTER — Inpatient Hospital Stay (HOSPITAL_COMMUNITY): Payer: Medicare PPO | Admitting: Occupational Therapy

## 2018-09-02 DIAGNOSIS — I5031 Acute diastolic (congestive) heart failure: Secondary | ICD-10-CM

## 2018-09-02 LAB — GLUCOSE, CAPILLARY
Glucose-Capillary: 157 mg/dL — ABNORMAL HIGH (ref 70–99)
Glucose-Capillary: 290 mg/dL — ABNORMAL HIGH (ref 70–99)
Glucose-Capillary: 242 mg/dL — ABNORMAL HIGH (ref 70–99)
Glucose-Capillary: 164 mg/dL — ABNORMAL HIGH (ref 70–99)

## 2018-09-02 MED ORDER — ONDANSETRON HCL 4 MG PO TABS
4.0000 mg | ORAL_TABLET | Freq: Three times a day (TID) | ORAL | Status: DC | PRN
  Administered 2018-09-02: 4 mg via ORAL
  Filled 2018-09-02: qty 1

## 2018-09-02 MED ORDER — CALCIUM CARBONATE ANTACID 500 MG PO CHEW
1.0000 | CHEWABLE_TABLET | ORAL | Status: DC | PRN
  Administered 2018-09-02: 200 mg via ORAL
  Filled 2018-09-02: qty 1

## 2018-09-02 NOTE — Progress Notes (Signed)
Notified MD about patient nausea and order received to give medication for Nausea per Dr. Inda Merlin; order initiated and patient made aware

## 2018-09-02 NOTE — Progress Notes (Signed)
C/O "sour stomach,  I usually take tums." Paged Dr. Inda Merlin and orders received. Refused supper and only drank 120cc's of diet coke. Incontinent of urine X 1. Patrici Ranks A

## 2018-09-02 NOTE — Progress Notes (Signed)
Notified Dr. Inda Merlin, provider for today about patient complaining of bilateral ears feeling stopped up. Patients denies pain or discharge. Waiting for prn Flonase to be delivered by pharm; patient made aware. Will continue to monitor

## 2018-09-02 NOTE — Progress Notes (Signed)
Occupational Therapy Session Note  Patient Details  Name: [name] [name] MRN: [id number] Date of Birth: 01-27-1941  Today's Date: 09/02/2018 OT Individual Time: 1230-1330 OT Individual Time Calculation (min): 60 min    Short Term Goals: Week 1:  OT Short Term Goal 1 (Week 1): Pt will complete toilet transfer with mod A OT Short Term Goal 2 (Week 1): Pt will sit EOB for 5 mins with no more than min A for balance OT Short Term Goal 3 (Week 1): Pt will complete 1 step of LB dressing task OT Short Term Goal 4 (Week 1): Pt will complete sit<>stand with mod A in preparation for BADL tasks  Skilled Therapeutic Interventions/Progress Updates: Patient with great sense of humor but impulsive and with very decreased safety awareness.  He was asking to toilet with nursing upon approach for OT.   He participated this session as follows:  Cognition: decreased safety awareness>  Safety reminders to slow down, become aware of right lateral leans when standing and walking, to stay within his walker for functional mobility; not to take off walking by himself.  Toilet transfer via RW and 3:1 over toilet=Min A and reminders to stay within his walker rather than positioning way ahead Toileting=moderate assistance AS HE TENDED TO RIGHT LATERALLY LEAN WHEN FOCUSING ON STANDING TO FASTEN HIS PANTS  UB bathing and dressing=setup LB bathing and dressing (sit to stand at sink)=moderate assistance due to tendence to right laterally lean as he fatigued  Self feeding=setup.   Only ate frosted flakes and stated "I am not having any chicken, beef or pork due to the CVirus at these plants and everywhere else."   RN in room and aware of his low intake.   She and this clinician were able to convince him to eat anything more, other than a bowl of applesauce.  Patient was left seated in his recliner with chair alarm in place.  He was reminded not to get up by himself.   He demonstrated that he knew how to use the call bell  accurately.  Call bell was positioned within his reach.  Continue OT Plan of Care      Therapy Documentation Precautions:  Precautions Precautions: Fall Restrictions Weight Bearing Restrictions: No   Pain:denied    Therapy/Group: Individual Therapy  Alfredia Ferguson [facility] 09/02/2018, 2:47 PM

## 2018-09-02 NOTE — Progress Notes (Signed)
Physical Therapy Session Note  Patient Details  Name: [name] [name] MRN: [id number] Date of Birth: [birth year]  Today's Date: 09/02/2018 PT Individual Time: 0804-0902 PT Individual Time Calculation (min): 58 min   and  Today's Date: 09/02/2018 PT Missed Time: 60 Minutes Missed Time Reason: Patient ill (Comment)(nausea)  Short Term Goals: Week 1:  PT Short Term Goal 1 (Week 1): pt will move supine>< sit with supervision PT Short Term Goal 2 (Week 1): pt will transfer wiht mod assist of 1 person PT Short Term Goal 3 (Week 1): pt will propel wc x 50' with min assist PT Short Term Goal 4 (Week 1): pt will stand with bil UE support x 3 minutes and mod assist, during functional activity PT Short Term Goal 5 (Week 1): pt will initiate stairs  Skilled Therapeutic Interventions/Progress Updates:   Session 1: Pt received sitting in recliner eating breakfast with nursing staff present and pt agreeable to therapy session. No reports of pain during session. Performed sit>stand recliner to RW with CGA for balance and ambulated ~45ft using RW with min assist for balance, continues to demonstrate sustained knee flexion, anterior trunk flexion, and narrow/scissoring BOS during gait. Transported to/from gym in w/c. Stand pivot transfer w/c>EOM using RW with min assist for balance. Patient participated in [facility] and demonstrates increased fall risk as noted by score of 15/56. (<36= high risk for falls, close to 100%) Pt educated on purpose of test, test results, recommendation for AD, and need for continued skilled therapy to address balance impairments. Performed standing balance task of narrow BOS on firm surface, no UE support able to stand 13 seconds prior to posterior LOB - pt continued to demonstrate repetitive posterior LOB despite mirror for visual feedback, max multimodal cuing for anterior weight shift and manual facilitation. Transitioned to tall kneeling with L UE support on bench focusing on  trunk upright progressed to repeated raise/lowering targeting hip extensor activation 2 sets of 10 repetitions. Transitioned back to standing with narrow BOS with pt demonstrating decreased posterior LOB progressed to reaching toward external targets. Performed alternate B LE foot taps on flat targets on floor with mod assist due to posterior lean/LOB. Pt became emotional during session stating he is "dealing with a lot" reporting his wife calls and crys on the phone daily because she is worried about him and pt reports he is ready to go home and be with her. Therapist provided emotional support and educated pt on purpose of therapy and PT goals. Transported back to room in w/c. Performed stand pivot transfer w/c to EOB using bedrails with min assist for balance. Sit>supine with supervision. Pt left supine in bed with needs in reach and bed alarm on.   Session 2: Upon therapist arrival to pt's room he was lying supine in bed asleep with vomit bag in lap - upon wakening pt reports he has "upset stomach" and defers therapy at this time. RN reports minimal vomiting within ~1 hr prior to therapist arrival. Missed 60 minutes.  Therapy Documentation Precautions:  Precautions Precautions: Fall Restrictions Weight Bearing Restrictions: No  Session 1 Balance: Balance Balance Assessed: Yes Standardized Balance Assessment Standardized Balance Assessment: Berg Balance Test Berg Balance Test Sit to Stand: Able to stand  independently using hands Standing Unsupported: Able to stand 2 minutes with supervision Sitting with Back Unsupported but Feet Supported on Floor or Stool: Able to sit 2 minutes under supervision Stand to Sit: Controls descent by using hands Transfers: Able to transfer with  verbal cueing and /or supervision Standing Unsupported with Eyes Closed: Needs help to keep from falling(posterior LOB upon first try, on 3rd try could stand 10 seconds with close supervision) Standing Ubsupported with  Feet Together: Needs help to attain position and unable to hold for 15 seconds(R lateral LOB) From Standing, Reach Forward with Outstretched Arm: Reaches forward but needs supervision From Standing Position, Pick up Object from Floor: Unable to try/needs assist to keep balance From Standing Position, Turn to Look Behind Over each Shoulder: Needs assist to keep from losing balance and falling Turn 360 Degrees: Needs assistance while turning Standing Unsupported, Alternately Place Feet on Step/Stool: Needs assistance to keep from falling or unable to try Standing Unsupported, One Foot in Front: Loses balance while stepping or standing Standing on One Leg: Unable to try or needs assist to prevent fall Total Score: 15  Static Sitting Balance Static Sitting - Balance Support: Feet supported Static Sitting - Level of Assistance: 5: Stand by assistance Dynamic Sitting Balance Dynamic Sitting - Balance Support: Feet supported;During functional activity Dynamic Sitting - Level of Assistance: 4: Min assist Static Standing Balance Static Standing - Balance Support: During functional activity;No upper extremity supported Static Standing - Level of Assistance: 3: Mod assist;2: Max assist(to prevent LOB) Dynamic Standing Balance Dynamic Standing - Balance Support: During functional activity;No upper extremity supported Dynamic Standing - Level of Assistance: 3: Mod assist;2: Max assist   Therapy/Group: Individual Therapy  Tawana Scale, PT, DPT 09/02/2018, 8:30 AM

## 2018-09-02 NOTE — Progress Notes (Signed)
Patient alert and oriented this shift and denies pain or distress.Patient up most of shift talking on phone with family .Appetite appropriate for breakfast and lunch.  Encouraged oral intake and importance of eating this shift. Patient nausea X1, Gingerale given and no other episodes this shift. .No s/s of hypoglycemia. No other episode of patient reporting clogged up ears. Will continue to monitor

## 2018-09-02 NOTE — Progress Notes (Signed)
[name] [name] is a 78 y.o. male admitted for CIR with debility following cardiac arrest and acute encephalopathy  Past Medical History:  Diagnosis Date  . Basal cell carcinoma    left forehead, nose, neck  . Cataract   . Diabetes mellitus   . Diabetes mellitus without complication ([place])   . Diverticulitis   . Erectile dysfunction   . Gastroesophageal reflux disease with hiatal hernia   . Goiter, nontoxic, multinodular    with macrocyst  . Hiatal hernia    With reflex  . Hyperlipidemia   . Inguinal hernia    right side  . Kidney stone 1980  . Leg fracture    Left  . Liver hemangioma   . Nephrolithiasis   . Vitamin D deficiency      Subjective: No new complaints. No new problems. Slept well.  Only complaint today is ear  fullness and a sense that his ears are " clogged up".  Objective: Vital signs in last 24 hours: Temp:  [97.1 F (36.2 C)-98 F (36.7 C)] 98 F (36.7 C) (05/16 0330) Pulse Rate:  [63-67] 67 (05/16 0330) Resp:  [16-19] 16 (05/16 0330) BP: (121-142)/(56-66) 134/64 (05/16 0330) SpO2:  [99 %-100 %] 100 % (05/16 0330) Weight change:  Last BM Date: 09/01/18  Intake/Output from previous day: 05/15 0701 - 05/16 0700 In: 887 [P.O.:887] Out: 1050 [Urine:1050] Last cbgs: CBG (last 3)  Recent Labs    09/01/18 1636 09/01/18 2116 09/02/18 0628  GLUCAP 231* 251* 157*   Patient Vitals for the past 24 hrs:  BP Temp Temp src Pulse Resp SpO2  09/02/18 0330 134/64 98 F (36.7 C) Oral 67 16 100 %  09/01/18 1943 (!) 121/56 (!) 97.5 F (36.4 C) Oral 63 16 99 %  09/01/18 1603 (!) 142/66 (!) 97.1 F (36.2 C) Oral 67 19 -   Lab Results  Component Value Date   HGBA1C 8.7 (H) 08/27/2018     Physical Exam General: No apparent distress   HEENT: not dry; both canals examined with otoscope and are free of cerumen Lungs: Normal effort. Lungs clear to auscultation, no crackles or wheezes. Cardiovascular: Regular rate and rhythm, no edema Abdomen: S/NT/ND;  BS(+) Musculoskeletal:  unchanged Neurological: No new neurological deficits with mild weakness Extremities.  No edema Skin: clear   Mental state: Alert, oriented, cooperative    Lab Results: BMET    Component Value Date/Time   NA 141 08/31/2018 0503   NA 137 06/07/2018 0935   K 4.0 08/31/2018 0503   CL 107 08/31/2018 0503   CO2 25 08/31/2018 0503   GLUCOSE 185 (H) 08/31/2018 0503   BUN 20 08/31/2018 0503   BUN 13 06/07/2018 0935   CREATININE 0.99 08/31/2018 0503   CREATININE 0.78 10/16/2012 1545   CALCIUM 8.9 08/31/2018 0503   GFRNONAA >60 08/31/2018 0503   GFRNONAA >89 10/16/2012 1545   GFRAA >60 08/31/2018 0503   GFRAA >89 10/16/2012 1545   CBC    Component Value Date/Time   WBC 8.0 08/31/2018 0503   RBC 3.75 (L) 08/31/2018 0503   HGB 9.9 (L) 08/31/2018 0503   HGB 14.8 06/07/2018 0935   HCT 31.4 (L) 08/31/2018 0503   HCT 44.7 06/07/2018 0935   PLT 403 (H) 08/31/2018 0503   PLT 280 06/07/2018 0935   MCV 83.7 08/31/2018 0503   MCV 82 06/07/2018 0935   MCH 26.4 08/31/2018 0503   MCHC 31.5 08/31/2018 0503   RDW 15.1 08/31/2018 0503   RDW  12.8 06/07/2018 0935   LYMPHSABS 1.4 08/31/2018 0503   LYMPHSABS 1.6 06/07/2018 0935   MONOABS 0.7 08/31/2018 0503   EOSABS 0.4 08/31/2018 0503   EOSABS 0.1 06/07/2018 0935   BASOSABS 0.1 08/31/2018 0503   BASOSABS 0.1 06/07/2018 0935     Medications: I have reviewed the patient's current medications.  Assessment/Plan:   Functional deficits secondary to general debility and encephalopathy.  Continue CIR Status post cardiac arrest.  Status post stenting DVT prophylaxis.  Continue Eliquis as well as DAPT History of acute diastolic heart failure.  Compensated History of PAF continue amiodarone and Eliquis Hypertension.  BP well controlled Diabetes mellitus.  Fasting blood sugar 157 and seems to be trending down.  We will continue present regimen   Length of stay, days: 3  Marletta Lor , MD 09/02/2018, 11:03  AM

## 2018-09-03 LAB — GLUCOSE, CAPILLARY
Glucose-Capillary: 93 mg/dL (ref 70–99)
Glucose-Capillary: 160 mg/dL — ABNORMAL HIGH (ref 70–99)
Glucose-Capillary: 120 mg/dL — ABNORMAL HIGH (ref 70–99)
Glucose-Capillary: 273 mg/dL — ABNORMAL HIGH (ref 70–99)

## 2018-09-03 NOTE — Progress Notes (Signed)
[name] [name] is a 78 y.o. male admitted for CIR with debility following cardiac arrest associated with encephalopathy.  Past Medical History:  Diagnosis Date  . Basal cell carcinoma    left forehead, nose, neck  . Cataract   . Diabetes mellitus   . Diabetes mellitus without complication ([place])   . Diverticulitis   . Erectile dysfunction   . Gastroesophageal reflux disease with hiatal hernia   . Goiter, nontoxic, multinodular    with macrocyst  . Hiatal hernia    With reflex  . Hyperlipidemia   . Inguinal hernia    right side  . Kidney stone 1980  . Leg fracture    Left  . Liver hemangioma   . Nephrolithiasis   . Vitamin D deficiency      Subjective: No new complaints. No new problems. Slept well.  Episode of dyspepsia through the night relieved by Tums  Objective: Vital signs in last 24 hours: Temp:  [97.3 F (36.3 C)-97.6 F (36.4 C)] 97.6 F (36.4 C) (05/17 0542) Pulse Rate:  [57-64] 61 (05/17 0542) Resp:  [16] 16 (05/17 0542) BP: (112-130)/(53-69) 118/63 (05/17 0542) SpO2:  [98 %-100 %] 98 % (05/17 0542) Weight change:  Last BM Date: 09/01/18  Intake/Output from previous day: 05/16 0701 - 05/17 0700 In: 360 [P.O.:360] Out: 500 [Urine:500] Last cbgs: CBG (last 3)  Recent Labs    09/02/18 1710 09/02/18 2109 09/03/18 0636  GLUCAP 242* 164* 93   Patient Vitals for the past 24 hrs:  BP Temp Temp src Pulse Resp SpO2  09/03/18 0542 118/63 97.6 F (36.4 C) Oral 61 16 98 %  09/02/18 2114 - - - 64 - -  09/02/18 1952 130/69 (!) 97.3 F (36.3 C) Oral (!) 57 16 99 %  09/02/18 1336 (!) 112/53 (!) 97.5 F (36.4 C) Oral 63 - 100 %     Physical Exam General: No apparent distress   HEENT: not dry Lungs: Normal effort. Lungs clear to auscultation, no crackles or wheezes. Cardiovascular: Regular rate and rhythm, no edema Abdomen: S/NT/ND; BS(+) Musculoskeletal:  unchanged Neurological: No new neurological deficits with mild weakness Extremities.  No  edema Skin: clear   Mental state: Alert, oriented, cooperative    Lab Results: BMET    Component Value Date/Time   NA 141 08/31/2018 0503   NA 137 06/07/2018 0935   K 4.0 08/31/2018 0503   CL 107 08/31/2018 0503   CO2 25 08/31/2018 0503   GLUCOSE 185 (H) 08/31/2018 0503   BUN 20 08/31/2018 0503   BUN 13 06/07/2018 0935   CREATININE 0.99 08/31/2018 0503   CREATININE 0.78 10/16/2012 1545   CALCIUM 8.9 08/31/2018 0503   GFRNONAA >60 08/31/2018 0503   GFRNONAA >89 10/16/2012 1545   GFRAA >60 08/31/2018 0503   GFRAA >89 10/16/2012 1545   CBC    Component Value Date/Time   WBC 8.0 08/31/2018 0503   RBC 3.75 (L) 08/31/2018 0503   HGB 9.9 (L) 08/31/2018 0503   HGB 14.8 06/07/2018 0935   HCT 31.4 (L) 08/31/2018 0503   HCT 44.7 06/07/2018 0935   PLT 403 (H) 08/31/2018 0503   PLT 280 06/07/2018 0935   MCV 83.7 08/31/2018 0503   MCV 82 06/07/2018 0935   MCH 26.4 08/31/2018 0503   MCHC 31.5 08/31/2018 0503   RDW 15.1 08/31/2018 0503   RDW 12.8 06/07/2018 0935   LYMPHSABS 1.4 08/31/2018 0503   LYMPHSABS 1.6 06/07/2018 0935   MONOABS 0.7 08/31/2018 0503  EOSABS 0.4 08/31/2018 0503   EOSABS 0.1 06/07/2018 0935   BASOSABS 0.1 08/31/2018 0503   BASOSABS 0.1 06/07/2018 0935     Medications: I have reviewed the patient's current medications.  Assessment/Plan:  Functional deficits with general debility secondary to cardiac arrest with encephalopathy.  Continue CIR Coronary artery disease.  Status post stenting.  Continue DAPT DVT prophylaxis.  Continue Eliquis History of PAF.  Continue amiodarone and Eliquis Hypertension.  Remains well controlled Diabetes mellitus.  Fasting blood sugar 93 today.  No change in regimen   Length of stay, days: 4  Marletta Lor , MD 09/03/2018, 10:47 AM

## 2018-09-04 ENCOUNTER — Inpatient Hospital Stay (HOSPITAL_COMMUNITY): Payer: Medicare PPO

## 2018-09-04 ENCOUNTER — Inpatient Hospital Stay (HOSPITAL_COMMUNITY): Payer: Medicare PPO | Admitting: Physical Therapy

## 2018-09-04 ENCOUNTER — Inpatient Hospital Stay (HOSPITAL_COMMUNITY): Payer: Medicare PPO | Admitting: Occupational Therapy

## 2018-09-04 LAB — GLUCOSE, CAPILLARY
Glucose-Capillary: 111 mg/dL — ABNORMAL HIGH (ref 70–99)
Glucose-Capillary: 179 mg/dL — ABNORMAL HIGH (ref 70–99)
Glucose-Capillary: 179 mg/dL — ABNORMAL HIGH (ref 70–99)
Glucose-Capillary: 260 mg/dL — ABNORMAL HIGH (ref 70–99)

## 2018-09-04 NOTE — Progress Notes (Signed)
Speech Language Pathology Daily Session Note  Patient Details  Name: [name] [name] MRN: [id number] Date of Birth: 1940/06/02  Today's Date: 09/04/2018 SLP Individual Time: 1302-1400 SLP Individual Time Calculation (min): 58 min  Short Term Goals: Week 1: SLP Short Term Goal 1 (Week 1): Pt will tolerate dys2 diet and thin liquids without overt s/s aspiration or decline in respiratory status. SLP Short Term Goal 2 (Week 1): Pt will demonstrate ability to complete basic reasoning, problem solving, and thought organization tasks with Mod A verbal cues SLP Short Term Goal 2 - Progress (Week 1): Progressing toward goal SLP Short Term Goal 3 (Week 1): Pt will recall daily and functional information with Min A verbal and visual cues SLP Short Term Goal 3 - Progress (Week 1): Progressing toward goal SLP Short Term Goal 4 (Week 1): Pt will complete semi-complex functional tasks regarding money and medication management with Mod A verbal and visual cues SLP Short Term Goal 4 - Progress (Week 1): Progressing toward goal  Skilled Therapeutic Interventions:Skilled ST services focused on swallow and cognitive skills. SLP facilitated PO consumption trial of dys 3 and regular textured snack with dentures in place, pt demonstrated appropriate oral clearance and no overt s/s aspiration. SLP scheduled regular trial tray for tomorrow's SLP session, to further asses advanced solids in large quantities. SLP facilitated recall of medication , given list, pt required supervision A verbal cues. Pt required re-education of how to adjust medication, communication/clearance from doctor prior to any changes. Pt agreed, however will required continued education. SLP facilitated semi-complex problem solving and though organization in money management task (account balance and verbal word problems) pt required supervision A verbal cues. Pt was left in room with call bell within reach and chair alarm set. ST recommends to continue  skilled ST services.      Pain Pain Assessment Pain Scale: Faces Pain Score: 0-No pain  Therapy/Group: Individual Therapy  Aleigh Grunden  [facility] 09/04/2018, 2:19 PM

## 2018-09-04 NOTE — Progress Notes (Addendum)
Physical Therapy Session Note  Patient Details  Name: [name] [name] MRN: [id number] Date of Birth: [birth year]  Today's Date: 09/04/2018 PT Individual Time: 0807-0901 PT Individual Time Calculation (min): 54 min   Short Term Goals: Week 1:  PT Short Term Goal 1 (Week 1): pt will move supine>< sit with supervision PT Short Term Goal 2 (Week 1): pt will transfer wiht mod assist of 1 person PT Short Term Goal 3 (Week 1): pt will propel wc x 50' with min assist PT Short Term Goal 4 (Week 1): pt will stand with bil UE support x 3 minutes and mod assist, during functional activity PT Short Term Goal 5 (Week 1): pt will initiate stairs  Skilled Therapeutic Interventions/Progress Updates:  Pt received in bed & agreeable to tx. Pt with poor awareness overall, attempting to transfer to sitting EOB before therapist moved bed rail out of the way and reports he should just go home because he isn't getting any better, he can walk on the treadmill at home with therapist attempting to educate him that it's unsafe to use treadmill at this time. Pt requests to get dressed and requires up to max assist to recognize posterior/R LOB when sitting EOB donning shirt, assistance to thread pants BLE to prevent LOB and mod assist for standing balance while pt pulls pants over hips. Pt transfers bed>w/c on R with min assist and don shoes from w/c level with supervision. Pt set himself up at sink and performs grooming tasks (combing hair, inserting dentures) with supervision. Transported pt to gym via w/c dependent assist for time management. Pt transfers sit>stand with min assist and ambulates 50 ft x 2 without AD & mod assist with scissoring gait, decreased weight shifting L, decreased foot clearance RLE and significantly impaired balance and attention to task. Pt with very slight improvement in gait pattern & sustained attention with cuing but still requires mod assist for balance. Pt performs standing taps to colored targets  on floor to focus on BLE coordination, weight shifting, dynamic balance with pt requiring mod assist. Pt performed 2 sets of 10x sit<>stand without BUE & min assist with task focusing on standing balance/correcting posterior lean after sit>stand & BLE strengthening.  Pt utilized nu-step on level 4 x 10 minutes with all four extremities with task focusing on global strengthening & coordination of reciprocal movements. At end of session pt left sitting in w/c with chair pad alarm donned & call bell in reach.  Pt with poor intellectual awareness & perseverative on BLE being weak.   Addendum: no c/o pain reported.  Therapy Documentation Precautions:  Precautions Precautions: Fall Restrictions Weight Bearing Restrictions: No    Therapy/Group: Individual Therapy  Waunita Schooner 09/04/2018, 9:05 AM

## 2018-09-04 NOTE — Progress Notes (Signed)
Occupational Therapy Session Note  Patient Details  Name: [name] [name] MRN: [id number] Date of Birth: 1941-01-11  Today's Date: 09/04/2018 OT Individual Time: 3646-8032 OT Individual Time Calculation (min): 55 min    Short Term Goals: Week 1:  OT Short Term Goal 1 (Week 1): Pt will complete toilet transfer with mod A OT Short Term Goal 2 (Week 1): Pt will sit EOB for 5 mins with no more than min A for balance OT Short Term Goal 3 (Week 1): Pt will complete 1 step of LB dressing task OT Short Term Goal 4 (Week 1): Pt will complete sit<>stand with mod A in preparation for BADL tasks  Skilled Therapeutic Interventions/Progress Updates:    Pt completed functional mobility down to the dayroom with use of the RW and mod assist.  He was able to work on standing balance, static and dynamic while engaged in therapeutic activity using the corn hole game.  He demonstrated LOB posteriorly frequently in standing when reaching for bean bags and after tossing them.  He progressed and improved throughout session with pt demonstrating ability to go pick up the bean bags with min assist using the RW.  Still with LOB when turning as well as needing mod instructional cueing for hand placement during sit to stand.  Max instructional cueing to stay closer to the walker with mobility as well as upright standing posture.  Finished session with return to the room and pt left resting in the bedside recliner with call button and phone in reach and chair alarm in place.    Therapy Documentation Precautions:  Precautions Precautions: Fall Restrictions Weight Bearing Restrictions: No   Pain: Pain Assessment Pain Scale: Faces Pain Score: 0-No pain Faces Pain Scale: No hurt ADL: See Care Tool Section for some details of ADL  Therapy/Group: Individual Therapy  Aster Screws OTR/L 09/04/2018, 12:35 PM

## 2018-09-04 NOTE — Progress Notes (Signed)
[place] PHYSICAL MEDICINE & REHABILITATION PROGRESS NOTE   Subjective/Complaints: Pt without SOB or CP  View of systems negative for chest pain shortness of breath nausea vomiting diarrhea or constipation   Objective:   No results found. No results for input(s): WBC, HGB, HCT, PLT in the last 72 hours. No results for input(s): NA, K, CL, CO2, GLUCOSE, BUN, CREATININE, CALCIUM in the last 72 hours.  Intake/Output Summary (Last 24 hours) at 09/04/2018 0952 Last data filed at 09/04/2018 0739 Gross per 24 hour  Intake 926 ml  Output 425 ml  Net 501 ml     Physical Exam: Vital Signs Blood pressure 121/66, pulse 62, temperature 97.8 F (36.6 C), temperature source Oral, resp. rate 16, height 5\' 10"  (1.778 m), weight 69.7 kg, SpO2 97 %.   General: No acute distress, edentulous Mood and affect are appropriate Heart: Regular rate and rhythm no rubs murmurs or extra sounds Lungs: Clear to auscultation, breathing unlabored, no rales or wheezes Abdomen: Positive bowel sounds, soft nontender to palpation, nondistended Extremities: No clubbing, cyanosis, or edema Skin: No evidence of breakdown, no evidence of rash Neurologic: Cranial nerves II through XII intact, motor strength is 4/5 in bilateral deltoid, bicep, tricep, grip, hip flexor, knee extensors, ankle dorsiflexor and plantar flexor Sensory exam normal sensation to light touch and proprioception in bilateral upper and lower extremities Cerebellar exam normal finger to nose to finger as well as heel to shin in bilateral upper and lower extremities Unchanged Musculoskeletal: Full range of motion in all 4 extremities. No joint swelling Oriented x3 Dysarthria  Assessment/Plan: 1. Functional deficits secondary to Debility and encephalopathy which require 3+ hours per day of interdisciplinary therapy in a comprehensive inpatient rehab setting.  Physiatrist is providing close team supervision and 24 hour management of active  medical problems listed below.  Physiatrist and rehab team continue to assess barriers to discharge/monitor patient progress toward functional and medical goals  Care Tool:  Bathing    Body parts bathed by patient: Right arm, Left arm, Chest, Abdomen, Front perineal area, Right upper leg, Left upper leg, Face   Body parts bathed by helper: Buttocks, Right lower leg, Left lower leg     Bathing assist Assist Level: Moderate Assistance - Patient 50 - 74%(tends to right laterally lean and is fast moving and impulsive)     Upper Body Dressing/Undressing Upper body dressing   What is the patient wearing?: Pull over shirt    Upper body assist Assist Level: Set up assist    Lower Body Dressing/Undressing Lower body dressing      What is the patient wearing?: Pants, Underwear/pull up     Lower body assist Assist for lower body dressing: Moderate Assistance - Patient 50 - 74%(due to right leaning upon standing over moving dynamically and is impulsive fast mover)     Toileting Toileting    Toileting assist Assist for toileting: Moderate Assistance - Patient 50 - 74% Assistive Device Comment: (Used urinal standing with 2 helpers)   Transfers Chair/bed transfer  Transfers assist     Chair/bed transfer assist level: Minimal Assistance - Patient > 75%     Locomotion Ambulation   Ambulation assist      Assist level: Moderate Assistance - Patient 50 - 74% Assistive device: No Device Max distance: 50 ft    Walk 10 feet activity   Assist  Walk 10 feet activity did not occur: Safety/medical concerns(balance deficits)  Assist level: Moderate Assistance - Patient - 50 - 74%  Assistive device: No Device   Walk 50 feet activity   Assist Walk 50 feet with 2 turns activity did not occur: Safety/medical concerns(balance deficits)  Assist level: Moderate Assistance - Patient - 50 - 74% Assistive device: No Device    Walk 150 feet activity   Assist Walk 150 feet  activity did not occur: Safety/medical concerns(balance deficits)         Walk 10 feet on uneven surface  activity   Assist Walk 10 feet on uneven surfaces activity did not occur: Safety/medical concerns(balance deficits)         Wheelchair     Assist Will patient use wheelchair at discharge?: Yes Type of Wheelchair: Manual    Wheelchair assist level: Moderate Assistance - Patient 50 - 74% Max wheelchair distance: 25    Wheelchair 50 feet with 2 turns activity    Assist    Wheelchair 50 feet with 2 turns activity did not occur: Safety/medical concerns(fatigue)       Wheelchair 150 feet activity     Assist Wheelchair 150 feet activity did not occur: Safety/medical concerns(fatigue)        Medical Problem List and Plan: 1.  Debility and acute encephalopathy secondary to cardiac arrest status post stenting/multi-medical             CIR  PT OT speech- dysarthria may be due to lack of dentures 2.  Antithrombotics: -DVT/anticoagulation: Eliquis             -antiplatelet therapy: Aspirin 81 mg daily, Plavix 75 mg daily.  Continue aspirin for 1 month and then Plavix for at least 6 months. 3. Pain Management: Tylenol as needed 4. Mood: Provide emotional support             -antipsychotic agents: Seroquel 25 mg nightly 5. Neuropsych: This patient is not capable of making decisions on his own behalf. 6. Skin/Wound Care: Routine skin checks 7. Fluids/Electrolytes/Nutrition: Routine in and outs.  Follow-up BMP tomorrow a.m. 8.  Acute diastolic congestive heart failure.  Monitor for signs of fluid overload 9.  PAF with RVR.  Continue Eliquis.  Amiodarone adjusted as per cardiology services.  Transition to amiodarone 200 mg daily 5/14 HR ok 10.  Hypertension.  Norvasc 5 mg daily, Avapro 75 mg daily, Lopressor 50 mg twice daily.  Monitor with increased mobility Vitals:   09/04/18 0518 09/04/18 0519  BP: 121/66   Pulse: (!) 58 62  Resp: 16   Temp: 97.8 F (36.6  C)   SpO2: 97%    11.  Diabetes mellitus.  Hemoglobin A1c 8.7.  Levemir 20 units twice daily.  Check blood sugars before meals and at bedtime.  Monitor with increased mobility. CBG (last 3)  Recent Labs    09/03/18 1718 09/03/18 2057 09/04/18 0621  GLUCAP 120* 273* 111*  some lability , monitor 12.  Dysphagia.  Dysphagia #2 nectar liquids.  Follow-up speech therapy.  Advance as tolerated. 13.  Aspiration pneumonia.  IV cefepime completed 08/29/2018.  Chest x-ray on 08/25/2018 reviewed showing bilateral opacities.repeat chest x-ray demonstrates atelectasis, patient afebrile with normal white count will monitor. 14.  Hyperlipidemia.  Lipitor 15.  Hypothyroidism.  Synthroid  LOS: 5 days A FACE TO [place] E Marcellas Marchant 09/04/2018, 9:52 AM

## 2018-09-04 NOTE — Plan of Care (Signed)
  Problem: Consults Goal: Skin Care Protocol Initiated - if Braden Score 18 or less Description If consults are not indicated, leave blank or document N/A Outcome: Progressing Goal: Diabetes Guidelines if Diabetic/Glucose > 140 Description If diabetic or lab glucose is > 140 mg/dl - Initiate Diabetes/Hyperglycemia Guidelines & Document Interventions  Outcome: Progressing Goal: RH GENERAL PATIENT EDUCATION Description See Patient Education module for education specifics. Outcome: Progressing   Problem: RH BOWEL ELIMINATION Goal: RH STG MANAGE BOWEL WITH ASSISTANCE Description STG Manage Bowel with mod I Assistance.  Outcome: Progressing Goal: RH STG MANAGE BOWEL W/MEDICATION W/ASSISTANCE Description STG Manage Bowel with Medication with mod I Assistance.  Outcome: Progressing   Problem: RH BLADDER ELIMINATION Goal: RH STG MANAGE BLADDER WITH ASSISTANCE Description STG Manage Bladder With mod I Assistance  Outcome: Progressing   Problem: RH SAFETY Goal: RH STG ADHERE TO SAFETY PRECAUTIONS W/ASSISTANCE/DEVICE Description STG Adhere to Safety Precautions With supervision/cues Assistance/Device. Outcome: Progressing   Problem: RH PAIN MANAGEMENT Goal: RH STG PAIN MANAGED AT OR BELOW PT'S PAIN GOAL Description Pain scale <2/10  Outcome: Progressing   Problem: RH KNOWLEDGE DEFICIT GENERAL Goal: RH STG INCREASE KNOWLEDGE OF SELF CARE AFTER HOSPITALIZATION Description Pt will be able to direct care at discharge including management of HTN, HLD and DM with cues/supervision of use of educational resources provided  Outcome: Progressing   Problem: Consults Goal: RH STROKE PATIENT EDUCATION Description See Patient Education module for education specifics  Outcome: Progressing

## 2018-09-04 NOTE — Progress Notes (Signed)
Assisted to [facility], LOB to right and posteriorly. Patient assuring RN, he was fine and can ambulate without assistance. Didn't lock WC when transferring back to bed. Patrici Ranks A

## 2018-09-04 NOTE — Progress Notes (Signed)
Physical Therapy Session Note  Patient Details  Name: [name] [name] MRN: [id number] Date of Birth: 08-27-1940  Today's Date: 09/04/2018 PT Individual Time: 8299-3716 PT Individual Time Calculation (min): 40 min   Short Term Goals: Week 1:  PT Short Term Goal 1 (Week 1): pt will move supine>< sit with supervision PT Short Term Goal 2 (Week 1): pt will transfer wiht mod assist of 1 person PT Short Term Goal 3 (Week 1): pt will propel wc x 50' with min assist PT Short Term Goal 4 (Week 1): pt will stand with bil UE support x 3 minutes and mod assist, during functional activity PT Short Term Goal 5 (Week 1): pt will initiate stairs  Skilled Therapeutic Interventions/Progress Updates:    Pt seen for make-up time this session. Pt supine in bed upon PT arrival, agreeable to therapy tx and denies pain. Pt transferred to sitting EOB with supervision, therapist donned shoes for time management. Pt performed stand pivot to w/c with min assist and transported to the gym. Pt ambulated 2 x 85 ft this session with RW and min assist, cues for upright posture. Pt worked on standing balance and heel cord stretching to perform horseshoe toss activity while standing on red wedge without UE support, cues to correct posterior lean. Pt seated in w/c performed hamstring stretching with LE propped up on bench, 2 x 30 sec each LE. Pt performed x 10 sit<>stands from the mat without UE support working on LE strengthening, CGA and cues for anterior weightshift. Pt worked on dynamic standing balance without UE support to perform toe taps on 6 inch step, x 2 trials with CGA-mod assist to correct LOB. Pt performed sidestepping in each direction working on hip strength and balance, mod assist without UE support. Pt transported back to room and transferred to bed, left supine with needs in reach and chair alarm set.   Therapy Documentation Precautions:  Precautions Precautions: Fall Restrictions Weight Bearing Restrictions:  No    Therapy/Group: Individual Therapy  Netta Corrigan, PT, DPT 09/04/2018, 3:33 PM

## 2018-09-05 ENCOUNTER — Inpatient Hospital Stay (HOSPITAL_COMMUNITY): Payer: Medicare PPO | Admitting: Physical Therapy

## 2018-09-05 ENCOUNTER — Inpatient Hospital Stay (HOSPITAL_COMMUNITY): Payer: Medicare PPO | Admitting: Occupational Therapy

## 2018-09-05 ENCOUNTER — Inpatient Hospital Stay (HOSPITAL_COMMUNITY): Payer: Medicare PPO

## 2018-09-05 LAB — GLUCOSE, CAPILLARY
Glucose-Capillary: 110 mg/dL — ABNORMAL HIGH (ref 70–99)
Glucose-Capillary: 151 mg/dL — ABNORMAL HIGH (ref 70–99)
Glucose-Capillary: 270 mg/dL — ABNORMAL HIGH (ref 70–99)
Glucose-Capillary: 242 mg/dL — ABNORMAL HIGH (ref 70–99)

## 2018-09-05 NOTE — Progress Notes (Signed)
[place] PHYSICAL MEDICINE & REHABILITATION PROGRESS NOTE   Subjective/Complaints: Reviewed physical therapy notes, improving to a min assist level with mobility Occasional cough, denies any bladder issue  View of systems negative for chest pain shortness of breath nausea vomiting diarrhea or constipation   Objective:   No results found. No results for input(s): WBC, HGB, HCT, PLT in the last 72 hours. No results for input(s): NA, K, CL, CO2, GLUCOSE, BUN, CREATININE, CALCIUM in the last 72 hours.  Intake/Output Summary (Last 24 hours) at 09/05/2018 0800 Last data filed at 09/05/2018 0759 Gross per 24 hour  Intake 954 ml  Output 950 ml  Net 4 ml     Physical Exam: Vital Signs Blood pressure 138/63, pulse 67, temperature 97.7 F (36.5 C), temperature source Oral, resp. rate 18, height 5\' 10"  (1.778 m), weight 69.7 kg, SpO2 100 %.   General: No acute distress, edentulous Mood and affect are appropriate Heart: Regular rate and rhythm no rubs murmurs or extra sounds Lungs: Clear to auscultation, breathing unlabored, no rales or wheezes Abdomen: Positive bowel sounds, soft nontender to palpation, nondistended Extremities: No clubbing, cyanosis, or edema Skin: No evidence of breakdown, no evidence of rash Neurologic: Cranial nerves II through XII intact, motor strength is 4/5 in bilateral deltoid, bicep, tricep, grip, hip flexor, knee extensors, ankle dorsiflexor and plantar flexor Sensory exam normal sensation to light touch and proprioception in bilateral upper and lower extremities Cerebellar exam normal finger to nose to finger as well as heel to shin in bilateral upper and lower extremities Unchanged Musculoskeletal: Full range of motion in all 4 extremities. No joint swelling Oriented x3 Dysarthria  Assessment/Plan: 1. Functional deficits secondary to Debility and encephalopathy which require 3+ hours per day of interdisciplinary therapy in a comprehensive inpatient  rehab setting.  Physiatrist is providing close team supervision and 24 hour management of active medical problems listed below.  Physiatrist and rehab team continue to assess barriers to discharge/monitor patient progress toward functional and medical goals  Care Tool:  Bathing    Body parts bathed by patient: Right arm, Left arm, Chest, Abdomen, Front perineal area, Right upper leg, Left upper leg, Face   Body parts bathed by helper: Buttocks, Right lower leg, Left lower leg     Bathing assist Assist Level: Moderate Assistance - Patient 50 - 74%(tends to right laterally lean and is fast moving and impulsive)     Upper Body Dressing/Undressing Upper body dressing   What is the patient wearing?: Pull over shirt    Upper body assist Assist Level: Set up assist    Lower Body Dressing/Undressing Lower body dressing      What is the patient wearing?: Pants, Underwear/pull up     Lower body assist Assist for lower body dressing: Moderate Assistance - Patient 50 - 74%(due to right leaning upon standing over moving dynamically and is impulsive fast mover)     Toileting Toileting    Toileting assist Assist for toileting: Moderate Assistance - Patient 50 - 74% Assistive Device Comment: (Used urinal standing with 2 helpers)   Transfers Chair/bed transfer  Transfers assist     Chair/bed transfer assist level: Minimal Assistance - Patient > 75%     Locomotion Ambulation   Ambulation assist      Assist level: Minimal Assistance - Patient > 75% Assistive device: Walker-rolling Max distance: 85 ft   Walk 10 feet activity   Assist  Walk 10 feet activity did not occur: Safety/medical concerns(balance deficits)  Assist level: Minimal Assistance - Patient > 75% Assistive device: Walker-rolling   Walk 50 feet activity   Assist Walk 50 feet with 2 turns activity did not occur: Safety/medical concerns(balance deficits)  Assist level: Minimal Assistance - Patient >  75% Assistive device: Walker-rolling    Walk 150 feet activity   Assist Walk 150 feet activity did not occur: Safety/medical concerns(balance deficits)         Walk 10 feet on uneven surface  activity   Assist Walk 10 feet on uneven surfaces activity did not occur: Safety/medical concerns(balance deficits)         Wheelchair     Assist Will patient use wheelchair at discharge?: Yes Type of Wheelchair: Manual    Wheelchair assist level: Moderate Assistance - Patient 50 - 74% Max wheelchair distance: 25    Wheelchair 50 feet with 2 turns activity    Assist    Wheelchair 50 feet with 2 turns activity did not occur: Safety/medical concerns(fatigue)       Wheelchair 150 feet activity     Assist Wheelchair 150 feet activity did not occur: Safety/medical concerns(fatigue)        Medical Problem List and Plan: 1.  Debility and acute encephalopathy secondary to cardiac arrest status post stenting/multi-medical             CIR  PT OT speech-team conference in a.m, . 2.  Antithrombotics: -DVT/anticoagulation: Eliquis             -antiplatelet therapy: Aspirin 81 mg daily, Plavix 75 mg daily.  Continue aspirin for 1 month and then Plavix for at least 6 months. 3. Pain Management: Tylenol as needed 4. Mood: Provide emotional support             -antipsychotic agents: Seroquel 25 mg nightly 5. Neuropsych: This patient is not capable of making decisions on his own behalf. 6. Skin/Wound Care: Routine skin checks 7. Fluids/Electrolytes/Nutrition: Routine in and outs.  Follow-up BMP tomorrow a.m. 8.  Acute diastolic congestive heart failure.  Monitor for signs of fluid overload 9.  PAF with RVR.  Continue Eliquis.  Amiodarone adjusted as per cardiology services.  Transition to amiodarone 200 mg daily 5/14 HR ok 10.  Hypertension.  Norvasc 5 mg daily, Avapro 75 mg daily, Lopressor 50 mg twice daily.  Monitor with increased mobility Vitals:   09/04/18 1951  09/05/18 0606  BP: 120/64 138/63  Pulse: 67 67  Resp: 17 18  Temp: 98.1 F (36.7 C) 97.7 F (36.5 C)  SpO2: 99% 100%  Controlled on current medications 11.  Diabetes mellitus.  Hemoglobin A1c 8.7.  Levemir 20 units twice daily.  Check blood sugars before meals and at bedtime.  Monitor with increased mobility. CBG (last 3)  Recent Labs    09/04/18 1642 09/04/18 2103 09/05/18 0632  GLUCAP 179* 260* 110*  some lability , monitor 12.  Dysphagia.  Dysphagia #2 nectar liquids.  Follow-up speech therapy.  Advance as tolerated. 13.  Aspiration pneumonia.  IV cefepime completed 08/29/2018.  Chest x-ray on 08/25/2018 reviewed showing bilateral opacities.repeat chest x-ray demonstrates atelectasis, patient afebrile with normal white count will monitor. 14.  Hyperlipidemia.  Lipitor 15.  Hypothyroidism.  Synthroid  LOS: 6 days A FACE TO [place] 09/05/2018, 8:00 AM

## 2018-09-05 NOTE — Progress Notes (Signed)
Occupational Therapy Session Note  Patient Details  Name: [name] [name] MRN: [id number] Date of Birth: [birth year]  Today's Date: 09/05/2018 OT Individual Time: 5732-2025 OT Individual Time Calculation (min): 54 min    Short Term Goals: Week 1:  OT Short Term Goal 1 (Week 1): Pt will complete toilet transfer with mod A OT Short Term Goal 2 (Week 1): Pt will sit EOB for 5 mins with no more than min A for balance OT Short Term Goal 3 (Week 1): Pt will complete 1 step of LB dressing task OT Short Term Goal 4 (Week 1): Pt will complete sit<>stand with mod A in preparation for BADL tasks  Skilled Therapeutic Interventions/Progress Updates:    Pt completed functional mobility to the therapy dayroom with min assist using the RW for support.  Increased cervical and trunk flexion was noted during mobility, requiring mod demonstrational cueing to correct.  He worked on Therapist, occupational during session.  He demonstrates decreased balance reactions with LOB posteriorly in standing.  Had him stand on foam surface with min assist for stepping on and off.  He was able to maintain static standing with min guard assist on the foam.  Progressed to having him ambulate around and pick up cones placed on the floor.  Min assist to complete 6 cones X2 with LOB noted on 50% or greater.  He tends to try and move too quick after picking up the cone, without standing all the way back up and getting his balance.  Next had him ambulate to the therapy gym where he used the [employer] and Limits of Stability program.  He completed 4-5 sets with average score of 25% or slightly lower.  Finished session with return to the room using the RW and min assist.  He washed his hands at the sink with min guard assist and then sat in the wheelchair with call button and phone in reach and safety belt in place.   Therapy Documentation Precautions:  Precautions Precautions: Fall Restrictions Weight Bearing Restrictions: No   Pain: Pain Assessment Pain Scale: Faces Pain Score: 0-No pain ADL: See Care Tool Section for some details of ADL  Therapy/Group: Individual Therapy  Chantz Montefusco OTR/L 09/05/2018, 12:23 PM

## 2018-09-05 NOTE — Progress Notes (Signed)
Speech Language Pathology Daily Session Note  Patient Details  Name: [name] [name] MRN: [id number] Date of Birth: 1940/11/04  Today's Date: 09/05/2018 SLP Individual Time: 1321-1410 SLP Individual Time Calculation (min): 49 min  Short Term Goals: Week 1: SLP Short Term Goal 1 (Week 1): Pt will tolerate regular diet and thin liquids without overt s/s aspiration or decline in respiratory status and supervision A verbal cues for swallow strategies. SLP Short Term Goal 2 (Week 1): Pt will demonstrate ability to complete basic reasoning, problem solving, and thought organization tasks with Mod A verbal cues SLP Short Term Goal 2 - Progress (Week 1): Progressing toward goal SLP Short Term Goal 3 (Week 1): Pt will recall daily and functional information with Min A verbal and visual cues SLP Short Term Goal 3 - Progress (Week 1): Progressing toward goal SLP Short Term Goal 4 (Week 1): Pt will complete semi-complex functional tasks regarding money and medication management with Mod A verbal and visual cues SLP Short Term Goal 4 - Progress (Week 1): Progressing toward goal  Skilled Therapeutic Interventions: Skilled ST services focused on swallow skills. SLP missed 11 minutes of therapy time due retrieving lunch tray from kitchen, kitchen delivered dys 2 instead of regular textured trial tray. SLP facilitated PO consumption of regular and thin liquids via cup, pt required min A verbal cues for swallow strategies (limite distractions, cease verbalization and slow rate.) when tested pt demonstrated ability to communicate with kitchen staff while placing a diet order during mastication with no overt s/s aspiration, however demonstrated inability to follow safe swallow strategies without cueing. SLP recommends to continue full supervision A with regular and thin diet. SLP communicated with nurse, [place] about diet upgrade and swallow strategies . Pt was left in room with call bell within reach and chair alarm  set. ST recommends to continue skilled ST services.      Pain Pain Assessment Pain Scale: Faces Pain Score: 0-No pain  Therapy/Group: Individual Therapy  Shain Pauwels  [facility] 09/05/2018, 2:48 PM

## 2018-09-05 NOTE — Progress Notes (Signed)
Physical Therapy Session Note  Patient Details  Name: [name] [name] MRN: [id number] Date of Birth: [birth year]  Today's Date: 09/05/2018 PT Individual Time: 2694-8546 PT Individual Time Calculation (min): 73 min   Short Term Goals: Week 1:  PT Short Term Goal 1 (Week 1): pt will move supine>< sit with supervision PT Short Term Goal 2 (Week 1): pt will transfer wiht mod assist of 1 person PT Short Term Goal 3 (Week 1): pt will propel wc x 50' with min assist PT Short Term Goal 4 (Week 1): pt will stand with bil UE support x 3 minutes and mod assist, during functional activity PT Short Term Goal 5 (Week 1): pt will initiate stairs  Skilled Therapeutic Interventions/Progress Updates:   Pt received sitting in w/c and agreeable to therapy session. Transported to/from gym in w/c. Ambulated ~23ft using RW with min assist/CGA for steadying - continues to demonstrated narrow BOS with intermittent scissoring gait requiring cuing for wider BOS and increased B LE step length. Seated B LE hamstring stretch 2x72minute each - cuing for proper technique. Repeated sit<>stand without UE support 2x10 repetitions with close supervision - demonstrating improved balance without posterior LOB throughout.   Performed the following standing balance tasks: - 2 bouts of standing on red wedge with dual task of placing clothespin on line: initially required min/mod assist for balance due to posterior lean going from sit>standing on wedge but then able to perform 2 sets of clothespins with close supervision and no LOB - 3 bouts of alternate B LE foot taps on 4" step toward external target with close supervision progressed to dual task of counting by 2s with pt demonstrating R lateral LOB with fatigue requiring mod assist to prevent LOB; on 3rd trial able to perform task without fatigue nor LOB with close supervision  - 3 bouts of alternate B LE cone taps with mod assist initially progressed to close supervision with  intermittent mod assist due to L lateral or posterior LOB - 3 bouts forward step-up on 4" step, no UE support, with varying min-mod assist for balance due to posterior and L lateral LOB - 2 bouts lateral step up on 4" step, no UE support, with varying CGA to mod assist when lateral LOB occurs; requires cuing for increased R lateral step length when stepping down for improved balance - cross body reaching to place bean bags lateral and posterior on mat with close supervision for safety.  Ambulated ~53ft x 2, no AD, with mod assist for balance and x1 L lateral LOB requiring total assist to maintain upright. Sidestepping at hallway rail with B UE support x33ft and min/mod assist for balance - visual demonstrating and cuing for proper technique; however, pt attempted to cross feet demonstrating poor safety awareness. Squat pivot EOM<>w/c with close supervision for safety. Transported back to room in w/c and left sitting in w/c with needs in reach and chair alarm on.    Therapy Documentation Precautions:  Precautions Precautions: Fall Restrictions Weight Bearing Restrictions: No  Pain:   Denies pain during session.    Therapy/Group: Individual Therapy  Tawana Scale, PT, DPT 09/05/2018, 7:53 AM

## 2018-09-06 ENCOUNTER — Inpatient Hospital Stay (HOSPITAL_COMMUNITY): Payer: Medicare PPO | Admitting: Occupational Therapy

## 2018-09-06 ENCOUNTER — Inpatient Hospital Stay (HOSPITAL_COMMUNITY): Payer: Medicare PPO

## 2018-09-06 LAB — GLUCOSE, CAPILLARY
Glucose-Capillary: 101 mg/dL — ABNORMAL HIGH (ref 70–99)
Glucose-Capillary: 234 mg/dL — ABNORMAL HIGH (ref 70–99)
Glucose-Capillary: 180 mg/dL — ABNORMAL HIGH (ref 70–99)
Glucose-Capillary: 310 mg/dL — ABNORMAL HIGH (ref 70–99)

## 2018-09-06 MED ORDER — METFORMIN HCL 500 MG PO TABS
500.0000 mg | ORAL_TABLET | Freq: Every day | ORAL | Status: DC
  Administered 2018-09-07: 500 mg via ORAL
  Filled 2018-09-06: qty 1

## 2018-09-06 MED ORDER — AMIODARONE HCL 200 MG PO TABS
200.0000 mg | ORAL_TABLET | Freq: Every day | ORAL | Status: DC
  Administered 2018-09-07: 200 mg via ORAL
  Filled 2018-09-06: qty 1

## 2018-09-06 NOTE — Progress Notes (Signed)
Social Work Patient ID: [name] [name], male   DOB: March 07, 1941, 78 y.o.   MRN: [id number] Team reports pt want sot go home ASAP. Ned to get wife and son in here for education to see if wife can manage him at current level. Pt does have some safety issues. Have contacted son and scheduled for tomorrow at 37;00 for wife and he to come in for therapies.

## 2018-09-06 NOTE — Progress Notes (Signed)
Speech Language Pathology Daily Session Note  Patient Details  Name: [name] [name] MRN: [id number] Date of Birth: 09/13/1940  Today's Date: 09/06/2018 SLP Individual Time: 1240-1340 SLP Individual Time Calculation (min): 60 min  Short Term Goals: Week 1: SLP Short Term Goal 1 (Week 1): Pt will tolerate regular diet and thin liquids without overt s/s aspiration or decline in respiratory status and supervision A verbal cues for swallow strategies. SLP Short Term Goal 1 - Progress (Week 1): Updated due to goal met SLP Short Term Goal 2 (Week 1): Pt will demonstrate ability to complete basic reasoning, problem solving, and thought organization tasks with Mod A verbal cues SLP Short Term Goal 2 - Progress (Week 1): Progressing toward goal SLP Short Term Goal 3 (Week 1): Pt will recall daily and functional information with Min A verbal and visual cues SLP Short Term Goal 3 - Progress (Week 1): Progressing toward goal SLP Short Term Goal 4 (Week 1): Pt will complete semi-complex functional tasks regarding money and medication management with Mod A verbal and visual cues SLP Short Term Goal 4 - Progress (Week 1): Progressing toward goal  Skilled Therapeutic Interventions: Skilled ST services focused on swallow and cognitive skills. Pt required mod A verbal cues to recall swallow strategies. SLP facilitated PO consumption of regular and thin lunch tray, pt required mod A fade to min A verbal cues for swallow strategies, primarily cease verbalization during mastication and to slow rate of consumption. Pt demonstrated no overt s/s aspiration. SLP facilitated complex problem solving skills utilizing ALFA subsections cheek witting, pt required supervision A verbal cues to complete calculations without calculator and min A verbal cues medication management. Pt continues to support he will be fine at home with own medication, suggesting reduced safety awareness. Pt was left with call bell within reach and chair  alarm set. SLP recommends to continue skilled ST services.     Pain Pain Assessment Pain Score: 0-No pain  Therapy/Group: Individual Therapy  [name]  [facility] 09/06/2018, 1:54 PM

## 2018-09-06 NOTE — Plan of Care (Signed)
  Problem: Consults Goal: Skin Care Protocol Initiated - if Braden Score 18 or less Description If consults are not indicated, leave blank or document N/A Outcome: Progressing Goal: Diabetes Guidelines if Diabetic/Glucose > 140 Description If diabetic or lab glucose is > 140 mg/dl - Initiate Diabetes/Hyperglycemia Guidelines & Document Interventions  Outcome: Progressing Goal: RH GENERAL PATIENT EDUCATION Description See Patient Education module for education specifics. Outcome: Progressing   Problem: RH BOWEL ELIMINATION Goal: RH STG MANAGE BOWEL WITH ASSISTANCE Description STG Manage Bowel with mod I Assistance.  Outcome: Progressing Goal: RH STG MANAGE BOWEL W/MEDICATION W/ASSISTANCE Description STG Manage Bowel with Medication with mod I Assistance.  Outcome: Progressing   Problem: RH BLADDER ELIMINATION Goal: RH STG MANAGE BLADDER WITH ASSISTANCE Description STG Manage Bladder With mod I Assistance  Outcome: Progressing Flowsheets (Taken 09/06/2018 1445) STG: Pt will manage bladder with assistance: 3-Moderate assistance   Problem: RH SAFETY Goal: RH STG ADHERE TO SAFETY PRECAUTIONS W/ASSISTANCE/DEVICE Description STG Adhere to Safety Precautions With supervision/cues Assistance/Device. Outcome: Progressing   Problem: RH PAIN MANAGEMENT Goal: RH STG PAIN MANAGED AT OR BELOW PT'S PAIN GOAL Description Pain scale <2/10  Outcome: Progressing   Problem: RH KNOWLEDGE DEFICIT GENERAL Goal: RH STG INCREASE KNOWLEDGE OF SELF CARE AFTER HOSPITALIZATION Description Pt will be able to direct care at discharge including management of HTN, HLD and DM with cues/supervision of use of educational resources provided  Outcome: Progressing   Problem: Consults Goal: RH STROKE PATIENT EDUCATION Description See Patient Education module for education specifics  Outcome: Progressing

## 2018-09-06 NOTE — Progress Notes (Addendum)
[place] PHYSICAL MEDICINE & REHABILITATION PROGRESS NOTE   Subjective/Complaints: No breathing problems, no dizziness when up   View of systems negative for chest pain shortness of breath nausea vomiting diarrhea or constipation   Objective:   No results found. No results for input(s): WBC, HGB, HCT, PLT in the last 72 hours. No results for input(s): NA, K, CL, CO2, GLUCOSE, BUN, CREATININE, CALCIUM in the last 72 hours.  Intake/Output Summary (Last 24 hours) at 09/06/2018 1115 Last data filed at 09/06/2018 0900 Gross per 24 hour  Intake 1135 ml  Output 700 ml  Net 435 ml     Physical Exam: Vital Signs Blood pressure (!) 94/46, pulse 63, temperature 98 F (36.7 C), resp. rate 19, height '5\' 10"'  (1.778 m), weight 69.7 kg, SpO2 100 %.   General: No acute distress, edentulous Mood and affect are appropriate Heart: Regular rate and rhythm no rubs murmurs or extra sounds Lungs: Clear to auscultation, breathing unlabored, no rales or wheezes Abdomen: Positive bowel sounds, soft nontender to palpation, nondistended Extremities: No clubbing, cyanosis, or edema Skin: No evidence of breakdown, no evidence of rash Neurologic: Cranial nerves II through XII intact, motor strength is 4/5 in bilateral deltoid, bicep, tricep, grip, hip flexor, knee extensors, ankle dorsiflexor and plantar flexor Sensory exam normal sensation to light touch and proprioception in bilateral upper and lower extremities Cerebellar exam normal finger to nose to finger as well as heel to shin in bilateral upper and lower extremities Unchanged Musculoskeletal: Full range of motion in all 4 extremities. No joint swelling Oriented x3 Dysarthria  Assessment/Plan: 1. Functional deficits secondary to Debility and encephalopathy which require 3+ hours per day of interdisciplinary therapy in a comprehensive inpatient rehab setting.  Physiatrist is providing close team supervision and 24 hour management of active  medical problems listed below.  Physiatrist and rehab team continue to assess barriers to discharge/monitor patient progress toward functional and medical goals  Care Tool:  Bathing    Body parts bathed by patient: Right arm, Left arm, Chest, Abdomen, Front perineal area, Right upper leg, Left upper leg, Face   Body parts bathed by helper: Buttocks, Right lower leg, Left lower leg     Bathing assist Assist Level: Minimal Assistance - Patient > 75%     Upper Body Dressing/Undressing Upper body dressing   What is the patient wearing?: Pull over shirt    Upper body assist Assist Level: Set up assist    Lower Body Dressing/Undressing Lower body dressing      What is the patient wearing?: Pants     Lower body assist Assist for lower body dressing: Moderate Assistance - Patient 50 - 74%     Toileting Toileting    Toileting assist Assist for toileting: Moderate Assistance - Patient 50 - 74% Assistive Device Comment: (Used urinal standing with 2 helpers)   Transfers Chair/bed transfer  Transfers assist     Chair/bed transfer assist level: Contact Guard/Touching assist     Locomotion Ambulation   Ambulation assist      Assist level: Minimal Assistance - Patient > 75% Assistive device: Walker-rolling Max distance: 150 ft   Walk 10 feet activity   Assist  Walk 10 feet activity did not occur: Safety/medical concerns(balance deficits)  Assist level: Minimal Assistance - Patient > 75% Assistive device: Walker-rolling   Walk 50 feet activity   Assist Walk 50 feet with 2 turns activity did not occur: Safety/medical concerns(balance deficits)  Assist level: Minimal Assistance - Patient >  75% Assistive device: Walker-rolling    Walk 150 feet activity   Assist Walk 150 feet activity did not occur: Safety/medical concerns(balance deficits)  Assist level: Minimal Assistance - Patient > 75% Assistive device: Walker-rolling    Walk 10 feet on uneven  surface  activity   Assist Walk 10 feet on uneven surfaces activity did not occur: Safety/medical concerns(balance deficits)         Wheelchair     Assist Will patient use wheelchair at discharge?: Yes Type of Wheelchair: Manual    Wheelchair assist level: Moderate Assistance - Patient 50 - 74% Max wheelchair distance: 25    Wheelchair 50 feet with 2 turns activity    Assist    Wheelchair 50 feet with 2 turns activity did not occur: Safety/medical concerns(fatigue)       Wheelchair 150 feet activity     Assist Wheelchair 150 feet activity did not occur: Safety/medical concerns(fatigue)        Medical Problem List and Plan: 1.  Debility and acute encephalopathy secondary to cardiac arrest status post stenting/multi-medical             CIR  PT OT speech, Team conference today please see physician documentation under team conference tab, met with team face-to-face to discuss problems,progress, and goals. Formulized individual treatment plan based on medical history, underlying problem and comorbidities. . 2.  Antithrombotics: -DVT/anticoagulation: Eliquis             -antiplatelet therapy: Aspirin 81 mg daily, Plavix 75 mg daily.  Continue aspirin for 1 month and then Plavix for at least 6 months. 3. Pain Management: Tylenol as needed 4. Mood: Provide emotional support             -antipsychotic agents: Seroquel 25 mg nightly 5. Neuropsych: This patient is not capable of making decisions on his own behalf. 6. Skin/Wound Care: Routine skin checks 7. Fluids/Electrolytes/Nutrition: Routine in and outs.  Follow-up BMP tomorrow a.m. 8.  Acute diastolic congestive heart failure.  Monitor for signs of fluid overload 9.  PAF with RVR.  Continue Eliquis.  Amiodarone adjusted as per cardiology services.  Transition to amiodarone 200 mg daily 5/14 HR ok 10.  Hypertension.  Norvasc 5 mg daily, Avapro 75 mg daily, Lopressor 50 mg twice daily.  Monitor with increased  mobility Vitals:   09/05/18 1929 09/06/18 0554  BP: (!) 107/59 (!) 94/46  Pulse: 70 63  Resp: 18 19  Temp: 98 F (36.7 C) 98 F (36.7 C)  SpO2: 93% 100%  Controlled on current medications 5/20 11.  Diabetes mellitus.  Hemoglobin A1c 8.7.  Levemir 20 units twice daily.  Check blood sugars before meals and at bedtime.  Monitor with increased mobility. CBG (last 3)  Recent Labs    09/05/18 1655 09/05/18 2112 09/06/18 0620  GLUCAP 270* 242* 101*  add metformin  in ams 12.  Dysphagia.  Dysphagia #2 nectar liquids.  Follow-up speech therapy.  Advance as tolerated.  14.  Hyperlipidemia.  Lipitor 15.  Hypothyroidism.  Synthroid  LOS: 7 days A FACE TO FACE EVALUATION WAS PERFORMED  Charlett Blake 09/06/2018, 11:15 AM

## 2018-09-06 NOTE — Patient Care Conference (Signed)
Inpatient RehabilitationTeam Conference and Plan of Care Update Date: 09/06/2018   Time: 11:23 AM    Patient Name: [name] [name]      Medical Record Number: [id number]  Date of Birth: 06-11-1940 Sex: Male         Room/Bed: 4W22C/4W22C-01 Payor Info: Payor: [employer] MEDICARE / Plan: [employer] MEDICARE CHOICE PPO / Product Type: *No Product type* /    Admitting Diagnosis: CVA 2 Team  NTBI, enceph; 20-21days  Admit Date/Time:  08/30/2018  4:12 PM Admission Comments: No comment available   Primary Diagnosis:  <principal problem not specified> Principal Problem: <principal problem not specified>  Patient Active Problem List   Diagnosis Date Noted  . Debility 08/30/2018  . CAD S/P percutaneous coronary angioplasty 08/30/2018  . Atrial fibrillation, chronic 08/30/2018  . HTN (hypertension) 08/30/2018  . T2DM (type 2 diabetes mellitus) ([place]) 08/30/2018  . Acute diastolic congestive heart failure ([place])   . Aspiration pneumonia of both lungs ([place])   . Atrial fibrillation with rapid ventricular response ([place])   . Diabetes mellitus type 2 in nonobese ([facility])   . Dyslipidemia   . Diabetic retinopathy ([place]) 05/22/2017  . Peripheral vascular insufficiency ([place]) 03/18/2016  . Family history of heart disease 03/18/2016  . Right inguinal hernia 12/13/2013  . Essential hypertension 12/13/2013  . BPH (benign prostatic hyperplasia) 12/13/2013  . Hypothyroidism 08/07/2013  . Vitamin D deficiency 04/02/2013  . Goiter, nontoxic, multinodular   . Kidney stone   . Hyperlipidemia 01/15/2009  . ABNORMAL ELECTROCARDIOGRAM 01/15/2009  . Type 2 diabetes mellitus treated with insulin ([place]) 01/09/2009  . HIATAL HERNIA 01/09/2009  . BASAL CELL CARCINOMA, HX OF 01/09/2009  . DIVERTICULITIS, HX OF 01/09/2009    Expected Discharge Date: Expected Discharge Date: 09/12/18  Team Members Present: Physician leading conference: Dr. Alysia Penna Social Worker Present: Ovidio Kin, LCSW Nurse Present: Other  (comment)(meredith Surles-RN) PT Present: Michaelene Song, PT OT Present: Clyda Greener, OT SLP Present: Charolett Bumpers, SLP PPS Coordinator present : Gunnar Fusi     Current Status/Progress Goal Weekly Team Focus  Medical   unable to correct LOB, elevated CBG in pms, BPs soft but no dizziness  reduce fall risk, manage DM  adjust diabetic meds   Bowel/Bladder   Con./Bowel and Bladder.  Contiune to stay con. of Bowel and Bladder. Work having more frequant Bowel movements.  Working on more timely bowel movements.   Swallow/Nutrition/ Hydration   Upgraded to regular and thin, Min A  Supervision A  carryover of swallow stratgeies    ADL's   supervision for UB selfcare, min guard for LB selfcare, min assist for toilet transfers.  He demonstrates decreased awareness and decreased balance.    supervision overall  balance retraining, transfer training, selfcare retraining, therapeutic exercise, neuromuscular re-education   Mobility   supervision bed mobility, min assist ambulating with RW 58ft, supervision sit<>stand with intermittent min/mod assist for posterior LOB  independent bed mobility, supervision transfers and gait  standing balance, gait, LE strengthening, activity tolerance   Communication             Safety/Cognition/ Behavioral Observations  Min-Supervision A  Supervision A  complex problem solving, recall and emergent awareness   Pain   No c/o pain  Remain free from pain.  Remain free from pain.   Skin   Skin is dry but intact.   Keeping skin dry and intact free from tears.   [place] to have skin be clear of any rips and tears.      *  See Care Plan and progress notes for long and short-term goals.     Barriers to Discharge  Current Status/Progress Possible Resolutions Date Resolved   Physician    Medical stability     progressing  slowly         Nursing                  PT                    OT                  SLP                SW                 Discharge Planning/Teaching Needs:  Home with wife who can assist at DC. Son will come by and check on them and assist if needed.      Team Discussion:  Goals supervision level, safety and poor awareness with deficits. Diet upgraded to thin today. DM managed. Antibiotics finished. Family education tomorrow with wife and son and will see how goes before moving up DC date.  Revisions to Treatment Plan:  DC 5/26 unless family education goes well and can move up after family education tomorrow    Continued Need for Acute Rehabilitation Level of Care: The patient requires daily medical management by a physician with specialized training in physical medicine and rehabilitation for the following conditions: Daily direction of a multidisciplinary physical rehabilitation program to ensure safe treatment while eliciting the highest outcome that is of practical value to the patient.: Yes Daily medical management of patient stability for increased activity during participation in an intensive rehabilitation regime.: Yes Daily analysis of laboratory values and/or radiology reports with any subsequent need for medication adjustment of medical intervention for : Diabetes problems;Blood pressure problems;Other   I attest that I was present, lead the team conference, and concur with the assessment and plan of the team. Teleconference held due to [place], Gardiner Rhyme 09/06/2018, 11:23 AM

## 2018-09-06 NOTE — Progress Notes (Signed)
Physical Therapy Session Note  Patient Details  Name: [name] [name] MRN: [id number] Date of Birth: Mar 19, 1941  Today's Date: 09/06/2018 PT Individual Time: 0802-0900 PT Individual Time Calculation (min): 58 min   Short Term Goals: Week 1:  PT Short Term Goal 1 (Week 1): pt will move supine>< sit with supervision PT Short Term Goal 2 (Week 1): pt will transfer wiht mod assist of 1 person PT Short Term Goal 3 (Week 1): pt will propel wc x 50' with min assist PT Short Term Goal 4 (Week 1): pt will stand with bil UE support x 3 minutes and mod assist, during functional activity PT Short Term Goal 5 (Week 1): pt will initiate stairs  Skilled Therapeutic Interventions/Progress Updates:    Pt supine in bed upon PT arrival, agreeable to therapy tx and denies pain. Pt transferred to sitting EOB with supervision and donned clothing. Pt donned clothes with supervision while seated EOB, sit<>stand with CGA in order to pull pants over hips. Pt ambulated to the sink with RW and min assist, standing at the sink to brush hair with CGA for balance. Pt ambulated to the gym with RW and min assist x 150 ft, cues for upright posture. Pt performed floor transfer this session with cues for techniques and education on what to do in case of a fall at home. Pt ambulated to the steps and ascended/descended 8 steps this session with B rails and CGA, reciprocal pattern and cues for safety. Pt performed seated hamstring stretch with LE propped up on chair x 1 min for each LE, education on performing this stretch at home as part of HEP. Pt worked on standing balance and ambulation without AD, ambulated x 80 ft with min assist and then ambulated 80 ft while tossing catching ball min-mod assist, cues to limit narrow BOS and scissoring. Standing at parallel bars for UE support pt performed 2 x 10 standing hip abduction exercise for hip strengthening, cues for proper form and techniques. Pt worked on dynamic standing balance without  UE support to perform sidestepping in each direction 2 x 20 ft and then forwards/backwards ambulation 2 x 20 ft, min assist and cues for increased trunk extension. Pt ambulated back to room with CGA and RW x 150 ft. Pt anxious to go home soon, therapist educated pt on importance of staying here for continued therapy to work on balance, therapist also emphasized that we will need to complete family education with his wife/son prior to d/c.   Therapy Documentation Precautions:  Precautions Precautions: Fall Restrictions Weight Bearing Restrictions: No    Therapy/Group: Individual Therapy  Netta Corrigan, PT, DPT 09/06/2018, 7:41 AM

## 2018-09-06 NOTE — Progress Notes (Signed)
Occupational Therapy Session Note  Patient Details  Name: [name] [name] MRN: [id number] Date of Birth: Sep 24, 1940  Today's Date: 09/06/2018 OT Individual Time: 6967-8938 OT Individual Time Calculation (min): 53 min    Short Term Goals: Week 1:  OT Short Term Goal 1 (Week 1): Pt will complete toilet transfer with mod A OT Short Term Goal 2 (Week 1): Pt will sit EOB for 5 mins with no more than min A for balance OT Short Term Goal 3 (Week 1): Pt will complete 1 step of LB dressing task OT Short Term Goal 4 (Week 1): Pt will complete sit<>stand with mod A in preparation for BADL tasks  Skilled Therapeutic Interventions/Progress Updates:    Pt completed shaving at the sink in sitting to start session.  Min guard for functional mobility with use of the RW for support.  When ambulating from the sink to the bed to get his shirt, he did not use the RW and exhibited LOB with min assist needed to correct.  Next had pt ambulated down to the ADL apartment and the [place] room for practice with tub transfers.  He was able to step into and out of the tub with min assist using the smaller shower seat.  Min guard for transfer in and out using the tub bench as well.  Will discuss with family the need of shower seat vs shower bench during education tomorrow.  Next had pt work on dynamic standing balance in the ortho gym.  Had him focus on standing while completing ball toss to himself with a beach ball.  Frequent LOB posteriorly during task, requiring mod assist to correct.  Also had him ambulate to pick up the ball without use of an assistive device and min assist, with LOB noted with turning.  Finished session with ambulation back to the room with min guard assist using the RW.  Pt chose to continue sitting up in the recliner at the end of the session.  Call button and phone in reach with safety alarm pad in place.    Therapy Documentation Precautions:  Precautions Precautions: Fall Restrictions Weight  Bearing Restrictions: No  Pain: Pain Assessment Pain Scale: Faces Pain Score: 0-No pain ADL: See Care Tool Section for some details of ADLs  Therapy/Group: Individual Therapy  Shadiamond Koska OTR/L 09/06/2018, 3:59 PM

## 2018-09-07 ENCOUNTER — Ambulatory Visit (HOSPITAL_COMMUNITY): Payer: Medicare PPO | Admitting: Physical Therapy

## 2018-09-07 ENCOUNTER — Encounter (HOSPITAL_COMMUNITY): Payer: Medicare PPO | Admitting: Occupational Therapy

## 2018-09-07 ENCOUNTER — Inpatient Hospital Stay (HOSPITAL_COMMUNITY): Payer: Medicare PPO | Admitting: Occupational Therapy

## 2018-09-07 ENCOUNTER — Inpatient Hospital Stay (HOSPITAL_COMMUNITY): Payer: Medicare PPO | Admitting: Speech Pathology

## 2018-09-07 LAB — GLUCOSE, CAPILLARY: Glucose-Capillary: 130 mg/dL — ABNORMAL HIGH (ref 70–99)

## 2018-09-07 MED ORDER — CLOPIDOGREL BISULFATE 75 MG PO TABS: 75.0000 mg | ORAL_TABLET | Freq: Every day | ORAL | 0 refills | Status: DC

## 2018-09-07 MED ORDER — QUETIAPINE FUMARATE 25 MG PO TABS: 25.0000 mg | ORAL_TABLET | Freq: Every day | ORAL | 0 refills | Status: DC

## 2018-09-07 MED ORDER — AMLODIPINE BESYLATE 5 MG PO TABS: 5.0000 mg | ORAL_TABLET | Freq: Every day | ORAL | 0 refills | Status: DC

## 2018-09-07 MED ORDER — METFORMIN HCL 500 MG PO TABS: 500.0000 mg | ORAL_TABLET | Freq: Every day | ORAL | 1 refills | Status: DC

## 2018-09-07 MED ORDER — AMIODARONE HCL 200 MG PO TABS: 200.0000 mg | ORAL_TABLET | Freq: Every day | ORAL | 0 refills | Status: DC

## 2018-09-07 MED ORDER — VITAMIN D3 125 MCG (5000 UT) PO CAPS: 5000.0000 [IU] | ORAL_CAPSULE | ORAL | 0 refills | Status: DC

## 2018-09-07 MED ORDER — IRBESARTAN 75 MG PO TABS: 75.0000 mg | ORAL_TABLET | Freq: Every day | ORAL | 0 refills | Status: DC

## 2018-09-07 MED ORDER — METOPROLOL TARTRATE 50 MG PO TABS: 50.0000 mg | ORAL_TABLET | Freq: Two times a day (BID) | ORAL | 0 refills | Status: DC

## 2018-09-07 MED ORDER — ATORVASTATIN CALCIUM 80 MG PO TABS: 80.0000 mg | ORAL_TABLET | Freq: Every day | ORAL | 0 refills | Status: DC

## 2018-09-07 MED ORDER — APIXABAN 5 MG PO TABS: 5.0000 mg | ORAL_TABLET | Freq: Two times a day (BID) | ORAL | 0 refills | Status: DC

## 2018-09-07 MED ORDER — ACETAMINOPHEN 325 MG PO TABS: 650.0000 mg | ORAL_TABLET | ORAL | Status: AC | PRN

## 2018-09-07 MED ORDER — INSULIN DETEMIR 100 UNIT/ML FLEXPEN: 20.0000 [IU] | PEN_INJECTOR | Freq: Two times a day (BID) | SUBCUTANEOUS | 11 refills | Status: DC

## 2018-09-07 NOTE — Progress Notes (Signed)
Physical Therapy Discharge Summary  Patient Details  Name: BENJAMIM HARNISH MRN: [id number] Date of Birth: 06/12/1940  Today's Date: 09/07/2018 PT Individual Time: 1020-1100 PT Individual Time Calculation (min): 40 min    Patient has met 1 of 10 long term goals due to improved activity tolerance, improved balance, improved postural control, increased strength, ability to compensate for deficits, improved attention, improved awareness and improved coordination.  Patient to discharge at an ambulatory level Min Assist/CGA. Therapist expressed concern regarding patient's wife's ability to provide the necessary physical assistance at discharge.  Reasons goals not met: Patient continues to require CGA for transfers, ambulation with RW, and stair navigation to maintain safety and balance - goals were set at supervision level.  Recommendation:  Patient will benefit from ongoing skilled PT services in home health setting to continue to advance safe functional mobility, address ongoing impairments in standing balance, B LE strength, activity tolerance,and safety awareness, as well as minimize fall risk.  Equipment: Recommend RW.  Reasons for discharge: Therapist provided extensive education to patient and family regarding increased fall risk in the home; however, family reports confidence with assitsing pt at home.  Patient/family agrees with progress made and goals achieved: Yes  Skilled Therapeutic Interventions/Progress Updates:  Patient received sitting in recliner with pt's wife and son present for family education and pt agreeable to therapy session. Pt, pt's wife, and son educated on pt's need to use RW and need for hands-on CGA for all standing/ambulatory tasks at home due to increased risk for falls. Pt, pt's wife, and pt's son educated extensively on fall risk safety in the home including proper positioning for wife to provide necessary assist,  need to use gait belt, proper footwear, picking  up rugs, as well as education on pt's decreased safety awareness and inadequate balance recovery strategies. Pt ambulated ~247f using RW to ortho gym with pt's wife providing CGA. Pt performed car transfer with pt's wife providing CGA and therapist providing verbal/visual cuing for proper sequencing of transfer. Pt ambulated ~135fup/down ramp x 2 with pt's wife providing CGA and therapist providing close supervision and 1x min assist due to pt demonstrating L lateral LOB when turning with feet scissoring - pt/pt's family educated on turning slow, improved AD management, and improved positioning of pt's wife to provide assist. Pt ambulated ~10076fo ADL apartment with [place] pt's wife to practice RW management in home environment with therapist providing visual demonstration and education on proper AD management. Pt and pt's wife required mod cuing for safe AD management as well as proper positioning of pt's wife to provide assistance throughout the task  - pt and pt's wife demonstrated lack of carryover of education with therapist providing reinforcement of education at end. Pt ambulated ~100f12fing RW with CGA from wife to main gym. Ascended/descended 12 steps using bilateral handrails with CGA and 1x min assist due to posterior lean when turning around. Pt ambulated ~150ft19fk to room using RW with CGA from pt's wife. Pt, pt's wife, and pt's son educated on therapist's recommendation for continued high intensity inpatient rehab prior to D/C home due to pt's increased risk for falls; however, pt and family report feeling confident with assisting pt at home and pt reports he is ready to go home. Therefore therapist educated pt/family on recommendation for follow-up HHPT to continue addressing impairments and decrease fall risk. Pt left sitting in recliner with family present.   PT Discharge Precautions/Restrictions Precautions Precautions: Fall Restrictions Weight Bearing Restrictions: No  Pain Pain  Assessment Pain Scale: 0-10 Pain Score: 0-No pain Pain Intervention(s): Other (Comment)(therapy to tolerance) Vision/Perception  Perception Perception: Within Functional Limits Praxis Praxis: Intact  Cognition Overall Cognitive Status: Impaired/Different from baseline Arousal/Alertness: Awake/alert Orientation Level: Oriented X4 Attention: Focused;Sustained Focused Attention: Appears intact Sustained Attention: Appears intact Awareness: Impaired Awareness Impairment: Anticipatory impairment;Emergent impairment Behaviors: Impulsive Safety/Judgment: Impaired Comments: Pt with decreased awareness of his balance deficits.  Sensation Sensation Light Touch: Appears Intact Hot/Cold: Not tested Proprioception: Impaired by gross assessment(continues to demonstrate lack of awareness of LEs when turning) Stereognosis: Not tested Coordination Gross Motor Movements are Fluid and Coordinated: No Coordination and Movement Description: Demonstrates impaired B LE coordination when turning resulting in scissoring gait Motor  Motor Motor: Other (comment) Motor - Skilled Clinical Observations: Generalized weakness  Mobility Transfers Transfers: Sit to Stand;Stand to Sit;Stand Pivot Transfers Sit to Stand: Contact Guard/Touching assist Stand to Sit: Contact Guard/Touching assist Stand Pivot Transfers: Contact Guard/Touching assist Stand Pivot Transfer Details: Verbal cues for precautions/safety;Verbal cues for sequencing;Verbal cues for technique;Visual cues/gestures for sequencing;Verbal cues for safe use of DME/AE Transfer (Assistive device): Rolling walker Locomotion  Gait Ambulation: Yes Gait Assistance: Contact Guard/Touching assist Gait Distance (Feet): 150 Feet Assistive device: Rolling walker Gait Assistance Details: Verbal cues for technique;Verbal cues for gait pattern;Verbal cues for precautions/safety;Verbal cues for safe use of DME/AE;Visual cues for safe use of  DME/AE Gait Gait: Yes Gait Pattern: Impaired Gait Pattern: Narrow base of support;Decreased step length - right;Decreased step length - left;Scissoring;Trunk flexed;Poor foot clearance - right;Poor foot clearance - left Stairs / Additional Locomotion Stairs: Yes Stairs Assistance: Contact Guard/Touching assist Stair Management Technique: Two rails Number of Stairs: 12 Height of Stairs: 6 Ramp: Minimal Assistance - Patient >75%(ambulating with RW) Wheelchair Mobility Wheelchair Mobility: No  Trunk/Postural Assessment  Cervical Assessment Cervical Assessment: Exceptions to WFL(cervical protraction) Thoracic Assessment Thoracic Assessment: Exceptions to WFL(rounded shoulders) Lumbar Assessment Lumbar Assessment: Exceptions to WFL(posterior pelvic tilt) Postural Control Postural Control: Deficits on evaluation Righting Reactions: delayed and inadequate Protective Responses: delayed and insufficient Postural Limitations: decreased  Balance Balance Balance Assessed: Yes Static Sitting Balance Static Sitting - Balance Support: Feet supported Static Sitting - Level of Assistance: 7: Independent Dynamic Sitting Balance Dynamic Sitting - Balance Support: Feet supported;During functional activity Dynamic Sitting - Level of Assistance: 5: Stand by assistance Static Standing Balance Static Standing - Balance Support: During functional activity;Bilateral upper extremity supported Static Standing - Level of Assistance: 5: Stand by assistance Dynamic Standing Balance Dynamic Standing - Balance Support: During functional activity Dynamic Standing - Level of Assistance: 5: Stand by assistance Extremity Assessment      RLE Assessment General Strength Comments: grossly 4/5 demonstrated during functional mobility LLE Assessment LLE Assessment: Exceptions to [facility] General Strength Comments: grossly 4/5 demonstrated during functional mobility    Tawana Scale, PT, DPT 09/07/2018, 1:03  PM

## 2018-09-07 NOTE — Discharge Instructions (Signed)
Inpatient Rehab Discharge Instructions  [name] [name] Discharge date and time: No discharge date for patient encounter.   Activities/Precautions/ Functional Status: Activity: As tolerated Diet: Diabetic diet Wound Care: Routine skin checks Functional status:  ___ No restrictions     ___ Walk up steps independently ___ 24/7 supervision/assistance   ___ Walk up steps with assistance ___ Intermittent supervision/assistance  ___ Bathe/dress independently ___ Walk with walker     _x__ Bathe/dress with assistance ___ Walk Independently    ___ Shower independently ___ Walk with assistance    ___ Shower with assistance ___ No alcohol     ___ Return to work/school ________  Special Instructions: No driving smoking or alcohol    COMMUNITY REFERRALS UPON DISCHARGE:    Home Health:   PT & SPT     Agency:[facility] Phone:[phone]   Date of last service:09/07/2018  Medical Equipment/Items Ordered:HAS ALL EQUIPMENT   My questions have been answered and I understand these instructions. I will adhere to these goals and the provided educational materials after my discharge from the hospital.  Patient/Caregiver Signature _______________________________ Date __________  Clinician Signature _______________________________________ Date __________  Please bring this form and your medication list with you to all your follow-up doctor's appointments.

## 2018-09-07 NOTE — Discharge Summary (Signed)
Physician Discharge Summary  Patient ID: [name] [name] MRN: [id number] DOB/AGE: [birth year] 78 y.o.  Admit date: 08/30/2018 Discharge date: 09/07/2018  Discharge Diagnoses:  Active Problems:   Debility   Acute diastolic congestive heart failure (HCC)   Aspiration pneumonia of both lungs (HCC)   Atrial fibrillation with rapid ventricular response (HCC)   Diabetes mellitus type 2 in nonobese ([place])   Dyslipidemia Hypertension Hypothyroidism  Discharged Condition: Stable  Significant Diagnostic Studies: Dg Chest 1 View  Result Date: 08/19/2018 CLINICAL DATA:  Hypoxemia EXAM: CHEST  1 VIEW COMPARISON:  08/18/2018 FINDINGS: Perihilar and lower lobe airspace opacities, likely edema. Low lung volumes. Small left pleural effusion. Heart is borderline in size. Endotracheal tube and NG tube are unchanged. IMPRESSION: Perihilar and lower lobe opacities, likely edema. Small left effusion. Electronically Signed   By: Rolm Baptise M.D.   On: 08/19/2018 19:50   Dg Chest 2 View  Result Date: 08/30/2018 CLINICAL DATA:  Aspiration pneumonia. Choking episode on 08/17/2018. Cardiac arrest. EXAM: CHEST - 2 VIEW COMPARISON:  11/24/2017 FINDINGS: Heart size is normal. Aortic atherosclerosis is noted. Coronary artery stent. There is atelectasis in both lower lobes, left worse than right. Small effusions in the posterior costophrenic angles. IMPRESSION: Bilateral lower lobe atelectasis/pneumonia left more than right with small effusions. Electronically Signed   By: Nelson Chimes M.D.   On: 08/30/2018 19:17   Dg Abd 1 View  Result Date: 08/21/2018 CLINICAL DATA:  Check feeding catheter placement EXAM: ABDOMEN - 1 VIEW COMPARISON:  None. FINDINGS: Scattered large and small bowel gas is noted. Gastric catheter is noted within the distal stomach/proximal duodenum. The feeding catheter is been placed and lies within the fourth portion of the duodenum. No free air is seen. No bony abnormality is noted. IMPRESSION: Feeding  catheter within the fourth portion of the duodenum. Electronically Signed   By: Inez Catalina M.D.   On: 08/21/2018 16:00   Ct Head Wo Contrast  Result Date: 08/21/2018 CLINICAL DATA:  Unexplained altered level of consciousness. EXAM: CT HEAD WITHOUT CONTRAST TECHNIQUE: Contiguous axial images were obtained from the base of the skull through the vertex without intravenous contrast. COMPARISON:  08/17/2018. FINDINGS: Brain: No evidence for acute infarction, hemorrhage, mass lesion, hydrocephalus, or extra-axial fluid. Generalized atrophy. Hypoattenuation of white matter, likely small vessel disease. Vascular: Calcification of the cavernous internal carotid arteries consistent with cerebrovascular atherosclerotic disease. No signs of intracranial large vessel occlusion. Skull: Normal. Negative for fracture or focal lesion. Sinuses/Orbits: Layering fluid in both divisions of the sphenoid as well as the LEFT maxillary sinus. Other: BILATERAL middle ear and mastoid fluid. Continued endotracheal intubation. IMPRESSION: Atrophy and small vessel disease similar to priors. No acute intracranial findings. Layering sinus fluid as well as BILATERAL middle ear and mastoid fluid. Continued surveillance warranted. Electronically Signed   By: Staci Righter M.D.   On: 08/21/2018 14:33   Ct Head Wo Contrast  Result Date: 08/17/2018 CLINICAL DATA:  Head injury, post CPR, unresponsive EXAM: CT HEAD WITHOUT CONTRAST CT CERVICAL SPINE WITHOUT CONTRAST TECHNIQUE: Multidetector CT imaging of the head and cervical spine was performed following the standard protocol without intravenous contrast. Multiplanar CT image reconstructions of the cervical spine were also generated. COMPARISON:  None. FINDINGS: CT HEAD FINDINGS Brain: No evidence of acute infarction, hemorrhage, hydrocephalus, extra-axial collection or mass lesion/mass effect. Periventricular white matter hypodensity. Vascular: No hyperdense vessel or unexpected calcification.  Skull: Normal. Negative for fracture or focal lesion. Sinuses/Orbits: No acute finding. Other: None. CT CERVICAL  SPINE FINDINGS Alignment: Normal. Skull base and vertebrae: No acute fracture. No primary bone lesion or focal pathologic process. Soft tissues and spinal canal: No prevertebral fluid or swelling. No visible canal hematoma. Disc levels: Generally mild multilevel disc degenerative disease and osteophytosis. Upper chest: Negative. Other: Partially imaged endotracheal and orogastric tubes. IMPRESSION: 1. No acute intracranial pathology. Small-vessel white matter disease. 2.  No fracture or static subluxation of the cervical spine. Electronically Signed   By: Eddie Candle M.D.   On: 08/17/2018 16:51   Ct Cervical Spine Wo Contrast  Result Date: 08/17/2018 CLINICAL DATA:  Head injury, post CPR, unresponsive EXAM: CT HEAD WITHOUT CONTRAST CT CERVICAL SPINE WITHOUT CONTRAST TECHNIQUE: Multidetector CT imaging of the head and cervical spine was performed following the standard protocol without intravenous contrast. Multiplanar CT image reconstructions of the cervical spine were also generated. COMPARISON:  None. FINDINGS: CT HEAD FINDINGS Brain: No evidence of acute infarction, hemorrhage, hydrocephalus, extra-axial collection or mass lesion/mass effect. Periventricular white matter hypodensity. Vascular: No hyperdense vessel or unexpected calcification. Skull: Normal. Negative for fracture or focal lesion. Sinuses/Orbits: No acute finding. Other: None. CT CERVICAL SPINE FINDINGS Alignment: Normal. Skull base and vertebrae: No acute fracture. No primary bone lesion or focal pathologic process. Soft tissues and spinal canal: No prevertebral fluid or swelling. No visible canal hematoma. Disc levels: Generally mild multilevel disc degenerative disease and osteophytosis. Upper chest: Negative. Other: Partially imaged endotracheal and orogastric tubes. IMPRESSION: 1. No acute intracranial pathology. Small-vessel  white matter disease. 2.  No fracture or static subluxation of the cervical spine. Electronically Signed   By: Eddie Candle M.D.   On: 08/17/2018 16:51   Dg Chest Port 1 View  Result Date: 08/25/2018 CLINICAL DATA:  Patient admitted 08/17/2018 with respiratory failure and acute encephalopathy. EXAM: PORTABLE CHEST 1 VIEW COMPARISON:  Single-view of the chest 08/18/2018 and 08/19/2018. FINDINGS: ETT and NG tube have been removed. Right PICC remains in place. Lung volumes are low. Bilateral airspace disease has worsened. Lung volumes are low. Heart size is normal. No pneumothorax. Small left pleural effusion. IMPRESSION: Status post extubation and NG tube removal. Increased basilar airspace opacities could be due to worsened atelectasis although progressive pneumonia and/or edema could create a similar appearance. Electronically Signed   By: Inge Rise M.D.   On: 08/25/2018 08:47   Dg Chest Port 1 View  Result Date: 08/18/2018 CLINICAL DATA:  Cardiac arrest and respiratory failure. EXAM: PORTABLE CHEST 1 VIEW COMPARISON:  08/17/2018 FINDINGS: Endotracheal tube remains with the tip approximately 2 cm above the carina. Gastric decompression tube extends into the stomach. Lungs show improved aeration on the right with mild subsegmental atelectasis an a perihilar location. There is slight increase in left lower lobe atelectasis with potential component of a small left pleural effusion. No pulmonary edema or pneumothorax identified. No visible bony fractures. IMPRESSION: Improved aeration of the right lung. Increase in left lower lobe atelectasis with potential small left pleural effusion. Electronically Signed   By: Aletta Edouard M.D.   On: 08/18/2018 07:40   Dg Chest Port 1 View  Result Date: 08/17/2018 CLINICAL DATA:  Intubation EXAM: PORTABLE CHEST 1 VIEW COMPARISON:  Portable exam 1545 hours without priors for comparison FINDINGS: Tip of endotracheal tube projects 3.4 cm above carina. Nasogastric  tube extends into stomach. Normal heart size, mediastinal contours, and pulmonary vascularity. Atherosclerotic calcification aorta. Subsegmental atelectasis in RIGHT upper lobe and at LEFT base. Additional perihilar and LEFT lower lobe infiltrates. No pleural effusion or  pneumothorax. IMPRESSION: BILATERAL pulmonary infiltrates and scattered atelectasis as above. Electronically Signed   By: Lavonia Dana M.D.   On: 08/17/2018 16:30   [place] Ekg Site Rite  Result Date: 08/17/2018 If Site Rite image not attached, placement could not be confirmed due to current cardiac rhythm.   Labs:  Basic Metabolic Panel: No results for input(s): NA, K, CL, CO2, GLUCOSE, BUN, CREATININE, CALCIUM, MG, PHOS in the last 168 hours.  CBC: No results for input(s): WBC, NEUTROABS, HGB, HCT, MCV, PLT in the last 168 hours.  CBG: Recent Labs  Lab 09/06/18 0620 09/06/18 1218 09/06/18 1727 09/06/18 2101 09/07/18 0649  GLUCAP 101* 234* 180* 310* 130*  Family history positive for diabetes mellitus.  Positive for hypertension.  Denies any cancer  Brief HPI:    [name] [name] is a 78 year old right-handed male with history of hypertension diabetes mellitus.  Lives with spouse independent prior to admission.  Presented 08/17/2018 after witnessed cardiac arrest.  CPR initiated by bystander.  By report patient initially complained of fatigue possibly low blood sugar while trying to eat a muffin before witnessed arrest.  He reportedly struck his head when he collapsed.  He received 11 minutes of CPR and was intubated.  Admitted 08/17/2018.  COVID negative.  Work-up revealed leukocytosis troponin 0 0.03.  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  Hospital course complicated by atrial fibrillation with RVR maintained on aspirin Plavix as well as Eliquis and amiodarone was adjusted per cardiology services.  Patient did complete a 7-day course of cefepime for  questionable aspiration pneumonia.  His diet was slowly advanced.  Ongoing bouts of confusion suspect encephalopathy related to cardiac arrest EEG negative for seizure.  Patient was admitted for a comprehensive rehab program.  Hospital Course: [name] [name] was admitted to rehab 08/30/2018 for inpatient therapies to consist of PT, ST and OT at least three hours five days a week. Past admission physiatrist, therapy team and rehab RN have worked together to provide customized collaborative inpatient rehab.  Pertaining to patient's encephalopathy related to cardiac arrest he had undergone stenting.  Remain on aspirin Plavix therapy as well as Eliquis per cardiology services.  He was attending full therapies.  Cardiac rate controlled no chest pain or shortness of breath.  Blood pressures controlled and monitored on Norvasc as well as Lopressor and Avapro.  He was sleeping much better at night with the addition of Seroquel and will continue this.  Blood sugars overall controlled he continued on low-dose Glucophage would follow-up with his primary MD as well as Levemir 20 units twice daily.  Physical exam.  Blood pressure 143/66 pulse 70 temperature 97.8 respirations 22 oxygen saturations 98% room air Constitutional well-developed well-nourished HEENT Head.  Normocephalic and atraumatic with poor dentition Eyes.  EOMs normal no discharge without nystagmus Respiratory effort normal no respiratory distress without wheeze GI soft nontender no distention without rebound Musculoskeletal no edema or tenderness in extremities. Neurological.  Patient was alert and oriented to person follow commands limited medical historian strength grossly graded 5 out of 5 throughout.  Rehab course: During patient's stay in rehab weekly team conferences were held to monitor patient's progress, set goals and discuss barriers to discharge. At admission, patient required mod max assist ambulate 5 feet rolling walker, total assist  lateral scoot transfers, moderate assist sit to supine.  Minimal assist upper body bathing max assist lower body bathing minimal assist upper body dressing max assist lower body  dressing  He  has had improvement in activity tolerance, balance, postural control as well as ability to compensate for deficits. He has had improvement in functional use RUE/LUE  and RLE/LLE as well as improvement in awareness.  Patient transferred to sitting edge of bed supervision and could don his clothes.  Sit to stand contact-guard assist ambulate 150 feet rolling walker minimal assistance.  He can gather his belongings for activities the living and homemaking.  It was advised the need for safety due to some recall issues.  Full family teaching was completed       Disposition:  Discharged home   Diet: Diabetic diet  Special Instructions: No smoking driving or alcohol  Medications at discharge 1.  Tylenol as needed 2.  Amiodarone 200 mg p.o. daily 3.  Norvasc 5 mg p.o. daily 4.  Eliquis 5 mg p.o. twice daily 5.  Aspirin 81 mg p.o. daily 6.  Lipitor 80 mg p.o. daily 7.  Plavix 75 mg p.o. daily 8.  Levemir 20 units twice daily 9.  Avapro 75 mg p.o. daily 10.  Synthroid 75 mcg p.o. daily 11.  Glucophage 500 mg p.o. daily 12.  Lopressor 50 mg p.o. twice daily 13.  Seroquel 25 mg p.o. nightly  Allergies as of 09/07/2018      Reactions   Actos [pioglitazone Hydrochloride] Swelling   Fluid retention   Lisinopril Rash   Invokana [canagliflozin] Rash   Niaspan [niacin Er] Other (See Comments)   Increased blood sugar   Ramipril Diarrhea      Medication List    STOP taking these medications   cefdinir 300 MG capsule Commonly known as:  OMNICEF   [facility] CINNAMON PO   Garlic 4782 MG Caps   glimepiride 2 MG tablet Commonly known as:  AMARYL   insulin aspart 100 UNIT/ML injection Commonly known as:  novoLOG   potassium chloride SA 20 MEQ tablet Commonly known as:  K-DUR   quinapril 20 MG  tablet Commonly known as:  ACCUPRIL   zolpidem 5 MG tablet Commonly known as:  AMBIEN     TAKE these medications   acetaminophen 325 MG tablet Commonly known as:  TYLENOL Take 2 tablets (650 mg total) by mouth every 4 (four) hours as needed for mild pain or headache.   amiodarone 200 MG tablet Commonly known as:  PACERONE Take 1 tablet (200 mg total) by mouth daily. Start taking on:  Sep 08, 2018 What changed:    medication strength  how much to take  when to take this   amLODipine 5 MG tablet Commonly known as:  NORVASC Take 1 tablet (5 mg total) by mouth daily.   apixaban 5 MG Tabs tablet Commonly known as:  ELIQUIS Take 1 tablet (5 mg total) by mouth 2 (two) times daily.   aspirin 81 MG tablet Take 81 mg by mouth daily.   aspirin EC 81 MG tablet Take 81 mg by mouth daily.   atorvastatin 80 MG tablet Commonly known as:  LIPITOR Take 1 tablet (80 mg total) by mouth daily at 6 PM.   clopidogrel 75 MG tablet Commonly known as:  PLAVIX Take 1 tablet (75 mg total) by mouth daily with breakfast.   fluticasone 50 MCG/ACT nasal spray Commonly known as:  FLONASE One to 2 sprays each nostril at bedtime   fluticasone 50 MCG/ACT nasal spray Commonly known as:  FLONASE Place 2 sprays into both nostrils daily as needed for allergies.   glucose blood test strip  Check BS TID and [id number]   Insulin Detemir 100 UNIT/ML Pen Commonly known as:  LEVEMIR Inject 20 Units into the skin 2 (two) times daily. What changed:  Another medication with the same name was removed. Continue taking this medication, and follow the directions you see here.   irbesartan 75 MG tablet Commonly known as:  AVAPRO Take 1 tablet (75 mg total) by mouth daily.   levothyroxine 75 MCG tablet Commonly known as:  SYNTHROID Take 1 tablet (75 mcg total) by mouth daily.   levothyroxine 75 MCG tablet Commonly known as:  SYNTHROID Take 75 mcg by mouth daily.   metFORMIN 500 MG  tablet Commonly known as:  GLUCOPHAGE Take 1 tablet (500 mg total) by mouth daily with breakfast. Start taking on:  Sep 08, 2018 What changed:    medication strength  how much to take  when to take this  Another medication with the same name was removed. Continue taking this medication, and follow the directions you see here.   metoprolol tartrate 50 MG tablet Commonly known as:  LOPRESSOR Take 1 tablet (50 mg total) by mouth 2 (two) times daily.   QUEtiapine 25 MG tablet Commonly known as:  SEROQUEL Take 1 tablet (25 mg total) by mouth at bedtime.   Vitamin D3 125 MCG (5000 UT) Caps Take 1 capsule (5,000 Units total) by mouth once a week. Take 1 capsule three times per week What changed:  when to take this      Follow-up Information    Kirsteins, Luanna Salk, MD Follow up.   Specialty:  Physical Medicine and Rehabilitation Why:  only as needed Contact information: [place] [address] [phone]           Signed: Cathlyn Parsons 09/07/2018, 9:55 AM

## 2018-09-07 NOTE — Progress Notes (Signed)
Social Work Discharge Note  The overall goal for the admission was met for: DC today  Discharge location: Yes-HOME WITH WIFE AND SON TO ASSIST  Length of Stay: Yes-7 DAYS  Discharge activity level: Yes-SUPERVISION LEVEL  Home/community participation: Yes  Services provided included: MD, RD, PT, OT, SLP, RN, CM, Pharmacy and SW  Financial Services: Private Insurance: [employer]  Follow-up services arranged: Home Health: [facility]-PT & SPT and Patient/Family has no preference for HH/DME agencies  Comments (or additional information): WIFE AND SON HERE FOR EDUCATION AND IT WENT WELL PT WANTS TO GO HOME AND MD FEELS HE CAN BE [place].  Patient/Family verbalized understanding of follow-up arrangements: Yes  Individual responsible for coordination of the follow-up plan: WIFE AND SON  Confirmed correct DME delivered: Elease Hashimoto 09/07/2018    Elease Hashimoto

## 2018-09-07 NOTE — Progress Notes (Signed)
Speech Language Pathology Discharge Summary  Patient Details  Name: [name] [name] MRN: [id number] Date of Birth: Jan 05, 1941  Today's Date: 09/07/2018 SLP Individual Time: 4481-8563 SLP Individual Time Calculation (min): 30 min   Skilled Therapeutic Interventions:   Pt participated in skilled ST intervention focused on family education with wife and son. SLP reviewed medications and dosage timing, and encouraged family to assist with med management for safety. Safe swallow precautions were reviewed, with education regarding minimized distractions, and small bites/sips at slow rate. Pt continues to exhibit difficulty with cognitive functions, especially recall, reasoning, and awareness of deficits and functional impact. 24 hour supervision is recommended for safety. Family and pt were provided opportunity to ask questions, and these were answered to their satisfaction.  Patient has met 3 of 3 long term goals.  Patient to discharge at overall Supervision level.  Reasons goals not met:     Clinical Impression/Discharge Summary: Pt has demonstrated tolerance of regular texture solids and thin liquids, and has demonstrated improvement in use of compensatory strategies for recall. Pt continues to demonstrate higher level cognitive impairments which may impact safety if left alone. 24 hour supervision is recommended to maximize safety and independence. Home health speech therapy may be beneficial to continue skilled treatment of problem solving, reasoning, attention, and safety awareness to minimize careegiver burden.   Care Partner:  Caregiver Able to Provide Assistance: Yes  Type of Caregiver Assistance: Cognitive  Recommendation:  24 hour supervision/assistance;Home Health SLP  Rationale for SLP Follow Up: Reduce caregiver burden;Maximize cognitive function and independence   Equipment:    None  Reasons for discharge: Discharged from hospital   Patient/Family Agrees with Progress Made and  Goals Achieved: Yes   Celia B. Quentin Ore, [facility], [place] Speech Language Pathologist  Shonna Chock 09/07/2018, 3:12 PM

## 2018-09-07 NOTE — Progress Notes (Signed)
Occupational Therapy Discharge Summary  Patient Details  Name: [name] [name] MRN: [id number] Date of Birth: 1940/06/08  Today's Date: 09/07/2018 OT Individual Time: 5361-4431 OT Individual Time Calculation (min): 42 min    Session Note:  Pt's family in for education this session (spouse and his son).  Discussed current progress with pt and family to start session as well as concerns with dynamic standing balance as it relates to self care tasks.  Pt impulsive on one occasion, standing to tuck his shirt in before therapist was beside of him.  He was able to use the RW for mobility down to the tub/shower room where he completed step over transfer to small shower seat with supervision.  Discussed with family the need for a shower seat vs shower bench as well as a hand held shower.  His son will look to see if he has one already and will purchase a seat if needed.  Expressed that the son needs to be with him or therapy to complete showering initially at home.  Next had pt ambulate to the therapy gym with supervision where he worked on higher level balance tasks.  He was able to stand statically with close supervision and complete head turns and reaching in all directions without LOB.  Posterior LOB noted with standing and tossing a beach ball to himself.  He was able to ambulate without use of the RW and then pick up cones from the floor with increased time with supervision.   Had pt work on standing on foam as well with eyes open, eyes closed, and with ball toss with son.  Mod assist needed when tossing the ball secondary to posterior LOB with min assist when standing with eyes closed.  Finished session with ambulation back to the room and spouse providing supervision.  Pt left sitting up in the recliner with call button and phone in reach.  Patient has met 12 of 12 long term goals due to improved activity tolerance, improved balance and ability to compensate for deficits.  Patient to discharge at overall  Supervision level.  Patient's care partner is independent to provide the necessary physical and cognitive assistance at discharge.    Reasons goals not met: NA  Recommendation:  Patient will benefit from ongoing skilled OT services in home health setting to continue to advance functional skills in the area of BADL, iADL and Reduce care partner burden.  Pt still needs constant 24 hour supervision for safety and for balance when completing self care tasks.  He continues to demonstrate delayed balance reactions with dynamic standing tasks and exhibits decreased awareness of these balance issues.  Have educated spouse and pt's son on the need for 24 hour supervision and continued HHOT.    Equipment: No equipment provided  Reasons for discharge: treatment goals met and discharge from hospital  Patient/family agrees with progress made and goals achieved: Yes  OT Discharge Precautions/Restrictions  Precautions Precautions: Fall Restrictions Weight Bearing Restrictions: No  Pain Pain Assessment Pain Scale: 0-10 Pain Score: 0-No pain ADL ADL Eating: Supervision/safety Where Assessed-Eating: Chair Grooming: Supervision/safety Where Assessed-Grooming: Standing at sink Upper Body Bathing: Supervision/safety Where Assessed-Upper Body Bathing: Shower Lower Body Bathing: Supervision/safety Where Assessed-Lower Body Bathing: Shower Upper Body Dressing: Supervision/safety Where Assessed-Upper Body Dressing: Chair Lower Body Dressing: Supervision/safety Where Assessed-Lower Body Dressing: Chair Toileting: Supervision/safety Where Assessed-Toileting: Glass blower/designer: Close supervision Toilet Transfer Method: Theatre manager Transfer: Close supervison Clinical cytogeneticist Method: Librarian, academic: Civil engineer, contracting with back [employer]  Transfer: Close supervision Social research officer, government Method: Heritage manager: Civil engineer, contracting without  back Vision Baseline Vision/History: Wears glasses Wears Glasses: Reading only Vision Assessment?: No apparent visual deficits Perception  Perception: Within Functional Limits Praxis Praxis: Intact Cognition Overall Cognitive Status: Impaired/Different from baseline Arousal/Alertness: Awake/alert Orientation Level: Oriented to person;Oriented to place;Oriented to time;Oriented to situation Attention: Focused;Sustained Focused Attention: Appears intact Sustained Attention: Appears intact Selective Attention: Impaired Selective Attention Impairment: Verbal basic;Functional basic Memory: Impaired Memory Impairment: Storage deficit;Retrieval deficit;Decreased recall of new information;Decreased short term memory Decreased Short Term Memory: Verbal basic;Functional basic Awareness: Impaired Awareness Impairment: Anticipatory impairment;Emergent impairment Executive Function: Reasoning;Organizing Reasoning: Impaired Reasoning Impairment: Functional basic;Functional complex Organizing: Impaired Organizing Impairment: Verbal basic Behaviors: Impulsive Safety/Judgment: Impaired Comments: Pt with decreased awareness of his balance deficits.   Sensation Sensation Light Touch: Appears Intact Hot/Cold: Appears Intact Proprioception: Appears Intact Stereognosis: Appears Intact Coordination Gross Motor Movements are Fluid and Coordinated: Yes Fine Motor Movements are Fluid and Coordinated: Yes Coordination and Movement Description: BUE coordination WFLs Motor  Motor Motor - Skilled Clinical Observations: Generalized weakness Mobility  Transfers Sit to Stand: Supervision/Verbal cueing Stand to Sit: Supervision/Verbal cueing  Trunk/Postural Assessment  Cervical Assessment Cervical Assessment: Exceptions to WFL(cervical flexion and protraction) Thoracic Assessment Thoracic Assessment: Exceptions to WFL(thoracic kyphosis) Lumbar Assessment Lumbar Assessment: Exceptions to  WFL(maintains posterior pelvic tilt) Postural Control Righting Reactions: still delayed at discharge but improved  Balance Balance Balance Assessed: Yes Static Sitting Balance Static Sitting - Balance Support: Feet supported Static Sitting - Level of Assistance: 7: Independent Dynamic Sitting Balance Dynamic Sitting - Balance Support: Feet supported;During functional activity Dynamic Sitting - Level of Assistance: 5: Stand by assistance Static Standing Balance Static Standing - Balance Support: During functional activity;Bilateral upper extremity supported Static Standing - Level of Assistance: 5: Stand by assistance Dynamic Standing Balance Dynamic Standing - Balance Support: During functional activity Dynamic Standing - Level of Assistance: 5: Stand by assistance Extremity/Trunk Assessment RUE Assessment RUE Assessment: Within Functional Limits LUE Assessment LUE Assessment: Within Functional Limits   Sahiba Granholm OTR/L 09/07/2018, 5:13 PM

## 2018-09-13 ENCOUNTER — Telehealth: Payer: Self-pay | Admitting: *Deleted

## 2018-09-13 ENCOUNTER — Encounter: Payer: Medicare PPO | Admitting: *Deleted

## 2018-09-13 DIAGNOSIS — I10 Essential (primary) hypertension: Secondary | ICD-10-CM

## 2018-09-13 DIAGNOSIS — E1169 Type 2 diabetes mellitus with other specified complication: Secondary | ICD-10-CM

## 2018-09-13 DIAGNOSIS — I2510 Atherosclerotic heart disease of native coronary artery without angina pectoris: Secondary | ICD-10-CM

## 2018-09-13 NOTE — Chronic Care Management (AMB) (Signed)
Erroneous encounter

## 2018-09-15 ENCOUNTER — Telehealth: Payer: Medicare PPO | Admitting: *Deleted

## 2018-09-20 ENCOUNTER — Other Ambulatory Visit: Payer: Self-pay

## 2018-09-20 ENCOUNTER — Ambulatory Visit (INDEPENDENT_AMBULATORY_CARE_PROVIDER_SITE_OTHER): Payer: Medicare PPO | Admitting: *Deleted

## 2018-09-20 DIAGNOSIS — Z0000 Encounter for general adult medical examination without abnormal findings: Secondary | ICD-10-CM

## 2018-09-20 NOTE — Patient Instructions (Signed)
[name] , Thank you for taking time to come for your Medicare Wellness Visit. I appreciate your ongoing commitment to your health goals. Please review the following plan we discussed and let me know if I can assist you in the future.   These are the goals we discussed: Goals     Exercise 3x per week (30 min per time)       This is a list of the screening recommended for you and due dates:  Health Maintenance  Topic Date Due   Flu Shot  11/18/2018   Eye exam for diabetics  02/24/2019   Hemoglobin A1C  02/27/2019   Complete foot exam   06/08/2019   Tetanus Vaccine  04/29/2027   Pneumonia vaccines  Completed    Exercise for Older Adults Staying physically active is important as you age. The four types of exercises that are best for older adults are endurance, strength, balance, and flexibility. Contact your health care provider before you start any exercise routine. Ask your health care provider what activities are safe for you. What are the risks? Risks associated with exercising include:  Overdoing it. This may lead to sore muscles or fatigue.  Falls.  Injuries.  Dehydration. How to do these exercises Endurance exercises Endurance (aerobic) exercises raise your breathing rate and heart rate. Increasing your endurance helps you to do everyday tasks and stay healthy. By improving the health of your body system that includes your heart, lungs, and blood vessels (circulatory system), you may also delay or prevent diseases such as heart disease, diabetes, and bone loss (osteoporosis). Types of endurance exercises include:  Sports.  Indoor activities, such as using gym equipment, doing water aerobics, or dancing.  Outdoor activities, such as biking or jogging.  Tasks around the house, such as gardening, yard work, and heavy household chores like cleaning.  Walking, such as hiking or walking around your neighborhood. When doing endurance exercises, make sure  you:  Are aware of your surroundings.  Use safety equipment as directed.  Dress in layers when exercising outdoors.  Drink plenty of water to stay well hydrated. Build up endurance slowly. Start with 10 minutes at a time, and gradually build up to doing 30 minutes at a time. Unless your health care provider gave you different instructions, aim to exercise for a total of 150 minutes a week. Spread out that time so you are working on endurance on 3 or more days a week. Strength exercises Lifting, pulling, or pushing weights helps to strengthen muscles. Having stronger muscles makes it easier to do everyday activities, such as getting up from a chair, climbing stairs, carrying groceries, and playing with grandchildren. Strength exercises include arm and leg exercises that may be done:  With weights.  Without weights (using your own body weight).  With a resistance band. When doing strength exercises:  Move smoothly and steadily. Do not suddenly thrust or jerk the weights, the resistance band, or your body.  Start with no weights or with light weights, and gradually add more weight over time. Eventually, aim to use weights that are hard or very hard for you to lift. This means that you are able to do 8 repetitions with the weight, and the last few repetitions are very challenging.  Lift or push weights into position for 3 seconds, hold the position for 1 second, and then take 3 seconds to return to your starting position.  Breathe out (exhale) during difficult movements, like lifting or pushing weights. Breathe  in (inhale) to relax your muscles before the next repetition.  Consider alternating arms or legs, especially when you first start strength exercises.  Expect some slight muscle soreness after each session. Do strength exercises on 2 or more days a week, for 30 minutes at a time. Avoid exercising the same muscle groups two days in a row. For example, if you work on your leg muscles  one day, work on your arm muscles the next day. When you can do two sets of 10-15 repetitions with a certain weight, increase the amount of weight. Balance Balance exercises can help to prevent falls. Balance exercises include:  Standing on one foot.  Heel-to-toe walk.  Balance walk.  Tai chi. Make sure you have something sturdy to hold onto while doing balance exercises, such as a sturdy chair. As your balance improves, challenge yourself by holding onto the chair with one hand instead of two, and then with no hands. Trying exercises with your eyes closed also challenges your balance, but be sure to have a sturdy surface (like a countertop) close by in case you need it. Do balance exercises as often as you want, or as often as directed by your health care provider. Strength exercises for the lower body also help to improve balance. Flexibility Flexibility exercises improve how far you can bend, straighten, move, or rotate parts of your body (range of motion). These exercises also help you to do everyday activities such as getting dressed or reaching for objects. Flexibility exercises include stretching different parts of the body, and they may be done in a standing or seated position or on the floor. When stretching, make sure you:  Keep a slight bend in your arms and legs. Avoid completely straightening ("locking") your joints.  Do not stretch so far that you feel pain. You should feel a mild stretching feeling. You may try stretching farther as you become more flexible over time.  Relax and breathe between stretches.  Hold onto something sturdy for balance as needed. Hold each stretch for 10-30 seconds. Repeat each stretch 3-5 times. General safety tips  Exercise in well-lit areas.  Do not hold your breath during exercises or stretches.  Warm up before exercising, and cool down after exercising. This can help prevent injury.  Drink plenty of water during exercise or any activity  that makes you sweat.  Use smooth, steady movements. Do not use sudden, jerking movements, especially when lifting weights or doing flexibility exercises.  If you are not sure if an exercise is safe for you, or you are not sure how to do an exercise, talk with your health care provider. This is especially important if you have had surgery on muscles, bones, or joints (orthopedic surgery). Where to find more information You can find more information about exercise for older adults from:  Your local health department, fitness center, or community center. These facilities may have programs for aging adults.  [employer] on Aging: http://kim-miller.com/  National Council on Aging: www.ncoa.org Summary  Staying physically active is important as you age.  Make sure to contact your health care provider before you start any exercise routine. Ask your health care provider what activities are safe for you.  Doing endurance, strength, balance, and flexibility exercises can help to delay or prevent certain diseases, such as heart disease, diabetes, and bone loss (osteoporosis). This information is not intended to replace advice given to you by your health care provider. Make sure you discuss any questions you have with  your health care provider. Document Released: 08/25/2016 Document Revised: 08/25/2016 Document Reviewed: 08/25/2016 Elsevier Interactive Patient Education  2019 [place] for Falls Each year, millions of people suffer serious injuries from falls. It is important to understand your risk for falling. Talk with your health care provider about your risk and what you can do to lower it. There are actions you can take at home to lower your risk. If you do have a serious fall, it is important to tell your health care provider. Falling once raises your risk for falling again. How can falls affect me? Serious injuries from falls are common. These  include:  Broken bones. Most hip fractures are caused by falls.  Traumatic brain injury (TBI). Falls are the most common cause of TBI. Fear of falling can also cause you to avoid activities and stay at home. This can make your muscles weaker and actually raise your risk for a fall. What can increase my risk? Serious injuries from a fall most often happen to people older than age 75. Children and young adults ages 69-29 are also at higher risk. The more risk factors you have for falling, the higher your risk. Risk factors include:  Weakness in the lower body.  Lack (deficiency) of vitamin D.  Weak bones (osteoporosis).  Being generally weak or confused due to long-term (chronic) illness.  Dizziness or balance problems.  Poor vision.  Having depression.  Medicine that causes dizziness or drowsiness. These can include medicines for your blood pressure, heart, anxiety, insomnia, or edema, as well as pain medicines and muscle relaxants.  Drinking alcohol.  Foot pain or improper footwear.  Working at a dangerous job.  Having had a fall in the past.  Tripping hazards at home, such as floor clutter or loose rugs, or poor lighting.  Having pets or clutter in your home. What actions can I take to lower my risk of falling?      Maintain physical fitness: ? Do strength and balance exercises. Consider taking a regular class to build strength and balance. Yoga and tai chi are good options. ? Have your eyes checked every year and your vision prescription updated as needed.  Remove all clutter from walkways and stairways, including extension cords.  Use a cordless phone.  Do not use throw rugs. Make sure all carpeting is taped or tacked down securely.  Use good lighting in all rooms. Keep a flashlight near your bed.  Make sure there is a clear path from your bed to the bathroom. Use night-lights.  Install grab bars for your tub, shower, and toilet. Use a bath mat in your tub or  shower.  Attach secure railings on both sides of your stairs.  Repair uneven or broken steps.  Use a cane or walker as directed by your health care provider.  Wear nonskid shoes. Do not wear high heels. Do not walk around the house in socks or slippers.  Avoid walking on icy or slippery surfaces. Walk on the grass instead of on icy or slick sidewalks. Where you can, use ice melt to get rid of ice on walkways. Questions to ask your health care provider  Can you help me evaluate my risk for a fall?  Do any of my medicines make me more likely to fall?  Should I take a vitamin D supplement?  What exercises can I do to improve my strength and balance?  Should I make an appointment to have my  vision checked?  Do I need a bone density test to check for osteoporosis?  Would it help to use a cane or a walker? Where to find more information  Centers for Disease Control and Prevention, STEADI: StoreMirror.com.cy  Community-Based Fall Prevention Programs: StoreMirror.com.cy  [employer] on Aging: ToneConnect.com.ee Contact a health care provider if:  You fall at home.  You are afraid of falling at home.  You feel weak, drowsy, or dizzy at home. Summary  People 79 and older are at high risk for falling. However, older people are not the only ones injured in falls. Children and young adults have a higher-than-normal risk, too.  Talk with your health care provider about your risks for falling and how to lower those risks.  Taking certain precautions at home can lower your risk for falling.  If you fall, always tell your health care provider. This information is not intended to replace advice given to you by your health care provider. Make sure you discuss any questions you have with your health care provider. Document Released: 11/17/2016 Document Revised: 11/17/2016 Document Reviewed: 11/17/2016 Elsevier Interactive Patient Education  2019 [employer].

## 2018-09-20 NOTE — Progress Notes (Signed)
MEDICARE ANNUAL WELLNESS VISIT  09/20/2018  Telephone Visit Disclaimer This Medicare AWV was conducted by telephone due to national recommendations for restrictions regarding the COVID-19 Pandemic (e.g. social distancing).  I verified, using two identifiers, that I am speaking with [name] [name] or their authorized healthcare agent. I discussed the limitations, risks, security, and privacy concerns of performing an evaluation and management service by telephone and the potential availability of an in-person appointment in the future. The patient expressed understanding and agreed to proceed.   Subjective:  [name] [name] is a 78 y.o. male patient of Chipper Herb, MD who had a Medicare Annual Wellness Visit today via telephone. Kindrick is Retired and lives with their spouse. he has 1 living child 2 decease children. he reports that he is socially active and does interact with friends/family regularly. he is minimally physically active and enjoys gardening.  Patient Care Team: Chipper Herb, MD as PCP - General (Family Medicine) Calvert Cantor, MD as Consulting Physician (Ophthalmology) Minus Breeding, MD as Consulting Physician (Cardiology) Crista Luria, MD as Consulting Physician (Dermatology) Chipper Herb, MD (Family Medicine)  Advanced Directives 09/20/2018 08/30/2018 08/26/2018 12/01/2015 11/04/2014 10/30/2013  Does Patient Have a Medical Advance Directive? Yes No No No No Patient would like information  Type of Advance Directive [place];Living will - - - - -  Does patient want to make changes to medical advance directive? No - Patient declined - - - - -  Copy of [place] in Chart? No - copy requested - - - - -  Would patient like information on creating a medical advance directive? No - Patient declined No - Patient declined No - Patient declined;No - Guardian declined No - patient declined information No - patient declined information Advance  directive packet given  Pre-existing out of facility DNR order (yellow form or pink MOST form) - - - - - No    Hospital Utilization Over the Past 12 Months: # of hospitalizations or ER visits: 1 # of surgeries: 1  Review of Systems    Patient reports that his overall health is worse compared to last year.  Patient Reported Readings (BP, Pulse, CBG, Weight, etc) none  Review of Systems: History obtained from chart review and the patient General ROS: negative  All other systems negative.  Pain Assessment Pain : No/denies pain Pain Score: 0-No pain Faces Pain Scale: No hurt  Faces Pain Scale: No hurt  Current Medications & Allergies (verified) Allergies as of 09/20/2018      Reactions   Actos [pioglitazone Hydrochloride] Swelling   Fluid retention   Lisinopril Rash   Invokana [canagliflozin] Rash   Niaspan [niacin Er] Other (See Comments)   Increased blood sugar   Ramipril Diarrhea      Medication List       Accurate as of September 20, 2018  3:54 PM. If you have any questions, ask your nurse or doctor.        acetaminophen 325 MG tablet Commonly known as:  TYLENOL Take 2 tablets (650 mg total) by mouth every 4 (four) hours as needed for mild pain or headache.   amiodarone 200 MG tablet Commonly known as:  PACERONE Take 1 tablet (200 mg total) by mouth daily.   amLODipine 5 MG tablet Commonly known as:  NORVASC Take 1 tablet (5 mg total) by mouth daily.   apixaban 5 MG Tabs tablet Commonly known as:  ELIQUIS Take 1 tablet (  5 mg total) by mouth 2 (two) times daily.   aspirin 81 MG tablet Take 81 mg by mouth daily.   aspirin EC 81 MG tablet Take 81 mg by mouth daily.   atorvastatin 80 MG tablet Commonly known as:  LIPITOR Take 1 tablet (80 mg total) by mouth daily at 6 PM.   clopidogrel 75 MG tablet Commonly known as:  PLAVIX Take 1 tablet (75 mg total) by mouth daily with breakfast.   fluticasone 50 MCG/ACT nasal spray Commonly known as:  FLONASE One  to 2 sprays each nostril at bedtime   fluticasone 50 MCG/ACT nasal spray Commonly known as:  FLONASE Place 2 sprays into both nostrils daily as needed for allergies.   glucose blood test strip Check BS TID and [id number].E11.9   Insulin Detemir 100 UNIT/ML Pen Commonly known as:  LEVEMIR Inject 20 Units into the skin 2 (two) times daily.   irbesartan 75 MG tablet Commonly known as:  AVAPRO Take 1 tablet (75 mg total) by mouth daily.   levothyroxine 75 MCG tablet Commonly known as:  SYNTHROID Take 1 tablet (75 mcg total) by mouth daily.   levothyroxine 75 MCG tablet Commonly known as:  SYNTHROID Take 75 mcg by mouth daily.   metFORMIN 500 MG tablet Commonly known as:  GLUCOPHAGE Take 1 tablet (500 mg total) by mouth daily with breakfast.   metoprolol tartrate 50 MG tablet Commonly known as:  LOPRESSOR Take 1 tablet (50 mg total) by mouth 2 (two) times daily.   QUEtiapine 25 MG tablet Commonly known as:  SEROQUEL Take 1 tablet (25 mg total) by mouth at bedtime.   Vitamin D3 125 MCG (5000 UT) Caps Take 1 capsule (5,000 Units total) by mouth once a week. Take 1 capsule three times per week       History (reviewed): Past Medical History:  Diagnosis Date   Basal cell carcinoma    left forehead, nose, neck   Cataract    Diabetes mellitus    Diabetes mellitus without complication ([place])    Diverticulitis    Erectile dysfunction    Gastroesophageal reflux disease with hiatal hernia    Goiter, nontoxic, multinodular    with macrocyst   Hiatal hernia    With reflex   Hyperlipidemia    Inguinal hernia    right side   Kidney stone 1980   Leg fracture    Left   Liver hemangioma    Nephrolithiasis    Vitamin D deficiency    Past Surgical History:  Procedure Laterality Date   BACK SURGERY  1984   CATARACT EXTRACTION Bilateral    CORONARY STENT INTERVENTION N/A 08/25/2018   Procedure: CORONARY STENT INTERVENTION;  Surgeon: Sherren Mocha, MD;   Location: [facility];  Service: Cardiovascular;  Laterality: N/A;   EYE SURGERY     LACERATION REPAIR  2008   Dr. Lenon Curt -to hand   LEFT HEART CATH AND CORONARY ANGIOGRAPHY N/A 08/25/2018   Procedure: LEFT HEART CATH AND CORONARY ANGIOGRAPHY;  Surgeon: Sherren Mocha, MD;  Location: [facility];  Service: Cardiovascular;  Laterality: N/A;   TIBIA FRACTURE SURGERY     left leg   Family History  Problem Relation Age of Onset   Heart attack Sister        Sudden death in her 42s.  No clear as to the cause   Heart disease Brother        Valvular   Heart disease Mother 56  stent, lived to be 52   Cancer Father        ?lung   Early death Brother 3       spinal menigitis   Diabetes Other    Social History   Socioeconomic History   Marital status: Married    Spouse name: Not on file   Number of children: 1   Years of education: Not on file   Highest education level: 11th grade  Occupational History   Occupation: Retired  Scientist, product/process development strain: Not hard at [employer] insecurity:    Worry: Never true    Inability: Never true   Transportation needs:    Medical: No    Non-medical: No  Tobacco Use   Smoking status: Former Smoker    Packs/day: 1.00    Years: 27.00    Pack years: 27.00    Types: Cigarettes    Last attempt to quit: 04/20/1983    Years since quitting: 35.4   Smokeless tobacco: Never Used   Tobacco comment: Quit 25 years ago  Substance and Sexual Activity   Alcohol use: No   Drug use: No   Sexual activity: Yes  Lifestyle   Physical activity:    Days per week: 0 days    Minutes per session: 0 min   Stress: Not at all  Relationships   Social connections:    Talks on phone: More than three times a week    Gets together: More than three times a week    Attends religious service: More than 4 times per year    Active member of club or organization: Yes    Attends meetings of clubs or organizations:  More than 4 times per year    Relationship status: Married  Other Topics Concern   Not on file  Social History Narrative   ** Merged History Encounter **       Married.  Lives with wife.  Grandson stays with them some.      Activities of Daily Living In your present state of health, do you have any difficulty performing the following activities: 09/20/2018 08/30/2018  Hearing? N N  Vision? N N  Difficulty concentrating or making decisions? N N  Walking or climbing stairs? N Y  Dressing or bathing? N Y  Doing errands, shopping? N Y  Conservation officer, nature and eating ? N -  Using the Toilet? N -  In the past six months, have you accidently leaked urine? N -  Do you have problems with loss of bowel control? N -  Managing your Medications? N -  Managing your Finances? N -  Housekeeping or managing your Housekeeping? N -  Some recent data might be hidden    Patient Literacy How often do you need to have someone help you when you read instructions, pamphlets, or other written materials from your doctor or pharmacy?: 1 - Never What is the last grade level you completed in school?: 11th Grade  Exercise Current Exercise Habits: The patient does not participate in regular exercise at present  Diet Patient reports consuming 3 meals a day and 2 snack(s) a day Patient reports that his primary diet is: Regular Patient reports that she does have regular access to food.   Depression Screen PHQ 2/9 Scores 09/20/2018 06/07/2018 01/25/2018 10/10/2017 09/14/2017 07/15/2017 04/28/2017  PHQ - 2 Score 0 0 0 0 1 1 0  PHQ- 9 Score - - - - - - -  Fall Risk Fall Risk  09/20/2018 06/07/2018 01/25/2018 10/10/2017 09/14/2017  Falls in the past year? 0 0 Yes Yes No  Number falls in past yr: 0 - 1 2 or more -  Injury with Fall? 0 - No No -  Follow up - - - - -     Objective:  [name] [name] seemed alert and oriented and he participated appropriately during our telephone visit.  Blood Pressure Weight BMI  BP  Readings from Last 3 Encounters:  09/07/18 114/60  08/30/18 (!) 106/58  06/07/18 123/77   Wt Readings from Last 3 Encounters:  08/30/18 153 lb 10.6 oz (69.7 kg)  08/24/18 169 lb 12.1 oz (77 kg)  06/07/18 163 lb (73.9 kg)   BMI Readings from Last 1 Encounters:  08/30/18 22.05 kg/m    *Unable to obtain current vital signs, weight, and BMI due to telephone visit type  Hearing/Vision   Pete did not seem to have difficulty with hearing/understanding during the telephone conversation  Reports that he has had a formal eye exam by an eye care professional within the past year  Reports that he has had a formal hearing evaluation within the past year *Unable to fully assess hearing and vision during telephone visit type  Cognitive Function: 6CIT Screen 09/20/2018  What Year? 0 points  What month? 0 points  What time? 0 points  Count back from 20 0 points  Months in reverse 0 points  Repeat phrase 0 points  Total Score 0    Normal Cognitive Function Screening: Yes (Normal:0-7, Significant for Dysfunction: >8)  Immunization & Health Maintenance Record Immunization History  Administered Date(s) Administered   Influenza Whole 01/17/2010   Influenza, High Dose Seasonal PF 01/31/2015, 02/18/2016, 02/16/2017, 01/25/2018   Influenza,inj,Quad PF,6+ Mos 02/06/2014   Influenza-Unspecified 02/17/2013   Pneumococcal Conjugate-13 04/02/2013   Pneumococcal Polysaccharide-23 02/18/2008   Td 12/19/2006, 02/08/2011, 04/28/2017   Tdap 02/08/2011   Zoster 10/28/2010    Health Maintenance  Topic Date Due   INFLUENZA VACCINE  11/18/2018   OPHTHALMOLOGY EXAM  02/24/2019   HEMOGLOBIN A1C  02/27/2019   FOOT EXAM  06/08/2019   TETANUS/TDAP  04/29/2027   PNA vac Low Risk Adult  Completed       Assessment  This is a routine wellness examination for [name] [name].  Health Maintenance: Due or Overdue There are no preventive care reminders to display for this  patient.  [name] [name] does not need a referral for Community Assistance: Care Management:   no Social Work:    no Prescription Assistance:  no Nutrition/Diabetes Education:  no   Plan:  Personalized Goals Goals Addressed            This Visit's Progress    Exercise 3x per week (30 min per time)        Personalized Health Maintenance & Screening Recommendations  Up to date  Lung Cancer Screening Recommended: no (Low Dose CT Chest recommended if Age 34-80 years, 30 pack-year currently smoking OR have quit w/in past 15 years) Hepatitis C Screening recommended: no HIV Screening recommended: no  Advanced Directives: Written information was not prepared per patient's request.  Referrals & Orders No orders of the defined types were placed in this encounter.   Follow-up Plan  Follow-up with Chipper Herb, MD as planned   I have personally reviewed and noted the following in the patients chart:    Medical and social history  Use of alcohol, tobacco or illicit drugs  Current medications and supplements  Functional ability and status  Nutritional status  Physical activity  Advanced directives  List of other physicians  Hospitalizations, surgeries, and ER visits in previous 12 months  Vitals  Screenings to include cognitive, depression, and falls  Referrals and appointments  In addition, I have reviewed and discussed with [name] [name] certain preventive protocols, quality metrics, and best practice recommendations. A written personalized care plan for preventive services as well as general preventive health recommendations is available and can be mailed to the patient at his request.      Wardell Heath  09/20/2018

## 2018-09-25 ENCOUNTER — Telehealth: Payer: Self-pay | Admitting: *Deleted

## 2018-09-25 ENCOUNTER — Other Ambulatory Visit: Payer: Self-pay | Admitting: *Deleted

## 2018-09-25 MED ORDER — IRBESARTAN 75 MG PO TABS: 75.0000 mg | ORAL_TABLET | Freq: Every day | ORAL | 0 refills | Status: DC

## 2018-09-25 MED ORDER — AMLODIPINE BESYLATE 5 MG PO TABS: 5.0000 mg | ORAL_TABLET | Freq: Every day | ORAL | 0 refills | Status: DC

## 2018-09-25 MED ORDER — CLOPIDOGREL BISULFATE 75 MG PO TABS: 75.0000 mg | ORAL_TABLET | Freq: Every day | ORAL | 0 refills | Status: DC

## 2018-09-25 MED ORDER — ATORVASTATIN CALCIUM 80 MG PO TABS: 80.0000 mg | ORAL_TABLET | Freq: Every day | ORAL | 0 refills | Status: DC

## 2018-09-25 MED ORDER — APIXABAN 5 MG PO TABS: 5.0000 mg | ORAL_TABLET | Freq: Two times a day (BID) | ORAL | 0 refills | Status: DC

## 2018-09-25 MED ORDER — METOPROLOL TARTRATE 50 MG PO TABS: 50.0000 mg | ORAL_TABLET | Freq: Two times a day (BID) | ORAL | 0 refills | Status: DC

## 2018-09-25 NOTE — Telephone Encounter (Signed)
Virtual Visit Pre-Appointment Phone Call  [name] [name], I am calling you today to discuss your upcoming appointment. We are currently trying to limit exposure to the virus that causes COVID-19 by seeing patients at home rather than in the office."  1. "What is the BEST phone number to call the day of the visit?" - include this in appointment notes  2. "Do you have or have access to (through a family member/friend) a smartphone with video capability that we can use for your visit?" a. If yes - list this number in appt notes as "cell" (if different from BEST phone #) and list the appointment type as a VIDEO visit in appointment notes b. If no - list the appointment type as a PHONE visit in appointment notes  3. Confirm consent - "In the setting of the current Covid19 crisis, you are scheduled for a (phone or video) visit with your provider on (date) at (time).  Just as we do with many in-office visits, in order for you to participate in this visit, we must obtain consent.  If you'd like, I can send this to your mychart (if signed up) or email for you to review.  Otherwise, I can obtain your verbal consent now.  All virtual visits are billed to your insurance company just like a normal visit would be.  By agreeing to a virtual visit, we'd like you to understand that the technology does not allow for your provider to perform an examination, and thus may limit your provider's ability to fully assess your condition. If your provider identifies any concerns that need to be evaluated in person, we will make arrangements to do so.  Finally, though the technology is pretty good, we cannot assure that it will always work on either your or our end, and in the setting of a video visit, we may have to convert it to a phone-only visit.  In either situation, we cannot ensure that we have a secure connection.  Are you willing to proceed?" STAFF: Did the patient verbally acknowledge consent to telehealth visit?  Document YES/NO here: yes  4. Advise patient to be prepared - "Two hours prior to your appointment, go ahead and check your blood pressure, pulse, oxygen saturation, and your weight (if you have the equipment to check those) and write them all down. When your visit starts, your provider will ask you for this information. If you have an Apple Watch or Kardia device, please plan to have heart rate information ready on the day of your appointment. Please have a pen and paper handy nearby the day of the visit as well."  5. Give patient instructions for MyChart download to smartphone OR Doximity/Doxy.me as below if video visit (depending on what platform provider is using)  6. Inform patient they will receive a phone call 15 minutes prior to their appointment time (may be from unknown caller ID) so they should be prepared to answer    TELEPHONE CALL NOTE  [name] [name] has been deemed a candidate for a follow-up tele-health visit to limit community exposure during the Covid-19 pandemic. I spoke with the patient via phone to ensure availability of phone/video source, confirm preferred email & phone number, and discuss instructions and expectations.  I reminded [name] [name] to be prepared with any vital sign and/or heart rhythm information that could potentially be obtained via home monitoring, at the time of his visit. I reminded [name] [name] to expect a phone call prior  to his visit.  [name] [name] 09/25/2018 11:33 AM   INSTRUCTIONS FOR DOWNLOADING THE MYCHART APP TO SMARTPHONE  - The patient must first make sure to have activated MyChart and know their login information - If Apple, go to [employer] and type in MyChart in the search bar and download the app. If Android, ask patient to go to [employer] and type in [employer] in the search bar and download the app. The app is free but as with any other app downloads, their phone may require them to verify saved payment information or  Apple/Android password.  - The patient will need to then log into the app with their MyChart username and password, and select [facility] as their healthcare provider to link the account. When it is time for your visit, go to the MyChart app, find appointments, and click Begin Video Visit. Be sure to Select Allow for your device to access the Microphone and Camera for your visit. You will then be connected, and your provider will be with you shortly.  **If they have any issues connecting, or need assistance please contact MyChart service desk [phone])**  **If using a computer, in order to ensure the best quality for their visit they will need to use either of the following Internet Browsers: [employer], or Google Chrome**  IF USING DOXIMITY or DOXY.ME - The patient will receive a link just prior to their visit by text.     FULL LENGTH CONSENT FOR TELE-HEALTH VISIT   I hereby voluntarily request, consent and authorize [facility] and its employed or contracted physicians, physician assistants, nurse practitioners or other licensed health care professionals (the Practitioner), to provide me with telemedicine health care services (the "Services") as deemed necessary by the treating Practitioner. I acknowledge and consent to receive the Services by the Practitioner via telemedicine. I understand that the telemedicine visit will involve communicating with the Practitioner through live audiovisual communication technology and the disclosure of certain medical information by electronic transmission. I acknowledge that I have been given the opportunity to request an in-person assessment or other available alternative prior to the telemedicine visit and am voluntarily participating in the telemedicine visit.  I understand that I have the right to withhold or withdraw my consent to the use of telemedicine in the course of my care at any time, without affecting my right to future care  or treatment, and that the Practitioner or I may terminate the telemedicine visit at any time. I understand that I have the right to inspect all information obtained and/or recorded in the course of the telemedicine visit and may receive copies of available information for a reasonable fee.  I understand that some of the potential risks of receiving the Services via telemedicine include:  Marland Kitchen Delay or interruption in medical evaluation due to technological equipment failure or disruption; . Information transmitted may not be sufficient (e.g. poor resolution of images) to allow for appropriate medical decision making by the Practitioner; and/or  . In rare instances, security protocols could fail, causing a breach of personal health information.  Furthermore, I acknowledge that it is my responsibility to provide information about my medical history, conditions and care that is complete and accurate to the best of my ability. I acknowledge that Practitioner's advice, recommendations, and/or decision may be based on factors not within their control, such as incomplete or inaccurate data provided by me or distortions of diagnostic images or specimens that may result from electronic transmissions. I  understand that the practice of medicine is not an exact science and that Practitioner makes no warranties or guarantees regarding treatment outcomes. I acknowledge that I will receive a copy of this consent concurrently upon execution via email to the email address I last provided but may also request a printed copy by calling the office of [facility].    I understand that my insurance will be billed for this visit.   I have read or had this consent read to me. . I understand the contents of this consent, which adequately explains the benefits and risks of the Services being provided via telemedicine.  . I have been provided ample opportunity to ask questions regarding this consent and the Services and have had  my questions answered to my satisfaction. . I give my informed consent for the services to be provided through the use of telemedicine in my medical care  By participating in this telemedicine visit I agree to the above.

## 2018-09-26 NOTE — Progress Notes (Signed)
Virtual Visit via Telephone Note   This visit type was conducted due to national recommendations for restrictions regarding the COVID-19 Pandemic (e.g. social distancing) in an effort to limit this patient's exposure and mitigate transmission in our community.  Due to his co-morbid illnesses, this patient is at least at moderate risk for complications without adequate follow up.  This format is felt to be most appropriate for this patient at this time.  The patient did not have access to video technology/had technical difficulties with video requiring transitioning to audio format only (telephone).  All issues noted in this document were discussed and addressed.  No physical exam could be performed with this format.  Please refer to the patient's chart for his  consent to telehealth for [facility].   Date:  09/27/2018   ID:  [name] [name], DOB [birth year], MRN [id number]  Patient Location: Home Provider Location: Home  PCP:  Chipper Herb, MD  Cardiologist:  Minus Breeding, MD  Electrophysiologist:  None   Evaluation Performed:  Follow-Up Visit  Chief Complaint:  CAD  History of Present Illness:    [name] [name] is a 78 y.o. male with cardiac arrest in April. Cardiac catheterization showed severe proximal LAD stenosis and he underwent successful stenting and was extubated 08/23/2018.  His hospital course complicated by atrial fibrillation with RVR maintained on aspirin Plavix as well as Eliquis and amiodarone.  He went to rehab after discharge.  I reviewed these records for this visit.    Since he got back again he is actually done pretty well.  He still has little weakness in his legs but he is walking down to the garden.  Sometimes uses a walker.  He completed rehab.  He is never had any chest discomfort, neck or arm discomfort.  He is never had any shortness of breath, PND or orthopnea.  He denies any palpitations, presyncope or syncope.  He is had no weight gain or edema.  The  patient does not have symptoms concerning for COVID-19 infection (fever, chills, cough, or new shortness of breath).    Past Medical History:  Diagnosis Date  . Basal cell carcinoma    left forehead, nose, neck  . Cataract   . Diabetes mellitus   . Diabetes mellitus without complication ([place])   . Diverticulitis   . Erectile dysfunction   . Gastroesophageal reflux disease with hiatal hernia   . Goiter, nontoxic, multinodular    with macrocyst  . Hiatal hernia    With reflex  . Hyperlipidemia   . Inguinal hernia    right side  . Kidney stone 1980  . Leg fracture    Left  . Liver hemangioma   . Nephrolithiasis   . Vitamin D deficiency    Past Surgical History:  Procedure Laterality Date  . BACK SURGERY  1984  . CATARACT EXTRACTION Bilateral   . CORONARY STENT INTERVENTION N/A 08/25/2018   Procedure: CORONARY STENT INTERVENTION;  Surgeon: Sherren Mocha, MD;  Location: [facility];  Service: Cardiovascular;  Laterality: N/A;  . EYE SURGERY    . LACERATION REPAIR  2008   Dr. Lenon Curt -to hand  . LEFT HEART CATH AND CORONARY ANGIOGRAPHY N/A 08/25/2018   Procedure: LEFT HEART CATH AND CORONARY ANGIOGRAPHY;  Surgeon: Sherren Mocha, MD;  Location: [facility];  Service: Cardiovascular;  Laterality: N/A;  . TIBIA FRACTURE SURGERY     left leg     Current Meds  Medication Sig  .  acetaminophen (TYLENOL) 325 MG tablet Take 2 tablets (650 mg total) by mouth every 4 (four) hours as needed for mild pain or headache.  Marland Kitchen amiodarone (PACERONE) 200 MG tablet Take 1 tablet (200 mg total) by mouth daily.  Marland Kitchen amLODipine (NORVASC) 5 MG tablet Take 1 tablet (5 mg total) by mouth daily.  Marland Kitchen apixaban (ELIQUIS) 5 MG TABS tablet Take 1 tablet (5 mg total) by mouth 2 (two) times daily.  Marland Kitchen aspirin 81 MG tablet Take 81 mg by mouth daily.    Marland Kitchen atorvastatin (LIPITOR) 80 MG tablet Take 1 tablet (80 mg total) by mouth daily at 6 PM.  . Cholecalciferol (VITAMIN D3 MAXIMUM STRENGTH) 125 MCG (5000  UT) capsule Take 5,000 Units by mouth. Take one tablet three times weekly  . clopidogrel (PLAVIX) 75 MG tablet Take 1 tablet (75 mg total) by mouth daily with breakfast.  . fluticasone (FLONASE) 50 MCG/ACT nasal spray Place 2 sprays into both nostrils daily as needed for allergies.  . Insulin Detemir (LEVEMIR) 100 UNIT/ML Pen Inject 20 Units into the skin 2 (two) times daily. (Patient taking differently: Inject 50 Units into the skin daily. )  . irbesartan (AVAPRO) 75 MG tablet Take 1 tablet (75 mg total) by mouth daily.  Marland Kitchen levothyroxine (SYNTHROID) 75 MCG tablet Take 75 mcg by mouth daily.  Marland Kitchen levothyroxine (SYNTHROID, LEVOTHROID) 75 MCG tablet Take 1 tablet (75 mcg total) by mouth daily.  . metFORMIN (GLUCOPHAGE) 500 MG tablet 500 mg. Take 1/2 tablet by mouth twice daily  . metoprolol tartrate (LOPRESSOR) 50 MG tablet Take 1 tablet (50 mg total) by mouth 2 (two) times daily.     Allergies:   Actos [pioglitazone hydrochloride]; Lisinopril; Invokana [canagliflozin]; Niaspan [niacin er]; and Ramipril   Social History   Tobacco Use  . Smoking status: Former Smoker    Packs/day: 1.00    Years: 27.00    Pack years: 27.00    Types: Cigarettes    Last attempt to quit: 04/20/1983    Years since quitting: 35.4  . Smokeless tobacco: Never Used  . Tobacco comment: Quit 25 years ago  Substance Use Topics  . Alcohol use: No  . Drug use: No     Family Hx: The patient's family history includes Cancer in his father; Diabetes in an other family member; Early death (age of onset: 43) in his brother; Heart attack in his sister; Heart disease in his brother; Heart disease (age of onset: 40) in his mother.  ROS:   Please see the history of present illness.    As stated in the HPI and negative for all other systems.   Prior CV studies:   The following studies were reviewed today:  08/25/18  Diagnostic cath Dominance: Right    Intervention       Labs/Other Tests and Data Reviewed:    EKG:   08/28/2018 normal sinus rhythm, rate 93, axis within normal limits, intervals within normal limits, no acute ST-T wave changes.  Recent Labs: 08/26/2018: TSH 3.346 08/30/2018: Magnesium 2.2 08/31/2018: ALT 44; BUN 20; Creatinine, Ser 0.99; Hemoglobin 9.9; Platelets 403; Potassium 4.0; Sodium 141   Recent Lipid Panel Lab Results  Component Value Date/Time   CHOL 113 06/07/2018 09:35 AM   CHOL 128 10/16/2012 03:45 PM   TRIG 113 08/21/2018 03:06 AM   TRIG 52 03/18/2016 11:26 AM   TRIG 102 10/16/2012 03:45 PM   HDL 41 06/07/2018 09:35 AM   HDL 53 03/18/2016 11:26 AM   HDL 45  10/16/2012 03:45 PM   CHOLHDL 2.8 06/07/2018 09:35 AM   LDLCALC 60 06/07/2018 09:35 AM   LDLCALC 66 12/13/2013 08:42 AM   LDLCALC 63 10/16/2012 03:45 PM    Wt Readings from Last 3 Encounters:  09/27/18 154 lb (69.9 kg)  08/30/18 153 lb 10.6 oz (69.7 kg)  08/24/18 169 lb 12.1 oz (77 kg)     Objective:    Vital Signs:  BP 121/68   Ht 5\' 10"  (1.778 m)   Wt 154 lb (69.9 kg)   BMI 22.10 kg/m    VITAL SIGNS:  reviewed  ASSESSMENT & PLAN:    Out of Hospital Ventricular Fibrillation Arrest:    He seems to be doing quite well.  He had a normal ejection fraction.  No change in therapy other than below.   CAD: He can stop his aspirin.  He will continue his Plavix and Eliquis. .  Atrial Fibrillation w/ RVR:     He has not had any symptomatic paroxysms.  However, because of previous atrial fibrillation he will need to continue the Eliquis and for now the Plavix.  I will check a CBC and basic metabolic profile.  HTN:    Blood pressures well controlled.  He will continue the meds as listed.  T2DM:   A1C was 8.7.  He actually had his metformin cut down and I am not sure why this happened I think it happened during his hospitalization or rehab.  He is going to see Dr. Laurance Flatten in a few days and I told him he needs to get his sugar better controlled but I will defer to his primary team.  Acute Diastolic HF:   He seems  to be euvolemic by history.  Not having any new shortness of breath.  No change in therapy.  Dyslipidemia: We will continue meds as listed.  LDL was 60 with an HDL of 41.  COVID-19 Education: The signs and symptoms of COVID-19 were discussed with the patient and how to seek care for testing (follow up with PCP or arrange E-visit).  The importance of social distancing was discussed today.  Time:   Today, I have spent 30 minutes with the patient with telehealth technology discussing the above problems.     Medication Adjustments/Labs and Tests Ordered: Current medicines are reviewed at length with the patient today.  Concerns regarding medicines are outlined above.   Tests Ordered: No orders of the defined types were placed in this encounter.   Medication Changes: No orders of the defined types were placed in this encounter.   Disposition:  Follow up with me in the office in 2 months  Signed, Minus Breeding, MD  09/27/2018 10:42 AM    [place]

## 2018-09-27 ENCOUNTER — Other Ambulatory Visit: Payer: Self-pay | Admitting: *Deleted

## 2018-09-27 ENCOUNTER — Other Ambulatory Visit: Payer: Self-pay

## 2018-09-27 ENCOUNTER — Encounter: Payer: Self-pay | Admitting: Cardiology

## 2018-09-27 ENCOUNTER — Telehealth (INDEPENDENT_AMBULATORY_CARE_PROVIDER_SITE_OTHER): Payer: Medicare PPO | Admitting: Cardiology

## 2018-09-27 VITALS — BP 121/68 | Ht 70.0 in | Wt 154.0 lb

## 2018-09-27 DIAGNOSIS — I10 Essential (primary) hypertension: Secondary | ICD-10-CM | POA: Diagnosis not present

## 2018-09-27 DIAGNOSIS — Z79899 Other long term (current) drug therapy: Secondary | ICD-10-CM

## 2018-09-27 DIAGNOSIS — I482 Chronic atrial fibrillation, unspecified: Secondary | ICD-10-CM

## 2018-09-27 DIAGNOSIS — Z7189 Other specified counseling: Secondary | ICD-10-CM

## 2018-09-27 MED ORDER — AMIODARONE HCL 200 MG PO TABS: 200.0000 mg | ORAL_TABLET | Freq: Every day | ORAL | 0 refills | Status: DC

## 2018-09-27 MED ORDER — INSULIN DETEMIR 100 UNIT/ML FLEXPEN: 20.0000 [IU] | PEN_INJECTOR | Freq: Two times a day (BID) | SUBCUTANEOUS | 0 refills | Status: DC

## 2018-09-27 NOTE — Patient Instructions (Signed)
Medication Instructions:  Discontinue your Aspirin.  Continue all other medications as listed.  If you need a refill on your cardiac medications before your next appointment, please call your pharmacy.   Lab work: Please have blood work on June 26 when you see Dr Laurance Flatten (CBC,BMP) If you have labs (blood work) drawn today and your tests are completely normal, you will receive your results only by: Marland Kitchen MyChart Message (if you have MyChart) OR . A paper copy in the mail If you have any lab test that is abnormal or we need to change your treatment, we will call you to review the results.  Follow-Up: Follow up with Dr Percival Spanish in 2 months in [place].  Thank you for choosing [facility]!!

## 2018-10-04 ENCOUNTER — Other Ambulatory Visit: Payer: Self-pay | Admitting: *Deleted

## 2018-10-04 MED ORDER — INSULIN DETEMIR 100 UNIT/ML FLEXPEN: 50.0000 [IU] | PEN_INJECTOR | Freq: Every day | SUBCUTANEOUS | 3 refills | Status: DC

## 2018-10-04 MED ORDER — AMIODARONE HCL 200 MG PO TABS: 200.0000 mg | ORAL_TABLET | Freq: Every day | ORAL | 3 refills | Status: DC

## 2018-10-13 ENCOUNTER — Other Ambulatory Visit: Payer: Self-pay

## 2018-10-13 ENCOUNTER — Encounter: Payer: Self-pay | Admitting: Family Medicine

## 2018-10-13 ENCOUNTER — Ambulatory Visit (INDEPENDENT_AMBULATORY_CARE_PROVIDER_SITE_OTHER): Payer: Medicare PPO | Admitting: Family Medicine

## 2018-10-13 DIAGNOSIS — E782 Mixed hyperlipidemia: Secondary | ICD-10-CM

## 2018-10-13 DIAGNOSIS — Z794 Long term (current) use of insulin: Secondary | ICD-10-CM

## 2018-10-13 DIAGNOSIS — I10 Essential (primary) hypertension: Secondary | ICD-10-CM

## 2018-10-13 DIAGNOSIS — E034 Atrophy of thyroid (acquired): Secondary | ICD-10-CM | POA: Diagnosis not present

## 2018-10-13 DIAGNOSIS — N400 Enlarged prostate without lower urinary tract symptoms: Secondary | ICD-10-CM

## 2018-10-13 DIAGNOSIS — E559 Vitamin D deficiency, unspecified: Secondary | ICD-10-CM

## 2018-10-13 DIAGNOSIS — Z9861 Coronary angioplasty status: Secondary | ICD-10-CM

## 2018-10-13 DIAGNOSIS — K449 Diaphragmatic hernia without obstruction or gangrene: Secondary | ICD-10-CM

## 2018-10-13 DIAGNOSIS — Z8249 Family history of ischemic heart disease and other diseases of the circulatory system: Secondary | ICD-10-CM

## 2018-10-13 DIAGNOSIS — I4891 Unspecified atrial fibrillation: Secondary | ICD-10-CM

## 2018-10-13 DIAGNOSIS — E119 Type 2 diabetes mellitus without complications: Secondary | ICD-10-CM

## 2018-10-13 DIAGNOSIS — Z1211 Encounter for screening for malignant neoplasm of colon: Secondary | ICD-10-CM

## 2018-10-13 DIAGNOSIS — E08311 Diabetes mellitus due to underlying condition with unspecified diabetic retinopathy with macular edema: Secondary | ICD-10-CM

## 2018-10-13 DIAGNOSIS — I2510 Atherosclerotic heart disease of native coronary artery without angina pectoris: Secondary | ICD-10-CM

## 2018-10-13 NOTE — Patient Instructions (Signed)
Continue to follow diet closely and exercise as tolerated Drink plenty of water and fluids Follow-up with Dr. Percival Spanish as planned Follow-up with PCP in 4 months Get eye exams yearly Continue to practice good respiratory and hand hygiene Check blood sugars regularly Come to the office for fasting blood work and arrange this with my nurse and wear protective equipment when you come in for the blood draw

## 2018-10-13 NOTE — Progress Notes (Signed)
Virtual Visit Via telephone Note I connected with@ on 10/13/18 by telephone and verified that I am speaking with the correct person or authorized healthcare agent using two identifiers. [name] [name] is currently located at home and there are no unauthorized people in close proximity. I completed this visit while in a private location in my home .  This visit type was conducted due to national recommendations for restrictions regarding the COVID-19 Pandemic (e.g. social distancing).  This format is felt to be most appropriate for this patient at this time.  All issues noted in this document were discussed and addressed.  No physical exam was performed.    I discussed the limitations, risks, security and privacy concerns of performing an evaluation and management service by telephone and the availability of in person appointments. I also discussed with the patient that there may be a patient responsible charge related to this service. The patient expressed understanding and agreed to proceed.   Date:  10/13/2018    ID:  [name]      07/12/1940        [id number]   Patient Care Team Patient Care Team: Chipper Herb, MD as PCP - General (Family Medicine) Minus Breeding, MD as PCP - Cardiology (Cardiology) Calvert Cantor, MD as Consulting Physician (Ophthalmology) Minus Breeding, MD as Consulting Physician (Cardiology) Crista Luria, MD as Consulting Physician (Dermatology) Chipper Herb, MD (Family Medicine)  Reason for Visit: Primary Care Follow-up     History of Present Illness & Review of Systems:     [name] [name] is a 78 y.o. year old male primary care patient that presents today for a telehealth visit.  The patient is doing well overall and is regaining his strength following his hospitalization and cardiac arrest.  He has had a tele-visit with the cardiologist and plans to see him face-to-face in a couple of months.  He had a cardiac arrest with stent placement.   He had no chest pain at the time of the arrest.  Fortunately he is doing very well.  Today he denies any chest pain pressure tightness or shortness of breath other than weakness in the legs which he says is improving following his hospitalization.  He only complains of constipation but no blood in the stool and this is being relieved somewhat by taking Senokot or a generic version of that.  He is passing his water well.  Review of systems as stated, otherwise negative.  The patient does not have symptoms concerning for COVID-19 infection (fever, chills, cough, or new shortness of breath).      Current Medications (Verified) Allergies as of 10/13/2018      Reactions   Actos [pioglitazone Hydrochloride] Swelling   Fluid retention   Lisinopril Rash   Invokana [canagliflozin] Rash   Niaspan [niacin Er] Other (See Comments)   Increased blood sugar   Ramipril Diarrhea      Medication List       Accurate as of October 13, 2018  7:44 AM. If you have any questions, ask your nurse or doctor.        acetaminophen 325 MG tablet Commonly known as: TYLENOL Take 2 tablets (650 mg total) by mouth every 4 (four) hours as needed for mild pain or headache.   amiodarone 200 MG tablet Commonly known as: PACERONE Take 1 tablet (200 mg total) by mouth daily.   amLODipine 5 MG tablet Commonly known as: NORVASC Take 1 tablet (5 mg  total) by mouth daily.   apixaban 5 MG Tabs tablet Commonly known as: ELIQUIS Take 1 tablet (5 mg total) by mouth 2 (two) times daily.   atorvastatin 80 MG tablet Commonly known as: LIPITOR Take 1 tablet (80 mg total) by mouth daily at 6 PM.   clopidogrel 75 MG tablet Commonly known as: PLAVIX Take 1 tablet (75 mg total) by mouth daily with breakfast.   fluticasone 50 MCG/ACT nasal spray Commonly known as: FLONASE Place 2 sprays into both nostrils daily as needed for allergies.   glucose blood test strip Check BS TID and [id number]   Insulin Detemir 100  UNIT/ML Pen Commonly known as: LEVEMIR Inject 50 Units into the skin daily.   irbesartan 75 MG tablet Commonly known as: AVAPRO Take 1 tablet (75 mg total) by mouth daily.   levothyroxine 75 MCG tablet Commonly known as: SYNTHROID Take 1 tablet (75 mcg total) by mouth daily.   levothyroxine 75 MCG tablet Commonly known as: SYNTHROID Take 75 mcg by mouth daily.   metFORMIN 500 MG tablet Commonly known as: GLUCOPHAGE 500 mg. Take 1/2 tablet by mouth twice daily   metoprolol tartrate 50 MG tablet Commonly known as: LOPRESSOR Take 1 tablet (50 mg total) by mouth 2 (two) times daily.   Vitamin D3 Maximum Strength 125 MCG (5000 UT) capsule Generic drug: Cholecalciferol Take 5,000 Units by mouth. Take one tablet three times weekly           Allergies (Verified)    Actos [pioglitazone hydrochloride], Lisinopril, Invokana [canagliflozin], Niaspan [niacin er], and Ramipril  Past Medical History Past Medical History:  Diagnosis Date  . Basal cell carcinoma    left forehead, nose, neck  . CAD (coronary artery disease)   . Cardiac arrest ([place])   . Cataract   . Diabetes mellitus   . Diabetes mellitus without complication ([place])   . Diverticulitis   . Erectile dysfunction   . Gastroesophageal reflux disease with hiatal hernia   . Goiter, nontoxic, multinodular    with macrocyst  . Hiatal hernia    With reflex  . Hyperlipidemia   . Inguinal hernia    right side  . Kidney stone 1980  . Leg fracture    Left  . Liver hemangioma   . Nephrolithiasis   . Vitamin D deficiency      Past Surgical History:  Procedure Laterality Date  . BACK SURGERY  1984  . CATARACT EXTRACTION Bilateral   . CORONARY STENT INTERVENTION N/A 08/25/2018   Procedure: CORONARY STENT INTERVENTION;  Surgeon: Sherren Mocha, MD;  Location: [facility];  Service: Cardiovascular;  Laterality: N/A;  . EYE SURGERY    . LACERATION REPAIR  2008   Dr. Lenon Curt -to hand  . LEFT HEART CATH AND CORONARY  ANGIOGRAPHY N/A 08/25/2018   Procedure: LEFT HEART CATH AND CORONARY ANGIOGRAPHY;  Surgeon: Sherren Mocha, MD;  Location: [facility];  Service: Cardiovascular;  Laterality: N/A;  . TIBIA FRACTURE SURGERY     left leg    Social History   Socioeconomic History  . Marital status: Married    Spouse name: Not on file  . Number of children: 1  . Years of education: Not on file  . Highest education level: 11th grade  Occupational History  . Occupation: Retired  Scientific laboratory technician  . Financial resource strain: Not hard at all  . Food insecurity    Worry: Never true    Inability: Never true  . Transportation needs  Medical: No    Non-medical: No  Tobacco Use  . Smoking status: Former Smoker    Packs/day: 1.00    Years: 27.00    Pack years: 27.00    Types: Cigarettes    Quit date: 04/20/1983    Years since quitting: 35.5  . Smokeless tobacco: Never Used  . Tobacco comment: Quit 25 years ago  Substance and Sexual Activity  . Alcohol use: No  . Drug use: No  . Sexual activity: Yes  Lifestyle  . Physical activity    Days per week: 0 days    Minutes per session: 0 min  . Stress: Not at all  Relationships  . Social connections    Talks on phone: More than three times a week    Gets together: More than three times a week    Attends religious service: More than 4 times per year    Active member of club or organization: Yes    Attends meetings of clubs or organizations: More than 4 times per year    Relationship status: Married  Other Topics Concern  . Not on file  Social History Narrative   ** Merged History Encounter **       Married.  Lives with wife.  Grandson stays with them some.       Family History  Problem Relation Age of Onset  . Heart attack Sister        Sudden death in her 39s.  No clear as to the cause  . Heart disease Brother        Valvular  . Heart disease Mother 85       stent, lived to be 39  . Cancer Father        ?lung  . Early death Brother 3        spinal menigitis  . Diabetes Other       Labs/Other Tests and Data Reviewed:    Wt Readings from Last 3 Encounters:  09/27/18 154 lb (69.9 kg)  08/30/18 153 lb 10.6 oz (69.7 kg)  08/24/18 169 lb 12.1 oz (77 kg)   Temp Readings from Last 3 Encounters:  09/07/18 97.6 F (36.4 C)  08/30/18 97.7 F (36.5 C) (Oral)  06/07/18 (!) 96.9 F (36.1 C) (Oral)   BP Readings from Last 3 Encounters:  09/27/18 121/68  09/07/18 114/60  08/30/18 (!) 106/58   Pulse Readings from Last 3 Encounters:  09/07/18 (!) 57  08/30/18 65  06/07/18 74     Lab Results  Component Value Date   HGBA1C 8.7 (H) 08/27/2018   HGBA1C 9.0 (H) 06/07/2018   HGBA1C 8.1 (H) 01/25/2018   Lab Results  Component Value Date   MICROALBUR neg 12/13/2013   LDLCALC 60 06/07/2018   CREATININE 0.99 08/31/2018       Chemistry      Component Value Date/Time   NA 141 08/31/2018 0503   NA 137 06/07/2018 0935   K 4.0 08/31/2018 0503   CL 107 08/31/2018 0503   CO2 25 08/31/2018 0503   BUN 20 08/31/2018 0503   BUN 13 06/07/2018 0935   CREATININE 0.99 08/31/2018 0503   CREATININE 0.78 10/16/2012 1545      Component Value Date/Time   CALCIUM 8.9 08/31/2018 0503   ALKPHOS 108 08/31/2018 0503   AST 30 08/31/2018 0503   ALT 44 08/31/2018 0503   BILITOT 0.9 08/31/2018 0503   BILITOT 0.4 06/07/2018 0935         OBSERVATIONS/  OBJECTIVE:     The patient is alert and responding to questions asked of him.  His fasting blood sugar was 157.  His blood pressure was 121/68.  His weight is 153 and stable since he has been at home but lower than it was prior to his hospitalization.  His pulse rate he says is 108 he will check this more regularly and let us know about this.  He has no signs of any infection swelling or redness in his feet.  Does have a history of skin cancer and I reminded him to keep up with his dermatology visits.  Physical exam deferred due to nature of telephonic visit.  ASSESSMENT & PLAN     Time:   Today, I have spent 29 minutes with the patient via telephone discussing the above including Covid precautions.     Visit Diagnoses: 1. CAD S/P percutaneous coronary angioplasty -Need to follow-up with cardiology as planned  2. Essential hypertension -Check blood pressures regularly and record these and bring these to your next visit and to see the cardiologist  3. Atrial fibrillation with rapid ventricular response (HCC) -Continue to follow-up with cardiology  4. Diaphragmatic hernia without obstruction and without gangrene -No complaints today with hernia  5. Hypothyroidism due to acquired atrophy of thyroid -Continue thyroid replacement pending results of lab work  6. Benign prostatic hyperplasia, unspecified whether lower urinary tract symptoms present -No complaints with voiding today  7. Hyperlipidemia -Check lipid liver panel and continue with atorvastatin and therapeutic lifestyle changes including diet and exercise  8. Vitamin D deficiency -Continue with vitamin D replacement pending results of lab work  9. Family history of heart disease -Follow-up with cardiology and practice aggressive therapeutic lifestyle changes  10. 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  He is currently taking metformin 500 half pill twice daily but may need to go back up to 500 twice daily.  11. CVD (cardiovascular disease) -Follow-up with cardiology  12.  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  Percival Spanish as planned Follow-up with PCP in 4 months Get eye exams yearly Continue to practice good respiratory and hand hygiene Check blood sugars regularly Come to the office for fasting blood work and arrange this with my nurse and wear protective equipment when you come in for the blood draw    The above assessment and management plan was discussed with the patient. The patient verbalized understanding of and has agreed to the management plan. Patient is aware to call the clinic if symptoms persist or worsen. Patient is aware when to return to the clinic for a follow-up visit. Patient educated on when it is appropriate to go to the emergency department.    Chipper Herb, MD [address], [place], [place] [address] Ph [phone]   Arrie Senate MD

## 2018-10-13 NOTE — Addendum Note (Signed)
Addended by: Zannie Cove on: 10/13/2018 11:20 AM   Modules accepted: Orders

## 2018-10-25 ENCOUNTER — Other Ambulatory Visit: Payer: Self-pay

## 2018-10-25 ENCOUNTER — Encounter: Payer: Self-pay | Admitting: Student

## 2018-10-25 ENCOUNTER — Ambulatory Visit (INDEPENDENT_AMBULATORY_CARE_PROVIDER_SITE_OTHER): Payer: Medicare PPO | Admitting: Student

## 2018-10-25 ENCOUNTER — Other Ambulatory Visit: Payer: Medicare PPO

## 2018-10-25 VITALS — BP 132/62 | HR 63 | Temp 98.5°F | Ht 71.0 in | Wt 159.0 lb

## 2018-10-25 DIAGNOSIS — E119 Type 2 diabetes mellitus without complications: Secondary | ICD-10-CM

## 2018-10-25 DIAGNOSIS — K449 Diaphragmatic hernia without obstruction or gangrene: Secondary | ICD-10-CM

## 2018-10-25 DIAGNOSIS — E1169 Type 2 diabetes mellitus with other specified complication: Secondary | ICD-10-CM | POA: Diagnosis not present

## 2018-10-25 DIAGNOSIS — Z9861 Coronary angioplasty status: Secondary | ICD-10-CM | POA: Diagnosis not present

## 2018-10-25 DIAGNOSIS — Z794 Long term (current) use of insulin: Secondary | ICD-10-CM

## 2018-10-25 DIAGNOSIS — I10 Essential (primary) hypertension: Secondary | ICD-10-CM

## 2018-10-25 DIAGNOSIS — I4891 Unspecified atrial fibrillation: Secondary | ICD-10-CM

## 2018-10-25 DIAGNOSIS — E785 Hyperlipidemia, unspecified: Secondary | ICD-10-CM

## 2018-10-25 DIAGNOSIS — I480 Paroxysmal atrial fibrillation: Secondary | ICD-10-CM

## 2018-10-25 DIAGNOSIS — N400 Enlarged prostate without lower urinary tract symptoms: Secondary | ICD-10-CM | POA: Diagnosis not present

## 2018-10-25 DIAGNOSIS — Z8674 Personal history of sudden cardiac arrest: Secondary | ICD-10-CM | POA: Diagnosis not present

## 2018-10-25 DIAGNOSIS — Z1211 Encounter for screening for malignant neoplasm of colon: Secondary | ICD-10-CM

## 2018-10-25 DIAGNOSIS — I2510 Atherosclerotic heart disease of native coronary artery without angina pectoris: Secondary | ICD-10-CM

## 2018-10-25 DIAGNOSIS — E782 Mixed hyperlipidemia: Secondary | ICD-10-CM

## 2018-10-25 DIAGNOSIS — E08311 Diabetes mellitus due to underlying condition with unspecified diabetic retinopathy with macular edema: Secondary | ICD-10-CM

## 2018-10-25 DIAGNOSIS — Z8249 Family history of ischemic heart disease and other diseases of the circulatory system: Secondary | ICD-10-CM

## 2018-10-25 DIAGNOSIS — E034 Atrophy of thyroid (acquired): Secondary | ICD-10-CM | POA: Diagnosis not present

## 2018-10-25 DIAGNOSIS — E559 Vitamin D deficiency, unspecified: Secondary | ICD-10-CM

## 2018-10-25 LAB — BAYER DCA HB A1C WAIVED: HB A1C (BAYER DCA - WAIVED): 8.5 % — ABNORMAL HIGH (ref <7.0)

## 2018-10-25 MED ORDER — METOPROLOL TARTRATE 50 MG PO TABS: 25.0000 mg | ORAL_TABLET | Freq: Two times a day (BID) | ORAL | 0 refills | Status: DC

## 2018-10-25 NOTE — Patient Instructions (Addendum)
Medication Instructions:  Your physician recommends that you continue on your current medications as directed. Please refer to the Current Medication list given to you today.   Labwork: None  Procedures/Testing: None  Follow-Up: Keep apt on 12/13/18 Dr Warren Lacy 3 pm at [facility] office  Any Additional Special Instructions Will Be Listed Below (If Applicable).     If you need a refill on your cardiac medications before your next appointment, please call your pharmacy.

## 2018-10-25 NOTE — Progress Notes (Signed)
Cardiology Office Note    Date:  10/25/2018   ID:  [name] [name], DOB 1940-07-08, MRN [id number]  PCP:  Claretta Fraise, MD  Cardiologist: Minus Breeding, MD    Chief Complaint  Patient presents with  . Follow-up    3 week visit    History of Present Illness:    [name] [name] is a 78 y.o. male with past medical history of VF arrest (occurring in 07/2018), CAD (s/p DES to Prox-LAD and DES to LCx in 08/2018), PAF, HTN, HLD, Type 2 DM, and hypothyroidism who presents for a 3-week follow-up visit.   He was admitted to [facility] on 08/17/2018 after having an out of hospital Vfib Arrest and having ROSC after 11 minutes of CRP, 1 Epi, and 2 shocks. Was followed by Critical Care and underwent rewarming. Was also treated for Hafina Alvei PNA. He did experience atrial fibrillation with RVR and required IV Amiodarone. Once he was extubated and following commands, he eventually underwent a cardiac catheterization by Dr. Burt Knack on 08/25/2018 which showed 80% Prox-LCx stenosis, 95% Prox-LAD, and 50% Distal RCA stenosis. Underwent successful PCI to proximal-LAD with Synergy DES and PCI to LCx with DES placement. He was started on ASA (for 30 days), Plavix (for at least 6 months), and Eliquis for anticoagulation given his recent atrial fibrillation. Was discharged to Inpatient Rehab on 5/13 and eventually discharged home on 5/21.  He had a phone telehealth visit with Dr. Percival Spanish on 09/27/2018 and reported is weakness had continued to improve. Denied any recent chest pain or dyspnea since hospital discharge. ASA was discontinued at that time and he was continued on Plavix and Eliquis.  In talking with the patient today, he reports overall doing well since his recent hospitalization. He denies any chest pain, dyspnea on exertion, orthopnea, PND, or lower extremity edema. Says his energy level has continued to improve.   He reports good compliance with his current medication regimen. Does experience easy  bruising with Eliquis but no melena or hematochezia. Did stop ASA after his last visit with Dr. Percival Spanish while remaining on Plavix and Eliquis as instructed. Reports BP has overall been well-controlled when checked at home, at 132/62 during today's visit.    Past Medical History:  Diagnosis Date  . Basal cell carcinoma    left forehead, nose, neck  . CAD (coronary artery disease)    a. s/p DES to Prox-LAD and DES to LCx in 08/2018  . Cardiac arrest ([place])   . Cataract   . Diabetes mellitus   . Diabetes mellitus without complication ([place])   . Diverticulitis   . Erectile dysfunction   . Gastroesophageal reflux disease with hiatal hernia   . Goiter, nontoxic, multinodular    with macrocyst  . Hiatal hernia    With reflex  . Hyperlipidemia   . Inguinal hernia    right side  . Kidney stone 1980  . Leg fracture    Left  . Liver hemangioma   . Nephrolithiasis   . Vitamin D deficiency     Past Surgical History:  Procedure Laterality Date  . BACK SURGERY  1984  . CATARACT EXTRACTION Bilateral   . CORONARY STENT INTERVENTION N/A 08/25/2018   Procedure: CORONARY STENT INTERVENTION;  Surgeon: Sherren Mocha, MD;  Location: [facility];  Service: Cardiovascular;  Laterality: N/A;  . EYE SURGERY    . LACERATION REPAIR  2008   Dr. Lenon Curt -to hand  . LEFT HEART CATH AND CORONARY ANGIOGRAPHY  N/A 08/25/2018   Procedure: LEFT HEART CATH AND CORONARY ANGIOGRAPHY;  Surgeon: Sherren Mocha, MD;  Location: [facility];  Service: Cardiovascular;  Laterality: N/A;  . TIBIA FRACTURE SURGERY     left leg    Current Medications: Outpatient Medications Prior to Visit  Medication Sig Dispense Refill  . acetaminophen (TYLENOL) 325 MG tablet Take 2 tablets (650 mg total) by mouth every 4 (four) hours as needed for mild pain or headache.    Marland Kitchen amiodarone (PACERONE) 200 MG tablet Take 1 tablet (200 mg total) by mouth daily. 90 tablet 3  . amLODipine (NORVASC) 5 MG tablet Take 1 tablet (5 mg  total) by mouth daily. 90 tablet 0  . apixaban (ELIQUIS) 5 MG TABS tablet Take 1 tablet (5 mg total) by mouth 2 (two) times daily. 180 tablet 0  . atorvastatin (LIPITOR) 80 MG tablet Take 1 tablet (80 mg total) by mouth daily at 6 PM. 90 tablet 0  . Cholecalciferol (VITAMIN D3 MAXIMUM STRENGTH) 125 MCG (5000 UT) capsule Take 5,000 Units by mouth. Take one tablet three times weekly    . clopidogrel (PLAVIX) 75 MG tablet Take 1 tablet (75 mg total) by mouth daily with breakfast. 90 tablet 0  . fluticasone (FLONASE) 50 MCG/ACT nasal spray Place 2 sprays into both nostrils daily as needed for allergies.    . Garlic 253 MG TABS Take by mouth.    Marland Kitchen glucose blood test strip Check BS TID and PRN.DX.E11.9 100 each 11  . Insulin Detemir (LEVEMIR) 100 UNIT/ML Pen Inject 50 Units into the skin daily. 45 mL 3  . irbesartan (AVAPRO) 75 MG tablet Take 1 tablet (75 mg total) by mouth daily. 90 tablet 0  . levothyroxine (SYNTHROID) 75 MCG tablet Take 75 mcg by mouth daily.    Marland Kitchen levothyroxine (SYNTHROID, LEVOTHROID) 75 MCG tablet Take 1 tablet (75 mcg total) by mouth daily. 90 tablet 3  . metFORMIN (GLUCOPHAGE) 500 MG tablet 500 mg. Take 1/2 tablet by mouth twice daily    . metoprolol tartrate (LOPRESSOR) 50 MG tablet Take 1 tablet (50 mg total) by mouth 2 (two) times daily. 180 tablet 0   No facility-administered medications prior to visit.      Allergies:   Actos [pioglitazone hydrochloride], Lisinopril, Invokana [canagliflozin], Niaspan [niacin er], and Ramipril   Social History   Socioeconomic History  . Marital status: Married    Spouse name: Not on file  . Number of children: 1  . Years of education: Not on file  . Highest education level: 11th grade  Occupational History  . Occupation: Retired  Scientific laboratory technician  . Financial resource strain: Not hard at all  . Food insecurity    Worry: Never true    Inability: Never true  . Transportation needs    Medical: No    Non-medical: No  Tobacco Use  .  Smoking status: Former Smoker    Packs/day: 1.00    Years: 27.00    Pack years: 27.00    Types: Cigarettes    Quit date: 04/20/1983    Years since quitting: 35.5  . Smokeless tobacco: Never Used  . Tobacco comment: Quit 25 years ago  Substance and Sexual Activity  . Alcohol use: No  . Drug use: No  . Sexual activity: Yes  Lifestyle  . Physical activity    Days per week: 0 days    Minutes per session: 0 min  . Stress: Not at all  Relationships  . Social  connections    Talks on phone: More than three times a week    Gets together: More than three times a week    Attends religious service: More than 4 times per year    Active member of club or organization: Yes    Attends meetings of clubs or organizations: More than 4 times per year    Relationship status: Married  Other Topics Concern  . Not on file  Social History Narrative   ** Merged History Encounter **       Married.  Lives with wife.  Grandson stays with them some.       Family History:  The patient's family history includes Cancer in his father; Diabetes in an other family member; Early death (age of onset: 61) in his brother; Heart attack in his sister; Heart disease in his brother; Heart disease (age of onset: 6) in his mother.   Review of Systems:   Please see the history of present illness.     General:  No chills, fever, night sweats or weight changes. Positive for fatigue (improving).  Cardiovascular:  No chest pain, dyspnea on exertion, edema, orthopnea, palpitations, paroxysmal nocturnal dyspnea. Dermatological: No rash, lesions/masses Respiratory: No cough, dyspnea Urologic: No hematuria, dysuria Abdominal:   No nausea, vomiting, diarrhea, bright red blood per rectum, melena, or hematemesis Neurologic:  No visual changes, wkns, changes in mental status. All other systems reviewed and are otherwise negative except as noted above.   Physical Exam:    VS:  BP 132/62   Pulse 63   Temp 98.5 F (36.9 C)    Ht 5\' 11"  (1.803 m)   Wt 159 lb (72.1 kg)   SpO2 98%   BMI 22.18 kg/m    General: Well developed, well nourished,male appearing in no acute distress. Head: Normocephalic, atraumatic, sclera non-icteric, no xanthomas, nares are without discharge.  Neck: No carotid bruits. JVD not elevated.  Lungs: Respirations regular and unlabored, without wheezes or rales.  Heart: Regular rate and rhythm. No S3 or S4.  No murmur, no rubs, or gallops appreciated. Abdomen: Soft, non-tender, non-distended with normoactive bowel sounds. No hepatomegaly. No rebound/guarding. No obvious abdominal masses. Msk:  Strength and tone appear normal for age. No joint deformities or effusions. Extremities: No clubbing or cyanosis. No lower extremity edema.  Distal pedal pulses are 2+ bilaterally. Radial site stable with no ecchymosis or evidence of a hematoma.  Neuro: Alert and oriented X 3. Moves all extremities spontaneously. No focal deficits noted. Psych:  Responds to questions appropriately with a normal affect. Skin: No rashes or lesions noted  Wt Readings from Last 3 Encounters:  10/25/18 159 lb (72.1 kg)  09/27/18 154 lb (69.9 kg)  08/30/18 153 lb 10.6 oz (69.7 kg)     Studies/Labs Reviewed:   EKG:  EKG is ordered today.  The ekg ordered today demonstrates NSR, HR 63, with 1st degree AV Block. Anterior infarct pattern.   Recent Labs: 08/26/2018: TSH 3.346 08/30/2018: Magnesium 2.2 08/31/2018: ALT 44; BUN 20; Creatinine, Ser 0.99; Hemoglobin 9.9; Platelets 403; Potassium 4.0; Sodium 141   Lipid Panel    Component Value Date/Time   CHOL 113 06/07/2018 0935   CHOL 128 10/16/2012 1545   TRIG 113 08/21/2018 0306   TRIG 52 03/18/2016 1126   TRIG 102 10/16/2012 1545   HDL 41 06/07/2018 0935   HDL 53 03/18/2016 1126   HDL 45 10/16/2012 1545   CHOLHDL 2.8 06/07/2018 0935   LDLCALC 60 06/07/2018 0935  [place] 66 12/13/2013 0842   LDLCALC 63 10/16/2012 1545    Additional studies/ records that were  reviewed today include:   Echocardiogram: 08/19/2018 IMPRESSIONS    1. The left ventricle has normal systolic function, with an ejection fraction of 55-60%. The cavity size was normal. There is mildly increased left ventricular wall thickness. Left ventricular diastolic Doppler parameters are consistent with impaired  relaxation.  2. The right ventricle has normal systolic function. The cavity was normal. There is no increase in right ventricular wall thickness.  3. The mitral valve is abnormal. Mild thickening of the mitral valve leaflet.  4. The aortic valve is tricuspid. Mild thickening of the aortic valve. Mild calcification of the aortic valve.  Cardiac Catheterization: 08/25/2018  Prox Cx to Mid Cx lesion is 80% stenosed.  Prox LAD lesion is 95% stenosed.  A drug-eluting stent was successfully placed using a STENT SYNERGY DES 3X20.  Post intervention, there is a 0% residual stenosis.  A drug-eluting stent was successfully placed using a STENT SYNERGY DES 2.25X12.  Post intervention, there is a 0% residual stenosis.  Dist RCA lesion is 50% stenosed.   1.  Severe proximal LAD stenosis, treated successfully with PCI using a 3.0 x 20 mm Synergy DES 2.  Severe mid circumflex stenosis, treated successfully with a 2.25 x 12 mm Synergy DES 3.  Moderate distal RCA stenosis, nonobstructive 4.  Normal LV function by echo  Recommendations: Patient with paroxysmal atrial fibrillation.  Recommend start oral anticoagulation with apixaban tomorrow morning.  Aspirin x30 days only.  Clopidogrel times at least 6 months.  Cangrelor is used in the cardiac catheterization lab for IV antiplatelet therapy.  The infusion will be continued for a total of 2 hours, then the patient will be loaded with clopidogrel 600 mg per protocol.  Assessment:    1. Coronary artery disease involving native coronary artery of native heart without angina pectoris   2. History of cardiac arrest   3. PAF  (paroxysmal atrial fibrillation) ([place])   4. Essential hypertension   5. Hyperlipidemia LDL goal <70   6. Type 2 diabetes mellitus with other specified complication, with long-term current use of insulin ([place])      Plan:   In order of problems listed above:  1. Out of Hospital VF Arrest - occurring in 07/2018 as outlined above. He has overall progressed significantly well with no focal neurological deficits. His energy level continues to improve. He is very grateful for the care administered by the bystander when his initial event occurred along with EMS and the entire hospital team.  2. CAD - s/p DES to Prox-LAD and DES to LCx in 08/2018. Reviewed catheterization report in detail with the patient today. He denies any recent chest pain or dyspnea on exertion. Will make sure referral for cardiac rehab has been entered.  - continue Plavix, statin, and BB therapy. ASA discontinued last month given the concurrent need for Eliquis.   3. Paroxysmal Atrial Fibrillation - he denies any recent palpitations and is maintaining NSR by examination and EKG today.  - continue Amiodarone 200mg  daily (can hopefully discontinue in the future once further out from his acute event and if no recurrence) and Lopressor.  - he denies any evidence of active bleeding. Remains on Eliquis 5mg  BID for anticoagulation.   4. HTN - he reports BP has overall been well-controlled when checked at home, at 132/62 during today's visit. Continue current regimen with Amlodipine 5mg  daily, Irbesartan 75mg  daily, and Lopressor  25mg  BID (previously on 50mg  BID but experienced dizziness with this).   5. HLD - FLP in 05/2018 showed total cholesterol of 113, HDL 41, and LDL 60. Started on Atorvastatin 80mg  daily during admission. Repeat FLP and LFT's checked today by his PCP with results pending.   6. Type 2 DM - followed by PCP. Hgb A1c in 08/2018 was elevated to 8.7. Rechecked today by his PCP with results pending. Remains on  Metformin and Levemir.    Medication Adjustments/Labs and Tests Ordered: Current medicines are reviewed at length with the patient today.  Concerns regarding medicines are outlined above.  Medication changes, Labs and Tests ordered today are listed in the Patient Instructions below. Patient Instructions  Medication Instructions:  Your physician recommends that you continue on your current medications as directed. Please refer to the Current Medication list given to you today.   Labwork: None  Procedures/Testing: None  Follow-Up: Keep apt on 12/13/18 Dr. Percival Spanish 3 pm at [facility] office  Any Additional Special Instructions Will Be Listed Below (If Applicable).  If you need a refill on your cardiac medications before your next appointment, please call your pharmacy.    Signed, Erma Heritage, PA-C  10/25/2018 5:18 PM    [facility] [address] [place], [place] [address] Phone: [phone] Fax: [phone]

## 2018-10-26 LAB — LIPID PANEL
Chol/HDL Ratio: 2 ratio (ref 0.0–5.0)
Cholesterol, Total: 78 mg/dL — ABNORMAL LOW (ref 100–199)
HDL: 39 mg/dL — ABNORMAL LOW (ref >39)
LDL Calculated: 23 mg/dL (ref 0–99)
Triglycerides: 78 mg/dL (ref 0–149)
VLDL Cholesterol Cal: 16 mg/dL (ref 5–40)

## 2018-10-26 LAB — THYROID PANEL WITH TSH
Free Thyroxine Index: 2.8 (ref 1.2–4.9)
T3 Uptake Ratio: 30 % (ref 24–39)
T4, Total: 9.4 ug/dL (ref 4.5–12.0)
TSH: 2.93 u[IU]/mL (ref 0.450–4.500)

## 2018-10-26 LAB — CBC WITH DIFFERENTIAL/PLATELET
Basophils Absolute: 0.1 10*3/uL (ref 0.0–0.2)
Basos: 1 %
EOS (ABSOLUTE): 0.2 10*3/uL (ref 0.0–0.4)
Eos: 3 %
Hematocrit: 40.6 % (ref 37.5–51.0)
Hemoglobin: 13.3 g/dL (ref 13.0–17.7)
Immature Grans (Abs): 0 10*3/uL (ref 0.0–0.1)
Immature Granulocytes: 0 %
Lymphocytes Absolute: 2.1 10*3/uL (ref 0.7–3.1)
Lymphs: 28 %
MCH: 27.3 pg (ref 26.6–33.0)
MCHC: 32.8 g/dL (ref 31.5–35.7)
MCV: 83 fL (ref 79–97)
Monocytes Absolute: 0.6 10*3/uL (ref 0.1–0.9)
Monocytes: 8 %
Neutrophils Absolute: 4.5 10*3/uL (ref 1.4–7.0)
Neutrophils: 60 %
Platelets: 209 10*3/uL (ref 150–450)
RBC: 4.88 x10E6/uL (ref 4.14–5.80)
RDW: 13.8 % (ref 11.6–15.4)
WBC: 7.5 10*3/uL (ref 3.4–10.8)

## 2018-10-26 LAB — VITAMIN D 25 HYDROXY (VIT D DEFICIENCY, FRACTURES): Vit D, 25-Hydroxy: 46.6 ng/mL (ref 30.0–100.0)

## 2018-10-26 LAB — BMP8+EGFR
BUN/Creatinine Ratio: 13 (ref 10–24)
BUN: 12 mg/dL (ref 8–27)
CO2: 22 mmol/L (ref 20–29)
Calcium: 9.7 mg/dL (ref 8.6–10.2)
Chloride: 101 mmol/L (ref 96–106)
Creatinine, Ser: 0.91 mg/dL (ref 0.76–1.27)
GFR calc Af Amer: 93 mL/min/{1.73_m2} (ref >59)
GFR calc non Af Amer: 80 mL/min/{1.73_m2} (ref >59)
Glucose: 238 mg/dL — ABNORMAL HIGH (ref 65–99)
Potassium: 4.5 mmol/L (ref 3.5–5.2)
Sodium: 139 mmol/L (ref 134–144)

## 2018-10-26 LAB — HEPATIC FUNCTION PANEL
ALT: 72 IU/L — ABNORMAL HIGH (ref 0–44)
AST: 28 IU/L (ref 0–40)
Albumin: 4.3 g/dL (ref 3.7–4.7)
Alkaline Phosphatase: 105 IU/L (ref 39–117)
Bilirubin Total: 0.3 mg/dL (ref 0.0–1.2)
Bilirubin, Direct: 0.1 mg/dL (ref 0.00–0.40)
Total Protein: 6.5 g/dL (ref 6.0–8.5)

## 2018-10-26 LAB — FECAL OCCULT BLOOD, IMMUNOCHEMICAL: Fecal Occult Bld: NEGATIVE

## 2018-11-08 ENCOUNTER — Other Ambulatory Visit: Payer: Self-pay | Admitting: *Deleted

## 2018-11-10 ENCOUNTER — Telehealth: Payer: Self-pay | Admitting: *Deleted

## 2018-11-10 NOTE — Telephone Encounter (Signed)
FYI VM from [place], NP w/ [facility] She has increased pt's metformin back to 1000 mg BID If you are not in appropriate please call pt if you want to change

## 2018-12-12 NOTE — Progress Notes (Signed)
Cardiology Office Note   Date:  12/13/2018   ID:  [name] [name], DOB 28-Mar-1941, MRN [id number]  PCP:  Claretta Fraise, MD  Cardiologist:   Minus Breeding, MD   Chief Complaint  Patient presents with  . Coronary Artery Disease      History of Present Illness: [name] [name] is a 78 y.o. male who presents for follow up of multiple cardiovascular risk factors.  I saw him in Feb 2016 for evaluation of this same thing.  He has diabetes that is not well controlled.  He did have a POET (Plain Old Exercise Treadmill) in 2012 that was negative for ischemia but there was some brief atrial fib post test.  In April 2020. Cardiac catheterization showed severe proximal LAD stenosis and he underwent successful stenting and was extubated 08/23/2018. His hospital course complicated by atrial fibrillation with RVR maintained on aspirin Plavix as well as Eliquis and amiodarone.  He went to rehab after discharge  Since he was last seen he is done well.  He has been doing yard work. The patient denies any new symptoms such as chest discomfort, neck or arm discomfort. There has been no new shortness of breath, PND or orthopnea. There have been no reported palpitations, presyncope or syncope.     Past Medical History:  Diagnosis Date  . Basal cell carcinoma    left forehead, nose, neck  . CAD (coronary artery disease)    a. s/p DES to Prox-LAD and DES to LCx in 08/2018  . Cardiac arrest ([place])   . Cataract   . Diabetes mellitus   . Diabetes mellitus without complication ([place])   . Diverticulitis   . Erectile dysfunction   . Gastroesophageal reflux disease with hiatal hernia   . Goiter, nontoxic, multinodular    with macrocyst  . Hiatal hernia    With reflex  . Hyperlipidemia   . Inguinal hernia    right side  . Kidney stone 1980  . Leg fracture    Left  . Liver hemangioma   . Nephrolithiasis   . Vitamin D deficiency     Past Surgical History:  Procedure Laterality Date  . BACK SURGERY   1984  . CATARACT EXTRACTION Bilateral   . CORONARY STENT INTERVENTION N/A 08/25/2018   Procedure: CORONARY STENT INTERVENTION;  Surgeon: Sherren Mocha, MD;  Location: [facility];  Service: Cardiovascular;  Laterality: N/A;  . EYE SURGERY    . LACERATION REPAIR  2008   Dr. Lenon Curt -to hand  . LEFT HEART CATH AND CORONARY ANGIOGRAPHY N/A 08/25/2018   Procedure: LEFT HEART CATH AND CORONARY ANGIOGRAPHY;  Surgeon: Sherren Mocha, MD;  Location: [facility];  Service: Cardiovascular;  Laterality: N/A;  . TIBIA FRACTURE SURGERY     left leg     Current Outpatient Medications  Medication Sig Dispense Refill  . acetaminophen (TYLENOL) 325 MG tablet Take 2 tablets (650 mg total) by mouth every 4 (four) hours as needed for mild pain or headache.    Marland Kitchen amiodarone (PACERONE) 200 MG tablet Take 1 tablet (200 mg total) by mouth daily. 90 tablet 3  . amLODipine (NORVASC) 5 MG tablet Take 1 tablet (5 mg total) by mouth daily. 90 tablet 3  . apixaban (ELIQUIS) 5 MG TABS tablet Take 1 tablet (5 mg total) by mouth 2 (two) times daily. 180 tablet 3  . atorvastatin (LIPITOR) 80 MG tablet Take 1 tablet (80 mg total) by mouth daily at 6 PM.  90 tablet 3  . Cholecalciferol (VITAMIN D3 MAXIMUM STRENGTH) 125 MCG (5000 UT) capsule Take 5,000 Units by mouth. Take one tablet three times weekly    . clopidogrel (PLAVIX) 75 MG tablet Take 75 mg by mouth daily.    . clopidogrel (PLAVIX) 75 MG tablet Take 1 tablet (75 mg total) by mouth daily with breakfast. 90 tablet 3  . fluticasone (FLONASE) 50 MCG/ACT nasal spray Place 2 sprays into both nostrils daily as needed for allergies.    . Garlic 123XX123 MG TABS Take by mouth.    . Insulin Detemir (LEVEMIR) 100 UNIT/ML Pen Inject 50 Units into the skin daily. 45 mL 3  . irbesartan (AVAPRO) 75 MG tablet Take 1 tablet (75 mg total) by mouth daily. 90 tablet 3  . metFORMIN (GLUCOPHAGE) 1000 MG tablet Take 1 tablet (1,000 mg total) by mouth 2 (two) times daily with a  meal. 180 tablet 0  . metoprolol tartrate (LOPRESSOR) 50 MG tablet Take 0.5 tablets (25 mg total) by mouth 2 (two) times daily. 90 tablet 3  . glucose blood test strip Check BS TID and PRN.DX.E11.9 100 each 11  . levothyroxine (SYNTHROID) 75 MCG tablet Take 1 tablet (75 mcg total) by mouth daily. 90 tablet 3   No current facility-administered medications for this visit.     Allergies:   Actos [pioglitazone hydrochloride], Lisinopril, Invokana [canagliflozin], Niaspan [niacin er], and Ramipril    ROS:  Please see the history of present illness.   Otherwise, review of systems are positive for none.   All other systems are reviewed and negative.    PHYSICAL EXAM: VS:  BP 120/62   Pulse 64   Ht 5\' 10"  (1.778 m)   Wt 162 lb (73.5 kg)   BMI 23.24 kg/m  , BMI Body mass index is 23.24 kg/m. GENERAL:  Well appearing NECK:  No jugular venous distention, waveform within normal limits, carotid upstroke brisk and symmetric, no bruits, no thyromegaly LUNGS:  Clear to auscultation bilaterally CHEST:  Unremarkable HEART:  PMI not displaced or sustained,S1 and S2 within normal limits, no S3, no S4, no clicks, no rubs, no murmurs ABD:  Flat, positive bowel sounds normal in frequency in pitch, no bruits, no rebound, no guarding, no midline pulsatile mass, no hepatomegaly, no splenomegaly EXT:  2 plus pulses throughout, no edema, no cyanosis no clubbing   EKG:  EKG is not ordered today.   Recent Labs: 08/30/2018: Magnesium 2.2 10/25/2018: ALT 72; BUN 12; Creatinine, Ser 0.91; Hemoglobin 13.3; Platelets 209; Potassium 4.5; Sodium 139; TSH 2.930    Lipid Panel    Component Value Date/Time   CHOL 78 (L) 10/25/2018 0803   CHOL 128 10/16/2012 1545   TRIG 78 10/25/2018 0803   TRIG 52 03/18/2016 1126   TRIG 102 10/16/2012 1545   HDL 39 (L) 10/25/2018 0803   HDL 53 03/18/2016 1126   HDL 45 10/16/2012 1545   CHOLHDL 2.0 10/25/2018 0803   LDLCALC 23 10/25/2018 0803   LDLCALC 66 12/13/2013 0842    LDLCALC 63 10/16/2012 1545      Wt Readings from Last 3 Encounters:  12/13/18 162 lb (73.5 kg)  10/25/18 159 lb (72.1 kg)  09/27/18 154 lb (69.9 kg)      Other studies Reviewed: Additional studies/ records that were reviewed today include:  Labs Review of the above records demonstrates:  Please see elsewhere in the note.     ASSESSMENT AND PLAN:  Out of Hospital VF Arrest He is  done well from cardiovascular standpoint.  No further changes.  CAD  The patient has no new sypmtoms.  No further cardiovascular testing is indicated.  We will continue with aggressive risk reduction and meds as listed.  Paroxysmal Atrial Fibrillation He has had no further paroxysms of this.  I will likely stop his amiodarone and I see him back at the next appointment but for now we will continue.  We will continue his anticoagulation.  HTN Blood pressures controlled.  We will continue the meds as listed.  HLD LDL earlier this year was excellent.  We will continue the meds as listed.  Type 2 DM A1c was increase at 8.7 but he just had his metformin increased.  I will defer further management.  Current medicines are reviewed at length with the patient today.  The patient does not have concerns regarding medicines.  The following changes have been made:  no change  Labs/ tests ordered today include:   No orders of the defined types were placed in this encounter.    Disposition:   FU with me as needed after the POET (Plain Old Exercise Treadmill)    Signed, Minus Breeding, MD  12/13/2018 4:10 PM    [facility]

## 2018-12-13 ENCOUNTER — Encounter: Payer: Self-pay | Admitting: Cardiology

## 2018-12-13 ENCOUNTER — Other Ambulatory Visit: Payer: Self-pay

## 2018-12-13 ENCOUNTER — Ambulatory Visit: Payer: Medicare PPO | Admitting: Cardiology

## 2018-12-13 VITALS — BP 120/62 | HR 64 | Ht 70.0 in | Wt 162.0 lb

## 2018-12-13 DIAGNOSIS — I2510 Atherosclerotic heart disease of native coronary artery without angina pectoris: Secondary | ICD-10-CM | POA: Diagnosis not present

## 2018-12-13 DIAGNOSIS — I10 Essential (primary) hypertension: Secondary | ICD-10-CM | POA: Diagnosis not present

## 2018-12-13 DIAGNOSIS — E785 Hyperlipidemia, unspecified: Secondary | ICD-10-CM

## 2018-12-13 DIAGNOSIS — Z794 Long term (current) use of insulin: Secondary | ICD-10-CM | POA: Diagnosis not present

## 2018-12-13 DIAGNOSIS — E1169 Type 2 diabetes mellitus with other specified complication: Secondary | ICD-10-CM | POA: Diagnosis not present

## 2018-12-13 DIAGNOSIS — Z8674 Personal history of sudden cardiac arrest: Secondary | ICD-10-CM | POA: Diagnosis not present

## 2018-12-13 DIAGNOSIS — I480 Paroxysmal atrial fibrillation: Secondary | ICD-10-CM | POA: Diagnosis not present

## 2018-12-13 MED ORDER — LEVOTHYROXINE SODIUM 75 MCG PO TABS: 75.0000 ug | ORAL_TABLET | Freq: Every day | ORAL | 3 refills | Status: DC

## 2018-12-13 MED ORDER — METFORMIN HCL 1000 MG PO TABS: 1000.0000 mg | ORAL_TABLET | Freq: Two times a day (BID) | ORAL | 0 refills | Status: DC

## 2018-12-13 MED ORDER — ATORVASTATIN CALCIUM 80 MG PO TABS: 80.0000 mg | ORAL_TABLET | Freq: Every day | ORAL | 3 refills | Status: DC

## 2018-12-13 MED ORDER — METOPROLOL TARTRATE 50 MG PO TABS: 25.0000 mg | ORAL_TABLET | Freq: Two times a day (BID) | ORAL | 3 refills | Status: DC

## 2018-12-13 MED ORDER — APIXABAN 5 MG PO TABS: 5.0000 mg | ORAL_TABLET | Freq: Two times a day (BID) | ORAL | 3 refills | Status: DC

## 2018-12-13 MED ORDER — IRBESARTAN 75 MG PO TABS: 75.0000 mg | ORAL_TABLET | Freq: Every day | ORAL | 3 refills | Status: DC

## 2018-12-13 MED ORDER — AMLODIPINE BESYLATE 5 MG PO TABS: 5.0000 mg | ORAL_TABLET | Freq: Every day | ORAL | 3 refills | Status: DC

## 2018-12-13 MED ORDER — CLOPIDOGREL BISULFATE 75 MG PO TABS: 75.0000 mg | ORAL_TABLET | Freq: Every day | ORAL | 3 refills | Status: DC

## 2018-12-13 NOTE — Patient Instructions (Signed)
Medication Instructions:  The current medical regimen is effective;  continue present plan and medications.  If you need a refill on your cardiac medications before your next appointment, please call your pharmacy.   Follow-Up: Follow up in 4 months with Dr. Percival Spanish.  You will receive a letter in the mail 2 months before you are due.  Please call us when you receive this letter to schedule your follow up appointment.  Thank you for choosing [facility]!!

## 2018-12-20 ENCOUNTER — Other Ambulatory Visit: Payer: Self-pay | Admitting: Family Medicine

## 2019-02-19 NOTE — Progress Notes (Signed)
Referral to cardiology

## 2019-02-20 ENCOUNTER — Encounter: Payer: Self-pay | Admitting: Family Medicine

## 2019-02-20 ENCOUNTER — Other Ambulatory Visit: Payer: Self-pay

## 2019-02-20 ENCOUNTER — Ambulatory Visit (INDEPENDENT_AMBULATORY_CARE_PROVIDER_SITE_OTHER): Payer: Medicare PPO | Admitting: Family Medicine

## 2019-02-20 VITALS — BP 158/74 | HR 55 | Temp 96.6°F | Resp 20 | Ht 70.0 in | Wt 165.0 lb

## 2019-02-20 DIAGNOSIS — E119 Type 2 diabetes mellitus without complications: Secondary | ICD-10-CM | POA: Diagnosis not present

## 2019-02-20 DIAGNOSIS — E034 Atrophy of thyroid (acquired): Secondary | ICD-10-CM

## 2019-02-20 DIAGNOSIS — E782 Mixed hyperlipidemia: Secondary | ICD-10-CM

## 2019-02-20 DIAGNOSIS — Z23 Encounter for immunization: Secondary | ICD-10-CM

## 2019-02-20 DIAGNOSIS — I10 Essential (primary) hypertension: Secondary | ICD-10-CM

## 2019-02-20 DIAGNOSIS — I4891 Unspecified atrial fibrillation: Secondary | ICD-10-CM

## 2019-02-20 DIAGNOSIS — Z794 Long term (current) use of insulin: Secondary | ICD-10-CM | POA: Diagnosis not present

## 2019-02-20 LAB — BAYER DCA HB A1C WAIVED: HB A1C (BAYER DCA - WAIVED): 8.7 % — ABNORMAL HIGH (ref <7.0)

## 2019-02-20 MED ORDER — DULOXETINE HCL 30 MG PO CPEP: 30.0000 mg | ORAL_CAPSULE | Freq: Every day | ORAL | 0 refills | Status: DC

## 2019-02-20 MED ORDER — INSULIN DETEMIR 100 UNIT/ML FLEXPEN: 60.0000 [IU] | PEN_INJECTOR | Freq: Every day | SUBCUTANEOUS | 3 refills | Status: DC

## 2019-02-20 NOTE — Progress Notes (Signed)
Subjective:  Patient ID: [name] [name], male    DOB: 1940-12-13  Age: 77 y.o. MRN: [id number]  CC: Medical Management of Chronic Issues (3-4 mo (moore pt)), Diabetes, Hypothyroidism, Hypertension, and Hyperlipidemia   HPI [name] [name] presents forFollow-up of diabetes. Patient checks blood sugar at home.   170  fasting and 200 postprandial Patient denies symptoms such as polyuria, polydipsia, excessive hunger, nausea No significant hypoglycemic spells noted. Medications reviewed. Pt reports taking them regularly without complication/adverse reaction being reported today.  Checking feet daily.    follow-up on  thyroid. The patient has a history of hypothyroidism for many years. It has been stable recently. Pt. denies any change in  voice, loss of hair, heat or cold intolerance. Energy level has been adequate to good. Patient denies constipation and diarrhea. No myxedema. Medication is as noted below. Verified that pt is taking it daily on an empty stomach. Well tolerated.  presents for  follow-up of hypertension. Patient has no history of headache chest pain or shortness of breath or recent cough. Patient also denies symptoms of TIA such as focal numbness or weakness. Patient denies side effects from medication. States taking it regularly.     History [name] [name] has a past medical history of Basal cell carcinoma, CAD (coronary artery disease), Cardiac arrest ([place]), Cataract, Diabetes mellitus, Diabetes mellitus without complication ([place]), Diverticulitis, Erectile dysfunction, Gastroesophageal reflux disease with hiatal hernia, Goiter, nontoxic, multinodular, Hiatal hernia, Hyperlipidemia, Inguinal hernia, Kidney stone (1980), Leg fracture, Liver hemangioma, Nephrolithiasis, and Vitamin D deficiency.   He has a past surgical history that includes LEFT HEART CATH AND CORONARY ANGIOGRAPHY (N/A, 08/25/2018); CORONARY STENT INTERVENTION (N/A, 08/25/2018); Laceration repair (2008); Back surgery (1984);  Tibia fracture surgery; Cataract extraction (Bilateral); and Eye surgery.   His family history includes Cancer in his father; Diabetes in an other family member; Early death (age of onset: 57) in his brother; Heart attack in his sister; Heart disease in his brother; Heart disease (age of onset: 3) in his mother.He reports that he quit smoking about 35 years ago. His smoking use included cigarettes. He has a 27.00 pack-year smoking history. He has never used smokeless tobacco. He reports that he does not drink alcohol or use drugs.  Current Outpatient Medications on File Prior to Visit  Medication Sig Dispense Refill   acetaminophen (TYLENOL) 325 MG tablet Take 2 tablets (650 mg total) by mouth every 4 (four) hours as needed for mild pain or headache.     amiodarone (PACERONE) 200 MG tablet Take 1 tablet (200 mg total) by mouth daily. 90 tablet 3   amLODipine (NORVASC) 5 MG tablet Take 1 tablet (5 mg total) by mouth daily. 90 tablet 3   apixaban (ELIQUIS) 5 MG TABS tablet Take 1 tablet (5 mg total) by mouth 2 (two) times daily. 180 tablet 3   atorvastatin (LIPITOR) 80 MG tablet Take 1 tablet (80 mg total) by mouth daily at 6 PM. 90 tablet 3   Cholecalciferol (VITAMIN D3 MAXIMUM STRENGTH) 125 MCG (5000 UT) capsule Take 5,000 Units by mouth. Take one tablet three times weekly     clopidogrel (PLAVIX) 75 MG tablet Take 1 tablet (75 mg total) by mouth daily with breakfast. 90 tablet 3   Garlic 300 MG TABS Take by mouth.     glucose blood test strip Check BS TID and PRN.DX.E11.9 100 each 11   Insulin Detemir (LEVEMIR) 100 UNIT/ML Pen Inject 50 Units into the skin daily. 45 mL 3  irbesartan (AVAPRO) 75 MG tablet Take 1 tablet (75 mg total) by mouth daily. 90 tablet 3   levothyroxine (SYNTHROID) 75 MCG tablet Take 1 tablet (75 mcg total) by mouth daily. 90 tablet 3   metFORMIN (GLUCOPHAGE) 1000 MG tablet Take 1 tablet (1,000 mg total) by mouth 2 (two) times daily with a meal. 180 tablet 0    metoprolol tartrate (LOPRESSOR) 50 MG tablet Take 0.5 tablets (25 mg total) by mouth 2 (two) times daily. 90 tablet 3   fluticasone (FLONASE) 50 MCG/ACT nasal spray Place 2 sprays into both nostrils daily as needed for allergies.     No current facility-administered medications on file prior to visit.     ROS Review of Systems  Constitutional: Negative.   HENT: Negative.   Eyes: Negative for visual disturbance.  Respiratory: Negative for cough and shortness of breath.   Cardiovascular: Negative for chest pain and leg swelling.  Gastrointestinal: Negative for abdominal pain, diarrhea, nausea and vomiting.  Genitourinary: Negative for difficulty urinating.  Musculoskeletal: Positive for neck pain (Crick in the neck for 3 weeks.). Negative for arthralgias and myalgias.  Skin: Negative for rash.  Neurological: Positive for headaches (left, ear and postauricular area. Started at the same time as the crick).  Psychiatric/Behavioral: Negative for sleep disturbance.    Objective:  BP (!) 158/74    Pulse (!) 55    Temp (!) 96.6 F (35.9 C)    Resp 20    Ht '5\' 10"'  (1.778 m)    Wt 165 lb (74.8 kg)    SpO2 100%    BMI 23.68 kg/m   BP Readings from Last 3 Encounters:  02/20/19 (!) 158/74  12/13/18 120/62  10/25/18 132/62    Wt Readings from Last 3 Encounters:  02/20/19 165 lb (74.8 kg)  12/13/18 162 lb (73.5 kg)  10/25/18 159 lb (72.1 kg)     Physical Exam Constitutional:      General: He is not in acute distress.    Appearance: He is well-developed.  HENT:     Head: Normocephalic and atraumatic.     Right Ear: External ear normal.     Left Ear: External ear normal.     Nose: Nose normal.  Eyes:     Conjunctiva/sclera: Conjunctivae normal.     Pupils: Pupils are equal, round, and reactive to light.  Neck:     Musculoskeletal: Normal range of motion and neck supple.  Cardiovascular:     Rate and Rhythm: Normal rate and regular rhythm.     Heart sounds: Normal heart sounds.  No murmur.  Pulmonary:     Effort: Pulmonary effort is normal. No respiratory distress.     Breath sounds: Normal breath sounds. No wheezing or rales.  Abdominal:     Palpations: Abdomen is soft.     Tenderness: There is no abdominal tenderness.  Musculoskeletal: Normal range of motion.  Skin:    General: Skin is warm and dry.  Neurological:     Mental Status: He is alert and oriented to person, place, and time.     Deep Tendon Reflexes: Reflexes are normal and symmetric.  Psychiatric:        Behavior: Behavior normal.        Thought Content: Thought content normal.        Judgment: Judgment normal.       Assessment & Plan:   [name] [name] was seen today for medical management of chronic issues, diabetes, hypothyroidism, hypertension and hyperlipidemia.  Diagnoses  and all orders for this visit:  Atrial fibrillation with rapid ventricular response ([place]) -     CBC with Differential/Platelet  Essential hypertension -     CBC with Differential/Platelet -     CMP14+EGFR  Hyperlipidemia -     CBC with Differential/Platelet -     Lipid panel  Hypothyroidism due to acquired atrophy of thyroid -     CBC with Differential/Platelet -     Thyroid Panel With TSH  Type 2 diabetes mellitus treated with insulin ([facility]) -     CBC with Differential/Platelet -     Bayer DCA Hb A1c Waived      I am having [name] [name] maintain his glucose blood, fluticasone, acetaminophen, Cholecalciferol, amiodarone, Insulin Detemir, Garlic, amLODipine, apixaban, atorvastatin, clopidogrel, irbesartan, metoprolol tartrate, levothyroxine, and metFORMIN.  No orders of the defined types were placed in this encounter.    Follow-up: Return in about 3 months (around 05/23/2019).  Claretta Fraise, M.D.

## 2019-02-21 LAB — CMP14+EGFR
ALT: 26 IU/L (ref 0–44)
AST: 20 IU/L (ref 0–40)
Albumin/Globulin Ratio: 1.9 (ref 1.2–2.2)
Albumin: 4.3 g/dL (ref 3.7–4.7)
Alkaline Phosphatase: 83 IU/L (ref 39–117)
BUN/Creatinine Ratio: 16 (ref 10–24)
BUN: 15 mg/dL (ref 8–27)
Bilirubin Total: 0.3 mg/dL (ref 0.0–1.2)
CO2: 21 mmol/L (ref 20–29)
Calcium: 9.5 mg/dL (ref 8.6–10.2)
Chloride: 104 mmol/L (ref 96–106)
Creatinine, Ser: 0.94 mg/dL (ref 0.76–1.27)
GFR calc Af Amer: 89 mL/min/{1.73_m2} (ref >59)
GFR calc non Af Amer: 77 mL/min/{1.73_m2} (ref >59)
Globulin, Total: 2.3 g/dL (ref 1.5–4.5)
Glucose: 136 mg/dL — ABNORMAL HIGH (ref 65–99)
Potassium: 4.5 mmol/L (ref 3.5–5.2)
Sodium: 141 mmol/L (ref 134–144)
Total Protein: 6.6 g/dL (ref 6.0–8.5)

## 2019-02-21 LAB — LIPID PANEL
Chol/HDL Ratio: 2.2 ratio (ref 0.0–5.0)
Cholesterol, Total: 82 mg/dL — ABNORMAL LOW (ref 100–199)
HDL: 38 mg/dL — ABNORMAL LOW (ref >39)
LDL Chol Calc (NIH): 30 mg/dL (ref 0–99)
Triglycerides: 58 mg/dL (ref 0–149)
VLDL Cholesterol Cal: 14 mg/dL (ref 5–40)

## 2019-02-21 LAB — CBC WITH DIFFERENTIAL/PLATELET
Basophils Absolute: 0.1 10*3/uL (ref 0.0–0.2)
Basos: 1 %
EOS (ABSOLUTE): 0.2 10*3/uL (ref 0.0–0.4)
Eos: 2 %
Hematocrit: 38.5 % (ref 37.5–51.0)
Hemoglobin: 12.9 g/dL — ABNORMAL LOW (ref 13.0–17.7)
Immature Grans (Abs): 0.1 10*3/uL (ref 0.0–0.1)
Immature Granulocytes: 1 %
Lymphocytes Absolute: 1.8 10*3/uL (ref 0.7–3.1)
Lymphs: 22 %
MCH: 28.4 pg (ref 26.6–33.0)
MCHC: 33.5 g/dL (ref 31.5–35.7)
MCV: 85 fL (ref 79–97)
Monocytes Absolute: 0.7 10*3/uL (ref 0.1–0.9)
Monocytes: 8 %
Neutrophils Absolute: 5.5 10*3/uL (ref 1.4–7.0)
Neutrophils: 66 %
Platelets: 233 10*3/uL (ref 150–450)
RBC: 4.54 x10E6/uL (ref 4.14–5.80)
RDW: 14.4 % (ref 11.6–15.4)
WBC: 8.4 10*3/uL (ref 3.4–10.8)

## 2019-02-21 LAB — THYROID PANEL WITH TSH
Free Thyroxine Index: 4 (ref 1.2–4.9)
T3 Uptake Ratio: 32 % (ref 24–39)
T4, Total: 12.6 ug/dL — ABNORMAL HIGH (ref 4.5–12.0)
TSH: 2.8 u[IU]/mL (ref 0.450–4.500)

## 2019-02-21 NOTE — Progress Notes (Signed)
Hello Javon,  Your lab result is normal and/or stable.Some minor variations that are not significant are commonly marked abnormal, but do not represent any medical problem for you.  Best regards, Audreana Hancox, M.D.

## 2019-02-27 ENCOUNTER — Encounter: Payer: Self-pay | Admitting: Family Medicine

## 2019-02-27 ENCOUNTER — Ambulatory Visit (INDEPENDENT_AMBULATORY_CARE_PROVIDER_SITE_OTHER): Payer: Medicare PPO | Admitting: Family Medicine

## 2019-02-27 DIAGNOSIS — T50905A Adverse effect of unspecified drugs, medicaments and biological substances, initial encounter: Secondary | ICD-10-CM | POA: Diagnosis not present

## 2019-02-27 NOTE — Progress Notes (Signed)
Subjective:    Patient ID: [name] [name], male    DOB: Apr 25, 1940, 78 y.o.   MRN: [id number]   HPI: [name] [name] is a 78 y.o. male presenting for weakness since starting Cymbalta. He is a little better today, but can't stand up from seated without support. Once up he can walk. Yesterday and the day before his legs were rubbery so that he couldn't stand at all. He didn't take he med last night. Symptoms started with the first dose and progressed until it was bad enough to be limiting. HEadache did go away after a day or two.   Depression screen [facility] 2/9 02/20/2019 09/20/2018 06/07/2018 01/25/2018 10/10/2017  Decreased Interest 0 0 0 0 0  Down, Depressed, Hopeless 0 0 0 0 0  PHQ - 2 Score 0 0 0 0 0  Altered sleeping - - - - -  Tired, decreased energy - - - - -  Change in appetite - - - - -  Feeling bad or failure about yourself  - - - - -  Trouble concentrating - - - - -  Moving slowly or fidgety/restless - - - - -  Suicidal thoughts - - - - -  PHQ-9 Score - - - - -     Relevant past medical, surgical, family and social history reviewed and updated as indicated.  Interim medical history since our last visit reviewed. Allergies and medications reviewed and updated.  ROS:  Review of Systems  Constitutional: Positive for activity change and fatigue. Negative for appetite change and fever.  HENT: Negative.   Neurological: Positive for weakness (nonfocal). Negative for dizziness, seizures, syncope, facial asymmetry, speech difficulty and numbness.     Social History   Tobacco Use  Smoking Status Former Smoker  . Packs/day: 1.00  . Years: 27.00  . Pack years: 27.00  . Types: Cigarettes  . Quit date: 04/20/1983  . Years since quitting: 35.8  Smokeless Tobacco Never Used  Tobacco Comment   Quit 25 years ago       Objective:     Wt Readings from Last 3 Encounters:  02/20/19 165 lb (74.8 kg)  12/13/18 162 lb (73.5 kg)  10/25/18 159 lb (72.1 kg)     Exam deferred. Pt.  [place] due to COVID 19. Phone visit performed.   Assessment & Plan:  No diagnosis found.  No orders of the defined types were placed in this encounter.   No orders of the defined types were placed in this encounter.     There are no diagnoses linked to this encounter.  Virtual Visit via telephone Note  I discussed the limitations, risks, security and privacy concerns of performing an evaluation and management service by telephone and the availability of in person appointments. The patient was identified with two identifiers. Pt.expressed understanding and agreed to proceed. Pt. Is at home. Dr. Livia Snellen is in his office.  Follow Up Instructions:   I discussed the assessment and treatment plan with the patient. The patient was provided an opportunity to ask questions and all were answered. The patient agreed with the plan and demonstrated an understanding of the instructions.   The patient was advised to call back or seek an in-person evaluation if the symptoms worsen or if the condition fails to improve as anticipated.   Total minutes including chart review and phone contact time: 13   Follow up plan: Return if symptoms worsen or fail to improve.  Claretta Fraise, [place]  [facility]

## 2019-02-28 ENCOUNTER — Other Ambulatory Visit: Payer: Self-pay | Admitting: *Deleted

## 2019-02-28 NOTE — Addendum Note (Signed)
Addended by: Antonietta Barcelona D on: 02/28/2019 12:17 PM   Modules accepted: Orders

## 2019-02-28 NOTE — Addendum Note (Signed)
Addended by: Antonietta Barcelona D on: 02/28/2019 12:14 PM   Modules accepted: Orders

## 2019-03-01 ENCOUNTER — Other Ambulatory Visit: Payer: Self-pay | Admitting: Cardiology

## 2019-03-19 ENCOUNTER — Other Ambulatory Visit: Payer: Self-pay | Admitting: *Deleted

## 2019-03-19 MED ORDER — ACCU-CHEK AVIVA PLUS VI STRP: ORAL_STRIP | 3 refills | Status: AC

## 2019-03-19 MED ORDER — ACCU-CHEK FASTCLIX LANCETS MISC: 3 refills | Status: AC

## 2019-03-19 MED ORDER — ACCU-CHEK AVIVA PLUS W/DEVICE KIT: PACK | 0 refills | Status: AC

## 2019-03-27 NOTE — Progress Notes (Signed)
Cardiology Office Note   Date:  03/28/2019   ID:  [name] [name], DOB Feb 17, 1941, MRN [id number]  PCP:  Claretta Fraise, MD  Cardiologist:   Minus Breeding, MD   Chief Complaint  Patient presents with  . Atrial Fibrillation      History of Present Illness: [name] [name] is a 78 y.o. male who presents for follow up of multiple cardiovascular risk factors.  I saw him in Feb 2016 for evaluation of this same thing.  He has diabetes that is not well controlled.  He did have a POET (Plain Old Exercise Treadmill) in 2012 that was negative for ischemia but there was some brief atrial fib post test.  In April 2020. Cardiac catheterization showed severe proximal LAD stenosis and he underwent successful stenting and was extubated 08/23/2018. His hospital course complicated by atrial fibrillation with RVR maintained on aspirin Plavix as well as Eliquis and amiodarone.  He went to rehab after discharge  Since he was last seen he has had no new cardiovascular complaints.  He does not feel any tachypalpitations.  He is not walking as much because of the cold.  He does not describe any chest pressure, neck or arm discomfort.  Is not having any new shortness of breath, PND or orthopnea.  He has had no weight gain or edema.   Past Medical History:  Diagnosis Date  . Basal cell carcinoma    left forehead, nose, neck  . CAD (coronary artery disease)    a. s/p DES to Prox-LAD and DES to LCx in 08/2018  . Cardiac arrest ([place])   . Cataract   . Diabetes mellitus   . Diabetes mellitus without complication ([place])   . Diverticulitis   . Erectile dysfunction   . Gastroesophageal reflux disease with hiatal hernia   . Goiter, nontoxic, multinodular    with macrocyst  . Hiatal hernia    With reflex  . Hyperlipidemia   . Inguinal hernia    right side  . Kidney stone 1980  . Leg fracture    Left  . Liver hemangioma   . Nephrolithiasis   . Vitamin D deficiency     Past Surgical History:   Procedure Laterality Date  . BACK SURGERY  1984  . CATARACT EXTRACTION Bilateral   . CORONARY STENT INTERVENTION N/A 08/25/2018   Procedure: CORONARY STENT INTERVENTION;  Surgeon: Sherren Mocha, MD;  Location: [facility];  Service: Cardiovascular;  Laterality: N/A;  . EYE SURGERY    . LACERATION REPAIR  2008   Dr. Lenon Curt -to hand  . LEFT HEART CATH AND CORONARY ANGIOGRAPHY N/A 08/25/2018   Procedure: LEFT HEART CATH AND CORONARY ANGIOGRAPHY;  Surgeon: Sherren Mocha, MD;  Location: [facility];  Service: Cardiovascular;  Laterality: N/A;  . TIBIA FRACTURE SURGERY     left leg     Current Outpatient Medications  Medication Sig Dispense Refill  . acetaminophen (TYLENOL) 325 MG tablet Take 2 tablets (650 mg total) by mouth every 4 (four) hours as needed for mild pain or headache.    Marland Kitchen amLODipine (NORVASC) 5 MG tablet Take 1 tablet (5 mg total) by mouth daily. 90 tablet 3  . apixaban (ELIQUIS) 5 MG TABS tablet Take 1 tablet (5 mg total) by mouth 2 (two) times daily. 180 tablet 3  . atorvastatin (LIPITOR) 80 MG tablet Take 1 tablet (80 mg total) by mouth daily at 6 PM. 90 tablet 3  . Cholecalciferol (VITAMIN D3 MAXIMUM  STRENGTH) 125 MCG (5000 UT) capsule Take 5,000 Units by mouth. Take one tablet three times weekly    . clopidogrel (PLAVIX) 75 MG tablet Take 1 tablet (75 mg total) by mouth daily with breakfast. 90 tablet 3  . fluticasone (FLONASE) 50 MCG/ACT nasal spray Place 2 sprays into both nostrils daily as needed for allergies.    . Garlic 409 MG TABS Take by mouth.    . Insulin Detemir (LEVEMIR) 100 UNIT/ML Pen Inject 60 Units into the skin daily. 45 mL 3  . irbesartan (AVAPRO) 75 MG tablet Take 1 tablet (75 mg total) by mouth daily. 90 tablet 3  . levothyroxine (SYNTHROID) 75 MCG tablet Take 1 tablet (75 mcg total) by mouth daily. 90 tablet 3  . metFORMIN (GLUCOPHAGE) 1000 MG tablet Take 1 tablet (1,000 mg total) by mouth 2 (two) times daily with a meal. 180 tablet 0  .  metoprolol tartrate (LOPRESSOR) 50 MG tablet Take 0.5 tablets (25 mg total) by mouth 2 (two) times daily. 90 tablet 3  . Accu-Chek FastClix Lancets MISC Check BS TID Dx E11.9 300 each 3  . Blood Glucose Monitoring Suppl (ACCU-CHEK AVIVA PLUS) w/Device KIT Check BS TID Dx E11.9 1 kit 0  . glucose blood (ACCU-CHEK AVIVA PLUS) test strip Check BS TID Dx E11.9 300 each 3   No current facility-administered medications for this visit.     Allergies:   Actos [pioglitazone hydrochloride], Lisinopril, Cymbalta [duloxetine hcl], Invokana [canagliflozin], Niaspan [niacin er], and Ramipril    ROS:  Please see the history of present illness.   Otherwise, review of systems are positive for none.   All other systems are reviewed and negative.    PHYSICAL EXAM: VS:  BP (!) 149/76   Pulse 69   Ht '5\' 10"'  (1.778 m)   Wt 167 lb (75.8 kg)   BMI 23.96 kg/m  , BMI Body mass index is 23.96 kg/m. GENERAL:  Well appearing NECK:  No jugular venous distention, waveform within normal limits, carotid upstroke brisk and symmetric, no bruits, no thyromegaly LUNGS:  Clear to auscultation bilaterally CHEST:  Unremarkable HEART:  PMI not displaced or sustained,S1 and S2 within normal limits, no S3, no S4, no clicks, no rubs, no murmurs ABD:  Flat, positive bowel sounds normal in frequency in pitch, no bruits, no rebound, no guarding, no midline pulsatile mass, no hepatomegaly, no splenomegaly EXT:  2 plus pulses throughout, no edema, no cyanosis no clubbing     EKG:  EKG is  ordered today. NSR, rate 69, axis within normal limits, first-degree AV block, no acute ST-T wave changes.  Recent Labs: 08/30/2018: Magnesium 2.2 02/20/2019: ALT 26; BUN 15; Creatinine, Ser 0.94; Hemoglobin 12.9; Platelets 233; Potassium 4.5; Sodium 141; TSH 2.800    Lipid Panel    Component Value Date/Time   CHOL 82 (L) 02/20/2019 0808   CHOL 128 10/16/2012 1545   TRIG 58 02/20/2019 0808   TRIG 52 03/18/2016 1126   TRIG 102  10/16/2012 1545   HDL 38 (L) 02/20/2019 0808   HDL 53 03/18/2016 1126   HDL 45 10/16/2012 1545   CHOLHDL 2.2 02/20/2019 0808   LDLCALC 30 02/20/2019 0808   LDLCALC 66 12/13/2013 0842   LDLCALC 63 10/16/2012 1545      Wt Readings from Last 3 Encounters:  03/28/19 167 lb (75.8 kg)  02/20/19 165 lb (74.8 kg)  12/13/18 162 lb (73.5 kg)      Other studies Reviewed: Additional studies/ records that were reviewed  today include:  Labs Review of the above records demonstrates:  Please see elsewhere in the note.     ASSESSMENT AND PLAN:  Out of Hospital VF Arrest He is doing okay from a cardiovascular standpoint.  No change in therapy.   CAD  has had no chest discomfort.  He will continue on the Plavix which I will stop in May.   Paroxysmal Atrial Fibrillation He has not had any symptomatic paroxysms of this.  I will stop his amiodarone.  He did have paroxysmal A. fib back in 2012 so I do not think and then will stop ever his anticoagulation although he is having trouble affording Eliquis and we might need to switch him to warfarin at the next appointment.  Given the fact that he is not having any symptoms I think he is better off of the amiodarone seen if he redevelops any symptomatic sustained fibrillation.  HTN Blood pressures is controlled and he will continue the meds as listed.    HLD LDL earlier this year was excellent.   .  The most recent was 10.  No change in therapy.   Type 2 DM A1c was 8.7.  He is having is followed by his primary provider.   Current medicines are reviewed at length with the patient today.  The patient does not have concerns regarding medicines.  The following changes have been made:  As above  Labs/ tests ordered today include: None   Orders Placed This Encounter  Procedures  . EKG 12-Lead     Disposition:  Follow up with me in Sept   Signed, Minus Breeding, MD  03/28/2019 3:19 PM    [place]

## 2019-03-28 ENCOUNTER — Other Ambulatory Visit: Payer: Self-pay

## 2019-03-28 ENCOUNTER — Ambulatory Visit: Payer: Medicare PPO | Admitting: Cardiology

## 2019-03-28 ENCOUNTER — Encounter: Payer: Self-pay | Admitting: Cardiology

## 2019-03-28 VITALS — BP 149/76 | HR 69 | Ht 70.0 in | Wt 167.0 lb

## 2019-03-28 DIAGNOSIS — I2510 Atherosclerotic heart disease of native coronary artery without angina pectoris: Secondary | ICD-10-CM

## 2019-03-28 DIAGNOSIS — I10 Essential (primary) hypertension: Secondary | ICD-10-CM | POA: Diagnosis not present

## 2019-03-28 NOTE — Patient Instructions (Signed)
Medication Instructions:  Please discontinue your Amiodarone. Continue all other medications as listed.  *If you need a refill on your cardiac medications before your next appointment, please call your pharmacy*  Follow-Up: At [facility], you and your health needs are our priority.  As part of our continuing mission to provide you with exceptional heart care, we have created designated Provider Care Teams.  These Care Teams include your primary Cardiologist (physician) and Advanced Practice Providers (APPs -  Physician Assistants and Nurse Practitioners) who all work together to provide you with the care you need, when you need it.  Your next appointment:   2 month(s)  The format for your next appointment:   In Person  Provider:   Minus Breeding, MD  Thank you for choosing [facility]!!

## 2019-03-29 ENCOUNTER — Other Ambulatory Visit: Payer: Self-pay | Admitting: *Deleted

## 2019-03-29 MED ORDER — METOPROLOL TARTRATE 50 MG PO TABS: 25.0000 mg | ORAL_TABLET | Freq: Two times a day (BID) | ORAL | 3 refills | Status: DC

## 2019-03-29 NOTE — Progress Notes (Signed)
Pt requested refill be sent into [facility].  This was done.  Previous RX from 8/26 was sent to [facility]

## 2019-05-17 DIAGNOSIS — L570 Actinic keratosis: Secondary | ICD-10-CM | POA: Diagnosis not present

## 2019-05-17 DIAGNOSIS — Z08 Encounter for follow-up examination after completed treatment for malignant neoplasm: Secondary | ICD-10-CM | POA: Diagnosis not present

## 2019-05-17 DIAGNOSIS — C44321 Squamous cell carcinoma of skin of nose: Secondary | ICD-10-CM | POA: Diagnosis not present

## 2019-05-17 DIAGNOSIS — X32XXXD Exposure to sunlight, subsequent encounter: Secondary | ICD-10-CM | POA: Diagnosis not present

## 2019-05-17 DIAGNOSIS — Z85828 Personal history of other malignant neoplasm of skin: Secondary | ICD-10-CM | POA: Diagnosis not present

## 2019-05-21 ENCOUNTER — Encounter: Payer: Self-pay | Admitting: Cardiology

## 2019-05-21 NOTE — Progress Notes (Signed)
Cardiology Office Note   Date:  05/23/2019   ID:  [name] [name], DOB [birth year], MRN [id number]  PCP:  Claretta Fraise, MD  Cardiologist:   Minus Breeding, MD   No chief complaint on file.     History of Present Illness: [name] [name] is a 79 y.o. male who presents for follow up of multiple cardiovascular risk factors.  I saw him in Feb 2016 for evaluation of this same thing.  He has diabetes that is not well controlled.  He did have a POET (Plain Old Exercise Treadmill) in 2012 that was negative for ischemia but there was some brief atrial fib post test.  In April 2020. cardiac catheterization showed severe proximal LAD stenosis and he underwent successful stenting and was extubated 08/23/2018. His hospital course complicated by atrial fibrillation with RVR maintained on aspirin Plavix as well as Eliquis and amiodarone.  He went to rehab after discharge  Since he was last seen he says he has done well.  He has not felt any palpitations.  I did stop amiodarone after the last visit.  He still taking his Eliquis.  He denies any new palpitations, presyncope or syncope.  He has had no chest pressure, neck or arm discomfort.  He has had no weight gain or edema.   Past Medical History:  Diagnosis Date  . Basal cell carcinoma    left forehead, nose, neck  . CAD (coronary artery disease)    a. s/p DES to Prox-LAD and DES to LCx in 08/2018  . Cardiac arrest ([place])   . Cataract   . Diabetes mellitus without complication ([place])   . Diverticulitis   . Erectile dysfunction   . Gastroesophageal reflux disease with hiatal hernia   . Goiter, nontoxic, multinodular    with macrocyst  . Hiatal hernia    With reflex  . Hyperlipidemia   . Inguinal hernia    right side  . Kidney stone 1980  . Leg fracture    Left  . Liver hemangioma   . Nephrolithiasis   . Vitamin D deficiency     Past Surgical History:  Procedure Laterality Date  . BACK SURGERY  1984  . CATARACT EXTRACTION Bilateral    . CORONARY STENT INTERVENTION N/A 08/25/2018   Procedure: CORONARY STENT INTERVENTION;  Surgeon: Sherren Mocha, MD;  Location: [facility];  Service: Cardiovascular;  Laterality: N/A;  . EYE SURGERY    . LACERATION REPAIR  2008   Dr. Lenon Curt -to hand  . LEFT HEART CATH AND CORONARY ANGIOGRAPHY N/A 08/25/2018   Procedure: LEFT HEART CATH AND CORONARY ANGIOGRAPHY;  Surgeon: Sherren Mocha, MD;  Location: [facility];  Service: Cardiovascular;  Laterality: N/A;  . TIBIA FRACTURE SURGERY     left leg     Current Outpatient Medications  Medication Sig Dispense Refill  . acetaminophen (TYLENOL) 325 MG tablet Take 2 tablets (650 mg total) by mouth every 4 (four) hours as needed for mild pain or headache.    Marland Kitchen amLODipine (NORVASC) 5 MG tablet Take 1 tablet (5 mg total) by mouth daily. 90 tablet 3  . apixaban (ELIQUIS) 5 MG TABS tablet Take 1 tablet (5 mg total) by mouth 2 (two) times daily. 180 tablet 3  . atorvastatin (LIPITOR) 80 MG tablet Take 1 tablet (80 mg total) by mouth daily at 6 PM. 90 tablet 3  . Cholecalciferol (VITAMIN D3 MAXIMUM STRENGTH) 125 MCG (5000 UT) capsule Take 5,000 Units by mouth. Take  one tablet three times weekly    . clopidogrel (PLAVIX) 75 MG tablet Take 1 tablet (75 mg total) by mouth daily with breakfast. 90 tablet 3  . dapagliflozin propanediol (FARXIGA) 5 MG TABS tablet Take 5 mg by mouth daily before breakfast. For blood glucose 30 tablet 2  . fluticasone (FLONASE) 50 MCG/ACT nasal spray Place 2 sprays into both nostrils daily as needed for allergies.    . Garlic 016 MG TABS Take by mouth.    . Insulin Detemir (LEVEMIR) 100 UNIT/ML Pen Inject 60 Units into the skin daily. 45 mL 3  . irbesartan (AVAPRO) 150 MG tablet Take 1 tablet (150 mg total) by mouth daily. 90 tablet 3  . levothyroxine (SYNTHROID) 75 MCG tablet Take 1 tablet (75 mcg total) by mouth daily. 90 tablet 3  . metFORMIN (GLUCOPHAGE) 1000 MG tablet Take 1 tablet (1,000 mg total) by mouth 2  (two) times daily with a meal. 180 tablet 3  . metoprolol tartrate (LOPRESSOR) 50 MG tablet Take 0.5 tablets (25 mg total) by mouth 2 (two) times daily. 90 tablet 3  . Accu-Chek FastClix Lancets MISC Check BS TID Dx E11.9 300 each 3  . Blood Glucose Monitoring Suppl (ACCU-CHEK AVIVA PLUS) w/Device KIT Check BS TID Dx E11.9 1 kit 0  . glucose blood (ACCU-CHEK AVIVA PLUS) test strip Check BS TID Dx E11.9 300 each 3   No current facility-administered medications for this visit.    Allergies:   Actos [pioglitazone hydrochloride], Lisinopril, Cymbalta [duloxetine hcl], Invokana [canagliflozin], Niaspan [niacin er], and Ramipril    ROS:  Please see the history of present illness.   Otherwise, review of systems are positive for none.   All other systems are reviewed and negative.    PHYSICAL EXAM: VS:  BP (!) 144/76   Pulse 80   Ht '5\' 10"'  (1.778 m)   Wt 169 lb (76.7 kg)   BMI 24.25 kg/m  , BMI Body mass index is 24.25 kg/m. GENERAL:  Well appearing NECK:  No jugular venous distention, waveform within normal limits, carotid upstroke brisk and symmetric, no bruits, no thyromegaly LUNGS:  Clear to auscultation bilaterally CHEST:  Unremarkable HEART:  PMI not displaced or sustained,S1 and S2 within normal limits, no S3, no S4, no clicks, no rubs, no murmurs ABD:  Flat, positive bowel sounds normal in frequency in pitch, no bruits, no rebound, no guarding, no midline pulsatile mass, no hepatomegaly, no splenomegaly EXT:  2 plus pulses throughout, no edema, no cyanosis no clubbing   EKG:  EKG is not ordered today.   Recent Labs: 08/30/2018: Magnesium 2.2 02/20/2019: ALT 26; BUN 15; Creatinine, Ser 0.94; Hemoglobin 12.9; Platelets 233; Potassium 4.5; Sodium 141; TSH 2.800    Lipid Panel    Component Value Date/Time   CHOL 82 (L) 02/20/2019 0808   CHOL 128 10/16/2012 1545   TRIG 58 02/20/2019 0808   TRIG 52 03/18/2016 1126   TRIG 102 10/16/2012 1545   HDL 38 (L) 02/20/2019 0808   HDL  53 03/18/2016 1126   HDL 45 10/16/2012 1545   CHOLHDL 2.2 02/20/2019 0808   LDLCALC 30 02/20/2019 0808   LDLCALC 66 12/13/2013 0842   LDLCALC 63 10/16/2012 1545      Wt Readings from Last 3 Encounters:  05/23/19 169 lb (76.7 kg)  05/23/19 166 lb 6.4 oz (75.5 kg)  03/28/19 167 lb (75.8 kg)      Other studies Reviewed: Additional studies/ records that were reviewed today include:  Labs  Review of the above records demonstrates:  Please see elsewhere in the note.     ASSESSMENT AND PLAN:  Out of Hospital VF Arrest He is doing well from this standpoint.  No change in therapy.   CAD  He is going to remain off of aspirin and remain on Plavix and for now Eliquis.  If he has no fibrillation going forward and likely would stop the Eliquis.  He does not say he would take it anyway because of the cost.  He will continue for another 3 months.  Paroxysmal Atrial Fibrillation As above.  HTN BP is slightly elevated but his Avapro was increased today and I agree with this.   HLD LDL was 70 with an HDL of 45.  No change in therapy.   Type 2 DM A1c was not at target and Wilder Glade was added.   COVID EDUCATION:   I educated him about the vaccine.  His wife is getting vaccinated and I encouraged him to do the same.  Current medicines are reviewed at length with the patient today.  The patient does not have concerns regarding medicines.  The following changes have been made:  As above  Labs/ tests ordered today include: None   No orders of the defined types were placed in this encounter.    Disposition:  Follow up with me in 3 months.   Signed, Minus Breeding, MD  05/23/2019 12:55 PM    [facility]

## 2019-05-22 ENCOUNTER — Other Ambulatory Visit: Payer: Self-pay

## 2019-05-23 ENCOUNTER — Ambulatory Visit: Payer: Medicare PPO | Admitting: Cardiology

## 2019-05-23 ENCOUNTER — Other Ambulatory Visit: Payer: Self-pay

## 2019-05-23 ENCOUNTER — Encounter: Payer: Self-pay | Admitting: Cardiology

## 2019-05-23 ENCOUNTER — Ambulatory Visit: Payer: Medicare PPO | Admitting: Family Medicine

## 2019-05-23 ENCOUNTER — Encounter: Payer: Self-pay | Admitting: Family Medicine

## 2019-05-23 VITALS — BP 165/76 | HR 57 | Temp 96.2°F | Ht 70.0 in | Wt 166.4 lb

## 2019-05-23 VITALS — BP 144/76 | HR 80 | Ht 70.0 in | Wt 169.0 lb

## 2019-05-23 DIAGNOSIS — Z8679 Personal history of other diseases of the circulatory system: Secondary | ICD-10-CM | POA: Diagnosis not present

## 2019-05-23 DIAGNOSIS — E782 Mixed hyperlipidemia: Secondary | ICD-10-CM

## 2019-05-23 DIAGNOSIS — I10 Essential (primary) hypertension: Secondary | ICD-10-CM | POA: Diagnosis not present

## 2019-05-23 DIAGNOSIS — Z794 Long term (current) use of insulin: Secondary | ICD-10-CM | POA: Diagnosis not present

## 2019-05-23 DIAGNOSIS — E118 Type 2 diabetes mellitus with unspecified complications: Secondary | ICD-10-CM

## 2019-05-23 DIAGNOSIS — E785 Hyperlipidemia, unspecified: Secondary | ICD-10-CM

## 2019-05-23 DIAGNOSIS — Z7189 Other specified counseling: Secondary | ICD-10-CM | POA: Diagnosis not present

## 2019-05-23 DIAGNOSIS — E034 Atrophy of thyroid (acquired): Secondary | ICD-10-CM

## 2019-05-23 DIAGNOSIS — E119 Type 2 diabetes mellitus without complications: Secondary | ICD-10-CM | POA: Diagnosis not present

## 2019-05-23 DIAGNOSIS — I480 Paroxysmal atrial fibrillation: Secondary | ICD-10-CM

## 2019-05-23 DIAGNOSIS — I4891 Unspecified atrial fibrillation: Secondary | ICD-10-CM

## 2019-05-23 DIAGNOSIS — I2510 Atherosclerotic heart disease of native coronary artery without angina pectoris: Secondary | ICD-10-CM | POA: Diagnosis not present

## 2019-05-23 LAB — BAYER DCA HB A1C WAIVED: HB A1C (BAYER DCA - WAIVED): 9.3 % — ABNORMAL HIGH (ref <7.0)

## 2019-05-23 MED ORDER — FARXIGA 5 MG PO TABS: 5.0000 mg | ORAL_TABLET | Freq: Every day | ORAL | 2 refills | Status: DC

## 2019-05-23 MED ORDER — IRBESARTAN 75 MG PO TABS: 150.0000 mg | ORAL_TABLET | Freq: Every day | ORAL | 3 refills | Status: DC

## 2019-05-23 MED ORDER — FARXIGA 5 MG PO TABS: 5.0000 mg | ORAL_TABLET | Freq: Every day | ORAL | 1 refills | Status: DC

## 2019-05-23 MED ORDER — METFORMIN HCL 1000 MG PO TABS: 1000.0000 mg | ORAL_TABLET | Freq: Two times a day (BID) | ORAL | 3 refills | Status: DC

## 2019-05-23 MED ORDER — IRBESARTAN 150 MG PO TABS: 150.0000 mg | ORAL_TABLET | Freq: Every day | ORAL | 3 refills | Status: DC

## 2019-05-23 MED ORDER — INSULIN DETEMIR 100 UNIT/ML FLEXPEN: 60.0000 [IU] | PEN_INJECTOR | Freq: Every day | SUBCUTANEOUS | 3 refills | Status: DC

## 2019-05-23 NOTE — Progress Notes (Signed)
Subjective:  Patient ID: [name] [name],  male    DOB: January 12, 1941  Age: 79 y.o.    CC: Follow-up (3 month)   HPI [name] [name] presents for  follow-up of hypertension. Patient has no history of headache chest pain or shortness of breath or recent cough. Patient also denies symptoms of TIA such as numbness weakness lateralizing. Patient denies side effects from medication. States taking it regularly.  Patient also  in for follow-up of elevated cholesterol. Doing well without complaints on current medication. Denies side effects  including myalgia and arthralgia and nausea. Also in today for liver function testing. Currently no chest pain, shortness of breath or other cardiovascular related symptoms noted.  Follow-up of diabetes. Patient does check blood sugar at home. Readings run between 150-154fsting Patient denies symptoms such as excessive hunger or urinary frequency, excessive hunger, nausea No significant hypoglycemic spells noted. Medications reviewed. Pt reports taking them regularly. Pt. denies complication/adverse reaction today.    follow-up on  thyroid. The patient has a history of hypothyroidism for many years. It has been stable recently. Pt. denies any change in  voice, loss of hair, heat or cold intolerance. Energy level has been adequate to good. Patient denies constipation and diarrhea. No myxedema. Medication is as noted below. Verified that pt is taking it daily on an empty stomach. Well tolerated.    History LMelberthas a past medical history of Basal cell carcinoma, CAD (coronary artery disease), Cardiac arrest ([place], Cataract, Diabetes mellitus without complication ([place], Diverticulitis, Erectile dysfunction, Gastroesophageal reflux disease with hiatal hernia, Goiter, nontoxic, multinodular, Hiatal hernia, Hyperlipidemia, Inguinal hernia, Kidney stone (1980), Leg fracture, Liver hemangioma, Nephrolithiasis, and Vitamin D deficiency.   He has a past surgical history  that includes LEFT HEART CATH AND CORONARY ANGIOGRAPHY (N/A, 08/25/2018); CORONARY STENT INTERVENTION (N/A, 08/25/2018); Laceration repair (2008); Back surgery (1984); Tibia fracture surgery; Cataract extraction (Bilateral); and Eye surgery.   His family history includes Cancer in his father; Diabetes in an other family member; Early death (age of onset: 354 in his brother; Heart attack in his sister; Heart disease in his brother; Heart disease (age of onset: 771 in his mother.He reports that he quit smoking about 36 years ago. His smoking use included cigarettes. He has a 27.00 pack-year smoking history. He has never used smokeless tobacco. He reports that he does not drink alcohol or use drugs.  Current Outpatient Medications on File Prior to Visit  Medication Sig Dispense Refill  . Accu-Chek FastClix Lancets MISC Check BS TID Dx E11.9 300 each 3  . acetaminophen (TYLENOL) 325 MG tablet Take 2 tablets (650 mg total) by mouth every 4 (four) hours as needed for mild pain or headache.    .Marland KitchenamLODipine (NORVASC) 5 MG tablet Take 1 tablet (5 mg total) by mouth daily. 90 tablet 3  . apixaban (ELIQUIS) 5 MG TABS tablet Take 1 tablet (5 mg total) by mouth 2 (two) times daily. 180 tablet 3  . atorvastatin (LIPITOR) 80 MG tablet Take 1 tablet (80 mg total) by mouth daily at 6 PM. 90 tablet 3  . Blood Glucose Monitoring Suppl (ACCU-CHEK AVIVA PLUS) w/Device KIT Check BS TID Dx E11.9 1 kit 0  . Cholecalciferol (VITAMIN D3 MAXIMUM STRENGTH) 125 MCG (5000 UT) capsule Take 5,000 Units by mouth. Take one tablet three times weekly    . clopidogrel (PLAVIX) 75 MG tablet Take 1 tablet (75 mg total) by mouth daily with breakfast. 90 tablet 3  . fluticasone (FLONASE)  50 MCG/ACT nasal spray Place 2 sprays into both nostrils daily as needed for allergies.    . Garlic 263 MG TABS Take by mouth.    Marland Kitchen glucose blood (ACCU-CHEK AVIVA PLUS) test strip Check BS TID Dx E11.9 300 each 3  . levothyroxine (SYNTHROID) 75 MCG tablet Take  1 tablet (75 mcg total) by mouth daily. 90 tablet 3  . metoprolol tartrate (LOPRESSOR) 50 MG tablet Take 0.5 tablets (25 mg total) by mouth 2 (two) times daily. 90 tablet 3   No current facility-administered medications on file prior to visit.    ROS Review of Systems  Constitutional: Negative.   HENT: Negative.   Eyes: Negative for visual disturbance.  Respiratory: Negative for cough and shortness of breath.   Cardiovascular: Negative for chest pain and leg swelling.  Gastrointestinal: Negative for abdominal pain, diarrhea, nausea and vomiting.  Genitourinary: Negative for difficulty urinating.  Musculoskeletal: Negative for arthralgias and myalgias.  Skin: Negative for rash.  Neurological: Negative for headaches.  Psychiatric/Behavioral: Negative for sleep disturbance.    Objective:  BP (!) 165/76   Pulse (!) 57   Temp (!) 96.2 F (35.7 C) (Temporal)   Ht 5' 10" (1.778 m)   Wt 166 lb 6.4 oz (75.5 kg)   BMI 23.88 kg/m   BP Readings from Last 3 Encounters:  05/23/19 (!) 144/76  05/23/19 (!) 165/76  03/28/19 (!) 149/76    Wt Readings from Last 3 Encounters:  05/23/19 169 lb (76.7 kg)  05/23/19 166 lb 6.4 oz (75.5 kg)  03/28/19 167 lb (75.8 kg)     Physical Exam Vitals reviewed.  Constitutional:      Appearance: He is well-developed.  HENT:     Head: Normocephalic and atraumatic.     Right Ear: Tympanic membrane and external ear normal. No decreased hearing noted.     Left Ear: Tympanic membrane and external ear normal. No decreased hearing noted.     Mouth/Throat:     Pharynx: No oropharyngeal exudate or posterior oropharyngeal erythema.  Eyes:     Pupils: Pupils are equal, round, and reactive to light.  Cardiovascular:     Rate and Rhythm: Normal rate and regular rhythm.     Heart sounds: No murmur.  Pulmonary:     Effort: No respiratory distress.     Breath sounds: Normal breath sounds.  Abdominal:     General: Bowel sounds are normal.     Palpations:  Abdomen is soft. There is no mass.     Tenderness: There is no abdominal tenderness.  Musculoskeletal:     Cervical back: Normal range of motion and neck supple.     Diabetic Foot Exam - Simple   No data filed        Assessment & Plan:   Felicia was seen today for follow-up.  Diagnoses and all orders for this visit:  Atrial fibrillation with rapid ventricular response (HCC) -     CBC with Differential/Platelet -     CMP14+EGFR -     Bayer DCA Hb A1c Waived  Essential hypertension -     CBC with Differential/Platelet -     CMP14+EGFR -     Bayer DCA Hb A1c Waived -     irbesartan (AVAPRO) 150 MG tablet; Take 1 tablet (150 mg total) by mouth daily.  Mixed hyperlipidemia -     CBC with Differential/Platelet -     CMP14+EGFR -     Bayer DCA Hb A1c Waived  Hypothyroidism  due to acquired atrophy of thyroid -     CBC with Differential/Platelet -     CMP14+EGFR -     Bayer DCA Hb A1c Waived -     Thyroid Panel With TSH  Type 2 diabetes mellitus treated with insulin (HCC) -     CBC with Differential/Platelet -     CMP14+EGFR -     Bayer DCA Hb A1c Waived -     metFORMIN (GLUCOPHAGE) 1000 MG tablet; Take 1 tablet (1,000 mg total) by mouth 2 (two) times daily with a meal. -     Insulin Detemir (LEVEMIR) 100 UNIT/ML Pen; Inject 60 Units into the skin daily. -     irbesartan (AVAPRO) 150 MG tablet; Take 1 tablet (150 mg total) by mouth daily. -     dapagliflozin propanediol (FARXIGA) 5 MG TABS tablet; Take 5 mg by mouth daily before breakfast. For blood glucose  Other orders -     Discontinue: irbesartan (AVAPRO) 75 MG tablet; Take 2 tablets (150 mg total) by mouth daily. -     Discontinue: dapagliflozin propanediol (FARXIGA) 5 MG TABS tablet; Take 5 mg by mouth daily before breakfast. For blood glucose  I gave the patient a handout with refreshers/reminders regarding carbohydrate control in his diet.  Discussion is needed to indicate that he gotten off track with warm.   Additionally I had mentioned to him to start taking aspirin but on reconsideration since he is taking the Eliquis will multiply that his diagnoses overall appear stable with the exception of the diabetes which is uncontrolled based on the elevated A1c at 9.3 today.  In addition to the diet suggestions I went ahead with a new prescription medicine in the form of Farxiga to go along with his ongoing diabetes regimen.  He will be followed every 3 months.  Should this not bring his A1c down promptly I think he would be a good candidate for GLP-1 medication I have discontinued Finbar Nippert. Tignor's irbesartan and amiodarone. I am also having him start on irbesartan. Additionally, I am having him maintain his fluticasone, acetaminophen, Cholecalciferol, Garlic, amLODipine, apixaban, atorvastatin, clopidogrel, levothyroxine, Accu-Chek Aviva Plus, Accu-Chek Aviva Plus, Accu-Chek FastClix Lancets, metoprolol tartrate, metFORMIN, Insulin Detemir, and Farxiga.  Meds ordered this encounter  Medications  . metFORMIN (GLUCOPHAGE) 1000 MG tablet    Sig: Take 1 tablet (1,000 mg total) by mouth 2 (two) times daily with a meal.    Dispense:  180 tablet    Refill:  3  . Insulin Detemir (LEVEMIR) 100 UNIT/ML Pen    Sig: Inject 60 Units into the skin daily.    Dispense:  45 mL    Refill:  3  . DISCONTD: irbesartan (AVAPRO) 75 MG tablet    Sig: Take 2 tablets (150 mg total) by mouth daily.    Dispense:  90 tablet    Refill:  3  . irbesartan (AVAPRO) 150 MG tablet    Sig: Take 1 tablet (150 mg total) by mouth daily.    Dispense:  90 tablet    Refill:  3  . DISCONTD: dapagliflozin propanediol (FARXIGA) 5 MG TABS tablet    Sig: Take 5 mg by mouth daily before breakfast. For blood glucose    Dispense:  30 tablet    Refill:  2  . dapagliflozin propanediol (FARXIGA) 5 MG TABS tablet    Sig: Take 5 mg by mouth daily before breakfast. For blood glucose    Dispense:  90 tablet  Refill:  1    Please update this  medication to a 90 days  Supply (Sent in a few minutes ago as 30 day only.) Thanks, WS     Follow-up: Return in about 3 months (around 08/20/2019).  Claretta Fraise, M.D.

## 2019-05-23 NOTE — Patient Instructions (Signed)
Medication Instructions:  The current medical regimen is effective;  continue present plan and medications.   *If you need a refill on your cardiac medications before your next appointment, please call your pharmacy*  Follow-Up: At [facility], you and your health needs are our priority.  As part of our continuing mission to provide you with exceptional heart care, we have created designated Provider Care Teams.  These Care Teams include your primary Cardiologist (physician) and Advanced Practice Providers (APPs -  Physician Assistants and Nurse Practitioners) who all work together to provide you with the care you need, when you need it.  Your next appointment:   3 month(s)  The format for your next appointment:   In Person  Provider:   Minus Breeding, MD  Thank you for choosing [facility]!!

## 2019-05-23 NOTE — Patient Instructions (Signed)
Carbohydrate Counting for Diabetes Mellitus, Adult  Carbohydrate counting is a method of keeping track of how many carbohydrates you eat. Eating carbohydrates naturally increases the amount of sugar (glucose) in the blood. Counting how many carbohydrates you eat helps keep your blood glucose within normal limits, which helps you manage your diabetes (diabetes mellitus). It is important to know how many carbohydrates you can safely have in each meal. This is different for every person. A diet and nutrition specialist (registered dietitian) can help you make a meal plan and calculate how many carbohydrates you should have at each meal and snack. Carbohydrates are found in the following foods:  Grains, such as breads and cereals.  Dried beans and soy products.  Starchy vegetables, such as potatoes, peas, and corn.  Fruit and fruit juices.  Milk and yogurt.  Sweets and snack foods, such as cake, cookies, candy, chips, and soft drinks. How do I count carbohydrates? There are two ways to count carbohydrates in food. You can use either of the methods or a combination of both. Reading "Nutrition Facts" on packaged food The "Nutrition Facts" list is included on the labels of almost all packaged foods and beverages in the [place] It includes:  The serving size.  Information about nutrients in each serving, including the grams (g) of carbohydrate per serving. To use the "Nutrition Facts":  Decide how many servings you will have.  Multiply the number of servings by the number of carbohydrates per serving.  The resulting number is the total amount of carbohydrates that you will be having. Learning standard serving sizes of other foods When you eat carbohydrate foods that are not packaged or do not include "Nutrition Facts" on the label, you need to measure the servings in order to count the amount of carbohydrates:  Measure the foods that you will eat with a food scale or measuring cup, if  needed.  Decide how many standard-size servings you will eat.  Multiply the number of servings by 15. Most carbohydrate-rich foods have about 15 g of carbohydrates per serving. ? For example, if you eat 8 oz (170 g) of strawberries, you will have eaten 2 servings and 30 g of carbohydrates (2 servings x 15 g = 30 g).  For foods that have more than one food mixed, such as soups and casseroles, you must count the carbohydrates in each food that is included. The following list contains standard serving sizes of common carbohydrate-rich foods. Each of these servings has about 15 g of carbohydrates:   hamburger bun or  English muffin.   oz (15 mL) syrup.   oz (14 g) jelly.  1 slice of bread.  1 six-inch tortilla.  3 oz (85 g) cooked rice or pasta.  4 oz (113 g) cooked dried beans.  4 oz (113 g) starchy vegetable, such as peas, corn, or potatoes.  4 oz (113 g) hot cereal.  4 oz (113 g) mashed potatoes or  of a large baked potato.  4 oz (113 g) canned or frozen fruit.  4 oz (120 mL) fruit juice.  4-6 crackers.  6 chicken nuggets.  6 oz (170 g) unsweetened dry cereal.  6 oz (170 g) plain fat-free yogurt or yogurt sweetened with artificial sweeteners.  8 oz (240 mL) milk.  8 oz (170 g) fresh fruit or one small piece of fruit.  24 oz (680 g) popped popcorn. Example of carbohydrate counting Sample meal  3 oz (85 g) chicken breast.  6 oz (170 g)   brown rice.  4 oz (113 g) corn.  8 oz (240 mL) milk.  8 oz (170 g) strawberries with sugar-free whipped topping. Carbohydrate calculation 1. Identify the foods that contain carbohydrates: ? Rice. ? Corn. ? Milk. ? Strawberries. 2. Calculate how many servings you have of each food: ? 2 servings rice. ? 1 serving corn. ? 1 serving milk. ? 1 serving strawberries. 3. Multiply each number of servings by 15 g: ? 2 servings rice x 15 g = 30 g. ? 1 serving corn x 15 g = 15 g. ? 1 serving milk x 15 g = 15 g. ? 1  serving strawberries x 15 g = 15 g. 4. Add together all of the amounts to find the total grams of carbohydrates eaten: ? 30 g + 15 g + 15 g + 15 g = 75 g of carbohydrates total. Summary  Carbohydrate counting is a method of keeping track of how many carbohydrates you eat.  Eating carbohydrates naturally increases the amount of sugar (glucose) in the blood.  Counting how many carbohydrates you eat helps keep your blood glucose within normal limits, which helps you manage your diabetes.  A diet and nutrition specialist (registered dietitian) can help you make a meal plan and calculate how many carbohydrates you should have at each meal and snack. This information is not intended to replace advice given to you by your health care provider. Make sure you discuss any questions you have with your health care provider. Document Revised: 10/28/2016 Document Reviewed: 09/17/2015 Elsevier Patient Education  2020 [employer]  

## 2019-05-24 LAB — CBC WITH DIFFERENTIAL/PLATELET
Basophils Absolute: 0 10*3/uL (ref 0.0–0.2)
Basos: 1 %
EOS (ABSOLUTE): 0.1 10*3/uL (ref 0.0–0.4)
Eos: 1 %
Hematocrit: 42.5 % (ref 37.5–51.0)
Hemoglobin: 14.1 g/dL (ref 13.0–17.7)
Immature Grans (Abs): 0.1 10*3/uL (ref 0.0–0.1)
Immature Granulocytes: 1 %
Lymphocytes Absolute: 1.4 10*3/uL (ref 0.7–3.1)
Lymphs: 16 %
MCH: 28.1 pg (ref 26.6–33.0)
MCHC: 33.2 g/dL (ref 31.5–35.7)
MCV: 85 fL (ref 79–97)
Monocytes Absolute: 0.5 10*3/uL (ref 0.1–0.9)
Monocytes: 6 %
Neutrophils Absolute: 6.7 10*3/uL (ref 1.4–7.0)
Neutrophils: 75 %
Platelets: 236 10*3/uL (ref 150–450)
RBC: 5.01 x10E6/uL (ref 4.14–5.80)
RDW: 12.5 % (ref 11.6–15.4)
WBC: 8.8 10*3/uL (ref 3.4–10.8)

## 2019-05-24 LAB — CMP14+EGFR
ALT: 16 IU/L (ref 0–44)
AST: 16 IU/L (ref 0–40)
Albumin/Globulin Ratio: 1.7 (ref 1.2–2.2)
Albumin: 4.2 g/dL (ref 3.7–4.7)
Alkaline Phosphatase: 88 IU/L (ref 39–117)
BUN/Creatinine Ratio: 11 (ref 10–24)
BUN: 10 mg/dL (ref 8–27)
Bilirubin Total: 0.3 mg/dL (ref 0.0–1.2)
CO2: 23 mmol/L (ref 20–29)
Calcium: 9.6 mg/dL (ref 8.6–10.2)
Chloride: 105 mmol/L (ref 96–106)
Creatinine, Ser: 0.9 mg/dL (ref 0.76–1.27)
GFR calc Af Amer: 94 mL/min/{1.73_m2} (ref >59)
GFR calc non Af Amer: 82 mL/min/{1.73_m2} (ref >59)
Globulin, Total: 2.5 g/dL (ref 1.5–4.5)
Glucose: 147 mg/dL — ABNORMAL HIGH (ref 65–99)
Potassium: 4.1 mmol/L (ref 3.5–5.2)
Sodium: 144 mmol/L (ref 134–144)
Total Protein: 6.7 g/dL (ref 6.0–8.5)

## 2019-05-24 LAB — THYROID PANEL WITH TSH
Free Thyroxine Index: 3.2 (ref 1.2–4.9)
T3 Uptake Ratio: 31 % (ref 24–39)
T4, Total: 10.3 ug/dL (ref 4.5–12.0)
TSH: 1.51 u[IU]/mL (ref 0.450–4.500)

## 2019-05-28 ENCOUNTER — Other Ambulatory Visit: Payer: Self-pay | Admitting: Family Medicine

## 2019-05-28 ENCOUNTER — Telehealth: Payer: Self-pay | Admitting: Family Medicine

## 2019-05-28 MED ORDER — GLIMEPIRIDE 2 MG PO TABS: 2.0000 mg | ORAL_TABLET | Freq: Every day | ORAL | 3 refills | Status: DC

## 2019-05-28 NOTE — Telephone Encounter (Signed)
I sent in the requested prescription 

## 2019-06-21 DIAGNOSIS — C44321 Squamous cell carcinoma of skin of nose: Secondary | ICD-10-CM | POA: Diagnosis not present

## 2019-06-26 ENCOUNTER — Other Ambulatory Visit: Payer: Self-pay | Admitting: *Deleted

## 2019-06-26 MED ORDER — GLIMEPIRIDE 2 MG PO TABS: 2.0000 mg | ORAL_TABLET | Freq: Every day | ORAL | 0 refills | Status: DC

## 2019-07-26 DIAGNOSIS — L570 Actinic keratosis: Secondary | ICD-10-CM | POA: Diagnosis not present

## 2019-07-26 DIAGNOSIS — Z08 Encounter for follow-up examination after completed treatment for malignant neoplasm: Secondary | ICD-10-CM | POA: Diagnosis not present

## 2019-07-26 DIAGNOSIS — X32XXXD Exposure to sunlight, subsequent encounter: Secondary | ICD-10-CM | POA: Diagnosis not present

## 2019-07-26 DIAGNOSIS — Z85828 Personal history of other malignant neoplasm of skin: Secondary | ICD-10-CM | POA: Diagnosis not present

## 2019-08-06 ENCOUNTER — Other Ambulatory Visit: Payer: Self-pay | Admitting: *Deleted

## 2019-08-06 MED ORDER — METOPROLOL TARTRATE 50 MG PO TABS: 25.0000 mg | ORAL_TABLET | Freq: Two times a day (BID) | ORAL | 0 refills | Status: DC

## 2019-08-20 ENCOUNTER — Ambulatory Visit: Payer: Medicare PPO | Admitting: Family Medicine

## 2019-08-21 DIAGNOSIS — I4901 Ventricular fibrillation: Secondary | ICD-10-CM | POA: Insufficient documentation

## 2019-08-21 DIAGNOSIS — Z7189 Other specified counseling: Secondary | ICD-10-CM | POA: Insufficient documentation

## 2019-08-21 NOTE — Progress Notes (Signed)
Cardiology Office Note   Date:  08/22/2019   ID:  [name] [name], DOB [birth year], MRN [id number]  PCP:  Claretta Fraise, MD  Cardiologist:   Minus Breeding, MD   Chief Complaint  Patient presents with  . Coronary Artery Disease      History of Present Illness: [name] [name] is a 79 y.o. male who presents for follow up of multiple cardiovascular risk factors.  I saw him in Feb 2016 for evaluation of this same thing.  He has diabetes that is not well controlled.  He did have a POET (Plain Old Exercise Treadmill) in 2012 that was negative for ischemia but there was some brief atrial fib post test.  He had cardiac arrest in April 2020. cardiac catheterization showed severe proximal LAD stenosis and he underwent successful stenting and was extubated 08/23/2018. His hospital course complicated by atrial fibrillation with RVR maintained on aspirin Plavix as well as Eliquis and amiodarone.  He went to rehab after discharge  Since he was last seen he has done well. The patient denies any new symptoms such as chest discomfort, neck or arm discomfort. There has been no new shortness of breath, PND or orthopnea. There have been no reported palpitations, presyncope or syncope.  He is complaining of garden.  Is been active.  Past Medical History:  Diagnosis Date  . Basal cell carcinoma    left forehead, nose, neck  . CAD (coronary artery disease)    a. s/p DES to Prox-LAD and DES to LCx in 08/2018  . Cardiac arrest ([place])   . Cataract   . Diabetes mellitus without complication ([place])   . Diverticulitis   . Erectile dysfunction   . Gastroesophageal reflux disease with hiatal hernia   . Goiter, nontoxic, multinodular    with macrocyst  . Hiatal hernia    With reflex  . Hyperlipidemia   . Inguinal hernia    right side  . Kidney stone 1980  . Leg fracture    Left  . Liver hemangioma   . Nephrolithiasis   . Vitamin D deficiency     Past Surgical History:  Procedure Laterality Date  .  BACK SURGERY  1984  . CATARACT EXTRACTION Bilateral   . CORONARY STENT INTERVENTION N/A 08/25/2018   Procedure: CORONARY STENT INTERVENTION;  Surgeon: Sherren Mocha, MD;  Location: [facility];  Service: Cardiovascular;  Laterality: N/A;  . EYE SURGERY    . LACERATION REPAIR  2008   Dr. Lenon Curt -to hand  . LEFT HEART CATH AND CORONARY ANGIOGRAPHY N/A 08/25/2018   Procedure: LEFT HEART CATH AND CORONARY ANGIOGRAPHY;  Surgeon: Sherren Mocha, MD;  Location: [facility];  Service: Cardiovascular;  Laterality: N/A;  . TIBIA FRACTURE SURGERY     left leg     Current Outpatient Medications  Medication Sig Dispense Refill  . acetaminophen (TYLENOL) 325 MG tablet Take 2 tablets (650 mg total) by mouth every 4 (four) hours as needed for mild pain or headache.    Marland Kitchen amLODipine (NORVASC) 5 MG tablet Take 1 tablet (5 mg total) by mouth daily. 90 tablet 3  . atorvastatin (LIPITOR) 80 MG tablet Take 1 tablet (80 mg total) by mouth daily at 6 PM. 90 tablet 3  . Cholecalciferol (VITAMIN D3 MAXIMUM STRENGTH) 125 MCG (5000 UT) capsule Take 5,000 Units by mouth. Take one tablet three times weekly    . CINNAMON PO Take by mouth 2 (two) times daily.    Marland Kitchen  clopidogrel (PLAVIX) 75 MG tablet Take 1 tablet (75 mg total) by mouth daily with breakfast. 90 tablet 3  . fluticasone (FLONASE) 50 MCG/ACT nasal spray Place 2 sprays into both nostrils daily as needed for allergies.    . Garlic 916 MG TABS Take by mouth 2 (two) times daily.     Marland Kitchen glimepiride (AMARYL) 2 MG tablet Take 1 tablet (2 mg total) by mouth daily before breakfast. 90 tablet 0  . Insulin Detemir (LEVEMIR) 100 UNIT/ML Pen Inject 60 Units into the skin daily. 45 mL 3  . irbesartan (AVAPRO) 150 MG tablet Take 1 tablet (150 mg total) by mouth daily. 90 tablet 3  . levothyroxine (SYNTHROID) 75 MCG tablet Take 1 tablet (75 mcg total) by mouth daily. 90 tablet 3  . metFORMIN (GLUCOPHAGE) 1000 MG tablet Take 1 tablet (1,000 mg total) by mouth 2  (two) times daily with a meal. 180 tablet 3  . metoprolol tartrate (LOPRESSOR) 50 MG tablet Take 50 mg by mouth 2 (two) times daily.    . Accu-Chek FastClix Lancets MISC Check BS TID Dx E11.9 300 each 3  . aspirin EC 81 MG tablet Take 1 tablet (81 mg total) by mouth daily. 90 tablet 3  . Blood Glucose Monitoring Suppl (ACCU-CHEK AVIVA PLUS) w/Device KIT Check BS TID Dx E11.9 1 kit 0  . glucose blood (ACCU-CHEK AVIVA PLUS) test strip Check BS TID Dx E11.9 300 each 3   No current facility-administered medications for this visit.    Allergies:   Actos [pioglitazone hydrochloride], Lisinopril, Cymbalta [duloxetine hcl], Invokana [canagliflozin], Niaspan [niacin er], and Ramipril    ROS:  Please see the history of present illness.   Otherwise, review of systems are positive for none.   All other systems are reviewed and negative.    PHYSICAL EXAM: VS:  BP 140/78   Pulse 65   Ht '5\' 10"'$  (1.778 m)   Wt 168 lb (76.2 kg)   BMI 24.11 kg/m  , BMI Body mass index is 24.11 kg/m. GENERAL:  Well appearing NECK:  No jugular venous distention, waveform within normal limits, carotid upstroke brisk and symmetric, no bruits, no thyromegaly LUNGS:  Clear to auscultation bilaterally CHEST:  Unremarkable HEART:  PMI not displaced or sustained,S1 and S2 within normal limits, no S3, no S4, no clicks, no rubs, no murmurs ABD:  Flat, positive bowel sounds normal in frequency in pitch, no bruits, no rebound, no guarding, no midline pulsatile mass, no hepatomegaly, no splenomegaly EXT:  2 plus pulses throughout, no edema, no cyanosis no clubbing   EKG:  EKG is ordered today. Normal sinus rhythm, rate 65, axis within normal limits, intervals within normal limits, poor anterior R wave progression, possible old anteroseptal infarct.  Recent Labs: 08/30/2018: Magnesium 2.2 05/23/2019: ALT 16; BUN 10; Creatinine, Ser 0.90; Hemoglobin 14.1; Platelets 236; Potassium 4.1; Sodium 144; TSH 1.510    Lipid Panel     Component Value Date/Time   CHOL 82 (L) 02/20/2019 0808   CHOL 128 10/16/2012 1545   TRIG 58 02/20/2019 0808   TRIG 52 03/18/2016 1126   TRIG 102 10/16/2012 1545   HDL 38 (L) 02/20/2019 0808   HDL 53 03/18/2016 1126   HDL 45 10/16/2012 1545   CHOLHDL 2.2 02/20/2019 0808   LDLCALC 30 02/20/2019 0808   LDLCALC 66 12/13/2013 0842   LDLCALC 63 10/16/2012 1545      Wt Readings from Last 3 Encounters:  08/22/19 168 lb (76.2 kg)  05/23/19 169 lb (  76.7 kg)  05/23/19 166 lb 6.4 oz (75.5 kg)      Other studies Reviewed: Additional studies/ records that were reviewed today include:  Labs Review of the above records demonstrates:  Please see elsewhere in the note.     ASSESSMENT AND PLAN:  CAD He is doing well from a cardiovascular standpoint.  I would like for him to continue dual antiplatelet therapy given the severity of his presentation, the location of the stents.  I will be stopping the Eliquis as below.  I will be starting aspirin 81 mg daily.  He will continue the Plavix.  Paroxysmal Atrial Fibrillation This was only a post procedure fibrillation.  He is not having any symptoms to suggest continued paroxysms.   I think given this it is prudent to stop the Eliquis and continue with the aspirin and Plavix as above.   HTN BP is controlled.  No change in therapy.   HLD LDL was 69 with an HDL of 38.  No change in therapy.   Type 2 DM A1c was not controlled.  It was 9.3.  However, he had some med adjustments and he has follow-up I believe next week.  He understands the importance of good blood sugar control.   COVID EDUCATION:   He has had his vaccination.  Current medicines are reviewed at length with the patient today.  The patient does not have concerns regarding medicines.  The following changes have been made:  As above  Labs/ tests ordered today include: None   Orders Placed This Encounter  Procedures  . EKG 12-Lead     Disposition:  Follow up with me in 12  months.   Signed, Minus Breeding, MD  08/22/2019 12:52 PM    [facility]

## 2019-08-22 ENCOUNTER — Encounter: Payer: Self-pay | Admitting: Cardiology

## 2019-08-22 ENCOUNTER — Ambulatory Visit (INDEPENDENT_AMBULATORY_CARE_PROVIDER_SITE_OTHER): Payer: Medicare PPO | Admitting: Cardiology

## 2019-08-22 ENCOUNTER — Other Ambulatory Visit: Payer: Self-pay

## 2019-08-22 VITALS — BP 140/78 | HR 65 | Ht 70.0 in | Wt 168.0 lb

## 2019-08-22 DIAGNOSIS — I2510 Atherosclerotic heart disease of native coronary artery without angina pectoris: Secondary | ICD-10-CM

## 2019-08-22 DIAGNOSIS — I480 Paroxysmal atrial fibrillation: Secondary | ICD-10-CM | POA: Diagnosis not present

## 2019-08-22 DIAGNOSIS — Z7189 Other specified counseling: Secondary | ICD-10-CM

## 2019-08-22 DIAGNOSIS — E118 Type 2 diabetes mellitus with unspecified complications: Secondary | ICD-10-CM

## 2019-08-22 DIAGNOSIS — I4901 Ventricular fibrillation: Secondary | ICD-10-CM | POA: Diagnosis not present

## 2019-08-22 DIAGNOSIS — I10 Essential (primary) hypertension: Secondary | ICD-10-CM | POA: Diagnosis not present

## 2019-08-22 DIAGNOSIS — Z794 Long term (current) use of insulin: Secondary | ICD-10-CM

## 2019-08-22 DIAGNOSIS — E785 Hyperlipidemia, unspecified: Secondary | ICD-10-CM | POA: Diagnosis not present

## 2019-08-22 MED ORDER — ASPIRIN EC 81 MG PO TBEC: 81.0000 mg | DELAYED_RELEASE_TABLET | Freq: Every day | ORAL | 3 refills | Status: AC

## 2019-08-22 NOTE — Patient Instructions (Signed)
Medication Instructions:  Please discontinue your Eliquis. Start Aspirin 81 mg a day. Continue all other medications as listed.  *If you need a refill on your cardiac medications before your next appointment, please call your pharmacy*  Follow-Up: At [facility], you and your health needs are our priority.  As part of our continuing mission to provide you with exceptional heart care, we have created designated Provider Care Teams.  These Care Teams include your primary Cardiologist (physician) and Advanced Practice Providers (APPs -  Physician Assistants and Nurse Practitioners) who all work together to provide you with the care you need, when you need it.  We recommend signing up for the patient portal called "MyChart".  Sign up information is provided on this After Visit Summary.  MyChart is used to connect with patients for Virtual Visits (Telemedicine).  Patients are able to view lab/test results, encounter notes, upcoming appointments, etc.  Non-urgent messages can be sent to your provider as well.   To learn more about what you can do with MyChart, go to [email or web address].    Your next appointment:   12 month(s)  The format for your next appointment:   In Person  Provider:   Minus Breeding, MD   Thank you for choosing [facility]!!

## 2019-08-29 ENCOUNTER — Other Ambulatory Visit: Payer: Self-pay

## 2019-08-29 ENCOUNTER — Encounter: Payer: Self-pay | Admitting: Family Medicine

## 2019-08-29 ENCOUNTER — Ambulatory Visit: Payer: Medicare PPO | Admitting: Family Medicine

## 2019-08-29 VITALS — BP 134/74 | HR 69 | Temp 97.4°F | Resp 20 | Ht 70.0 in | Wt 165.4 lb

## 2019-08-29 DIAGNOSIS — Z794 Long term (current) use of insulin: Secondary | ICD-10-CM

## 2019-08-29 DIAGNOSIS — I10 Essential (primary) hypertension: Secondary | ICD-10-CM

## 2019-08-29 DIAGNOSIS — E034 Atrophy of thyroid (acquired): Secondary | ICD-10-CM

## 2019-08-29 DIAGNOSIS — C44311 Basal cell carcinoma of skin of nose: Secondary | ICD-10-CM

## 2019-08-29 DIAGNOSIS — E119 Type 2 diabetes mellitus without complications: Secondary | ICD-10-CM

## 2019-08-29 DIAGNOSIS — E782 Mixed hyperlipidemia: Secondary | ICD-10-CM | POA: Diagnosis not present

## 2019-08-29 LAB — BAYER DCA HB A1C WAIVED: HB A1C (BAYER DCA - WAIVED): 9.6 % — ABNORMAL HIGH (ref <7.0)

## 2019-08-29 MED ORDER — AMLODIPINE BESYLATE 5 MG PO TABS: 5.0000 mg | ORAL_TABLET | Freq: Every day | ORAL | 3 refills | Status: DC

## 2019-08-29 MED ORDER — CLOPIDOGREL BISULFATE 75 MG PO TABS: 75.0000 mg | ORAL_TABLET | Freq: Every day | ORAL | 3 refills | Status: DC

## 2019-08-29 MED ORDER — GLIMEPIRIDE 4 MG PO TABS: 4.0000 mg | ORAL_TABLET | Freq: Every day | ORAL | 1 refills | Status: DC

## 2019-08-29 MED ORDER — IRBESARTAN 150 MG PO TABS: 150.0000 mg | ORAL_TABLET | Freq: Every day | ORAL | 3 refills | Status: DC

## 2019-08-29 MED ORDER — OZEMPIC (0.25 OR 0.5 MG/DOSE) 2 MG/1.5ML ~~LOC~~ SOPN: 0.5000 mg | PEN_INJECTOR | SUBCUTANEOUS | 1 refills | Status: DC

## 2019-08-29 MED ORDER — LEVOTHYROXINE SODIUM 75 MCG PO TABS: 75.0000 ug | ORAL_TABLET | Freq: Every day | ORAL | 3 refills | Status: DC

## 2019-08-29 MED ORDER — ATORVASTATIN CALCIUM 80 MG PO TABS: 80.0000 mg | ORAL_TABLET | Freq: Every day | ORAL | 3 refills | Status: DC

## 2019-08-29 NOTE — Patient Instructions (Signed)
See Dermatologist ASAP to get cancer removed from the nose!  See [name] [name], ASAP to work on diabetes control   Carbohydrate Counting for Diabetes Mellitus, Adult  Carbohydrate counting is a method of keeping track of how many carbohydrates you eat. Eating carbohydrates naturally increases the amount of sugar (glucose) in the blood. Counting how many carbohydrates you eat helps keep your blood glucose within normal limits, which helps you manage your diabetes (diabetes mellitus). It is important to know how many carbohydrates you can safely have in each meal. This is different for every person. A diet and nutrition specialist (registered dietitian) can help you make a meal plan and calculate how many carbohydrates you should have at each meal and snack. Carbohydrates are found in the following foods:  Grains, such as breads and cereals.  Dried beans and soy products.  Starchy vegetables, such as potatoes, peas, and corn.  Fruit and fruit juices.  Milk and yogurt.  Sweets and snack foods, such as cake, cookies, candy, chips, and soft drinks. How do I count carbohydrates? There are two ways to count carbohydrates in food. You can use either of the methods or a combination of both. Reading "Nutrition Facts" on packaged food The "Nutrition Facts" list is included on the labels of almost all packaged foods and beverages in the [place] It includes:  The serving size.  Information about nutrients in each serving, including the grams (g) of carbohydrate per serving. To use the "Nutrition Facts":  Decide how many servings you will have.  Multiply the number of servings by the number of carbohydrates per serving.  The resulting number is the total amount of carbohydrates that you will be having. Learning standard serving sizes of other foods When you eat carbohydrate foods that are not packaged or do not include "Nutrition Facts" on the label, you need to measure the servings in order to  count the amount of carbohydrates:  Measure the foods that you will eat with a food scale or measuring cup, if needed.  Decide how many standard-size servings you will eat.  Multiply the number of servings by 15. Most carbohydrate-rich foods have about 15 g of carbohydrates per serving. ? For example, if you eat 8 oz (170 g) of strawberries, you will have eaten 2 servings and 30 g of carbohydrates (2 servings x 15 g = 30 g).  For foods that have more than one food mixed, such as soups and casseroles, you must count the carbohydrates in each food that is included. The following list contains standard serving sizes of common carbohydrate-rich foods. Each of these servings has about 15 g of carbohydrates:   hamburger bun or  English muffin.   oz (15 mL) syrup.   oz (14 g) jelly.  1 slice of bread.  1 six-inch tortilla.  3 oz (85 g) cooked rice or pasta.  4 oz (113 g) cooked dried beans.  4 oz (113 g) starchy vegetable, such as peas, corn, or potatoes.  4 oz (113 g) hot cereal.  4 oz (113 g) mashed potatoes or  of a large baked potato.  4 oz (113 g) canned or frozen fruit.  4 oz (120 mL) fruit juice.  4-6 crackers.  6 chicken nuggets.  6 oz (170 g) unsweetened dry cereal.  6 oz (170 g) plain fat-free yogurt or yogurt sweetened with artificial sweeteners.  8 oz (240 mL) milk.  8 oz (170 g) fresh fruit or one small piece of fruit.  24 oz (  680 g) popped popcorn. Example of carbohydrate counting Sample meal  3 oz (85 g) chicken breast.  6 oz (170 g) brown rice.  4 oz (113 g) corn.  8 oz (240 mL) milk.  8 oz (170 g) strawberries with sugar-free whipped topping. Carbohydrate calculation 1. Identify the foods that contain carbohydrates: ? Rice. ? Corn. ? Milk. ? Strawberries. 2. Calculate how many servings you have of each food: ? 2 servings rice. ? 1 serving corn. ? 1 serving milk. ? 1 serving strawberries. 3. Multiply each number of servings by 15  g: ? 2 servings rice x 15 g = 30 g. ? 1 serving corn x 15 g = 15 g. ? 1 serving milk x 15 g = 15 g. ? 1 serving strawberries x 15 g = 15 g. 4. Add together all of the amounts to find the total grams of carbohydrates eaten: ? 30 g + 15 g + 15 g + 15 g = 75 g of carbohydrates total. Summary  Carbohydrate counting is a method of keeping track of how many carbohydrates you eat.  Eating carbohydrates naturally increases the amount of sugar (glucose) in the blood.  Counting how many carbohydrates you eat helps keep your blood glucose within normal limits, which helps you manage your diabetes.  A diet and nutrition specialist (registered dietitian) can help you make a meal plan and calculate how many carbohydrates you should have at each meal and snack. This information is not intended to replace advice given to you by your health care provider. Make sure you discuss any questions you have with your health care provider. Document Revised: 10/28/2016 Document Reviewed: 09/17/2015 Elsevier Patient Education  [place].

## 2019-08-29 NOTE — Progress Notes (Signed)
Subjective:  Patient ID: [name] [name],  male    DOB: 10-25-1940  Age: 79 y.o.    CC: Medical Management of Chronic Issues   HPI [name] [name] presents for  follow-up of hypertension. Patient has no history of headache chest pain or shortness of breath or recent cough. Patient also denies symptoms of TIA such as numbness weakness lateralizing. Patient denies side effects from medication. States taking it regularly.  Patient also  in for follow-up of elevated cholesterol. Doing well without complaints on current medication. Denies side effects  including myalgia and arthralgia and nausea. Also in today for liver function testing. Currently no chest pain, shortness of breath or other cardiovascular related symptoms noted.  Follow-up of diabetes. Patient does not check blood sugar at home. Patient denies symptoms such as excessive hunger or urinary frequency, excessive hunger, nausea Two significant hypoglycemic spells noted. Medications reviewed. Pt reports taking them regularly. Pt. denies complication/adverse reaction today.    History [name] [name] has a past medical history of Basal cell carcinoma, CAD (coronary artery disease), Cardiac arrest ([place]), Cataract, Diabetes mellitus without complication ([place]), Diverticulitis, Erectile dysfunction, Gastroesophageal reflux disease with hiatal hernia, Goiter, nontoxic, multinodular, Hiatal hernia, Hyperlipidemia, Inguinal hernia, Kidney stone (1980), Leg fracture, Liver hemangioma, Nephrolithiasis, and Vitamin D deficiency.   He has a past surgical history that includes LEFT HEART CATH AND CORONARY ANGIOGRAPHY (N/A, 08/25/2018); CORONARY STENT INTERVENTION (N/A, 08/25/2018); Laceration repair (2008); Back surgery (1984); Tibia fracture surgery; Cataract extraction (Bilateral); and Eye surgery.   His family history includes Cancer in his father; Diabetes in an other family member; Early death (age of onset: 76) in his brother; Heart attack in his sister;  Heart disease in his brother; Heart disease (age of onset: 46) in his mother.He reports that he quit smoking about 36 years ago. His smoking use included cigarettes. He has a 27.00 pack-year smoking history. He has never used smokeless tobacco. He reports that he does not drink alcohol or use drugs.  Current Outpatient Medications on File Prior to Visit  Medication Sig Dispense Refill  . Accu-Chek FastClix Lancets MISC Check BS TID Dx E11.9 300 each 3  . acetaminophen (TYLENOL) 325 MG tablet Take 2 tablets (650 mg total) by mouth every 4 (four) hours as needed for mild pain or headache.    Marland Kitchen aspirin EC 81 MG tablet Take 1 tablet (81 mg total) by mouth daily. 90 tablet 3  . Blood Glucose Monitoring Suppl (ACCU-CHEK AVIVA PLUS) w/Device KIT Check BS TID Dx E11.9 1 kit 0  . Cholecalciferol (VITAMIN D3 MAXIMUM STRENGTH) 125 MCG (5000 UT) capsule Take 5,000 Units by mouth. Take one tablet three times weekly    . CINNAMON PO Take by mouth 2 (two) times daily.    . fluticasone (FLONASE) 50 MCG/ACT nasal spray Place 2 sprays into both nostrils daily as needed for allergies.    . Garlic 132 MG TABS Take by mouth 2 (two) times daily.     Marland Kitchen glucose blood (ACCU-CHEK AVIVA PLUS) test strip Check BS TID Dx E11.9 300 each 3  . Insulin Detemir (LEVEMIR) 100 UNIT/ML Pen Inject 60 Units into the skin daily. 45 mL 3  . metFORMIN (GLUCOPHAGE) 1000 MG tablet Take 1 tablet (1,000 mg total) by mouth 2 (two) times daily with a meal. 180 tablet 3  . metoprolol tartrate (LOPRESSOR) 50 MG tablet Take 50 mg by mouth 2 (two) times daily.     No current facility-administered medications on file prior to  visit.    ROS Review of Systems  Constitutional: Negative.   HENT: Negative.   Eyes: Negative for visual disturbance.  Respiratory: Negative for cough and shortness of breath.   Cardiovascular: Negative for chest pain and leg swelling.  Gastrointestinal: Negative for abdominal pain, diarrhea, nausea and vomiting.    Genitourinary: Negative for difficulty urinating.  Musculoskeletal: Negative for arthralgias and myalgias.  Skin: Negative for rash.  Neurological: Negative for headaches.  Psychiatric/Behavioral: Negative for sleep disturbance.    Objective:  BP 134/74   Pulse 69   Temp (!) 97.4 F (36.3 C) (Temporal)   Resp 20   Ht '5\' 10"'  (1.778 m)   Wt 165 lb 6 oz (75 kg)   SpO2 98%   BMI 23.73 kg/m   BP Readings from Last 3 Encounters:  08/29/19 134/74  08/22/19 140/78  05/23/19 (!) 144/76    Wt Readings from Last 3 Encounters:  08/29/19 165 lb 6 oz (75 kg)  08/22/19 168 lb (76.2 kg)  05/23/19 169 lb (76.7 kg)     Physical Exam Vitals reviewed.  Constitutional:      Appearance: He is well-developed.  HENT:     Head: Normocephalic and atraumatic.     Right Ear: Tympanic membrane and external ear normal. No decreased hearing noted.     Left Ear: Tympanic membrane and external ear normal. No decreased hearing noted.     Mouth/Throat:     Pharynx: No oropharyngeal exudate or posterior oropharyngeal erythema.  Eyes:     Pupils: Pupils are equal, round, and reactive to light.  Cardiovascular:     Rate and Rhythm: Normal rate and regular rhythm.     Heart sounds: No murmur.  Pulmonary:     Effort: No respiratory distress.     Breath sounds: Normal breath sounds.  Abdominal:     General: Bowel sounds are normal.     Palpations: Abdomen is soft. There is no mass.     Tenderness: There is no abdominal tenderness.  Musculoskeletal:     Cervical back: Normal range of motion and neck supple.  Skin:    General: Skin is warm.     Findings: Lesion (Left side of his nose.) present.     Diabetic Foot Exam - Simple   Simple Foot Form Diabetic Foot exam was performed with the following findings: Yes 08/29/2019 10:00 AM  Visual Inspection No deformities, no ulcerations, no other skin breakdown bilaterally: Yes Sensation Testing Intact to touch and monofilament testing bilaterally:  Yes Pulse Check Posterior Tibialis and Dorsalis pulse intact bilaterally: Yes Comments      Results for orders placed or performed in visit on 08/29/19  Bayer DCA Hb A1c Waived  Result Value Ref Range   HB A1C (BAYER DCA - WAIVED) 9.6 (H) <7.0 %      Assessment & Plan:   [name] [name] was seen today for medical management of chronic issues.  Diagnoses and all orders for this visit:  Essential hypertension -     CBC with Differential/Platelet -     CMP14+EGFR -     Lipid panel -     irbesartan (AVAPRO) 150 MG tablet; Take 1 tablet (150 mg total) by mouth daily.  Mixed hyperlipidemia -     CBC with Differential/Platelet -     CMP14+EGFR -     Lipid panel  Hypothyroidism due to acquired atrophy of thyroid -     CBC with Differential/Platelet -     CMP14+EGFR -  Lipid panel -     Thyroid Panel With TSH  Type 2 diabetes mellitus treated with insulin ([facility]) -     Bayer DCA Hb A1c Waived -     CBC with Differential/Platelet -     CMP14+EGFR -     Lipid panel -     irbesartan (AVAPRO) 150 MG tablet; Take 1 tablet (150 mg total) by mouth daily.  Other orders -     Semaglutide,0.25 or 0.5MG/DOS, (OZEMPIC, 0.25 OR 0.5 MG/DOSE,) 2 MG/1.5ML SOPN; Inject 0.5 mg into the skin once a week. -     amLODipine (NORVASC) 5 MG tablet; Take 1 tablet (5 mg total) by mouth daily. -     atorvastatin (LIPITOR) 80 MG tablet; Take 1 tablet (80 mg total) by mouth daily at 6 PM. -     clopidogrel (PLAVIX) 75 MG tablet; Take 1 tablet (75 mg total) by mouth daily with breakfast. -     levothyroxine (SYNTHROID) 75 MCG tablet; Take 1 tablet (75 mcg total) by mouth daily. -     glimepiride (AMARYL) 4 MG tablet; Take 1 tablet (4 mg total) by mouth daily before breakfast.   I have changed [name] [name]. [name] [name] glimepiride. I am also having him start on Ozempic (0.25 or 0.5 MG/DOSE). Additionally, I am having him maintain his fluticasone, acetaminophen, Cholecalciferol, Garlic, Accu-Chek Aviva Plus, Accu-Chek  Aviva Plus, Accu-Chek FastClix Lancets, metFORMIN, insulin detemir, metoprolol tartrate, CINNAMON PO, aspirin EC, amLODipine, atorvastatin, clopidogrel, levothyroxine, and irbesartan.  Meds ordered this encounter  Medications  . Semaglutide,0.25 or 0.5MG/DOS, (OZEMPIC, 0.25 OR 0.5 MG/DOSE,) 2 MG/1.5ML SOPN    Sig: Inject 0.5 mg into the skin once a week.    Dispense:  1 pen    Refill:  1  . amLODipine (NORVASC) 5 MG tablet    Sig: Take 1 tablet (5 mg total) by mouth daily.    Dispense:  90 tablet    Refill:  3  . atorvastatin (LIPITOR) 80 MG tablet    Sig: Take 1 tablet (80 mg total) by mouth daily at 6 PM.    Dispense:  90 tablet    Refill:  3  . clopidogrel (PLAVIX) 75 MG tablet    Sig: Take 1 tablet (75 mg total) by mouth daily with breakfast.    Dispense:  90 tablet    Refill:  3  . levothyroxine (SYNTHROID) 75 MCG tablet    Sig: Take 1 tablet (75 mcg total) by mouth daily.    Dispense:  90 tablet    Refill:  3  . irbesartan (AVAPRO) 150 MG tablet    Sig: Take 1 tablet (150 mg total) by mouth daily.    Dispense:  90 tablet    Refill:  3  . glimepiride (AMARYL) 4 MG tablet    Sig: Take 1 tablet (4 mg total) by mouth daily before breakfast.    Dispense:  90 tablet    Refill:  1   Patient will see Lottie Dawson our clinical pharmacist.  Hopefully she can help him with diet and medication adjustment and in turn bring his A1c into line.  Patient was strongly encouraged to go back to his dermatologist for yet another excision of this lesion of the nose that has been a basal cell from past removal biopsy report..  Follow-up: Return in about 3 months (around 11/29/2019).  Claretta Fraise, M.D.

## 2019-08-30 ENCOUNTER — Ambulatory Visit (INDEPENDENT_AMBULATORY_CARE_PROVIDER_SITE_OTHER): Payer: Medicare PPO | Admitting: Pharmacist

## 2019-08-30 DIAGNOSIS — Z794 Long term (current) use of insulin: Secondary | ICD-10-CM | POA: Diagnosis not present

## 2019-08-30 DIAGNOSIS — E119 Type 2 diabetes mellitus without complications: Secondary | ICD-10-CM

## 2019-08-30 LAB — CMP14+EGFR
ALT: 17 IU/L (ref 0–44)
AST: 17 IU/L (ref 0–40)
Albumin/Globulin Ratio: 1.6 (ref 1.2–2.2)
Albumin: 4.1 g/dL (ref 3.7–4.7)
Alkaline Phosphatase: 85 IU/L (ref 39–117)
BUN/Creatinine Ratio: 10 (ref 10–24)
BUN: 12 mg/dL (ref 8–27)
Bilirubin Total: 0.4 mg/dL (ref 0.0–1.2)
CO2: 23 mmol/L (ref 20–29)
Calcium: 9.4 mg/dL (ref 8.6–10.2)
Chloride: 103 mmol/L (ref 96–106)
Creatinine, Ser: 1.2 mg/dL (ref 0.76–1.27)
GFR calc Af Amer: 67 mL/min/{1.73_m2} (ref >59)
GFR calc non Af Amer: 58 mL/min/{1.73_m2} — ABNORMAL LOW (ref >59)
Globulin, Total: 2.5 g/dL (ref 1.5–4.5)
Glucose: 197 mg/dL — ABNORMAL HIGH (ref 65–99)
Potassium: 4.1 mmol/L (ref 3.5–5.2)
Sodium: 139 mmol/L (ref 134–144)
Total Protein: 6.6 g/dL (ref 6.0–8.5)

## 2019-08-30 LAB — CBC WITH DIFFERENTIAL/PLATELET
Basophils Absolute: 0.1 10*3/uL (ref 0.0–0.2)
Basos: 1 %
EOS (ABSOLUTE): 0.1 10*3/uL (ref 0.0–0.4)
Eos: 1 %
Hematocrit: 41.1 % (ref 37.5–51.0)
Hemoglobin: 13.4 g/dL (ref 13.0–17.7)
Immature Grans (Abs): 0 10*3/uL (ref 0.0–0.1)
Immature Granulocytes: 0 %
Lymphocytes Absolute: 1.5 10*3/uL (ref 0.7–3.1)
Lymphs: 16 %
MCH: 27.3 pg (ref 26.6–33.0)
MCHC: 32.6 g/dL (ref 31.5–35.7)
MCV: 84 fL (ref 79–97)
Monocytes Absolute: 0.9 10*3/uL (ref 0.1–0.9)
Monocytes: 10 %
Neutrophils Absolute: 6.9 10*3/uL (ref 1.4–7.0)
Neutrophils: 72 %
Platelets: 228 10*3/uL (ref 150–450)
RBC: 4.91 x10E6/uL (ref 4.14–5.80)
RDW: 13.7 % (ref 11.6–15.4)
WBC: 9.5 10*3/uL (ref 3.4–10.8)

## 2019-08-30 LAB — LIPID PANEL
Chol/HDL Ratio: 2.2 ratio (ref 0.0–5.0)
Cholesterol, Total: 74 mg/dL — ABNORMAL LOW (ref 100–199)
HDL: 33 mg/dL — ABNORMAL LOW (ref >39)
LDL Chol Calc (NIH): 24 mg/dL (ref 0–99)
Triglycerides: 82 mg/dL (ref 0–149)
VLDL Cholesterol Cal: 17 mg/dL (ref 5–40)

## 2019-08-30 LAB — THYROID PANEL WITH TSH
Free Thyroxine Index: 2.5 (ref 1.2–4.9)
T3 Uptake Ratio: 29 % (ref 24–39)
T4, Total: 8.5 ug/dL (ref 4.5–12.0)
TSH: 1.46 u[IU]/mL (ref 0.450–4.500)

## 2019-08-30 MED ORDER — OZEMPIC (0.25 OR 0.5 MG/DOSE) 2 MG/1.5ML ~~LOC~~ SOPN: 0.5000 mg | PEN_INJECTOR | SUBCUTANEOUS | 1 refills | Status: DC

## 2019-08-30 NOTE — Progress Notes (Addendum)
    08/30/2019 Name: EMBERT MESEROLE MRN: [id number] DOB: 1941/03/28  Referred by: Claretta Fraise, MD Reason for referral : Diabetes  S:   27yoM presents for diabetes evaluation, education, and management Patient was referred and last seen by Primary Care Provider on 08/29/19. Insurance coverage/medication affordability: Psychologist, prison and probation services (uses health dept for brand name/expensive medications)   Patient denies adherence with medications.  Current diabetes medications include: levemir 60 units (gets at [place] [facility]), metformin, glimepiride  Current hypertension medications include: amlodipine, irbesartan, metoprolol  Current hyperlipidemia medications include: atorvastatin   Patient reports hypoglycemic events about 4 times per month (BG<70 when exercise/works in the yard & doesn't eat full meal)  Patient reported dietary habits: Eats 2-3 meals/day Breakfast--will sometimes eat cereal w/ milk, Bacon Lunch-pack of crackers Dinner-protein/meat, veggie, carbo  Patient-reported exercise habits: works in the yard/gardening  O:  Lab Results  Component Value Date   HGBA1C 9.6 (H) 08/29/2019  Lipid Panel     Component Value Date/Time   CHOL 74 (L) 08/29/2019 0944   CHOL 128 10/16/2012 1545   TRIG 82 08/29/2019 0944   TRIG 52 03/18/2016 1126   TRIG 102 10/16/2012 1545   HDL 33 (L) 08/29/2019 0944   HDL 53 03/18/2016 1126   HDL 45 10/16/2012 1545   CHOLHDL 2.2 08/29/2019 0944   LDLCALC 24 08/29/2019 0944   LDLCALC 66 12/13/2013 0842   LDLCALC 63 10/16/2012 1545   Home fasting blood sugars: 180-200  2 hour post-meal/random blood sugars: n/a.  A/P:  Diabetes T2DM currently uncontrolled--A1c 9.6%. Patient is able to verbalize appropriate hypoglycemia management plan. Patient is adherent with medication. Control is suboptimal due to diet/lifestyle (medications not optimized), cost of medications.  -Decreased dose of basal insulin Levemir (insulin : detemir) to 50 units  while titrating GLP1  -Discontinue glimepiride  -Started GLP-1 OZEMPIC (generic name: semaglutide) to 0.25mg  sq weekly for 4 weeks then increase to 0.5mg  sq weekly thereafter.   -Ozempic sample #1 pen EXP  5/23, [id number]  -WILL SEND [place] TO  HEALTH DEPT (faxed)  -Extensively discussed pathophysiology of diabetes, recommended lifestyle interventions, dietary effects on blood sugar control  -Counseled on s/sx of and management of hypoglycemia  -Next A1C anticipated August 2021    Written patient instructions provided.  Total time in face to face counseling 25 minutes.   Follow up Pharmacist TELEPHONE VISIT in 2 weeks    Regina Eck, PharmD, BCPS Clinical Pharmacist, [place]  [facility] Phone [phone]

## 2019-09-04 ENCOUNTER — Telehealth: Payer: Self-pay | Admitting: Pharmacist

## 2019-09-04 NOTE — Telephone Encounter (Signed)
Diabetes T2DM currently uncontrolled--A1c 9.6%. Patient is able to verbalize appropriate hypoglycemia management plan. Patient is adherent with medication. Control is suboptimal due to diet/lifestyle (medications not optimized), cost of medications.  -Decreased dose of basal insulin Levemir (insulin : detemir) to 50 units while titrating GLP1  -Discontinued glimepiride  -Started GLP-1 OZEMPIC (generic name: semaglutide) to 0.25mg  sq weekly for 4 weeks then increase to 0.5mg  sq weekly thereafter.              -Ozempic sample #1 pen EXP 5/23, [id number]             -OZEMPIC RX sent TO HEALTH DEPT (faxed)  Patient reports FBG <180, but >140 DENIES hypoglycemia  Encouraged 3 steady meals/day Encouraged patient to call if needs arise and for BG<70 or >250 Will continue to adjust medications

## 2019-09-13 DIAGNOSIS — L570 Actinic keratosis: Secondary | ICD-10-CM | POA: Diagnosis not present

## 2019-09-13 DIAGNOSIS — C44321 Squamous cell carcinoma of skin of nose: Secondary | ICD-10-CM | POA: Diagnosis not present

## 2019-09-13 DIAGNOSIS — X32XXXD Exposure to sunlight, subsequent encounter: Secondary | ICD-10-CM | POA: Diagnosis not present

## 2019-09-20 ENCOUNTER — Telehealth: Payer: Self-pay | Admitting: Family Medicine

## 2019-09-21 NOTE — Telephone Encounter (Signed)
-  Re-faxed health dept prescription: Ozempic (successful) -Sample left in refrigerator (Ozempic)--continue 0.25mg  weekly -DZH#[id number] -EXP 10/23 -PT FBG IN GOAL RANGE 90-130 -DENIES HYPOGLYCEMIA

## 2019-10-04 ENCOUNTER — Ambulatory Visit (INDEPENDENT_AMBULATORY_CARE_PROVIDER_SITE_OTHER): Payer: Medicare PPO | Admitting: *Deleted

## 2019-10-04 ENCOUNTER — Encounter: Payer: Self-pay | Admitting: *Deleted

## 2019-10-04 DIAGNOSIS — Z0000 Encounter for general adult medical examination without abnormal findings: Secondary | ICD-10-CM

## 2019-10-04 NOTE — Progress Notes (Signed)
MEDICARE ANNUAL WELLNESS VISIT  10/04/2019  Telephone Visit Disclaimer This Medicare AWV was conducted by telephone due to national recommendations for restrictions regarding the COVID-19 Pandemic (e.g. social distancing).  I verified, using two identifiers, that I am speaking with [name] [name] or their authorized healthcare agent. I discussed the limitations, risks, security, and privacy concerns of performing an evaluation and management service by telephone and the potential availability of an in-person appointment in the future. The patient expressed understanding and agreed to proceed.   Subjective:  [name] [name] is a 79 y.o. male patient of Stacks, Cletus Gash, MD who had a Medicare Annual Wellness Visit today via telephone. [name] [name] is Retired and lives with their spouse. he has 1 living child. he reports that he is socially active and does interact with friends/family regularly. he is not physically active and enjoys gardening.  Patient Care Team: Claretta Fraise, MD as PCP - General (Family Medicine) Minus Breeding, MD as PCP - Cardiology (Cardiology) Calvert Cantor, MD as Consulting Physician (Ophthalmology) Minus Breeding, MD as Consulting Physician (Cardiology)  Advanced Directives 10/04/2019 09/20/2018 08/30/2018 08/26/2018 12/01/2015 11/04/2014 10/30/2013  Does Patient Have a Medical Advance Directive? No Yes No No No No Patient would like information  Type of Advance Directive - Story;Living will - - - - -  Does patient want to make changes to medical advance directive? - No - Patient declined - - - - -  Copy of [place] in Chart? - No - copy requested - - - - -  Would patient like information on creating a medical advance directive? No - Patient declined No - Patient declined No - Patient declined No - Patient declined;No - Guardian declined No - patient declined information No - patient declined information Advance directive packet given    Pre-existing out of facility DNR order (yellow form or pink MOST form) - - - - - - No    Hospital Utilization Over the Past 12 Months: # of hospitalizations or ER visits: 0 # of surgeries: 0  Review of Systems    Patient reports that his overall health is worse compared to last year.  History obtained from chart review and the patient  Patient Reported Readings (BP, Pulse, CBG, Weight, etc) 117 CBG  Pain Assessment Pain : No/denies pain     Current Medications & Allergies (verified) Allergies as of 10/04/2019      Reactions   Actos [pioglitazone Hydrochloride] Swelling   Fluid retention   Lisinopril Rash   Cymbalta [duloxetine Hcl] Other (See Comments)   Weakness. Legs likerubber, can't walk   Invokana [canagliflozin] Rash   Niaspan [niacin Er] Other (See Comments)   Increased blood sugar   Ramipril Diarrhea      Medication List       Accurate as of October 04, 2019 10:09 AM. If you have any questions, ask your nurse or doctor.        Accu-Chek Aviva Plus test strip Generic drug: glucose blood Check BS TID Dx E11.9   Accu-Chek Aviva Plus w/Device Kit Check BS TID Dx E11.9   Accu-Chek FastClix Lancets Misc Check BS TID Dx E11.9   acetaminophen 325 MG tablet Commonly known as: TYLENOL Take 2 tablets (650 mg total) by mouth every 4 (four) hours as needed for mild pain or headache.   amLODipine 5 MG tablet Commonly known as: NORVASC Take 1 tablet (5 mg total) by mouth daily.   aspirin EC 81  MG tablet Take 1 tablet (81 mg total) by mouth daily.   atorvastatin 80 MG tablet Commonly known as: LIPITOR Take 1 tablet (80 mg total) by mouth daily at 6 PM.   CINNAMON PO Take by mouth 2 (two) times daily.   clopidogrel 75 MG tablet Commonly known as: PLAVIX Take 1 tablet (75 mg total) by mouth daily with breakfast.   fluticasone 50 MCG/ACT nasal spray Commonly known as: FLONASE Place 2 sprays into both nostrils daily as needed for allergies.   Garlic 825  MG Tabs Take by mouth 2 (two) times daily.   insulin detemir 100 UNIT/ML FlexPen Commonly known as: LEVEMIR Inject 60 Units into the skin daily. What changed: how much to take   irbesartan 150 MG tablet Commonly known as: Avapro Take 1 tablet (150 mg total) by mouth daily.   levothyroxine 75 MCG tablet Commonly known as: SYNTHROID Take 1 tablet (75 mcg total) by mouth daily.   metFORMIN 1000 MG tablet Commonly known as: GLUCOPHAGE Take 1 tablet (1,000 mg total) by mouth 2 (two) times daily with a meal.   metoprolol tartrate 50 MG tablet Commonly known as: LOPRESSOR Take 50 mg by mouth 2 (two) times daily.   Ozempic (0.25 or 0.5 MG/DOSE) 2 MG/1.5ML Sopn Generic drug: Semaglutide(0.25 or 0.5MG/DOS) Inject 0.5 mg into the skin once a week.   Vitamin D3 Maximum Strength 125 MCG (5000 UT) capsule Generic drug: Cholecalciferol Take 5,000 Units by mouth. Take one tablet three times weekly       History (reviewed): Past Medical History:  Diagnosis Date  . Basal cell carcinoma    left forehead, nose, neck  . CAD (coronary artery disease)    a. s/p DES to Prox-LAD and DES to LCx in 08/2018  . Cardiac arrest ([place])   . Cataract   . Diabetes mellitus without complication ([place])   . Diverticulitis   . Erectile dysfunction   . Gastroesophageal reflux disease with hiatal hernia   . Goiter, nontoxic, multinodular    with macrocyst  . Hiatal hernia    With reflex  . Hyperlipidemia   . Inguinal hernia    right side  . Kidney stone 1980  . Leg fracture    Left  . Liver hemangioma   . Nephrolithiasis   . Vitamin D deficiency    Past Surgical History:  Procedure Laterality Date  . BACK SURGERY  1984  . CATARACT EXTRACTION Bilateral   . CORONARY STENT INTERVENTION N/A 08/25/2018   Procedure: CORONARY STENT INTERVENTION;  Surgeon: Sherren Mocha, MD;  Location: [facility];  Service: Cardiovascular;  Laterality: N/A;  . EYE SURGERY    . LACERATION REPAIR  2008   Dr.  Lenon Curt -to hand  . LEFT HEART CATH AND CORONARY ANGIOGRAPHY N/A 08/25/2018   Procedure: LEFT HEART CATH AND CORONARY ANGIOGRAPHY;  Surgeon: Sherren Mocha, MD;  Location: [facility];  Service: Cardiovascular;  Laterality: N/A;  . TIBIA FRACTURE SURGERY     left leg   Family History  Problem Relation Age of Onset  . Heart attack Sister        Sudden death in her 35s.  No clear as to the cause  . Heart disease Brother        Valvular  . Heart disease Mother 57       stent, lived to be 68  . Cancer Father        ?lung  . Early death Brother 55  spinal menigitis  . Diabetes Other    Social History   Socioeconomic History  . Marital status: Married    Spouse name: Not on file  . Number of children: 1  . Years of education: Not on file  . Highest education level: 11th grade  Occupational History  . Occupation: Retired  Tobacco Use  . Smoking status: Former Smoker    Packs/day: 1.00    Years: 27.00    Pack years: 27.00    Types: Cigarettes    Quit date: 04/20/1983    Years since quitting: 36.4  . Smokeless tobacco: Never Used  . Tobacco comment: Quit 25 years ago  Vaping Use  . Vaping Use: Never used  Substance and Sexual Activity  . Alcohol use: No  . Drug use: No  . Sexual activity: Yes  Other Topics Concern  . Not on file  Social History Narrative   ** Merged History Encounter **       Married.  Lives with wife.  Grandson stays with them some.     Social Determinants of Health   Financial Resource Strain:   . Difficulty of Paying Living Expenses:   Food Insecurity:   . Worried About Charity fundraiser in the Last Year:   . Arboriculturist in the Last Year:   Transportation Needs:   . Film/video editor (Medical):   Marland Kitchen Lack of Transportation (Non-Medical):   Physical Activity:   . Days of Exercise per Week:   . Minutes of Exercise per Session:   Stress:   . Feeling of Stress :   Social Connections:   . Frequency of Communication with Friends  and Family:   . Frequency of Social Gatherings with Friends and Family:   . Attends Religious Services:   . Active Member of Clubs or Organizations:   . Attends Archivist Meetings:   Marland Kitchen Marital Status:     Activities of Daily Living In your present state of health, do you have any difficulty performing the following activities: 10/04/2019  Hearing? N  Vision? N  Difficulty concentrating or making decisions? N  Walking or climbing stairs? N  Dressing or bathing? N  Doing errands, shopping? N  Preparing Food and eating ? N  Using the Toilet? N  In the past six months, have you accidently leaked urine? N  Do you have problems with loss of bowel control? N  Managing your Medications? N  Managing your Finances? N  Housekeeping or managing your Housekeeping? N  Some recent data might be hidden    Patient Education/ Literacy How often do you need to have someone help you when you read instructions, pamphlets, or other written materials from your doctor or pharmacy?: 1 - Never What is the last grade level you completed in school?: 11  Exercise Current Exercise Habits: The patient does not participate in regular exercise at present, Exercise limited by: None identified  Diet Patient reports consuming 2 meals a day and 2 snack(s) a day Patient reports that his primary diet is: Low fat Patient reports that she does have regular access to food.   Depression Screen PHQ 2/9 Scores 10/04/2019 05/23/2019 02/20/2019 09/20/2018 06/07/2018 01/25/2018 10/10/2017  PHQ - 2 Score 0 0 0 0 0 0 0  PHQ- 9 Score - - - - - - -     Fall Risk Fall Risk  10/04/2019 08/29/2019 05/23/2019 02/20/2019 09/20/2018  Falls in the past year? '1 1 1 ' 1  0  Number falls in past yr: '1 1 1 1 ' 0  Injury with Fall? 0 0 0 0 0  Risk for fall due to : History of fall(s);Medication side effect History of fall(s) History of fall(s);Impaired balance/gait History of fall(s) -  Follow up Falls prevention discussed Falls evaluation  completed Falls evaluation completed;Education provided Falls prevention discussed -     Objective:  AMAAR OSHITA seemed alert and oriented and he participated appropriately during our telephone visit.  Blood Pressure Weight BMI  BP Readings from Last 3 Encounters:  08/29/19 134/74  08/22/19 140/78  05/23/19 (!) 144/76   Wt Readings from Last 3 Encounters:  08/29/19 165 lb 6 oz (75 kg)  08/22/19 168 lb (76.2 kg)  05/23/19 169 lb (76.7 kg)   BMI Readings from Last 1 Encounters:  08/29/19 23.73 kg/m    *Unable to obtain current vital signs, weight, and BMI due to telephone visit type  Hearing/Vision  . [name] [name] did not seem to have difficulty with hearing/understanding during the telephone conversation . Reports that he has not had a formal eye exam by an eye care professional within the past year . Reports that he has not had a formal hearing evaluation within the past year *Unable to fully assess hearing and vision during telephone visit type  Cognitive Function: 6CIT Screen 10/04/2019 09/20/2018  What Year? 0 points 0 points  What month? 0 points 0 points  What time? 0 points 0 points  Count back from 20 2 points 0 points  Months in reverse 2 points 0 points  Repeat phrase 0 points 0 points  Total Score 4 0   (Normal:0-7, Significant for Dysfunction: >8)  Normal Cognitive Function Screening: Yes   Immunization & Health Maintenance Record Immunization History  Administered Date(s) Administered  . Fluad Quad(high Dose 65+) 02/20/2019  . Influenza Whole 01/17/2010  . Influenza, High Dose Seasonal PF 01/31/2015, 02/18/2016, 02/16/2017, 01/25/2018  . Influenza,inj,Quad PF,6+ Mos 02/06/2014  . Influenza-Unspecified 02/17/2013  . Pneumococcal Conjugate-13 04/02/2013  . Pneumococcal Polysaccharide-23 02/18/2008  . Td 12/19/2006, 02/08/2011, 04/28/2017  . Tdap 02/08/2011  . Zoster 10/28/2010    Health Maintenance  Topic Date Due  . Hepatitis C Screening  Never done  .  COVID-19 Vaccine (1) Never done  . OPHTHALMOLOGY EXAM  02/24/2019  . INFLUENZA VACCINE  11/18/2019  . HEMOGLOBIN A1C  02/29/2020  . FOOT EXAM  08/28/2020  . TETANUS/TDAP  04/29/2027  . PNA vac Low Risk Adult  Completed       Assessment  This is a routine wellness examination for [name] [name].  Health Maintenance: Due or Overdue Health Maintenance Due  Topic Date Due  . Hepatitis C Screening  Never done  . COVID-19 Vaccine (1) Never done  . OPHTHALMOLOGY EXAM  02/24/2019    [name] [name] does not need a referral for Community Assistance: Care Management:   no Social Work:    no Prescription Assistance:  no Nutrition/Diabetes Education:  no   Plan:  Personalized Goals Goals Addressed            This Visit's Progress   . Exercise 3x per week (30 min per time)   Not on track   . Prevent falls       Pt fell over 10 times last year due to a medication causing dizziness, problem solved now.      Personalized Health Maintenance & Screening Recommendations  up to date  Lung Cancer Screening Recommended: no (Low  Dose CT Chest recommended if Age 56-80 years, 30 pack-year currently smoking OR have quit w/in past 15 years) Hepatitis C Screening recommended: no HIV Screening recommended: no  Advanced Directives: Written information was not prepared per patient's request.  Referrals & Orders No orders of the defined types were placed in this encounter.   Follow-up Plan . Follow-up with Claretta Fraise, MD as planned on 11/28/19. . Pt has upcoming eye appt in August with Dr. Bing Plume. . No difficulties with hearing. . Covid vaccine complete, all other vaccines up to date. Pt considering shingrix vaccine. . Since patients cardiac arrest in 07/2018, he has been less active due to energy but he is very independent with all ADL's. . Pt voices no healthcare concerns, he will call me back today with covid vaccine dates and his current metoprolol dosage. . AVS printed and  mailed to pt.     I have personally reviewed and noted the following in the patient's chart:   . Medical and social history . Use of alcohol, tobacco or illicit drugs  . Current medications and supplements . Functional ability and status . Nutritional status . Physical activity . Advanced directives . List of other physicians . Hospitalizations, surgeries, and ER visits in previous 12 months . Vitals . Screenings to include cognitive, depression, and falls . Referrals and appointments  In addition, I have reviewed and discussed with [name] [name] certain preventive protocols, quality metrics, and best practice recommendations. A written personalized care plan for preventive services as well as general preventive health recommendations is available and can be mailed to the patient at his request.      Rana Snare, LPN  [id number]

## 2019-10-11 DIAGNOSIS — Z08 Encounter for follow-up examination after completed treatment for malignant neoplasm: Secondary | ICD-10-CM | POA: Diagnosis not present

## 2019-10-11 DIAGNOSIS — L570 Actinic keratosis: Secondary | ICD-10-CM | POA: Diagnosis not present

## 2019-10-11 DIAGNOSIS — X32XXXD Exposure to sunlight, subsequent encounter: Secondary | ICD-10-CM | POA: Diagnosis not present

## 2019-10-11 DIAGNOSIS — Z85828 Personal history of other malignant neoplasm of skin: Secondary | ICD-10-CM | POA: Diagnosis not present

## 2019-11-28 ENCOUNTER — Encounter: Payer: Self-pay | Admitting: Family Medicine

## 2019-11-28 ENCOUNTER — Ambulatory Visit: Payer: Medicare PPO | Admitting: Family Medicine

## 2019-11-28 ENCOUNTER — Other Ambulatory Visit: Payer: Self-pay

## 2019-11-28 VITALS — BP 123/73 | HR 62 | Temp 97.4°F | Ht 70.0 in | Wt 161.2 lb

## 2019-11-28 DIAGNOSIS — E782 Mixed hyperlipidemia: Secondary | ICD-10-CM

## 2019-11-28 DIAGNOSIS — E119 Type 2 diabetes mellitus without complications: Secondary | ICD-10-CM | POA: Diagnosis not present

## 2019-11-28 DIAGNOSIS — E034 Atrophy of thyroid (acquired): Secondary | ICD-10-CM | POA: Diagnosis not present

## 2019-11-28 DIAGNOSIS — Z1159 Encounter for screening for other viral diseases: Secondary | ICD-10-CM

## 2019-11-28 DIAGNOSIS — I10 Essential (primary) hypertension: Secondary | ICD-10-CM

## 2019-11-28 DIAGNOSIS — Z794 Long term (current) use of insulin: Secondary | ICD-10-CM

## 2019-11-28 LAB — BAYER DCA HB A1C WAIVED: HB A1C (BAYER DCA - WAIVED): 8.9 % — ABNORMAL HIGH (ref <7.0)

## 2019-11-29 LAB — CBC WITH DIFFERENTIAL/PLATELET
Basophils Absolute: 0.1 10*3/uL (ref 0.0–0.2)
Basos: 1 %
EOS (ABSOLUTE): 0.1 10*3/uL (ref 0.0–0.4)
Eos: 2 %
Hematocrit: 39.1 % (ref 37.5–51.0)
Hemoglobin: 12.7 g/dL — ABNORMAL LOW (ref 13.0–17.7)
Immature Grans (Abs): 0.1 10*3/uL (ref 0.0–0.1)
Immature Granulocytes: 1 %
Lymphocytes Absolute: 1.5 10*3/uL (ref 0.7–3.1)
Lymphs: 20 %
MCH: 27.5 pg (ref 26.6–33.0)
MCHC: 32.5 g/dL (ref 31.5–35.7)
MCV: 85 fL (ref 79–97)
Monocytes Absolute: 0.7 10*3/uL (ref 0.1–0.9)
Monocytes: 9 %
Neutrophils Absolute: 5 10*3/uL (ref 1.4–7.0)
Neutrophils: 67 %
Platelets: 259 10*3/uL (ref 150–450)
RBC: 4.62 x10E6/uL (ref 4.14–5.80)
RDW: 13.1 % (ref 11.6–15.4)
WBC: 7.4 10*3/uL (ref 3.4–10.8)

## 2019-11-29 LAB — CMP14+EGFR
ALT: 18 IU/L (ref 0–44)
AST: 17 IU/L (ref 0–40)
Albumin/Globulin Ratio: 1.6 (ref 1.2–2.2)
Albumin: 3.9 g/dL (ref 3.7–4.7)
Alkaline Phosphatase: 81 IU/L (ref 48–121)
BUN/Creatinine Ratio: 14 (ref 10–24)
BUN: 11 mg/dL (ref 8–27)
Bilirubin Total: 0.3 mg/dL (ref 0.0–1.2)
CO2: 23 mmol/L (ref 20–29)
Calcium: 9.8 mg/dL (ref 8.6–10.2)
Chloride: 100 mmol/L (ref 96–106)
Creatinine, Ser: 0.77 mg/dL (ref 0.76–1.27)
GFR calc Af Amer: 100 mL/min/{1.73_m2} (ref >59)
GFR calc non Af Amer: 86 mL/min/{1.73_m2} (ref >59)
Globulin, Total: 2.5 g/dL (ref 1.5–4.5)
Glucose: 195 mg/dL — ABNORMAL HIGH (ref 65–99)
Potassium: 4.7 mmol/L (ref 3.5–5.2)
Sodium: 138 mmol/L (ref 134–144)
Total Protein: 6.4 g/dL (ref 6.0–8.5)

## 2019-11-29 LAB — THYROID PANEL WITH TSH
Free Thyroxine Index: 2.4 (ref 1.2–4.9)
T3 Uptake Ratio: 28 % (ref 24–39)
T4, Total: 8.6 ug/dL (ref 4.5–12.0)
TSH: 1.5 u[IU]/mL (ref 0.450–4.500)

## 2019-11-30 LAB — HEPATITIS C ANTIBODY: Hep C Virus Ab: <0.1 s/co ratio (ref 0.0–0.9)

## 2019-11-30 LAB — SPECIMEN STATUS REPORT

## 2019-12-08 ENCOUNTER — Encounter: Payer: Self-pay | Admitting: Family Medicine

## 2019-12-08 MED ORDER — METOPROLOL TARTRATE 50 MG PO TABS: 25.0000 mg | ORAL_TABLET | Freq: Every day | ORAL | 1 refills | Status: DC

## 2019-12-08 MED ORDER — INSULIN DETEMIR 100 UNIT/ML FLEXPEN: 60.0000 [IU] | PEN_INJECTOR | Freq: Every day | SUBCUTANEOUS | 3 refills | Status: DC

## 2019-12-08 NOTE — Progress Notes (Signed)
Subjective:  Patient ID: [name] [name],  male    DOB: 1941/03/01  Age: 79 y.o.    CC: Follow-up (3 month)   HPI [name] [name] presents for  follow-up of hypertension. Patient has no history of headache chest pain or shortness of breath or recent cough. Patient also denies symptoms of TIA such as numbness weakness lateralizing. Patient denies side effects from medication. States taking it regularly.  Patient also  in for follow-up of elevated cholesterol. Doing well without complaints on current medication. Denies side effects  including myalgia and arthralgia and nausea. Also in today for liver function testing. Currently no chest pain, shortness of breath or other cardiovascular related symptoms noted.  Follow-up of diabetes. Patient does check blood sugar at home. Readings run between 90 and 100. Patient denies symptoms such as excessive hunger or urinary frequency, excessive hunger, nausea No significant hypoglycemic spells noted since he DCed the glimiperide. Medications reviewed. Pt reports taking them regularly. Pt. denies complication/adverse reaction today.    History [name] has a past medical history of Basal cell carcinoma, CAD (coronary artery disease), Cardiac arrest ([place]), Cataract, Diabetes mellitus without complication ([place]), Diverticulitis, Erectile dysfunction, Gastroesophageal reflux disease with hiatal hernia, Goiter, nontoxic, multinodular, Hiatal hernia, Hyperlipidemia, Inguinal hernia, Kidney stone (1980), Leg fracture, Liver hemangioma, Nephrolithiasis, and Vitamin D deficiency.   He has a past surgical history that includes LEFT HEART CATH AND CORONARY ANGIOGRAPHY (N/A, 08/25/2018); CORONARY STENT INTERVENTION (N/A, 08/25/2018); Laceration repair (2008); Back surgery (1984); Tibia fracture surgery; Cataract extraction (Bilateral); and Eye surgery.   His family history includes Cancer in his father; Diabetes in an other family member; Early death (age of onset: 38) in  his brother; Heart attack in his sister; Heart disease in his brother; Heart disease (age of onset: 53) in his mother.He reports that he quit smoking about 36 years ago. His smoking use included cigarettes. He has a 27.00 pack-year smoking history. He has never used smokeless tobacco. He reports that he does not drink alcohol and does not use drugs.  Current Outpatient Medications on File Prior to Visit  Medication Sig Dispense Refill  . Accu-Chek FastClix Lancets MISC Check BS TID Dx E11.9 300 each 3  . acetaminophen (TYLENOL) 325 MG tablet Take 2 tablets (650 mg total) by mouth every 4 (four) hours as needed for mild pain or headache.    Marland Kitchen amLODipine (NORVASC) 5 MG tablet Take 1 tablet (5 mg total) by mouth daily. 90 tablet 3  . aspirin EC 81 MG tablet Take 1 tablet (81 mg total) by mouth daily. 90 tablet 3  . atorvastatin (LIPITOR) 80 MG tablet Take 1 tablet (80 mg total) by mouth daily at 6 PM. 90 tablet 3  . Blood Glucose Monitoring Suppl (ACCU-CHEK AVIVA PLUS) w/Device KIT Check BS TID Dx E11.9 1 kit 0  . Cholecalciferol (VITAMIN D3 MAXIMUM STRENGTH) 125 MCG (5000 UT) capsule Take 5,000 Units by mouth. Take one tablet three times weekly    . CINNAMON PO Take by mouth 2 (two) times daily.    . clopidogrel (PLAVIX) 75 MG tablet Take 1 tablet (75 mg total) by mouth daily with breakfast. 90 tablet 3  . fluticasone (FLONASE) 50 MCG/ACT nasal spray Place 2 sprays into both nostrils daily as needed for allergies.    . Garlic 638 MG TABS Take by mouth 2 (two) times daily.     Marland Kitchen glucose blood (ACCU-CHEK AVIVA PLUS) test strip Check BS TID Dx E11.9 300 each 3  .  irbesartan (AVAPRO) 150 MG tablet Take 1 tablet (150 mg total) by mouth daily. 90 tablet 3  . levothyroxine (SYNTHROID) 75 MCG tablet Take 1 tablet (75 mcg total) by mouth daily. 90 tablet 3  . metFORMIN (GLUCOPHAGE) 1000 MG tablet Take 1 tablet (1,000 mg total) by mouth 2 (two) times daily with a meal. 180 tablet 3  . Semaglutide,0.25 or  0.5MG/DOS, (OZEMPIC, 0.25 OR 0.5 MG/DOSE,) 2 MG/1.5ML SOPN Inject 0.5 mg into the skin once a week. 1 pen 1   No current facility-administered medications on file prior to visit.    ROS Review of Systems  Constitutional: Negative.   HENT: Negative.   Eyes: Negative for visual disturbance.  Respiratory: Negative for cough and shortness of breath.   Cardiovascular: Negative for chest pain and leg swelling.  Gastrointestinal: Negative for abdominal pain, diarrhea, nausea and vomiting.  Genitourinary: Negative for difficulty urinating.  Musculoskeletal: Negative for arthralgias and myalgias.  Skin: Negative for rash.  Neurological: Negative for headaches.  Psychiatric/Behavioral: Negative for sleep disturbance.    Objective:  BP 123/73   Pulse 62   Temp (!) 97.4 F (36.3 C) (Temporal)   Ht 5' 10" (1.778 m)   Wt 161 lb 3.2 oz (73.1 kg)   BMI 23.13 kg/m   BP Readings from Last 3 Encounters:  11/28/19 123/73  08/29/19 134/74  08/22/19 140/78    Wt Readings from Last 3 Encounters:  11/28/19 161 lb 3.2 oz (73.1 kg)  08/29/19 165 lb 6 oz (75 kg)  08/22/19 168 lb (76.2 kg)     Physical Exam Vitals reviewed.  Constitutional:      Appearance: He is well-developed.  HENT:     Head: Normocephalic and atraumatic.     Right Ear: Tympanic membrane and external ear normal. No decreased hearing noted.     Left Ear: Tympanic membrane and external ear normal. No decreased hearing noted.     Mouth/Throat:     Pharynx: No oropharyngeal exudate or posterior oropharyngeal erythema.  Eyes:     Pupils: Pupils are equal, round, and reactive to light.  Cardiovascular:     Rate and Rhythm: Normal rate and regular rhythm.     Heart sounds: No murmur heard.   Pulmonary:     Effort: No respiratory distress.     Breath sounds: Normal breath sounds.  Abdominal:     General: Bowel sounds are normal.     Palpations: Abdomen is soft. There is no mass.     Tenderness: There is no abdominal  tenderness.  Musculoskeletal:     Cervical back: Normal range of motion and neck supple.     Diabetic Foot Exam - Simple   No data filed        Assessment & Plan:   [name] [name] was seen today for follow-up.  Diagnoses and all orders for this visit:  Type 2 diabetes mellitus treated with insulin (HCC) -     CBC with Differential/Platelet -     CMP14+EGFR -     Bayer DCA Hb A1c Waived -     insulin detemir (LEVEMIR) 100 UNIT/ML FlexPen; Inject 60 Units into the skin daily.  Essential hypertension -     CBC with Differential/Platelet -     CMP14+EGFR  Mixed hyperlipidemia -     CBC with Differential/Platelet -     CMP14+EGFR  Hypothyroidism due to acquired atrophy of thyroid -     CBC with Differential/Platelet -     CMP14+EGFR -  Thyroid Panel With TSH  Need for hepatitis C screening test -     Cancel: Hepatitis C antibody  Other orders -     Hepatitis C antibody -     Specimen status report -     metoprolol tartrate (LOPRESSOR) 50 MG tablet; Take 0.5 tablets (25 mg total) by mouth daily.   I have changed Gunnar Fusi. Willner's insulin detemir and metoprolol tartrate. I am also having him maintain his fluticasone, acetaminophen, Cholecalciferol, Garlic, Accu-Chek Aviva Plus, Accu-Chek Aviva Plus, Accu-Chek FastClix Lancets, metFORMIN, CINNAMON PO, aspirin EC, amLODipine, atorvastatin, clopidogrel, levothyroxine, irbesartan, and Ozempic (0.25 or 0.5 MG/DOSE).  Meds ordered this encounter  Medications  . insulin detemir (LEVEMIR) 100 UNIT/ML FlexPen    Sig: Inject 60 Units into the skin daily.    Dispense:  45 mL    Refill:  3  . metoprolol tartrate (LOPRESSOR) 50 MG tablet    Sig: Take 0.5 tablets (25 mg total) by mouth daily.    Dispense:  45 tablet    Refill:  1     Follow-up: Return in about 3 months (around 02/28/2020).  Claretta Fraise, M.D.

## 2020-01-14 NOTE — Telephone Encounter (Signed)
  Care Management   Outreach Note  09/13/2018 Name: [name] [name] MRN: [id number] DOB: 06/11/1940  Referred by: Chipper Herb, MD Reason for referral : Hospitalization Follow-up (Post dishcarge med rec and discuss CCM services)   An unsuccessful telephone outreach was attempted today. The patient was referred to the case management team by for assistance with chronic care management and care coordination.  He also requires a post discharge medication reconciliation.   Follow Up Plan: A HIPPA compliant phone message was left for the patient providing contact information and requesting a return call.    Chong Sicilian, RN-BC, BSN Nurse Care Manager [facility] [phone]

## 2020-01-15 ENCOUNTER — Encounter: Payer: Self-pay | Admitting: *Deleted

## 2020-01-24 DIAGNOSIS — L905 Scar conditions and fibrosis of skin: Secondary | ICD-10-CM | POA: Diagnosis not present

## 2020-01-24 DIAGNOSIS — L570 Actinic keratosis: Secondary | ICD-10-CM | POA: Diagnosis not present

## 2020-01-24 DIAGNOSIS — Z08 Encounter for follow-up examination after completed treatment for malignant neoplasm: Secondary | ICD-10-CM | POA: Diagnosis not present

## 2020-01-24 DIAGNOSIS — Z85828 Personal history of other malignant neoplasm of skin: Secondary | ICD-10-CM | POA: Diagnosis not present

## 2020-01-24 DIAGNOSIS — X32XXXD Exposure to sunlight, subsequent encounter: Secondary | ICD-10-CM | POA: Diagnosis not present

## 2020-02-13 ENCOUNTER — Other Ambulatory Visit: Payer: Self-pay

## 2020-02-13 ENCOUNTER — Ambulatory Visit (INDEPENDENT_AMBULATORY_CARE_PROVIDER_SITE_OTHER): Payer: Medicare PPO

## 2020-02-13 DIAGNOSIS — Z23 Encounter for immunization: Secondary | ICD-10-CM

## 2020-03-03 ENCOUNTER — Ambulatory Visit: Payer: Medicare PPO | Admitting: Family Medicine

## 2020-03-03 ENCOUNTER — Encounter: Payer: Self-pay | Admitting: Family Medicine

## 2020-03-03 ENCOUNTER — Other Ambulatory Visit: Payer: Self-pay

## 2020-03-03 VITALS — BP 134/81 | HR 71 | Temp 97.6°F | Ht 70.0 in | Wt 160.0 lb

## 2020-03-03 DIAGNOSIS — E782 Mixed hyperlipidemia: Secondary | ICD-10-CM

## 2020-03-03 DIAGNOSIS — E034 Atrophy of thyroid (acquired): Secondary | ICD-10-CM | POA: Diagnosis not present

## 2020-03-03 DIAGNOSIS — I10 Essential (primary) hypertension: Secondary | ICD-10-CM

## 2020-03-03 DIAGNOSIS — E119 Type 2 diabetes mellitus without complications: Secondary | ICD-10-CM

## 2020-03-03 DIAGNOSIS — Z794 Long term (current) use of insulin: Secondary | ICD-10-CM | POA: Diagnosis not present

## 2020-03-03 LAB — BAYER DCA HB A1C WAIVED: HB A1C (BAYER DCA - WAIVED): 7.8 % — ABNORMAL HIGH (ref <7.0)

## 2020-03-03 MED ORDER — OZEMPIC (0.25 OR 0.5 MG/DOSE) 2 MG/1.5ML ~~LOC~~ SOPN
0.5000 mg | PEN_INJECTOR | SUBCUTANEOUS | 3 refills | Status: DC
Start: 2020-03-03 — End: 2020-06-04

## 2020-03-03 MED ORDER — METOPROLOL TARTRATE 50 MG PO TABS: 25.0000 mg | ORAL_TABLET | Freq: Every day | ORAL | 1 refills | Status: DC

## 2020-03-03 MED ORDER — METFORMIN HCL 1000 MG PO TABS: 1000.0000 mg | ORAL_TABLET | Freq: Two times a day (BID) | ORAL | 3 refills | Status: DC

## 2020-03-03 NOTE — Progress Notes (Signed)
Subjective:  Patient ID: [name] [name],  male    DOB: [birth year]  Age: 79 y.o.    CC: Follow-up (3 month)   HPI [name] [name] presents for  follow-up of hypertension. Patient has no history of headache chest pain or shortness of breath or recent cough. Patient also denies symptoms of TIA such as numbness weakness lateralizing. Patient denies side effects from medication. States taking it regularly.  Patient also  in for follow-up of elevated cholesterol. Doing well without complaints on current medication. Denies side effects  including myalgia and arthralgia and nausea. Also in today for liver function testing. Currently no chest pain, shortness of breath or other cardiovascular related symptoms noted.  Follow-up of diabetes. Patient does check blood sugar at home. Readings run between 90 and 140 Patient denies symptoms such as excessive hunger or urinary frequency, excessive hunger, nausea No significant hypoglycemic spells noted. Medications reviewed. Pt reports taking them regularly. Pt. denies complication/adverse reaction today.   Pt. had decreased his daily insulin to 55 units until about a month ago he went up to 60.  He thinks that the reading today of 8.1 may be better next time based on this increase.  Patient sees Dr. Percival Spanish for his cardiology care.  He tells me today that his insurance will no longer cover his clopidogrel.  I suggested a switch to Brilinta if approved by Dr. Percival Spanish. History [name] [name] has a past medical history of Basal cell carcinoma, CAD (coronary artery disease), Cardiac arrest ([place]), Cataract, Diabetes mellitus without complication ([place]), Diverticulitis, Erectile dysfunction, Gastroesophageal reflux disease with hiatal hernia, Goiter, nontoxic, multinodular, Hiatal hernia, Hyperlipidemia, Inguinal hernia, Kidney stone (1980), Leg fracture, Liver hemangioma, Nephrolithiasis, and Vitamin D deficiency.   He has a past surgical history that includes LEFT  HEART CATH AND CORONARY ANGIOGRAPHY (N/A, 08/25/2018); CORONARY STENT INTERVENTION (N/A, 08/25/2018); Laceration repair (2008); Back surgery (1984); Tibia fracture surgery; Cataract extraction (Bilateral); and Eye surgery.   His family history includes Cancer in his father; Diabetes in an other family member; Early death (age of onset: 40) in his brother; Heart attack in his sister; Heart disease in his brother; Heart disease (age of onset: 66) in his mother.He reports that he quit smoking about 36 years ago. His smoking use included cigarettes. He has a 27.00 pack-year smoking history. He has never used smokeless tobacco. He reports that he does not drink alcohol and does not use drugs.  Current Outpatient Medications on File Prior to Visit  Medication Sig Dispense Refill  . Accu-Chek FastClix Lancets MISC Check BS TID Dx E11.9 300 each 3  . acetaminophen (TYLENOL) 325 MG tablet Take 2 tablets (650 mg total) by mouth every 4 (four) hours as needed for mild pain or headache.    Marland Kitchen amLODipine (NORVASC) 5 MG tablet Take 1 tablet (5 mg total) by mouth daily. 90 tablet 3  . aspirin EC 81 MG tablet Take 1 tablet (81 mg total) by mouth daily. 90 tablet 3  . atorvastatin (LIPITOR) 80 MG tablet Take 1 tablet (80 mg total) by mouth daily at 6 PM. 90 tablet 3  . Blood Glucose Monitoring Suppl (ACCU-CHEK AVIVA PLUS) w/Device KIT Check BS TID Dx E11.9 1 kit 0  . Cholecalciferol (VITAMIN D3 MAXIMUM STRENGTH) 125 MCG (5000 UT) capsule Take 5,000 Units by mouth. Take one tablet three times weekly     . CINNAMON PO Take by mouth 2 (two) times daily.     . clopidogrel (PLAVIX) 75 MG tablet  Take 1 tablet (75 mg total) by mouth daily with breakfast. 90 tablet 3  . fluticasone (FLONASE) 50 MCG/ACT nasal spray Place 2 sprays into both nostrils daily as needed for allergies.     . Garlic 132 MG TABS Take by mouth 2 (two) times daily.     Marland Kitchen glucose blood (ACCU-CHEK AVIVA PLUS) test strip Check BS TID Dx E11.9 300 each 3  .  insulin detemir (LEVEMIR) 100 UNIT/ML FlexPen Inject 60 Units into the skin daily. 45 mL 3  . irbesartan (AVAPRO) 150 MG tablet Take 1 tablet (150 mg total) by mouth daily. 90 tablet 3  . levothyroxine (SYNTHROID) 75 MCG tablet Take 1 tablet (75 mcg total) by mouth daily. 90 tablet 3   No current facility-administered medications on file prior to visit.    ROS Review of Systems  Constitutional: Negative for fever.  Respiratory: Negative for shortness of breath.   Cardiovascular: Negative for chest pain.  Musculoskeletal: Negative for arthralgias.  Skin: Negative for rash.    Objective:  BP 134/81   Pulse 71   Temp 97.6 F (36.4 C) (Temporal)   Ht '5\' 10"'  (1.778 m)   Wt 160 lb (72.6 kg)   BMI 22.96 kg/m   BP Readings from Last 3 Encounters:  03/03/20 134/81  11/28/19 123/73  08/29/19 134/74    Wt Readings from Last 3 Encounters:  03/03/20 160 lb (72.6 kg)  11/28/19 161 lb 3.2 oz (73.1 kg)  08/29/19 165 lb 6 oz (75 kg)     Physical Exam Vitals reviewed.  Constitutional:      Appearance: He is well-developed.  HENT:     Head: Normocephalic and atraumatic.     Right Ear: Tympanic membrane and external ear normal. No decreased hearing noted.     Left Ear: Tympanic membrane and external ear normal. No decreased hearing noted.     Mouth/Throat:     Pharynx: No oropharyngeal exudate or posterior oropharyngeal erythema.  Eyes:     Pupils: Pupils are equal, round, and reactive to light.  Cardiovascular:     Rate and Rhythm: Normal rate and regular rhythm.     Heart sounds: No murmur heard.   Pulmonary:     Effort: No respiratory distress.     Breath sounds: Normal breath sounds.  Abdominal:     General: Bowel sounds are normal.     Palpations: Abdomen is soft. There is no mass.     Tenderness: There is no abdominal tenderness.  Musculoskeletal:     Cervical back: Normal range of motion and neck supple.     Diabetic Foot Exam - Simple   Simple Foot Form Visual  Inspection No deformities, no ulcerations, no other skin breakdown bilaterally: Yes Sensation Testing Intact to touch and monofilament testing bilaterally: Yes Pulse Check Posterior Tibialis and Dorsalis pulse intact bilaterally: Yes Comments       Assessment & Plan:   [name] [name] was seen today for follow-up.  Diagnoses and all orders for this visit:  Type 2 diabetes mellitus treated with insulin ([place]) -     CBC with Differential/Platelet -     CMP14+EGFR -     Bayer DCA Hb A1c Waived -     metFORMIN (GLUCOPHAGE) 1000 MG tablet; Take 1 tablet (1,000 mg total) by mouth 2 (two) times daily with a meal.  Essential hypertension -     CBC with Differential/Platelet -     CMP14+EGFR -     metoprolol tartrate (LOPRESSOR) 50  MG tablet; Take 0.5 tablets (25 mg total) by mouth daily.  Mixed hyperlipidemia -     CBC with Differential/Platelet -     CMP14+EGFR -     Lipid panel  Hypothyroidism due to acquired atrophy of thyroid -     CBC with Differential/Platelet -     CMP14+EGFR -     Thyroid Panel With TSH  Other orders -     Semaglutide,0.25 or 0.5MG/DOS, (OZEMPIC, 0.25 OR 0.5 MG/DOSE,) 2 MG/1.5ML SOPN; Inject 0.5 mg into the skin once a week.   I am having Danielle Lento. Nicole Kindred maintain his fluticasone, acetaminophen, Cholecalciferol, Garlic, Accu-Chek Aviva Plus, Accu-Chek Aviva Plus, Accu-Chek FastClix Lancets, CINNAMON PO, aspirin EC, amLODipine, atorvastatin, clopidogrel, levothyroxine, irbesartan, insulin detemir, Ozempic (0.25 or 0.5 MG/DOSE), metoprolol tartrate, and metFORMIN.  Meds ordered this encounter  Medications  . Semaglutide,0.25 or 0.5MG/DOS, (OZEMPIC, 0.25 OR 0.5 MG/DOSE,) 2 MG/1.5ML SOPN    Sig: Inject 0.5 mg into the skin once a week.    Dispense:  1.5 mL    Refill:  3  . metoprolol tartrate (LOPRESSOR) 50 MG tablet    Sig: Take 0.5 tablets (25 mg total) by mouth daily.    Dispense:  45 tablet    Refill:  1  . metFORMIN (GLUCOPHAGE) 1000 MG tablet    Sig:  Take 1 tablet (1,000 mg total) by mouth 2 (two) times daily with a meal.    Dispense:  180 tablet    Refill:  3     Follow-up: Return in about 3 months (around 06/03/2020).  Claretta Fraise, M.D.

## 2020-03-04 LAB — CBC WITH DIFFERENTIAL/PLATELET
Basophils Absolute: 0.1 10*3/uL (ref 0.0–0.2)
Basos: 1 %
EOS (ABSOLUTE): 0.2 10*3/uL (ref 0.0–0.4)
Eos: 3 %
Hematocrit: 43.4 % (ref 37.5–51.0)
Hemoglobin: 14.4 g/dL (ref 13.0–17.7)
Immature Grans (Abs): 0 10*3/uL (ref 0.0–0.1)
Immature Granulocytes: 1 %
Lymphocytes Absolute: 1.7 10*3/uL (ref 0.7–3.1)
Lymphs: 21 %
MCH: 28 pg (ref 26.6–33.0)
MCHC: 33.2 g/dL (ref 31.5–35.7)
MCV: 84 fL (ref 79–97)
Monocytes Absolute: 0.7 10*3/uL (ref 0.1–0.9)
Monocytes: 8 %
Neutrophils Absolute: 5.6 10*3/uL (ref 1.4–7.0)
Neutrophils: 66 %
Platelets: 254 10*3/uL (ref 150–450)
RBC: 5.14 x10E6/uL (ref 4.14–5.80)
RDW: 13.2 % (ref 11.6–15.4)
WBC: 8.3 10*3/uL (ref 3.4–10.8)

## 2020-03-04 LAB — THYROID PANEL WITH TSH
Free Thyroxine Index: 2.6 (ref 1.2–4.9)
T3 Uptake Ratio: 29 % (ref 24–39)
T4, Total: 9.1 ug/dL (ref 4.5–12.0)
TSH: 1.72 u[IU]/mL (ref 0.450–4.500)

## 2020-03-04 LAB — CMP14+EGFR
ALT: 27 IU/L (ref 0–44)
AST: 22 IU/L (ref 0–40)
Albumin/Globulin Ratio: 2.1 (ref 1.2–2.2)
Albumin: 4.5 g/dL (ref 3.7–4.7)
Alkaline Phosphatase: 83 IU/L (ref 44–121)
BUN/Creatinine Ratio: 15 (ref 10–24)
BUN: 13 mg/dL (ref 8–27)
Bilirubin Total: 0.3 mg/dL (ref 0.0–1.2)
CO2: 20 mmol/L (ref 20–29)
Calcium: 9.8 mg/dL (ref 8.6–10.2)
Chloride: 103 mmol/L (ref 96–106)
Creatinine, Ser: 0.87 mg/dL (ref 0.76–1.27)
GFR calc Af Amer: 95 mL/min/{1.73_m2} (ref >59)
GFR calc non Af Amer: 82 mL/min/{1.73_m2} (ref >59)
Globulin, Total: 2.1 g/dL (ref 1.5–4.5)
Glucose: 119 mg/dL — ABNORMAL HIGH (ref 65–99)
Potassium: 4.6 mmol/L (ref 3.5–5.2)
Sodium: 140 mmol/L (ref 134–144)
Total Protein: 6.6 g/dL (ref 6.0–8.5)

## 2020-03-04 LAB — LIPID PANEL
Chol/HDL Ratio: 2.1 ratio (ref 0.0–5.0)
Cholesterol, Total: 72 mg/dL — ABNORMAL LOW (ref 100–199)
HDL: 34 mg/dL — ABNORMAL LOW (ref >39)
LDL Chol Calc (NIH): 23 mg/dL (ref 0–99)
Triglycerides: 62 mg/dL (ref 0–149)
VLDL Cholesterol Cal: 15 mg/dL (ref 5–40)

## 2020-03-04 NOTE — Progress Notes (Signed)
Hello [name] [name],  Your lab result is normal and/or stable.Some minor variations that are not significant are commonly marked abnormal, but do not represent any medical problem for you.  Best regards, Benetta Maclaren, M.D.

## 2020-03-06 DIAGNOSIS — L905 Scar conditions and fibrosis of skin: Secondary | ICD-10-CM | POA: Diagnosis not present

## 2020-03-06 DIAGNOSIS — Z08 Encounter for follow-up examination after completed treatment for malignant neoplasm: Secondary | ICD-10-CM | POA: Diagnosis not present

## 2020-03-06 DIAGNOSIS — Z85828 Personal history of other malignant neoplasm of skin: Secondary | ICD-10-CM | POA: Diagnosis not present

## 2020-03-26 ENCOUNTER — Other Ambulatory Visit: Payer: Self-pay

## 2020-03-26 ENCOUNTER — Ambulatory Visit (INDEPENDENT_AMBULATORY_CARE_PROVIDER_SITE_OTHER): Payer: Medicare PPO

## 2020-03-26 DIAGNOSIS — Z23 Encounter for immunization: Secondary | ICD-10-CM | POA: Diagnosis not present

## 2020-03-26 NOTE — Progress Notes (Signed)
   Covid-19 Vaccination Clinic  Name:  AARON BOSTWICK    MRN: [id number] DOB: 02/20/1941  03/26/2020  Mr. Chriswell was observed post Covid-19 immunization for 15 minutes without incident. He was provided with Vaccine Information Sheet and instruction to access the V-Safe system.   Mr. Theiler was instructed to call 911 with any severe reactions post vaccine: Marland Kitchen Difficulty breathing  . Swelling of face and throat  . A fast heartbeat  . A bad rash all over body  . Dizziness and weakness   Immunizations Administered    No immunizations on file.

## 2020-04-23 DIAGNOSIS — D485 Neoplasm of uncertain behavior of skin: Secondary | ICD-10-CM | POA: Diagnosis not present

## 2020-04-23 DIAGNOSIS — J3489 Other specified disorders of nose and nasal sinuses: Secondary | ICD-10-CM | POA: Diagnosis not present

## 2020-04-23 DIAGNOSIS — C4441 Basal cell carcinoma of skin of scalp and neck: Secondary | ICD-10-CM | POA: Diagnosis not present

## 2020-04-30 ENCOUNTER — Other Ambulatory Visit: Payer: Self-pay | Admitting: Family Medicine

## 2020-04-30 DIAGNOSIS — I10 Essential (primary) hypertension: Secondary | ICD-10-CM

## 2020-05-02 DIAGNOSIS — D485 Neoplasm of uncertain behavior of skin: Secondary | ICD-10-CM | POA: Diagnosis not present

## 2020-05-02 DIAGNOSIS — D0439 Carcinoma in situ of skin of other parts of face: Secondary | ICD-10-CM | POA: Diagnosis not present

## 2020-05-07 ENCOUNTER — Other Ambulatory Visit: Payer: Self-pay | Admitting: Family Medicine

## 2020-05-07 DIAGNOSIS — E119 Type 2 diabetes mellitus without complications: Secondary | ICD-10-CM

## 2020-05-07 DIAGNOSIS — Z794 Long term (current) use of insulin: Secondary | ICD-10-CM

## 2020-05-21 ENCOUNTER — Ambulatory Visit: Payer: Medicare PPO | Admitting: Plastic Surgery

## 2020-05-21 ENCOUNTER — Encounter: Payer: Self-pay | Admitting: Plastic Surgery

## 2020-05-21 ENCOUNTER — Other Ambulatory Visit: Payer: Self-pay

## 2020-05-21 VITALS — BP 155/84 | HR 89 | Ht 70.0 in | Wt 167.6 lb

## 2020-05-21 DIAGNOSIS — D0439 Carcinoma in situ of skin of other parts of face: Secondary | ICD-10-CM | POA: Diagnosis not present

## 2020-05-21 NOTE — Progress Notes (Signed)
Referring Provider Claretta Fraise, MD [place],   [id number]   CC:  Chief Complaint  Patient presents with  . consult      [name] [name] is an 80 y.o. male.  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  He was sent by his Mohs surgeon Dr. Winifred Olive to discuss reconstruction postoperatively.  Patient is here to discuss his reconstructive options.  Allergies  Allergen Reactions  . Actos [Pioglitazone Hydrochloride] Swelling    Fluid retention  . Lisinopril Rash  . Cymbalta [Duloxetine Hcl] Other (See Comments)    Weakness. Legs likerubber, can't walk  . Invokana [Canagliflozin] Rash  . Niaspan [Niacin Er] Other (See Comments)    Increased blood sugar  . Ramipril Diarrhea    Outpatient Encounter Medications as of 05/21/2020  Medication Sig  . Accu-Chek FastClix Lancets MISC Check BS TID Dx E11.9  . acetaminophen (TYLENOL) 325 MG tablet Take 2 tablets (650 mg total) by mouth every 4 (four) hours as needed for mild pain or headache.  Marland Kitchen amLODipine (NORVASC) 5 MG tablet Take 1 tablet (5 mg total) by mouth daily.  Marland Kitchen aspirin EC 81 MG tablet Take 1 tablet (81 mg total) by mouth daily.  Marland Kitchen atorvastatin (LIPITOR) 80 MG tablet Take 1 tablet (80 mg total) by mouth daily at 6 PM.  . Blood Glucose Monitoring Suppl (ACCU-CHEK AVIVA PLUS) w/Device KIT Check BS TID Dx E11.9  . Cholecalciferol 125 MCG (5000 UT) capsule Take 5,000 Units by mouth. Take one tablet three times weekly   . CINNAMON PO Take by mouth 2 (two) times daily.   . fluticasone (FLONASE) 50 MCG/ACT nasal spray Place 2 sprays into both nostrils daily as needed for allergies.   . Garlic 330 MG TABS Take by mouth 2 (two) times daily.   Marland Kitchen glucose blood (ACCU-CHEK AVIVA PLUS) test strip Check BS TID Dx E11.9  . insulin detemir (LEVEMIR) 100 UNIT/ML FlexPen Inject 60 Units into the skin daily.  . irbesartan (AVAPRO) 150 MG tablet Take 1 tablet (150 mg total) by mouth daily.  Marland Kitchen  levothyroxine (SYNTHROID) 75 MCG tablet Take 1 tablet (75 mcg total) by mouth daily.  . metFORMIN (GLUCOPHAGE) 1000 MG tablet TAKE 1 TABLET TWICE DAILY WITH MEALS  . metoprolol tartrate (LOPRESSOR) 50 MG tablet TAKE 1/2 TABLET EVERY DAY  . Semaglutide,0.25 or 0.5MG/DOS, (OZEMPIC, 0.25 OR 0.5 MG/DOSE,) 2 MG/1.5ML SOPN Inject 0.5 mg into the skin once a week.  . clopidogrel (PLAVIX) 75 MG tablet Take 1 tablet (75 mg total) by mouth daily with breakfast. (Patient not taking: Reported on 05/21/2020)   No facility-administered encounter medications on file as of 05/21/2020.     Past Medical History:  Diagnosis Date  . Basal cell carcinoma    left forehead, nose, neck  . CAD (coronary artery disease)    a. s/p DES to Prox-LAD and DES to LCx in 08/2018  . Cardiac arrest ([place])   . Cataract   . Diabetes mellitus without complication ([place])   . Diverticulitis   . Erectile dysfunction   . Gastroesophageal reflux disease with hiatal hernia   . Goiter, nontoxic, multinodular    with macrocyst  . Hiatal hernia    With reflex  . Hyperlipidemia   . Inguinal hernia    right side  . Kidney stone 1980  . Leg fracture    Left  . Liver hemangioma   . Nephrolithiasis   .  Vitamin D deficiency     Past Surgical History:  Procedure Laterality Date  . BACK SURGERY  1984  . CATARACT EXTRACTION Bilateral   . CORONARY STENT INTERVENTION N/A 08/25/2018   Procedure: CORONARY STENT INTERVENTION;  Surgeon: Sherren Mocha, MD;  Location: [facility];  Service: Cardiovascular;  Laterality: N/A;  . EYE SURGERY    . LACERATION REPAIR  2008   Dr. Lenon Curt -to hand  . LEFT HEART CATH AND CORONARY ANGIOGRAPHY N/A 08/25/2018   Procedure: LEFT HEART CATH AND CORONARY ANGIOGRAPHY;  Surgeon: Sherren Mocha, MD;  Location: [facility];  Service: Cardiovascular;  Laterality: N/A;  . TIBIA FRACTURE SURGERY     left leg    Family History  Problem Relation Age of Onset  . Heart attack Sister        Sudden  death in her 14s.  No clear as to the cause  . Heart disease Brother        Valvular  . Heart disease Mother 79       stent, lived to be 15  . Cancer Father        ?lung  . Early death Brother 3       spinal menigitis  . Diabetes Other     Social History   Social History Narrative   ** Merged History Encounter **       Married.  Lives with wife.  Grandson stays with them some.    Denies tobacco use  Review of Systems General: Denies fevers, chills, weight loss CV: Denies chest pain, shortness of breath, palpitations  Physical Exam Vitals with BMI 05/21/2020 03/03/2020 11/28/2019  Height 5' 10" 5' 10" 5' 10"  Weight 167 lbs 10 oz 160 lbs 161 lbs 3 oz  BMI 24.05 67.67 20.94  Systolic 709 628 366  Diastolic 84 81 73  Pulse 89 71 62    General:  No acute distress,  Alert and oriented, Non-Toxic, Normal speech and affect Examination shows a 2.5 cm prominent mass in the left nasal sidewall.  It is still a centimeter or 2 away from his eyelid margin.  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  Patient says it is grown quite a bit over the last few weeks.  I do not see any obvious scars on his left cheek or neck.  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  He is going to undergo Mohs excision.  I explained his reconstruction would likely consist of adjacent tissue transfer and potentially a full-thickness skin graft depending on the size and depth of the defect.  We will plan to do this in the operating room but if the defect is small enough for me like to do a skin graft is possible that we could do this in the office.  We discussed the risks of the procedure that include bleeding, infection, damage surrounding structures and need for additional procedures.  We discussed wound healing complications that might require dressing changes for several weeks postoperatively.  All of his questions were answered and we will  plan to move ahead.  [name] 05/21/2020, 4:35 PM

## 2020-05-30 ENCOUNTER — Telehealth: Payer: Self-pay | Admitting: *Deleted

## 2020-05-30 NOTE — Telephone Encounter (Signed)
   [facility] Pre-operative Risk Assessment    Request for surgical clearance:  1. What type of surgery is being performed? LEFT NASAL RECONSTRUCTION WITH ADJACENT TISSUE TRANSFER POSSIBLE FULL-THICKNESS SKIN GRAFT    2. When is this surgery scheduled? 06/11/2020   3. What type of clearance is required (medical clearance vs. Pharmacy clearance to hold med vs. Both)? BOTH  4. Are there any medications that need to be held prior to surgery and how long?ASA AND PLAVIX    5. Practice name and name of physician performing surgery?  DR Silverio Lay [facility]   6. What is the office phone number? [phone]   7.   What is the office fax number? [phone]   8.   Anesthesia type (None, local, MAC, general) ? GENERAL

## 2020-06-02 NOTE — Telephone Encounter (Signed)
Spoke with patients wife (patient was working outside) and she will have patient call back later today to schedule an appointment for pre-op clearance.

## 2020-06-02 NOTE — Telephone Encounter (Signed)
Faxed to requesting surgeons office via [facility] fax to make them aware.

## 2020-06-02 NOTE — Telephone Encounter (Signed)
Patient is scheduled to see Dr. Percival Spanish 06/06/20 at 4:20 pm

## 2020-06-02 NOTE — Telephone Encounter (Signed)
Patient seeing Dr. Percival Spanish 2/18.  Phone note will be sent to Dr. Percival Spanish so that he can f/u on surgical clearance. This note will be removed from the preop pool. Richardson Dopp, PA-C    06/02/2020 1:01 PM

## 2020-06-02 NOTE — Telephone Encounter (Signed)
   Primary Cardiologist: Minus Breeding, MD Date of surgery:  06/11/2020  Chart reviewed as part of pre-operative protocol coverage.   Hx:  OOH VF arrest in 08/2018, CAD s/p DES to pLAD and DES to LCx in 08/2018, paroxysmal AFib, normal EF by echocardiogram in 08/2018, diabetes mellitus Rx w Insulin, hypertension, hyperlipidemia.  He was last seen by Dr. Percival Spanish in 08/2019.    Because of [name] [name]'s past medical history and time since last visit, he will require a follow-up visit in order to better assess preoperative cardiovascular risk.  Pre-op covering staff: - Please schedule appointment ASAP (surgery scheduled 2/23) and call patient to inform them. If patient already had an upcoming appointment within acceptable timeframe, please add "pre-op clearance" to the appointment notes so provider is aware. - Please contact requesting surgeon's office via preferred method (i.e, phone, fax) to inform them of need for appointment prior to surgery.  Dr. Percival Spanish: Please comment on holding ASA and Clopidogrel for upcoming surgery so that the provider seeing the patient can include in final recommendations.   Route back to:  P CV DIV PREOP  [name] Dopp, PA-C  06/02/2020, 10:10 AM

## 2020-06-04 ENCOUNTER — Ambulatory Visit: Payer: Medicare PPO | Admitting: Family Medicine

## 2020-06-04 ENCOUNTER — Other Ambulatory Visit: Payer: Self-pay

## 2020-06-04 ENCOUNTER — Encounter: Payer: Self-pay | Admitting: Surgical

## 2020-06-04 ENCOUNTER — Encounter: Payer: Self-pay | Admitting: Family Medicine

## 2020-06-04 VITALS — BP 138/80 | HR 74 | Temp 97.0°F | Resp 20 | Ht 70.0 in | Wt 163.4 lb

## 2020-06-04 DIAGNOSIS — E782 Mixed hyperlipidemia: Secondary | ICD-10-CM

## 2020-06-04 DIAGNOSIS — E034 Atrophy of thyroid (acquired): Secondary | ICD-10-CM

## 2020-06-04 DIAGNOSIS — Z794 Long term (current) use of insulin: Secondary | ICD-10-CM

## 2020-06-04 DIAGNOSIS — I10 Essential (primary) hypertension: Secondary | ICD-10-CM

## 2020-06-04 DIAGNOSIS — E119 Type 2 diabetes mellitus without complications: Secondary | ICD-10-CM

## 2020-06-04 LAB — BAYER DCA HB A1C WAIVED: HB A1C (BAYER DCA - WAIVED): 9.3 % — ABNORMAL HIGH (ref <7.0)

## 2020-06-04 MED ORDER — METFORMIN HCL 1000 MG PO TABS: 1000.0000 mg | ORAL_TABLET | Freq: Two times a day (BID) | ORAL | 3 refills | Status: DC

## 2020-06-04 MED ORDER — OZEMPIC (1 MG/DOSE) 4 MG/3ML ~~LOC~~ SOPN: 1.0000 mg | PEN_INJECTOR | SUBCUTANEOUS | 1 refills | Status: DC

## 2020-06-04 MED ORDER — METOPROLOL TARTRATE 50 MG PO TABS: 25.0000 mg | ORAL_TABLET | Freq: Every day | ORAL | 3 refills | Status: DC

## 2020-06-04 NOTE — Progress Notes (Signed)
Surgical Clearance has been received from Dr. Claretta Fraise for patient's upcoming surgery with Dr. Claudia Desanctis.  Medications to hold prior to surgery:  Aspirin (Stop taking: 06/15/20 Begin retaking 06/22/20)  Plavix (Stop taking: 06/15/20 Begin retaking 06/22/20)  Will notify nursing staff to call patient and document call.

## 2020-06-04 NOTE — Progress Notes (Signed)
Subjective:  Patient ID: [name] [name], male    DOB: 18-Aug-1940  Age: 80 y.o. MRN: [id number]  CC: Medical Management of Chronic Issues   HPI MARQUINN MESCHKE presents forFollow-up of diabetes. Patient checks blood sugar at home.   low 100s fasting and 170 postprandial Patient denies symptoms such as polyuria, polydipsia, excessive hunger, nausea No significant hypoglycemic spells noted. Medications reviewed. Pt reports taking them regularly without complication/adverse reaction being reported today.    Large lesion at left side of nose exophytic 2X7 cm, approx. Needs surgical clearance for removal on 3/3 and site repair on 3/4 by plastics, Dr. Mingo Amber. Surgical clearance to hold ASA and plavix given  History Jaret has a past medical history of Basal cell carcinoma, CAD (coronary artery disease), Cardiac arrest ([place]), Cataract, Diabetes mellitus without complication ([place]), Diverticulitis, Erectile dysfunction, Gastroesophageal reflux disease with hiatal hernia, Goiter, nontoxic, multinodular, Hiatal hernia, Hyperlipidemia, Inguinal hernia, Kidney stone (1980), Leg fracture, Liver hemangioma, Nephrolithiasis, and Vitamin D deficiency.   He has a past surgical history that includes LEFT HEART CATH AND CORONARY ANGIOGRAPHY (N/A, 08/25/2018); CORONARY STENT INTERVENTION (N/A, 08/25/2018); Laceration repair (2008); Back surgery (1984); Tibia fracture surgery; Cataract extraction (Bilateral); and Eye surgery.   His family history includes Cancer in his father; Diabetes in an other family member; Early death (age of onset: 16) in his brother; Heart attack in his sister; Heart disease in his brother; Heart disease (age of onset: 37) in his mother.He reports that he quit smoking about 37 years ago. His smoking use included cigarettes. He has a 27.00 pack-year smoking history. He has never used smokeless tobacco. He reports that he does not drink alcohol and does not use drugs.  Current Outpatient  Medications on File Prior to Visit  Medication Sig Dispense Refill  . Accu-Chek FastClix Lancets MISC Check BS TID Dx E11.9 300 each 3  . acetaminophen (TYLENOL) 325 MG tablet Take 2 tablets (650 mg total) by mouth every 4 (four) hours as needed for mild pain or headache.    Marland Kitchen amLODipine (NORVASC) 5 MG tablet Take 1 tablet (5 mg total) by mouth daily. 90 tablet 3  . aspirin EC 81 MG tablet Take 1 tablet (81 mg total) by mouth daily. 90 tablet 3  . atorvastatin (LIPITOR) 80 MG tablet Take 1 tablet (80 mg total) by mouth daily at 6 PM. 90 tablet 3  . Blood Glucose Monitoring Suppl (ACCU-CHEK AVIVA PLUS) w/Device KIT Check BS TID Dx E11.9 1 kit 0  . Cholecalciferol 125 MCG (5000 UT) capsule Take 5,000 Units by mouth. Take one tablet three times weekly     . CINNAMON PO Take by mouth 2 (two) times daily.     . clopidogrel (PLAVIX) 75 MG tablet Take 1 tablet (75 mg total) by mouth daily with breakfast. 90 tablet 3  . Garlic 664 MG TABS Take by mouth 2 (two) times daily.     Marland Kitchen glucose blood (ACCU-CHEK AVIVA PLUS) test strip Check BS TID Dx E11.9 300 each 3  . insulin detemir (LEVEMIR) 100 UNIT/ML FlexPen Inject 60 Units into the skin daily. 45 mL 3  . irbesartan (AVAPRO) 150 MG tablet Take 1 tablet (150 mg total) by mouth daily. 90 tablet 3  . levothyroxine (SYNTHROID) 75 MCG tablet Take 1 tablet (75 mcg total) by mouth daily. 90 tablet 3  . fluticasone (FLONASE) 50 MCG/ACT nasal spray Place 2 sprays into both nostrils daily as needed for allergies.  (Patient not taking: Reported  on 06/04/2020)     No current facility-administered medications on file prior to visit.    ROS Review of Systems  Constitutional: Negative for fever.  Respiratory: Negative for shortness of breath.   Cardiovascular: Negative for chest pain.  Musculoskeletal: Negative for arthralgias.  Skin: Negative for rash.    Objective:  BP 138/80   Pulse 74   Temp (!) 97 F (36.1 C) (Temporal)   Resp 20   Ht '5\' 10"'  (1.778 m)    Wt 163 lb 6 oz (74.1 kg)   SpO2 98%   BMI 23.44 kg/m   BP Readings from Last 3 Encounters:  06/04/20 138/80  05/21/20 (!) 155/84  03/03/20 134/81    Wt Readings from Last 3 Encounters:  06/04/20 163 lb 6 oz (74.1 kg)  05/21/20 167 lb 9.6 oz (76 kg)  03/03/20 160 lb (72.6 kg)     Physical Exam Vitals reviewed.  Constitutional:      Appearance: He is well-developed and well-nourished.  HENT:     Head: Normocephalic and atraumatic.     Right Ear: Tympanic membrane and external ear normal. No decreased hearing noted.     Left Ear: Tympanic membrane and external ear normal. No decreased hearing noted.     Mouth/Throat:     Pharynx: No oropharyngeal exudate or posterior oropharyngeal erythema.  Eyes:     Pupils: Pupils are equal, round, and reactive to light.  Cardiovascular:     Rate and Rhythm: Normal rate and regular rhythm.     Heart sounds: No murmur heard.   Pulmonary:     Effort: No respiratory distress.     Breath sounds: Normal breath sounds.  Abdominal:     General: Bowel sounds are normal.     Palpations: Abdomen is soft. There is no mass.     Tenderness: There is no abdominal tenderness.  Musculoskeletal:     Cervical back: Normal range of motion and neck supple.       Assessment & Plan:   Nicodemus was seen today for medical management of chronic issues.  Diagnoses and all orders for this visit:  Type 2 diabetes mellitus treated with insulin ([place]) -     Bayer DCA Hb A1c Waived -     Microalbumin / creatinine urine ratio -     CBC with Differential/Platelet -     CMP14+EGFR -     Lipid panel -     metFORMIN (GLUCOPHAGE) 1000 MG tablet; Take 1 tablet (1,000 mg total) by mouth 2 (two) times daily with a meal.  Essential hypertension -     CBC with Differential/Platelet -     CMP14+EGFR -     Lipid panel -     metoprolol tartrate (LOPRESSOR) 50 MG tablet; Take 0.5 tablets (25 mg total) by mouth daily.  Mixed hyperlipidemia -     CBC with  Differential/Platelet -     CMP14+EGFR -     Lipid panel  Hypothyroidism due to acquired atrophy of thyroid -     CBC with Differential/Platelet -     CMP14+EGFR -     Lipid panel -     Thyroid Panel With TSH  Other orders -     Discontinue: Semaglutide, 1 MG/DOSE, (OZEMPIC, 1 MG/DOSE,) 4 MG/3ML SOPN; Inject 1 mg into the skin once a week. -     Semaglutide, 1 MG/DOSE, (OZEMPIC, 1 MG/DOSE,) 4 MG/3ML SOPN; Inject 1 mg into the skin once a week.   Encouraged  pt. To begin exercising, following strict low carb diet and increase semaglutide to start bringing A1c down.    I have discontinued Jaxn Chiquito. Shimizu's Ozempic (0.25 or 0.5 MG/DOSE). I have also changed his metoprolol tartrate and metFORMIN. Additionally, I am having him maintain his fluticasone, acetaminophen, Cholecalciferol, Garlic, Accu-Chek Aviva Plus, Accu-Chek Aviva Plus, Accu-Chek FastClix Lancets, CINNAMON PO, aspirin EC, amLODipine, atorvastatin, clopidogrel, levothyroxine, irbesartan, insulin detemir, and Ozempic (1 MG/DOSE).  Meds ordered this encounter  Medications  . metoprolol tartrate (LOPRESSOR) 50 MG tablet    Sig: Take 0.5 tablets (25 mg total) by mouth daily.    Dispense:  45 tablet    Refill:  3  . metFORMIN (GLUCOPHAGE) 1000 MG tablet    Sig: Take 1 tablet (1,000 mg total) by mouth 2 (two) times daily with a meal.    Dispense:  180 tablet    Refill:  3  . DISCONTD: Semaglutide, 1 MG/DOSE, (OZEMPIC, 1 MG/DOSE,) 4 MG/3ML SOPN    Sig: Inject 1 mg into the skin once a week.    Dispense:  9 mL    Refill:  1  . Semaglutide, 1 MG/DOSE, (OZEMPIC, 1 MG/DOSE,) 4 MG/3ML SOPN    Sig: Inject 1 mg into the skin once a week.    Dispense:  9 mL    Refill:  1     Follow-up: Return in about 3 months (around 09/01/2020).  Claretta Fraise, M.D.

## 2020-06-05 LAB — CMP14+EGFR
ALT: 33 IU/L (ref 0–44)
AST: 26 IU/L (ref 0–40)
Albumin/Globulin Ratio: 1.9 (ref 1.2–2.2)
Albumin: 4.4 g/dL (ref 3.7–4.7)
Alkaline Phosphatase: 95 IU/L (ref 44–121)
BUN/Creatinine Ratio: 12 (ref 10–24)
BUN: 11 mg/dL (ref 8–27)
Bilirubin Total: 0.3 mg/dL (ref 0.0–1.2)
CO2: 20 mmol/L (ref 20–29)
Calcium: 9.7 mg/dL (ref 8.6–10.2)
Chloride: 102 mmol/L (ref 96–106)
Creatinine, Ser: 0.9 mg/dL (ref 0.76–1.27)
GFR calc Af Amer: 94 mL/min/{1.73_m2} (ref >59)
GFR calc non Af Amer: 81 mL/min/{1.73_m2} (ref >59)
Globulin, Total: 2.3 g/dL (ref 1.5–4.5)
Glucose: 158 mg/dL — ABNORMAL HIGH (ref 65–99)
Potassium: 4.7 mmol/L (ref 3.5–5.2)
Sodium: 141 mmol/L (ref 134–144)
Total Protein: 6.7 g/dL (ref 6.0–8.5)

## 2020-06-05 LAB — LIPID PANEL
Chol/HDL Ratio: 1.9 ratio (ref 0.0–5.0)
Cholesterol, Total: 66 mg/dL — ABNORMAL LOW (ref 100–199)
HDL: 34 mg/dL — ABNORMAL LOW (ref >39)
LDL Chol Calc (NIH): 20 mg/dL (ref 0–99)
Triglycerides: 43 mg/dL (ref 0–149)
VLDL Cholesterol Cal: 12 mg/dL (ref 5–40)

## 2020-06-05 LAB — CBC WITH DIFFERENTIAL/PLATELET
Basophils Absolute: 0 10*3/uL (ref 0.0–0.2)
Basos: 1 %
EOS (ABSOLUTE): 0.2 10*3/uL (ref 0.0–0.4)
Eos: 2 %
Hematocrit: 42.4 % (ref 37.5–51.0)
Hemoglobin: 13.7 g/dL (ref 13.0–17.7)
Immature Grans (Abs): 0 10*3/uL (ref 0.0–0.1)
Immature Granulocytes: 0 %
Lymphocytes Absolute: 1.2 10*3/uL (ref 0.7–3.1)
Lymphs: 18 %
MCH: 26.4 pg — ABNORMAL LOW (ref 26.6–33.0)
MCHC: 32.3 g/dL (ref 31.5–35.7)
MCV: 82 fL (ref 79–97)
Monocytes Absolute: 0.6 10*3/uL (ref 0.1–0.9)
Monocytes: 9 %
Neutrophils Absolute: 4.8 10*3/uL (ref 1.4–7.0)
Neutrophils: 70 %
Platelets: 247 10*3/uL (ref 150–450)
RBC: 5.18 x10E6/uL (ref 4.14–5.80)
RDW: 12.9 % (ref 11.6–15.4)
WBC: 6.9 10*3/uL (ref 3.4–10.8)

## 2020-06-05 LAB — THYROID PANEL WITH TSH
Free Thyroxine Index: 2.7 (ref 1.2–4.9)
T3 Uptake Ratio: 29 % (ref 24–39)
T4, Total: 9.4 ug/dL (ref 4.5–12.0)
TSH: 1.95 u[IU]/mL (ref 0.450–4.500)

## 2020-06-05 LAB — MICROALBUMIN / CREATININE URINE RATIO
Creatinine, Urine: 56.4 mg/dL
Microalb/Creat Ratio: 22 mg/g creat (ref 0–29)
Microalbumin, Urine: 12.4 ug/mL

## 2020-06-05 NOTE — Progress Notes (Signed)
Cardiology Office Note   Date:  06/06/2020   ID:  [name] [name], DOB 1941/03/14, MRN [id number]  PCP:  Claretta Fraise, MD  Cardiologist:   Minus Breeding, MD   Chief Complaint  Patient presents with  . Coronary Artery Disease      History of Present Illness: [name] [name] is a 80 y.o. male who presents for follow up of multiple cardiovascular risk factors.  I saw him in Feb 2016 for evaluation of this same thing.  He has diabetes that is not well controlled.  He did have a POET (Plain Old Exercise Treadmill) in 2012 that was negative for ischemia but there was some brief atrial fib post test.  He had cardiac arrest in April 2020. cardiac catheterization showed severe proximal LAD stenosis and he underwent successful stenting and was extubated 08/23/2018. His hospital course complicated by atrial fibrillation with RVR maintained on aspirin Plavix as well as Eliquis and amiodarone.  He went to rehab after discharge  He presents for follow-up.  He is to have lesion removed from his nose.  It sounds like it is a basal cell cancer and there is going to need to be skin reconstruction.  It was suggested that he stop both his Plavix and his aspirin prior to the surgery.  The patient denies any acute cardiovascular symptoms.  He does not think he has much outside because it has been winter.  He does do some walking at [employer]. The patient denies any new symptoms such as chest discomfort, neck or arm discomfort. There has been no new shortness of breath, PND or orthopnea. There have been no reported palpitations, presyncope or syncope.    Past Medical History:  Diagnosis Date  . Basal cell carcinoma    left forehead, nose, neck  . CAD (coronary artery disease)    a. s/p DES to Prox-LAD and DES to LCx in 08/2018  . Cardiac arrest ([place])   . Cataract   . Diabetes mellitus without complication ([place])   . Diverticulitis   . Erectile dysfunction   . Gastroesophageal reflux disease with hiatal  hernia   . Goiter, nontoxic, multinodular    with macrocyst  . Hiatal hernia    With reflex  . Hyperlipidemia   . Inguinal hernia    right side  . Kidney stone 1980  . Leg fracture    Left  . Liver hemangioma   . Nephrolithiasis   . Vitamin D deficiency     Past Surgical History:  Procedure Laterality Date  . BACK SURGERY  1984  . CATARACT EXTRACTION Bilateral   . CORONARY STENT INTERVENTION N/A 08/25/2018   Procedure: CORONARY STENT INTERVENTION;  Surgeon: Sherren Mocha, MD;  Location: [facility];  Service: Cardiovascular;  Laterality: N/A;  . EYE SURGERY    . LACERATION REPAIR  2008   Dr. Lenon Curt -to hand  . LEFT HEART CATH AND CORONARY ANGIOGRAPHY N/A 08/25/2018   Procedure: LEFT HEART CATH AND CORONARY ANGIOGRAPHY;  Surgeon: Sherren Mocha, MD;  Location: [facility];  Service: Cardiovascular;  Laterality: N/A;  . TIBIA FRACTURE SURGERY     left leg     Current Outpatient Medications  Medication Sig Dispense Refill  . Accu-Chek FastClix Lancets MISC Check BS TID Dx E11.9 300 each 3  . acetaminophen (TYLENOL) 325 MG tablet Take 2 tablets (650 mg total) by mouth every 4 (four) hours as needed for mild pain or headache.    Marland Kitchen amLODipine (  NORVASC) 5 MG tablet Take 1 tablet (5 mg total) by mouth daily. 90 tablet 3  . aspirin EC 81 MG tablet Take 1 tablet (81 mg total) by mouth daily. 90 tablet 3  . atorvastatin (LIPITOR) 80 MG tablet Take 1 tablet (80 mg total) by mouth daily at 6 PM. 90 tablet 3  . Blood Glucose Monitoring Suppl (ACCU-CHEK AVIVA PLUS) w/Device KIT Check BS TID Dx E11.9 1 kit 0  . Cholecalciferol 125 MCG (5000 UT) capsule Take 5,000 Units by mouth. Take one tablet three times weekly     . CINNAMON PO Take by mouth 2 (two) times daily.     . clopidogrel (PLAVIX) 75 MG tablet Take 1 tablet (75 mg total) by mouth daily with breakfast. 90 tablet 3  . fluticasone (FLONASE) 50 MCG/ACT nasal spray Place 2 sprays into both nostrils daily as needed for  allergies.    . Garlic 048 MG TABS Take by mouth 2 (two) times daily.     Marland Kitchen glucose blood (ACCU-CHEK AVIVA PLUS) test strip Check BS TID Dx E11.9 300 each 3  . insulin detemir (LEVEMIR) 100 UNIT/ML FlexPen Inject 60 Units into the skin daily. 45 mL 3  . irbesartan (AVAPRO) 150 MG tablet Take 1 tablet (150 mg total) by mouth daily. 90 tablet 3  . levothyroxine (SYNTHROID) 75 MCG tablet Take 1 tablet (75 mcg total) by mouth daily. 90 tablet 3  . metFORMIN (GLUCOPHAGE) 1000 MG tablet Take 1 tablet (1,000 mg total) by mouth 2 (two) times daily with a meal. 180 tablet 3  . metoprolol tartrate (LOPRESSOR) 50 MG tablet Take 0.5 tablets (25 mg total) by mouth daily. 45 tablet 3  . Semaglutide, 1 MG/DOSE, (OZEMPIC, 1 MG/DOSE,) 4 MG/3ML SOPN Inject 1 mg into the skin once a week. 9 mL 1   No current facility-administered medications for this visit.    Allergies:   Actos [pioglitazone hydrochloride], Lisinopril, Cymbalta [duloxetine hcl], Invokana [canagliflozin], Niaspan [niacin er], and Ramipril    ROS:  Please see the history of present illness.   Otherwise, review of systems are positive for none.   All other systems are reviewed and negative.    PHYSICAL EXAM: VS:  BP 138/82   Pulse 72   Ht 5' 9" (1.753 m)   Wt 169 lb 3.2 oz (76.7 kg)   SpO2 98%   BMI 24.99 kg/m  , BMI Body mass index is 24.99 kg/m. GENERAL:  Well appearing NECK:  No jugular venous distention, waveform within normal limits, carotid upstroke brisk and symmetric, no bruits, no thyromegaly LUNGS:  Clear to auscultation bilaterally CHEST:  Unremarkable HEART:  PMI not displaced or sustained,S1 and S2 within normal limits, no S3, no S4, no clicks, no rubs, no murmurs ABD:  Flat, positive bowel sounds normal in frequency in pitch, no bruits, no rebound, no guarding, no midline pulsatile mass, no hepatomegaly, no splenomegaly EXT:  2 plus pulses throughout, no edema, no cyanosis no clubbing     EKG:  EKG is  ordered  today. Normal sinus rhythm, rate 72, axis within normal limits, intervals within normal limits, poor anterior R wave progression, possible old anteroseptal infarct.  Recent Labs: 06/04/2020: ALT 33; BUN 11; Creatinine, Ser 0.90; Hemoglobin 13.7; Platelets 247; Potassium 4.7; Sodium 141; TSH 1.950    Lipid Panel    Component Value Date/Time   CHOL 66 (L) 06/04/2020 0849   CHOL 128 10/16/2012 1545   TRIG 43 06/04/2020 0849   TRIG 52 03/18/2016  1126   TRIG 102 10/16/2012 1545   HDL 34 (L) 06/04/2020 0849   HDL 53 03/18/2016 1126   HDL 45 10/16/2012 1545   CHOLHDL 1.9 06/04/2020 0849   LDLCALC 20 06/04/2020 0849   LDLCALC 66 12/13/2013 0842   LDLCALC 63 10/16/2012 1545      Wt Readings from Last 3 Encounters:  06/06/20 169 lb 3.2 oz (76.7 kg)  06/04/20 163 lb 6 oz (74.1 kg)  05/21/20 167 lb 9.6 oz (76 kg)      Other studies Reviewed: Additional studies/ records that were reviewed today include:  Labs Review of the above records demonstrates:  Please see elsewhere in the note.     ASSESSMENT AND PLAN:  CAD The patient has no new sypmtoms.  No further cardiovascular testing is indicated.  We will continue with aggressive risk reduction and meds as listed.  He can stop his Plavix for surgery and I do not think he needs to restart this afterwards.  PREOP: The patient is at acceptable risk for the planned surgery.  He has a high functional level.  He has no active symptoms.  There are no acute findings.  However, I would not want to discontinue his aspirin and his Plavix unless the bleeding risk was absolutely prohibitive using aspirin alone.  I think he should remain on the aspirin and can stop the Plavix prior to the surgery and off of it afterwards.  I have a phone call into Dr. Claudia Desanctis to discuss this.  We called his office and I am awaiting a call back  HTN BP is controlled.  No change in therapy.   HLD LDL was 23 with an HDL of 34.  No change in therapy.   Type 2 DM A1c  was 7.8.  This is much improved from previous.  He is on GLP-1 receptor antagonist and other therapy.  I will defer management to Claretta Fraise, MD   Current medicines are reviewed at length with the patient today.  The patient does not have concerns regarding medicines.  The following changes have been made:    Labs/ tests ordered today include:   Orders Placed This Encounter  Procedures  . EKG 12-Lead     Disposition:  Follow up with me in 12 months.   Signed, Minus Breeding, MD  06/06/2020 3:50 PM    [facility]

## 2020-06-06 ENCOUNTER — Encounter: Payer: Self-pay | Admitting: Cardiology

## 2020-06-06 ENCOUNTER — Telehealth: Payer: Self-pay | Admitting: *Deleted

## 2020-06-06 ENCOUNTER — Ambulatory Visit (INDEPENDENT_AMBULATORY_CARE_PROVIDER_SITE_OTHER): Payer: Medicare PPO | Admitting: Cardiology

## 2020-06-06 ENCOUNTER — Other Ambulatory Visit: Payer: Self-pay

## 2020-06-06 VITALS — BP 138/82 | HR 72 | Ht 69.0 in | Wt 169.2 lb

## 2020-06-06 DIAGNOSIS — E118 Type 2 diabetes mellitus with unspecified complications: Secondary | ICD-10-CM

## 2020-06-06 DIAGNOSIS — E785 Hyperlipidemia, unspecified: Secondary | ICD-10-CM

## 2020-06-06 DIAGNOSIS — I10 Essential (primary) hypertension: Secondary | ICD-10-CM

## 2020-06-06 DIAGNOSIS — I2510 Atherosclerotic heart disease of native coronary artery without angina pectoris: Secondary | ICD-10-CM

## 2020-06-06 DIAGNOSIS — Z794 Long term (current) use of insulin: Secondary | ICD-10-CM

## 2020-06-06 NOTE — Telephone Encounter (Signed)
Called on (06/04/20) and spoke with the patient and informed him of the message.  Patient verbalized understanding and agreed.//AB/CMA

## 2020-06-06 NOTE — Telephone Encounter (Signed)
-----   Message from Charlies Constable, PA-C sent at 06/04/2020 12:08 PM EST ----- Regarding: Call patient to stop meds prior to 2/27### Can someone please assist me with calling this patient and inform him to stop the following meds prior to his surgery with [facility]?  Medications to hold prior to surgery:  Aspirin (Stop taking: 06/15/20 Begin retaking 06/22/20)  Plavix (Stop taking: 06/15/20 Begin retaking 06/22/20)  Thank you! Appreciate it

## 2020-06-06 NOTE — Patient Instructions (Addendum)
Medication Instructions:  WHEN YOU STOP PLAVIX FOR SURGERY YOU DO NOT NEED TO RESTART *If you need a refill on your cardiac medications before your next appointment, please call your pharmacy*  Follow-Up: At [facility], you and your health needs are our priority.  As part of our continuing mission to provide you with exceptional heart care, we have created designated Provider Care Teams.  These Care Teams include your primary Cardiologist (physician) and Advanced Practice Providers (APPs -  Physician Assistants and Nurse Practitioners) who all work together to provide you with the care you need, when you need it.  Your next appointment:   1 year(s)  The format for your next appointment:   In Person  Provider:   Minus Breeding, MD   WE WILL CONTACT YOU AFTER Dr. [place]

## 2020-06-08 NOTE — Progress Notes (Signed)
Hello [name] [name],  Your lab result is normal and/or stable.Some minor variations that are not significant are commonly marked abnormal, but do not represent any medical problem for you.  Best regards, Everlena Mackley, M.D.

## 2020-06-10 ENCOUNTER — Ambulatory Visit: Payer: Medicare PPO | Admitting: Family Medicine

## 2020-06-11 ENCOUNTER — Encounter: Payer: Self-pay | Admitting: Surgical

## 2020-06-11 ENCOUNTER — Ambulatory Visit (INDEPENDENT_AMBULATORY_CARE_PROVIDER_SITE_OTHER): Payer: Medicare PPO | Admitting: Surgical

## 2020-06-11 ENCOUNTER — Other Ambulatory Visit: Payer: Self-pay

## 2020-06-11 VITALS — BP 148/83 | HR 66 | Ht 70.0 in | Wt 164.0 lb

## 2020-06-11 DIAGNOSIS — D0439 Carcinoma in situ of skin of other parts of face: Secondary | ICD-10-CM

## 2020-06-11 MED ORDER — ONDANSETRON HCL 4 MG PO TABS: 4.0000 mg | ORAL_TABLET | Freq: Three times a day (TID) | ORAL | 0 refills | Status: DC | PRN

## 2020-06-11 MED ORDER — TRAMADOL HCL 50 MG PO TABS: 50.0000 mg | ORAL_TABLET | Freq: Three times a day (TID) | ORAL | 0 refills | Status: AC | PRN

## 2020-06-11 NOTE — H&P (View-Only) (Signed)
Patient ID: [name] [name], male    DOB: 1941-02-23, 80 y.o.   MRN: [id number]  Chief Complaint  Patient presents with  . Pre-op Exam      ICD-10-CM   1. 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  He presents for preoperative evaluation for upcoming procedure, left nasal reconstruction with adjacent tissue transfer and full thickness skin graft, scheduled for 06/20/20 with Dr. Claudia Desanctis.  The patient has not had problems with anesthesia. No history of DVT/PE.  No family history of DVT/PE.  No family or personal history of bleeding or clotting disorders.  Patient is currently on Plavix and aspirin.  He has been notified by his cardiologist to stop Plavix 5 days prior to surgery and did not restart it.  Cardiology does recommend staying on his aspirin, Dr. Claudia Desanctis is aware.  Summary of Previous Visit: Patient is scheduled to have the squamous cell carcinoma removed from the left side of his nose with Dr. Winifred Olive on 06/19/2020.  PMH Significant for: Coronary artery disease status post stent, on aspirin and Plavix.  Diabetes mellitus with most recent A1c 7.8.  Hyperlipidemia.  GERD.  Vitamin D deficiency.  Atrial fibrillation, hypertension.  Heart failure.  Patient reports he has been doing well, reports no recent chest pain, shortness of breath or any other changes in his health status.  Past Medical History: Allergies: Allergies  Allergen Reactions  . Actos [Pioglitazone Hydrochloride] Swelling    Fluid retention  . Lisinopril Rash  . Cymbalta [Duloxetine Hcl] Other (See Comments)    Weakness. Legs likerubber, can't walk  . Invokana [Canagliflozin] Rash  . Niaspan [Niacin Er] Other (See Comments)    Increased blood sugar  . Ramipril Diarrhea    Current Medications:  Current Outpatient Medications:  .  Accu-Chek FastClix Lancets MISC, Check BS  TID Dx E11.9, Disp: 300 each, Rfl: 3 .  acetaminophen (TYLENOL) 325 MG tablet, Take 2 tablets (650 mg total) by mouth every 4 (four) hours as needed for mild pain or headache., Disp: , Rfl:  .  amLODipine (NORVASC) 5 MG tablet, Take 1 tablet (5 mg total) by mouth daily., Disp: 90 tablet, Rfl: 3 .  aspirin EC 81 MG tablet, Take 1 tablet (81 mg total) by mouth daily., Disp: 90 tablet, Rfl: 3 .  atorvastatin (LIPITOR) 80 MG tablet, Take 1 tablet (80 mg total) by mouth daily at 6 PM., Disp: 90 tablet, Rfl: 3 .  Blood Glucose Monitoring Suppl (ACCU-CHEK AVIVA PLUS) w/Device KIT, Check BS TID Dx E11.9, Disp: 1 kit, Rfl: 0 .  Cholecalciferol 125 MCG (5000 UT) capsule, Take 5,000 Units by mouth. Take one tablet three times weekly , Disp: , Rfl:  .  CINNAMON PO, Take by mouth 2 (two) times daily. , Disp: , Rfl:  .  clopidogrel (PLAVIX) 75 MG tablet, Take 1 tablet (75 mg total) by mouth daily with breakfast., Disp: 90 tablet, Rfl: 3 .  fluticasone (FLONASE) 50 MCG/ACT nasal spray, Place 2 sprays into both nostrils daily as needed for allergies., Disp: , Rfl:  .  Garlic 938 MG TABS, Take by mouth 2 (two) times daily. , Disp: , Rfl:  .  glucose blood (ACCU-CHEK AVIVA PLUS) test strip, Check BS TID Dx E11.9, Disp: 300 each, Rfl: 3 .  insulin detemir (LEVEMIR) 100 UNIT/ML FlexPen, Inject 60 Units into the skin daily., Disp: 45 mL, Rfl: 3 .  irbesartan (AVAPRO) 150 MG tablet, Take 1 tablet (150 mg total) by mouth daily., Disp: 90 tablet, Rfl: 3 .  levothyroxine (SYNTHROID) 75 MCG tablet, Take 1 tablet (75 mcg total) by mouth daily., Disp: 90 tablet, Rfl: 3 .  metFORMIN (GLUCOPHAGE) 1000 MG tablet, Take 1 tablet (1,000 mg total) by mouth 2 (two) times daily with a meal., Disp: 180 tablet, Rfl: 3 .  metoprolol tartrate (LOPRESSOR) 50 MG tablet, Take 0.5 tablets (25 mg total) by mouth daily., Disp: 45 tablet, Rfl: 3 .  ondansetron (ZOFRAN) 4 MG tablet, Take 1 tablet (4 mg total) by mouth every 8 (eight) hours as  needed for nausea or vomiting., Disp: 20 tablet, Rfl: 0 .  Semaglutide, 1 MG/DOSE, (OZEMPIC, 1 MG/DOSE,) 4 MG/3ML SOPN, Inject 1 mg into the skin once a week., Disp: 9 mL, Rfl: 1 .  traMADol (ULTRAM) 50 MG tablet, Take 1 tablet (50 mg total) by mouth every 8 (eight) hours as needed for up to 5 days., Disp: 15 tablet, Rfl: 0  Past Medical Problems: Past Medical History:  Diagnosis Date  . Basal cell carcinoma    left forehead, nose, neck  . CAD (coronary artery disease)    a. s/p DES to Prox-LAD and DES to LCx in 08/2018  . Cardiac arrest ([place])   . Cataract   . Diabetes mellitus without complication ([place])   . Diverticulitis   . Erectile dysfunction   . Gastroesophageal reflux disease with hiatal hernia   . Goiter, nontoxic, multinodular    with macrocyst  . Hiatal hernia    With reflex  . Hyperlipidemia   . Inguinal hernia    right side  . Kidney stone 1980  . Leg fracture    Left  . Liver hemangioma   . Nephrolithiasis   . Vitamin D deficiency     Past Surgical History: Past Surgical History:  Procedure Laterality Date  . BACK SURGERY  1984  . CATARACT EXTRACTION Bilateral   . CORONARY STENT INTERVENTION N/A 08/25/2018   Procedure: CORONARY STENT INTERVENTION;  Surgeon: Sherren Mocha, MD;  Location: [facility];  Service: Cardiovascular;  Laterality: N/A;  . EYE SURGERY    . LACERATION REPAIR  2008   Dr. Lenon Curt -to hand  . LEFT HEART CATH AND CORONARY ANGIOGRAPHY N/A 08/25/2018   Procedure: LEFT HEART CATH AND CORONARY ANGIOGRAPHY;  Surgeon: Sherren Mocha, MD;  Location: [facility];  Service: Cardiovascular;  Laterality: N/A;  . TIBIA FRACTURE SURGERY     left leg    Social History: Social History   Socioeconomic History  . Marital status: Married    Spouse name: Not on file  . Number of children: 1  . Years of education: Not on file  . Highest education level: 11th grade  Occupational History  . Occupation: Retired  Tobacco Use  . Smoking  status: Former Smoker    Packs/day: 1.00    Years: 27.00    Pack years: 27.00    Types: Cigarettes    Quit date: 04/20/1983    Years since quitting: 37.1  . Smokeless tobacco: Never Used  . Tobacco comment: Quit 25 years ago  Vaping Use  . Vaping Use: Never used  Substance and Sexual Activity  . Alcohol use: No  . Drug use: No  . Sexual activity: Yes  Other Topics Concern  . Not  on file  Social History Narrative   ** Merged History Encounter **       Married.  Lives with wife.  Grandson stays with them some.     Social Determinants of Health   Financial Resource Strain: Not on file  Food Insecurity: Not on file  Transportation Needs: Not on file  Physical Activity: Not on file  Stress: Not on file  Social Connections: Not on file  Intimate Partner Violence: Not on file    Family History: Family History  Problem Relation Age of Onset  . Heart attack Sister        Sudden death in her 17s.  No clear as to the cause  . Heart disease Brother        Valvular  . Heart disease Mother 98       stent, lived to be 39  . Cancer Father        ?lung  . Early death Brother 3       spinal menigitis  . Diabetes Other     Review of Systems: Review of Systems  Constitutional: Negative.   Respiratory: Negative.   Cardiovascular: Negative.   Gastrointestinal: Negative.   Neurological: Negative.     Physical Exam: Vital Signs BP (!) 148/83 (BP Location: Left Arm, Patient Position: Sitting, Cuff Size: Normal)   Pulse 66   Ht _0  (1.778 m)   Wt 164 lb (74.4 kg)   SpO2 97%   BMI 23.53 kg/m   Physical Exam Constitutional:      General: Not in acute distress.    Appearance: Normal appearance. Not ill-appearing.  HENT:     Head: Normocephalic and atraumatic.  Pedunculated mass on left side of nose with ulcerative center. Eyes:     Pupils: Pupils are equal, round Neck:     Musculoskeletal: Normal range of motion.  Cardiovascular:     Rate and Rhythm: Normal rate and  regular rhythm.     Pulses: Normal pulses.     Heart sounds: Normal heart sounds. No murmur.  Pulmonary:     Effort: Pulmonary effort is normal. No respiratory distress.     Breath sounds: Normal breath sounds. No wheezing.  Abdominal:     General: Abdomen is flat. There is no distension.     Palpations: Abdomen is soft.     Tenderness: There is no abdominal tenderness.  Musculoskeletal: Normal range of motion.  Skin:    General: Skin is warm and dry.     Findings: No erythema or rash.  Neurological:     General: No focal deficit present.     Mental Status: Alert and oriented to person, place, and time. Mental status is at baseline.     Motor: No weakness.  Psychiatric:        Mood and Affect: Mood normal.        Behavior: Behavior normal.    Assessment/Plan: The patient is scheduled for left nasal reconstruction with adjacent tissue transfer and possible full-thickness skin graft to nose with Dr. Claudia Desanctis.  Risks, benefits, and alternatives of procedure discussed, questions answered and consent obtained.    Smoking Status: Non-smoker, quit 30 years ago; Counseling Given?  N/A  Caprini Score: 5, high; Risk Factors include: Age and length of planned surgery. Recommendation for mechanical and pharmacological prophylaxis per Caprini guidelines.. Encourage early ambulation.   Pictures obtained: Today at consult  Post-op Rx sent to pharmacy: Tramadol, Zofran.  Patient reports he would like to use Tylenol  for postop pain, but with use tramadol as needed for severe pain.  Patient is aware to stop Plavix On 06/15/2020.  He is going to remain on aspirin per cardiology recommendations.  Patient reports that he was instructed not to restart Plavix after surgery by cardiology.  I recommend he stop any over-the-counter supplements that he takes including garlic, cinnamon, multivitamins.  He can restart these postoperatively.  Patient is in agreement with this. Clearance for surgery received from PCP  and cardiology.  Patient was provided with the General Surgical Risk consent document and Pain Medication Agreement prior to their appointment.  They had adequate time to read through the risk consent documents and Pain Medication Agreement. We also discussed them in person together during this preop appointment. All of their questions were answered to their satisfaction.  Recommended calling if they have any further questions.  Risk consent form and Pain Medication Agreement to be scanned into patient's chart.  The risks that can be encountered with and after a skin graft were discussed and include the following but not limited to these: bleeding, infection, delayed healing, anesthesia risks, skin sensation changes, injury to structures including nerves, blood vessels, and muscles which may be temporary or permanent, allergies to tape, suture materials and glues, blood products, topical preparations or injected agents, skin contour irregularities, skin discoloration and swelling, deep vein thrombosis, cardiac and pulmonary complications, pain, which may persist, failure of the graft and possible need for revisional surgery or staged procedures.  We discussed the possibility of needing to take cartilage graft from his posterior ear if the Mohs excision invades any cartilage of his nose.  Patient is understanding of this and in agreement with this.   Electronically signed by: Carola Rhine Scheeler, PA-C 06/11/2020 9:44 AM

## 2020-06-11 NOTE — Progress Notes (Signed)
Patient ID: [name] [name], male    DOB: 1941-02-23, 80 y.o.   MRN: [id number]  Chief Complaint  Patient presents with  . Pre-op Exam      ICD-10-CM   1. 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  He presents for preoperative evaluation for upcoming procedure, left nasal reconstruction with adjacent tissue transfer and full thickness skin graft, scheduled for 06/20/20 with Dr. Claudia Desanctis.  The patient has not had problems with anesthesia. No history of DVT/PE.  No family history of DVT/PE.  No family or personal history of bleeding or clotting disorders.  Patient is currently on Plavix and aspirin.  He has been notified by his cardiologist to stop Plavix 5 days prior to surgery and did not restart it.  Cardiology does recommend staying on his aspirin, Dr. Claudia Desanctis is aware.  Summary of Previous Visit: Patient is scheduled to have the squamous cell carcinoma removed from the left side of his nose with Dr. Winifred Olive on 06/19/2020.  PMH Significant for: Coronary artery disease status post stent, on aspirin and Plavix.  Diabetes mellitus with most recent A1c 7.8.  Hyperlipidemia.  GERD.  Vitamin D deficiency.  Atrial fibrillation, hypertension.  Heart failure.  Patient reports he has been doing well, reports no recent chest pain, shortness of breath or any other changes in his health status.  Past Medical History: Allergies: Allergies  Allergen Reactions  . Actos [Pioglitazone Hydrochloride] Swelling    Fluid retention  . Lisinopril Rash  . Cymbalta [Duloxetine Hcl] Other (See Comments)    Weakness. Legs likerubber, can't walk  . Invokana [Canagliflozin] Rash  . Niaspan [Niacin Er] Other (See Comments)    Increased blood sugar  . Ramipril Diarrhea    Current Medications:  Current Outpatient Medications:  .  Accu-Chek FastClix Lancets MISC, Check BS  TID Dx E11.9, Disp: 300 each, Rfl: 3 .  acetaminophen (TYLENOL) 325 MG tablet, Take 2 tablets (650 mg total) by mouth every 4 (four) hours as needed for mild pain or headache., Disp: , Rfl:  .  amLODipine (NORVASC) 5 MG tablet, Take 1 tablet (5 mg total) by mouth daily., Disp: 90 tablet, Rfl: 3 .  aspirin EC 81 MG tablet, Take 1 tablet (81 mg total) by mouth daily., Disp: 90 tablet, Rfl: 3 .  atorvastatin (LIPITOR) 80 MG tablet, Take 1 tablet (80 mg total) by mouth daily at 6 PM., Disp: 90 tablet, Rfl: 3 .  Blood Glucose Monitoring Suppl (ACCU-CHEK AVIVA PLUS) w/Device KIT, Check BS TID Dx E11.9, Disp: 1 kit, Rfl: 0 .  Cholecalciferol 125 MCG (5000 UT) capsule, Take 5,000 Units by mouth. Take one tablet three times weekly , Disp: , Rfl:  .  CINNAMON PO, Take by mouth 2 (two) times daily. , Disp: , Rfl:  .  clopidogrel (PLAVIX) 75 MG tablet, Take 1 tablet (75 mg total) by mouth daily with breakfast., Disp: 90 tablet, Rfl: 3 .  fluticasone (FLONASE) 50 MCG/ACT nasal spray, Place 2 sprays into both nostrils daily as needed for allergies., Disp: , Rfl:  .  Garlic 938 MG TABS, Take by mouth 2 (two) times daily. , Disp: , Rfl:  .  glucose blood (ACCU-CHEK AVIVA PLUS) test strip, Check BS TID Dx E11.9, Disp: 300 each, Rfl: 3 .  insulin detemir (LEVEMIR) 100 UNIT/ML FlexPen, Inject 60 Units into the skin daily., Disp: 45 mL, Rfl: 3 .  irbesartan (AVAPRO) 150 MG tablet, Take 1 tablet (150 mg total) by mouth daily., Disp: 90 tablet, Rfl: 3 .  levothyroxine (SYNTHROID) 75 MCG tablet, Take 1 tablet (75 mcg total) by mouth daily., Disp: 90 tablet, Rfl: 3 .  metFORMIN (GLUCOPHAGE) 1000 MG tablet, Take 1 tablet (1,000 mg total) by mouth 2 (two) times daily with a meal., Disp: 180 tablet, Rfl: 3 .  metoprolol tartrate (LOPRESSOR) 50 MG tablet, Take 0.5 tablets (25 mg total) by mouth daily., Disp: 45 tablet, Rfl: 3 .  ondansetron (ZOFRAN) 4 MG tablet, Take 1 tablet (4 mg total) by mouth every 8 (eight) hours as  needed for nausea or vomiting., Disp: 20 tablet, Rfl: 0 .  Semaglutide, 1 MG/DOSE, (OZEMPIC, 1 MG/DOSE,) 4 MG/3ML SOPN, Inject 1 mg into the skin once a week., Disp: 9 mL, Rfl: 1 .  traMADol (ULTRAM) 50 MG tablet, Take 1 tablet (50 mg total) by mouth every 8 (eight) hours as needed for up to 5 days., Disp: 15 tablet, Rfl: 0  Past Medical Problems: Past Medical History:  Diagnosis Date  . Basal cell carcinoma    left forehead, nose, neck  . CAD (coronary artery disease)    a. s/p DES to Prox-LAD and DES to LCx in 08/2018  . Cardiac arrest ([place])   . Cataract   . Diabetes mellitus without complication ([place])   . Diverticulitis   . Erectile dysfunction   . Gastroesophageal reflux disease with hiatal hernia   . Goiter, nontoxic, multinodular    with macrocyst  . Hiatal hernia    With reflex  . Hyperlipidemia   . Inguinal hernia    right side  . Kidney stone 1980  . Leg fracture    Left  . Liver hemangioma   . Nephrolithiasis   . Vitamin D deficiency     Past Surgical History: Past Surgical History:  Procedure Laterality Date  . BACK SURGERY  1984  . CATARACT EXTRACTION Bilateral   . CORONARY STENT INTERVENTION N/A 08/25/2018   Procedure: CORONARY STENT INTERVENTION;  Surgeon: Sherren Mocha, MD;  Location: [facility];  Service: Cardiovascular;  Laterality: N/A;  . EYE SURGERY    . LACERATION REPAIR  2008   Dr. Lenon Curt -to hand  . LEFT HEART CATH AND CORONARY ANGIOGRAPHY N/A 08/25/2018   Procedure: LEFT HEART CATH AND CORONARY ANGIOGRAPHY;  Surgeon: Sherren Mocha, MD;  Location: [facility];  Service: Cardiovascular;  Laterality: N/A;  . TIBIA FRACTURE SURGERY     left leg    Social History: Social History   Socioeconomic History  . Marital status: Married    Spouse name: Not on file  . Number of children: 1  . Years of education: Not on file  . Highest education level: 11th grade  Occupational History  . Occupation: Retired  Tobacco Use  . Smoking  status: Former Smoker    Packs/day: 1.00    Years: 27.00    Pack years: 27.00    Types: Cigarettes    Quit date: 04/20/1983    Years since quitting: 37.1  . Smokeless tobacco: Never Used  . Tobacco comment: Quit 25 years ago  Vaping Use  . Vaping Use: Never used  Substance and Sexual Activity  . Alcohol use: No  . Drug use: No  . Sexual activity: Yes  Other Topics Concern  . Not  on file  Social History Narrative   ** Merged History Encounter **       Married.  Lives with wife.  Grandson stays with them some.     Social Determinants of Health   Financial Resource Strain: Not on file  Food Insecurity: Not on file  Transportation Needs: Not on file  Physical Activity: Not on file  Stress: Not on file  Social Connections: Not on file  Intimate Partner Violence: Not on file    Family History: Family History  Problem Relation Age of Onset  . Heart attack Sister        Sudden death in her 17s.  No clear as to the cause  . Heart disease Brother        Valvular  . Heart disease Mother 98       stent, lived to be 39  . Cancer Father        ?lung  . Early death Brother 3       spinal menigitis  . Diabetes Other     Review of Systems: Review of Systems  Constitutional: Negative.   Respiratory: Negative.   Cardiovascular: Negative.   Gastrointestinal: Negative.   Neurological: Negative.     Physical Exam: Vital Signs BP (!) 148/83 (BP Location: Left Arm, Patient Position: Sitting, Cuff Size: Normal)   Pulse 66   Ht _0  (1.778 m)   Wt 164 lb (74.4 kg)   SpO2 97%   BMI 23.53 kg/m   Physical Exam Constitutional:      General: Not in acute distress.    Appearance: Normal appearance. Not ill-appearing.  HENT:     Head: Normocephalic and atraumatic.  Pedunculated mass on left side of nose with ulcerative center. Eyes:     Pupils: Pupils are equal, round Neck:     Musculoskeletal: Normal range of motion.  Cardiovascular:     Rate and Rhythm: Normal rate and  regular rhythm.     Pulses: Normal pulses.     Heart sounds: Normal heart sounds. No murmur.  Pulmonary:     Effort: Pulmonary effort is normal. No respiratory distress.     Breath sounds: Normal breath sounds. No wheezing.  Abdominal:     General: Abdomen is flat. There is no distension.     Palpations: Abdomen is soft.     Tenderness: There is no abdominal tenderness.  Musculoskeletal: Normal range of motion.  Skin:    General: Skin is warm and dry.     Findings: No erythema or rash.  Neurological:     General: No focal deficit present.     Mental Status: Alert and oriented to person, place, and time. Mental status is at baseline.     Motor: No weakness.  Psychiatric:        Mood and Affect: Mood normal.        Behavior: Behavior normal.    Assessment/Plan: The patient is scheduled for left nasal reconstruction with adjacent tissue transfer and possible full-thickness skin graft to nose with Dr. Claudia Desanctis.  Risks, benefits, and alternatives of procedure discussed, questions answered and consent obtained.    Smoking Status: Non-smoker, quit 30 years ago; Counseling Given?  N/A  Caprini Score: 5, high; Risk Factors include: Age and length of planned surgery. Recommendation for mechanical and pharmacological prophylaxis per Caprini guidelines.. Encourage early ambulation.   Pictures obtained: Today at consult  Post-op Rx sent to pharmacy: Tramadol, Zofran.  Patient reports he would like to use Tylenol  for postop pain, but with use tramadol as needed for severe pain.  Patient is aware to stop Plavix On 06/15/2020.  He is going to remain on aspirin per cardiology recommendations.  Patient reports that he was instructed not to restart Plavix after surgery by cardiology.  I recommend he stop any over-the-counter supplements that he takes including garlic, cinnamon, multivitamins.  He can restart these postoperatively.  Patient is in agreement with this. Clearance for surgery received from PCP  and cardiology.  Patient was provided with the General Surgical Risk consent document and Pain Medication Agreement prior to their appointment.  They had adequate time to read through the risk consent documents and Pain Medication Agreement. We also discussed them in person together during this preop appointment. All of their questions were answered to their satisfaction.  Recommended calling if they have any further questions.  Risk consent form and Pain Medication Agreement to be scanned into patient's chart.  The risks that can be encountered with and after a skin graft were discussed and include the following but not limited to these: bleeding, infection, delayed healing, anesthesia risks, skin sensation changes, injury to structures including nerves, blood vessels, and muscles which may be temporary or permanent, allergies to tape, suture materials and glues, blood products, topical preparations or injected agents, skin contour irregularities, skin discoloration and swelling, deep vein thrombosis, cardiac and pulmonary complications, pain, which may persist, failure of the graft and possible need for revisional surgery or staged procedures.  We discussed the possibility of needing to take cartilage graft from his posterior ear if the Mohs excision invades any cartilage of his nose.  Patient is understanding of this and in agreement with this.   Electronically signed by: Carola Rhine Scheeler, PA-C 06/11/2020 9:44 AM

## 2020-06-17 ENCOUNTER — Other Ambulatory Visit (HOSPITAL_COMMUNITY)
Admission: RE | Admit: 2020-06-17 | Discharge: 2020-06-17 | Disposition: A | Discharge status: Home / Self Care | Payer: Medicare PPO | Source: Ambulatory Visit | Attending: Plastic Surgery | Admitting: Plastic Surgery

## 2020-06-17 DIAGNOSIS — Z20822 Contact with and (suspected) exposure to covid-19: Secondary | ICD-10-CM | POA: Insufficient documentation

## 2020-06-17 DIAGNOSIS — Z01812 Encounter for preprocedural laboratory examination: Secondary | ICD-10-CM | POA: Diagnosis not present

## 2020-06-17 LAB — SARS CORONAVIRUS 2 (TAT 6-24 HRS): SARS Coronavirus 2: NEGATIVE

## 2020-06-19 ENCOUNTER — Other Ambulatory Visit: Payer: Self-pay

## 2020-06-19 ENCOUNTER — Encounter (HOSPITAL_COMMUNITY): Payer: Self-pay | Admitting: Plastic Surgery

## 2020-06-19 DIAGNOSIS — C44321 Squamous cell carcinoma of skin of nose: Secondary | ICD-10-CM | POA: Diagnosis not present

## 2020-06-19 NOTE — Progress Notes (Signed)
PCP Livia Snellen, MD Cardiologist - Percival Spanish, MD   Chest x-ray -  EKG - 06/06/20 Stress Test -  ECHO - 08/19/18 Cardiac Cath - 08/25/18  Fasting Blood Sugar:  130-140s Checks Blood Sugar:  Every other day  Blood Thinner Instructions: Plavix last dose 06/14/20 Aspirin Instructions: ASA last dose 06/19/31   COVID TEST- 06/17/20 negative   Anesthesia review: n/a  -------------  SDW INSTRUCTIONS:  Your procedure is scheduled on Friday 06/20/20. Please report to [facility] Main Entrance "A" at [place].M., and check in at the Admitting office. Call this number if you have problems the morning of surgery: [phone]   Remember: Do not eat or drink after midnight the night before your surgery   Medications to take morning of surgery with a sip of water include: amLODipine (NORVASC) fluticasone (FLONASE)  levothyroxine (SYNTHROID)  metoprolol tartrate (LOPRESSOR)   If needed: acetaminophen (TYLENOL)  ondansetron (ZOFRAN)  ** PLEASE check your blood sugar the morning of your surgery when you wake up and every 2 hours until you get to the Short Stay unit.  If your blood sugar is less than 70 mg/dL, you will need to treat for low blood sugar: - Do not take insulin. - Treat a low blood sugar (less than 70 mg/dL) with  cup of clear juice (cranberry or apple), 4 glucose tablets, OR glucose gel. - Recheck blood sugar in 15 minutes after treatment (to make sure it is greater than 70 mg/dL). If your blood sugar is not greater than 70 mg/dL on recheck, call [phone] for further instructions.  insulin detemir (LEVEMIR) 3/4: 30 units  metFORMIN (GLUCOPHAGE)  3/4: none  Semaglutide 3/4: none - per pt he only takes every Wednesday  As of today, STOP taking any Aspirin (unless otherwise instructed by your surgeon), Aleve, Naproxen, Ibuprofen, Motrin, Advil, Goody's, BC's, all herbal medications, fish oil, and all vitamins.    The Morning of Surgery Do not wear jewelry Do not wear lotions,  powders, colognes, or deodorant  Men may shave face and neck. Do not bring valuables to the hospital. [facility] is not responsible for any belongings or valuables. If you are a smoker, DO NOT Smoke 24 hours prior to surgery If you wear a CPAP at night please bring your mask the morning of surgery  Remember that you must have someone to transport you home after your surgery, and remain with you for 24 hours if you are discharged the same day. Please bring cases for contacts, glasses, hearing aids, dentures or bridgework because it cannot be worn into surgery.   Patients discharged the day of surgery will not be allowed to drive home.   Please shower the NIGHT BEFORE SURGERY and the MORNING OF SURGERY with DIAL Soap. Wear comfortable clothes the morning of surgery. Oral Hygiene is also important to reduce your risk of infection.  Remember - BRUSH YOUR TEETH THE MORNING OF SURGERY WITH YOUR REGULAR TOOTHPASTE  Patient denies shortness of breath, fever, cough and chest pain.

## 2020-06-20 ENCOUNTER — Ambulatory Visit (HOSPITAL_COMMUNITY): Payer: Medicare PPO | Admitting: Anesthesiology

## 2020-06-20 ENCOUNTER — Encounter (HOSPITAL_COMMUNITY): Payer: Self-pay | Admitting: Plastic Surgery

## 2020-06-20 ENCOUNTER — Encounter (HOSPITAL_COMMUNITY)
Admission: RE | Disposition: A | Discharge status: Home / Self Care | Payer: Self-pay | Source: Home / Self Care | Attending: Plastic Surgery

## 2020-06-20 ENCOUNTER — Ambulatory Visit (HOSPITAL_COMMUNITY)
Admission: RE | Admit: 2020-06-20 | Discharge: 2020-06-20 | Disposition: A | Discharge status: Home / Self Care | Payer: Medicare PPO | Attending: Plastic Surgery | Admitting: Plastic Surgery

## 2020-06-20 DIAGNOSIS — Z7902 Long term (current) use of antithrombotics/antiplatelets: Secondary | ICD-10-CM | POA: Insufficient documentation

## 2020-06-20 DIAGNOSIS — Z7982 Long term (current) use of aspirin: Secondary | ICD-10-CM | POA: Diagnosis not present

## 2020-06-20 DIAGNOSIS — I509 Heart failure, unspecified: Secondary | ICD-10-CM | POA: Diagnosis not present

## 2020-06-20 DIAGNOSIS — Z794 Long term (current) use of insulin: Secondary | ICD-10-CM | POA: Diagnosis not present

## 2020-06-20 DIAGNOSIS — M950 Acquired deformity of nose: Secondary | ICD-10-CM | POA: Diagnosis not present

## 2020-06-20 DIAGNOSIS — I2510 Atherosclerotic heart disease of native coronary artery without angina pectoris: Secondary | ICD-10-CM | POA: Insufficient documentation

## 2020-06-20 DIAGNOSIS — I110 Hypertensive heart disease with heart failure: Secondary | ICD-10-CM | POA: Insufficient documentation

## 2020-06-20 DIAGNOSIS — Z8249 Family history of ischemic heart disease and other diseases of the circulatory system: Secondary | ICD-10-CM | POA: Diagnosis not present

## 2020-06-20 DIAGNOSIS — Z79899 Other long term (current) drug therapy: Secondary | ICD-10-CM | POA: Insufficient documentation

## 2020-06-20 DIAGNOSIS — Z8674 Personal history of sudden cardiac arrest: Secondary | ICD-10-CM | POA: Insufficient documentation

## 2020-06-20 DIAGNOSIS — Z87891 Personal history of nicotine dependence: Secondary | ICD-10-CM | POA: Insufficient documentation

## 2020-06-20 DIAGNOSIS — Z955 Presence of coronary angioplasty implant and graft: Secondary | ICD-10-CM | POA: Insufficient documentation

## 2020-06-20 DIAGNOSIS — Z888 Allergy status to other drugs, medicaments and biological substances status: Secondary | ICD-10-CM | POA: Insufficient documentation

## 2020-06-20 DIAGNOSIS — I5031 Acute diastolic (congestive) heart failure: Secondary | ICD-10-CM | POA: Diagnosis not present

## 2020-06-20 DIAGNOSIS — D0439 Carcinoma in situ of skin of other parts of face: Secondary | ICD-10-CM | POA: Diagnosis not present

## 2020-06-20 DIAGNOSIS — E119 Type 2 diabetes mellitus without complications: Secondary | ICD-10-CM | POA: Insufficient documentation

## 2020-06-20 DIAGNOSIS — E785 Hyperlipidemia, unspecified: Secondary | ICD-10-CM | POA: Diagnosis not present

## 2020-06-20 DIAGNOSIS — I4819 Other persistent atrial fibrillation: Secondary | ICD-10-CM | POA: Diagnosis not present

## 2020-06-20 DIAGNOSIS — Z8522 Personal history of malignant neoplasm of nasal cavities, middle ear, and accessory sinuses: Secondary | ICD-10-CM | POA: Diagnosis not present

## 2020-06-20 DIAGNOSIS — Z421 Encounter for breast reconstruction following mastectomy: Secondary | ICD-10-CM

## 2020-06-20 HISTORY — PX: ADJACENT TISSUE TRANSFER/TISSUE REARRANGEMENT: SHX6829

## 2020-06-20 HISTORY — PX: SKIN FULL THICKNESS GRAFT: SHX442

## 2020-06-20 LAB — GLUCOSE, CAPILLARY
Glucose-Capillary: 128 mg/dL — ABNORMAL HIGH (ref 70–99)
Glucose-Capillary: 123 mg/dL — ABNORMAL HIGH (ref 70–99)

## 2020-06-20 LAB — BASIC METABOLIC PANEL
Anion gap: 9 (ref 5–15)
BUN: 12 mg/dL (ref 8–23)
CO2: 23 mmol/L (ref 22–32)
Calcium: 9.1 mg/dL (ref 8.9–10.3)
Chloride: 105 mmol/L (ref 98–111)
Creatinine, Ser: 0.77 mg/dL (ref 0.61–1.24)
GFR, Estimated: >60 mL/min (ref >60)
Glucose, Bld: 113 mg/dL — ABNORMAL HIGH (ref 70–99)
Potassium: 3.8 mmol/L (ref 3.5–5.1)
Sodium: 137 mmol/L (ref 135–145)

## 2020-06-20 LAB — CBC
HCT: 41 % (ref 39.0–52.0)
Hemoglobin: 13.3 g/dL (ref 13.0–17.0)
MCH: 28.4 pg (ref 26.0–34.0)
MCHC: 32.4 g/dL (ref 30.0–36.0)
MCV: 87.4 fL (ref 80.0–100.0)
Platelets: 231 10*3/uL (ref 150–400)
RBC: 4.69 MIL/uL (ref 4.22–5.81)
RDW: 14.4 % (ref 11.5–15.5)
WBC: 11.4 10*3/uL — ABNORMAL HIGH (ref 4.0–10.5)
nRBC: 0 % (ref 0.0–0.2)

## 2020-06-20 SURGERY — ADJACENT TISSUE TRANSFER
Anesthesia: General | Site: Nose | Laterality: Left

## 2020-06-20 MED ORDER — NEOSTIGMINE METHYLSULFATE 10 MG/10ML IV SOLN
INTRAVENOUS | Status: DC | PRN
  Administered 2020-06-20: 2 mg via INTRAVENOUS

## 2020-06-20 MED ORDER — ORAL CARE MOUTH RINSE: 15.0000 mL | Freq: Once | OROMUCOSAL | Status: AC

## 2020-06-20 MED ORDER — PROPOFOL 10 MG/ML IV BOLUS
INTRAVENOUS | Status: AC
  Filled 2020-06-20: qty 20

## 2020-06-20 MED ORDER — EPHEDRINE SULFATE-NACL 50-0.9 MG/10ML-% IV SOSY
PREFILLED_SYRINGE | INTRAVENOUS | Status: DC | PRN
  Administered 2020-06-20: 5 mg via INTRAVENOUS

## 2020-06-20 MED ORDER — ROCURONIUM BROMIDE 10 MG/ML (PF) SYRINGE
PREFILLED_SYRINGE | INTRAVENOUS | Status: DC | PRN
  Administered 2020-06-20: 40 mg via INTRAVENOUS

## 2020-06-20 MED ORDER — PROPOFOL 10 MG/ML IV BOLUS
INTRAVENOUS | Status: DC | PRN
  Administered 2020-06-20: 150 mg via INTRAVENOUS

## 2020-06-20 MED ORDER — ESMOLOL HCL 100 MG/10ML IV SOLN
INTRAVENOUS | Status: DC | PRN
  Administered 2020-06-20: 20 mg via INTRAVENOUS

## 2020-06-20 MED ORDER — ONDANSETRON HCL 4 MG/2ML IJ SOLN: 4.0000 mg | Freq: Once | INTRAMUSCULAR | Status: DC | PRN

## 2020-06-20 MED ORDER — FENTANYL CITRATE (PF) 100 MCG/2ML IJ SOLN: 25.0000 ug | INTRAMUSCULAR | Status: DC | PRN

## 2020-06-20 MED ORDER — LIDOCAINE 2% (20 MG/ML) 5 ML SYRINGE
INTRAMUSCULAR | Status: AC
  Filled 2020-06-20: qty 5

## 2020-06-20 MED ORDER — BACITRACIN ZINC 500 UNIT/GM EX OINT
TOPICAL_OINTMENT | CUTANEOUS | Status: DC | PRN
  Administered 2020-06-20: 1 via TOPICAL

## 2020-06-20 MED ORDER — DEXAMETHASONE SODIUM PHOSPHATE 10 MG/ML IJ SOLN
INTRAMUSCULAR | Status: DC | PRN
  Administered 2020-06-20: 5 mg via INTRAVENOUS

## 2020-06-20 MED ORDER — LIDOCAINE 2% (20 MG/ML) 5 ML SYRINGE
INTRAMUSCULAR | Status: DC | PRN
  Administered 2020-06-20: 60 mg via INTRAVENOUS

## 2020-06-20 MED ORDER — SUCCINYLCHOLINE CHLORIDE 200 MG/10ML IV SOSY
PREFILLED_SYRINGE | INTRAVENOUS | Status: AC
  Filled 2020-06-20: qty 10

## 2020-06-20 MED ORDER — ALBUMIN HUMAN 5 % IV SOLN: 12.5000 g | Freq: Once | INTRAVENOUS | Status: AC

## 2020-06-20 MED ORDER — GLYCOPYRROLATE 0.2 MG/ML IJ SOLN
INTRAMUSCULAR | Status: DC | PRN
  Administered 2020-06-20: .2 mg via INTRAVENOUS

## 2020-06-20 MED ORDER — ACETAMINOPHEN 10 MG/ML IV SOLN: 1000.0000 mg | Freq: Once | INTRAVENOUS | Status: DC | PRN

## 2020-06-20 MED ORDER — BACITRACIN ZINC 500 UNIT/GM EX OINT
TOPICAL_OINTMENT | CUTANEOUS | Status: AC
  Filled 2020-06-20: qty 28.35

## 2020-06-20 MED ORDER — CEFAZOLIN SODIUM-DEXTROSE 2-4 GM/100ML-% IV SOLN
INTRAVENOUS | Status: AC
  Filled 2020-06-20: qty 100

## 2020-06-20 MED ORDER — CEFAZOLIN SODIUM-DEXTROSE 2-4 GM/100ML-% IV SOLN: 2.0000 g | INTRAVENOUS | Status: DC

## 2020-06-20 MED ORDER — EPHEDRINE 5 MG/ML INJ
INTRAVENOUS | Status: AC
  Filled 2020-06-20: qty 10

## 2020-06-20 MED ORDER — LIDOCAINE-EPINEPHRINE 1 %-1:100000 IJ SOLN
INTRAMUSCULAR | Status: DC | PRN
  Administered 2020-06-20: 40 mL

## 2020-06-20 MED ORDER — BSS IO SOLN
INTRAOCULAR | Status: AC
  Filled 2020-06-20: qty 15

## 2020-06-20 MED ORDER — CHLORHEXIDINE GLUCONATE 0.12 % MT SOLN
15.0000 mL | Freq: Once | OROMUCOSAL | Status: AC
  Administered 2020-06-20: 15 mL via OROMUCOSAL
  Filled 2020-06-20: qty 15

## 2020-06-20 MED ORDER — 0.9 % SODIUM CHLORIDE (POUR BTL) OPTIME
TOPICAL | Status: DC | PRN
  Administered 2020-06-20: 1000 mL

## 2020-06-20 MED ORDER — ALBUMIN HUMAN 5 % IV SOLN
INTRAVENOUS | Status: AC
  Administered 2020-06-20: 12.5 g via INTRAVENOUS
  Filled 2020-06-20: qty 250

## 2020-06-20 MED ORDER — PHENYLEPHRINE HCL-NACL 10-0.9 MG/250ML-% IV SOLN
INTRAVENOUS | Status: DC | PRN
  Administered 2020-06-20: 10 ug/min via INTRAVENOUS

## 2020-06-20 MED ORDER — BSS IO SOLN
INTRAOCULAR | Status: DC | PRN
  Administered 2020-06-20: 1 via INTRAOCULAR

## 2020-06-20 MED ORDER — CEFAZOLIN SODIUM-DEXTROSE 2-3 GM-%(50ML) IV SOLR
INTRAVENOUS | Status: DC | PRN
  Administered 2020-06-20: 2 g via INTRAVENOUS

## 2020-06-20 MED ORDER — ESMOLOL HCL 100 MG/10ML IV SOLN
INTRAVENOUS | Status: AC
  Filled 2020-06-20: qty 10

## 2020-06-20 MED ORDER — ONDANSETRON HCL 4 MG/2ML IJ SOLN
INTRAMUSCULAR | Status: DC | PRN
  Administered 2020-06-20: 4 mg via INTRAVENOUS

## 2020-06-20 MED ORDER — ROCURONIUM BROMIDE 10 MG/ML (PF) SYRINGE
PREFILLED_SYRINGE | INTRAVENOUS | Status: AC
  Filled 2020-06-20: qty 10

## 2020-06-20 MED ORDER — FENTANYL CITRATE (PF) 250 MCG/5ML IJ SOLN
INTRAMUSCULAR | Status: AC
  Filled 2020-06-20: qty 5

## 2020-06-20 MED ORDER — LIDOCAINE-EPINEPHRINE 1 %-1:100000 IJ SOLN
INTRAMUSCULAR | Status: AC
  Filled 2020-06-20: qty 1

## 2020-06-20 MED ORDER — LACTATED RINGERS IV SOLN: INTRAVENOUS | Status: DC

## 2020-06-20 MED ORDER — FENTANYL CITRATE (PF) 250 MCG/5ML IJ SOLN
INTRAMUSCULAR | Status: DC | PRN
  Administered 2020-06-20: 150 ug via INTRAVENOUS
  Administered 2020-06-20: 50 ug via INTRAVENOUS

## 2020-06-20 SURGICAL SUPPLY — 102 items
ADH SKN CLS APL DERMABOND .7 (GAUZE/BANDAGES/DRESSINGS)
APL PRP STRL LF DISP 70% ISPRP (MISCELLANEOUS)
BAG DECANTER FOR FLEXI CONT (MISCELLANEOUS) ×6 IMPLANT
BLADE CLIPPER SURG (BLADE) IMPLANT
BLADE DERMATOME SS (BLADE) ×2 IMPLANT
BLADE SURG 15 STRL LF DISP TIS (BLADE) ×2 IMPLANT
BLADE SURG 15 STRL SS (BLADE) ×3
BNDG COHESIVE 4X5 TAN STRL (GAUZE/BANDAGES/DRESSINGS) IMPLANT
BNDG ELASTIC 4X5.8 VLCR STR LF (GAUZE/BANDAGES/DRESSINGS) IMPLANT
BNDG ELASTIC 6X5.8 VLCR STR LF (GAUZE/BANDAGES/DRESSINGS) IMPLANT
BNDG GAUZE ELAST 4 BULKY (GAUZE/BANDAGES/DRESSINGS) IMPLANT
BRUSH SCRUB EZ PLAIN DRY (MISCELLANEOUS) ×4 IMPLANT
BUR ROUND FLUTED 5 RND (BURR) IMPLANT
CANISTER SUCT 1200ML W/VALVE (MISCELLANEOUS) ×3 IMPLANT
CANISTER SUCT 3000ML PPV (MISCELLANEOUS) IMPLANT
CANISTER WOUND CARE 500ML ATS (WOUND CARE) IMPLANT
CHLORAPREP W/TINT 26 (MISCELLANEOUS) ×2 IMPLANT
COVER BACK TABLE 60X90IN (DRAPES) ×2 IMPLANT
COVER MAYO STAND STRL (DRAPES) ×2 IMPLANT
COVER SURGICAL LIGHT HANDLE (MISCELLANEOUS) ×3 IMPLANT
COVER WAND RF STERILE (DRAPES) IMPLANT
DERMABOND ADVANCED (GAUZE/BANDAGES/DRESSINGS)
DERMABOND ADVANCED .7 DNX12 (GAUZE/BANDAGES/DRESSINGS) IMPLANT
DERMACARRIERS GRAFT 1 TO 1.5 (DISPOSABLE)
DRAIN CHANNEL 15F RND FF W/TCR (WOUND CARE) IMPLANT
DRAPE EXTREMITY T 121X128X90 (DISPOSABLE) IMPLANT
DRAPE HALF SHEET 40X57 (DRAPES) ×2 IMPLANT
DRAPE INCISE IOBAN 66X45 STRL (DRAPES) ×2 IMPLANT
DRAPE ORTHO SPLIT 77X108 STRL (DRAPES)
DRAPE SURG ORHT 6 SPLT 77X108 (DRAPES) ×4 IMPLANT
DRAPE U-SHAPE 76X120 STRL (DRAPES) IMPLANT
DRESSING MEPILEX FLEX 4X4 (GAUZE/BANDAGES/DRESSINGS) IMPLANT
DRSG CALCIUM ALGINATE 4X4 (GAUZE/BANDAGES/DRESSINGS) ×2 IMPLANT
DRSG MEPILEX FLEX 4X4 (GAUZE/BANDAGES/DRESSINGS) ×3
DRSG MEPITEL 4X7.2 (GAUZE/BANDAGES/DRESSINGS) IMPLANT
DRSG OPSITE 6X11 MED (GAUZE/BANDAGES/DRESSINGS) IMPLANT
DRSG PAD ABDOMINAL 8X10 ST (GAUZE/BANDAGES/DRESSINGS) ×2 IMPLANT
DRSG VAC ATS LRG SENSATRAC (GAUZE/BANDAGES/DRESSINGS) IMPLANT
DRSG VAC ATS MED SENSATRAC (GAUZE/BANDAGES/DRESSINGS) IMPLANT
DRSG VAC ATS SM SENSATRAC (GAUZE/BANDAGES/DRESSINGS) IMPLANT
ELECT CAUTERY BLADE 6.4 (BLADE) IMPLANT
ELECT COATED BLADE 2.86 ST (ELECTRODE) IMPLANT
ELECT NDL BLADE 2-5/6 (NEEDLE) IMPLANT
ELECT NEEDLE BLADE 2-5/6 (NEEDLE) IMPLANT
ELECT REM PT RETURN 9FT ADLT (ELECTROSURGICAL)
ELECTRODE REM PT RTRN 9FT ADLT (ELECTROSURGICAL) ×2 IMPLANT
FILTER STRAW FLUID ASPIR (MISCELLANEOUS) ×2 IMPLANT
GAUZE SPONGE 4X4 12PLY STRL (GAUZE/BANDAGES/DRESSINGS) ×4 IMPLANT
GAUZE SPONGE 4X4 16PLY XRAY LF (GAUZE/BANDAGES/DRESSINGS) ×1 IMPLANT
GAUZE XEROFORM 1X8 LF (GAUZE/BANDAGES/DRESSINGS) ×1 IMPLANT
GAUZE XEROFORM 5X9 LF (GAUZE/BANDAGES/DRESSINGS) ×1 IMPLANT
GLOVE BIOGEL M STRL SZ7.5 (GLOVE) ×6 IMPLANT
GLOVE INDICATOR 8.0 STRL GRN (GLOVE) ×6 IMPLANT
GOWN STRL REUS W/ TWL LRG LVL3 (GOWN DISPOSABLE) ×4 IMPLANT
GOWN STRL REUS W/TWL LRG LVL3 (GOWN DISPOSABLE)
GRAFT DERMACARRIERS 1 TO 1.5 (DISPOSABLE) ×2 IMPLANT
HANDPIECE INTERPULSE COAX TIP (DISPOSABLE)
HEMOSTAT SURGICEL 2X14 (HEMOSTASIS) ×2 IMPLANT
IV NS 1000ML (IV SOLUTION) ×3
IV NS 1000ML BAXH (IV SOLUTION) ×2 IMPLANT
KIT BASIN OR (CUSTOM PROCEDURE TRAY) ×3 IMPLANT
KIT TURNOVER KIT B (KITS) ×3 IMPLANT
MANIFOLD NEPTUNE II (INSTRUMENTS) ×2 IMPLANT
NDL SPNL 18GX3.5 QUINCKE PK (NEEDLE) ×4 IMPLANT
NEEDLE HYPO 22GX1.5 SAFETY (NEEDLE) ×3 IMPLANT
NEEDLE SPNL 18GX3.5 QUINCKE PK (NEEDLE) IMPLANT
NS IRRIG 1000ML POUR BTL (IV SOLUTION) ×3 IMPLANT
PACK GENERAL/GYN (CUSTOM PROCEDURE TRAY) ×2 IMPLANT
PAD ARMBOARD 7.5X6 YLW CONV (MISCELLANEOUS) ×6 IMPLANT
PENCIL SMOKE EVACUATOR (MISCELLANEOUS) ×3 IMPLANT
PIN SAFETY STERILE (MISCELLANEOUS) IMPLANT
SET HNDPC FAN SPRY TIP SCT (DISPOSABLE) IMPLANT
SHEET MEDIUM DRAPE 40X70 STRL (DRAPES) IMPLANT
SPONGE LAP 18X18 RF (DISPOSABLE) IMPLANT
STAPLER VISISTAT 35W (STAPLE) ×2 IMPLANT
STOCKINETTE 6  STRL (DRAPES)
STOCKINETTE 6 STRL (DRAPES) IMPLANT
STRIP CLOSURE SKIN 1/2X4 (GAUZE/BANDAGES/DRESSINGS) IMPLANT
SUT CHROMIC 4 0 PS 2 18 (SUTURE) IMPLANT
SUT ETHILON 2 0 FS 18 (SUTURE) IMPLANT
SUT ETHILON 4 0 PS 2 18 (SUTURE) ×2 IMPLANT
SUT MNCRL AB 3-0 PS2 18 (SUTURE) IMPLANT
SUT MNCRL AB 4-0 PS2 18 (SUTURE) ×5 IMPLANT
SUT MON AB 4-0 PC3 18 (SUTURE) IMPLANT
SUT MON AB 5-0 P3 18 (SUTURE) IMPLANT
SUT MON AB 5-0 PS2 18 (SUTURE) ×2 IMPLANT
SUT PDS AB 2-0 CT2 27 (SUTURE) IMPLANT
SUT PDS AB 3-0 SH 27 (SUTURE) ×2 IMPLANT
SUT PLAIN 5 0 P 3 18 (SUTURE) ×4 IMPLANT
SUT PLAIN GUT FAST 5-0 (SUTURE) ×2 IMPLANT
SUT SILK 2 0 SH (SUTURE) IMPLANT
SUT VIC AB 3-0 PS1 18 (SUTURE)
SUT VIC AB 3-0 PS1 18XBRD (SUTURE) IMPLANT
SUT VICRYL 4-0 PS2 18IN ABS (SUTURE) IMPLANT
SYR 50ML LL SCALE MARK (SYRINGE) ×6 IMPLANT
SYR CONTROL 10ML LL (SYRINGE) ×3 IMPLANT
TOWEL GREEN STERILE (TOWEL DISPOSABLE) ×3 IMPLANT
TOWEL GREEN STERILE FF (TOWEL DISPOSABLE) ×3 IMPLANT
TRAY ENT MC OR (CUSTOM PROCEDURE TRAY) ×3 IMPLANT
TUBE CONNECTING 20X1/4 (TUBING) ×3 IMPLANT
UNDERPAD 30X36 HEAVY ABSORB (UNDERPADS AND DIAPERS) ×3 IMPLANT
WATER STERILE IRR 1000ML POUR (IV SOLUTION) ×1 IMPLANT

## 2020-06-20 NOTE — Transfer of Care (Signed)
Immediate Anesthesia Transfer of Care Note  Patient: [name] [name]  Procedure(s) Performed: Left nasal reconstruction with adjacent tissue transfer (Left Nose) SKIN GRAFT FULL THICKNESS (Left Neck)  Patient Location: PACU  Anesthesia Type:General  Level of Consciousness: drowsy  Airway & Oxygen Therapy: Patient Spontanous Breathing and Patient connected to nasal cannula oxygen  Post-op Assessment: Report given to RN and Post -op Vital signs reviewed and stable  Post vital signs: Reviewed and stable  Last Vitals:  Vitals Value Taken Time  BP 127/67 06/20/20 1221  Temp    Pulse 82 06/20/20 1224  Resp 15 06/20/20 1224  SpO2 95 % 06/20/20 1224  Vitals shown include unvalidated device data.  Last Pain:  Vitals:   06/20/20 0831  TempSrc:   PainSc: 3       Patients Stated Pain Goal: 2 (04/88/89 1694)  Complications: No complications documented.

## 2020-06-20 NOTE — Interval H&P Note (Signed)
History and Physical Interval Note:  06/20/2020 9:51 AM  [name] [name]  has presented today for surgery, with the diagnosis of mohs defect of nose.  The various methods of treatment have been discussed with the patient and family. After consideration of risks, benefits and other options for treatment, the patient has consented to  Procedure(s) with comments: Left nasal reconstruction with adjacent tissue transfer (Left) - 90 min, please SKIN GRAFT FULL THICKNESS (Left) as a surgical intervention.  The patient's history has been reviewed, patient examined, no change in status, stable for surgery.  I have reviewed the patient's chart and labs.  Questions were answered to the patient's satisfaction.     [name]

## 2020-06-20 NOTE — Discharge Instructions (Signed)
Activity: As tolerated, but avoid strenuous activity until follow up visit.  Diet: Regular  Wound Care: Keep dressings clean and dry until your follow up appointment next week.  Avoid getting the yellow facial dressing wet.  Redress the wound as needed for comfort.  If any bleeding occurs hold pressure for 20 minutes.  Special Instructions:  Call our office if any unusual problems occur such as pain, excessive bleeding, unrelieved nausea/vomiting, fever &/or chills.  Follow-up appointment: Scheduled for next week.

## 2020-06-20 NOTE — Brief Op Note (Signed)
06/20/2020  12:26 PM  PATIENT:  [name] [name]  80 y.o. male  PRE-OPERATIVE DIAGNOSIS:  mohs defect of nose  POST-OPERATIVE DIAGNOSIS:  mohs defect of nose  PROCEDURE:  Procedure(s) with comments: Left nasal reconstruction with adjacent tissue transfer (Left) - 90 min, please SKIN GRAFT FULL THICKNESS (Left)  SURGEON:  Surgeon(s) and Role:    * Krystl Wickware, Steffanie Dunn, MD - Primary  PHYSICIAN ASSISTANT: [place], RNFA  ASSISTANTS: none   ANESTHESIA:   general  EBL:  15 mL   BLOOD ADMINISTERED:none  DRAINS: none   LOCAL MEDICATIONS USED:  LIDOCAINE   SPECIMEN:  No Specimen  DISPOSITION OF SPECIMEN:  PATHOLOGY  COUNTS:  YES  TOURNIQUET:  * No tourniquets in log *  DICTATION: .Dragon Dictation  PLAN OF CARE: Discharge to home after PACU  PATIENT DISPOSITION:  PACU - hemodynamically stable.   Delay start of Pharmacological VTE agent (>24hrs) due to surgical blood loss or risk of bleeding: not applicable

## 2020-06-20 NOTE — Anesthesia Procedure Notes (Signed)
Procedure Name: Intubation Date/Time: 06/20/2020 10:25 AM Performed by: Hoy Morn, CRNA Pre-anesthesia Checklist: Patient identified, Emergency Drugs available, Suction available and Patient being monitored Patient Re-evaluated:Patient Re-evaluated prior to induction Oxygen Delivery Method: Circle system utilized Preoxygenation: Pre-oxygenation with 100% oxygen Induction Type: IV induction Ventilation: Mask ventilation without difficulty Laryngoscope Size: Miller and 2 Grade View: Grade I Tube type: Oral Tube size: 7.5 mm Number of attempts: 1 Airway Equipment and Method: Stylet and Oral airway Placement Confirmation: ETT inserted through vocal cords under direct vision,  positive ETCO2 and breath sounds checked- equal and bilateral Secured at: 24 cm Tube secured with: Tape Dental Injury: Teeth and Oropharynx as per pre-operative assessment

## 2020-06-20 NOTE — Op Note (Signed)
Operative Note   DATE OF OPERATION: 06/20/2020  SURGICAL DEPARTMENT: Plastic Surgery  PREOPERATIVE DIAGNOSES: Mohs defect involving left nasal sidewall and left infraorbital area 6 x 4 cm  POSTOPERATIVE DIAGNOSES:  same  PROCEDURE: 1.  Surgical preparation for coverage of the left nasal and infraorbital Mohs defect totaling 6 x 4 cm 2.  Closure of left infraorbital portion of the defect with adjacent tissue transfer and cheek advancement flap.  This encompassed 10 x 8 cm including the primary and secondary defects 3.  Full-thickness skin graft to left nasal sidewall portion of the defect totaling 3.5 x 2.5 centimeters  SURGEON: Talmadge Coventry, MD  ASSISTANT: [place], RNFA The advanced practice practitioner (APP) assisted throughout the case.  The APP was essential in retraction and counter traction when needed to make the case progress smoothly.  This retraction and assistance made it possible to see the tissue plans for the procedure.  The assistance was needed for blood control, tissue re-approximation and assisted with closure of the incision site.  ANESTHESIA:  General.   COMPLICATIONS: None.   INDICATIONS FOR PROCEDURE:  The patient, [name] [name] is a 80 y.o. male born on Aug 24, 1940, is here for treatment of complex Mohs surgery defect MRN: [id number]  CONSENT:  Informed consent was obtained directly from the patient. Risks, benefits and alternatives were fully discussed. Specific risks including but not limited to bleeding, infection, hematoma, seroma, scarring, pain, contracture, asymmetry, wound healing problems, and need for further surgery were all discussed. The patient did have an ample opportunity to have questions answered to satisfaction.   DESCRIPTION OF PROCEDURE:  The patient was taken to the operating room. SCDs were placed and antibiotics were given.  General anesthesia was administered.  The patient's operative site was prepped and draped in a sterile  fashion. A time out was performed and all information was confirmed to be correct.  The defect encompassed almost the entirety of the left nasal sidewall and extended into the infraorbital area.  It approached but did not involve the medial canthus.  There seem to be ample tissue that would encompass the lacrimal system.  I started by debriding the area with the bevel of the 15 blade and removed any devitalized tissue.  I irrigated it copiously with saline.  At this point over the nasal cartilaginous dorsum and bony dorsum there did appear to be a healthy layer of perichondrium or periosteum that would except a skin graft.  I made the plan of a cheek advancement flap with an incision along the nasolabial fold to close the cheek and infraorbital portion of the defect and shows 2 skin graft the nasal portion.  After drawing out the planned incisions lidocaine with epinephrine was injected throughout the planned flap and also in the left lower lateral neck where the full-thickness skin graft would be harvested from.  This was given time to work.  I then made my incisions with a 15 blade for the cheek advancement and elevated the flap in the subcutaneous plane.  This was advanced to close the entirety of the infraorbital portion of the defect.  The flap was inset with buried 4-0 Monocryl sutures and running 5-0 fast gut for the skin.  This left no downward tension on the lower lid.  This point a full-thickness skin graft was harvested from the left neck using a 15 blade.  The donor site was closed with ruptured buried 4-0 Monocryl sutures in a running 5-0 fast gut.  The graft was  then defatted and inset with combination of Monocryl and fast gut sutures.  Xeroform and nylon were used to form a bolster.  This gave a nice on table result.  Fascial incisions were dressed with bacitracin and the neck was covered with a Mepilex border.  The patient tolerated the procedure well.  There were no complications. The patient was  allowed to wake from anesthesia, extubated and taken to the recovery room in satisfactory condition.

## 2020-06-20 NOTE — Anesthesia Preprocedure Evaluation (Addendum)
Anesthesia Evaluation  Patient identified by MRN, date of birth, ID band Patient awake    Reviewed: Allergy & Precautions, NPO status , Patient's Chart, lab work & pertinent test results  Airway Mallampati: II  TM Distance: >3 FB Neck ROM: Full    Dental  (+) Edentulous Upper, Missing   Pulmonary former smoker,    Pulmonary exam normal breath sounds clear to auscultation       Cardiovascular hypertension, Pt. on home beta blockers and Pt. on medications + CAD, + Cardiac Stents, + Peripheral Vascular Disease and +CHF  Normal cardiovascular exam Rhythm:Regular Rate:Normal  ECG: NSR, rate 72  ECHO: (2020) 1. The left ventricle has normal systolic function, with an ejection fraction of 55-60%.   Neuro/Psych  Neuromuscular disease negative psych ROS   GI/Hepatic Neg liver ROS, hiatal hernia,   Endo/Other  diabetes, Insulin Dependent, Oral Hypoglycemic AgentsHypothyroidism   Renal/GU negative Renal ROS     Musculoskeletal negative musculoskeletal ROS (+)   Abdominal   Peds  Hematology HLD   Anesthesia Other Findings mohs defect of nose  Reproductive/Obstetrics                            Anesthesia Physical Anesthesia Plan  ASA: III  Anesthesia Plan: General   Post-op Pain Management:    Induction: Intravenous  PONV Risk Score and Plan: 2 and Ondansetron, Dexamethasone and Treatment may vary due to age or medical condition  Airway Management Planned: Oral ETT  Additional Equipment:   Intra-op Plan:   Post-operative Plan: Extubation in OR  Informed Consent: I have reviewed the patients History and Physical, chart, labs and discussed the procedure including the risks, benefits and alternatives for the proposed anesthesia with the patient or authorized representative who has indicated his/her understanding and acceptance.     Dental advisory given  Plan Discussed with:  CRNA  Anesthesia Plan Comments:        Anesthesia Quick Evaluation

## 2020-06-21 ENCOUNTER — Encounter (HOSPITAL_COMMUNITY): Payer: Self-pay | Admitting: Plastic Surgery

## 2020-06-21 NOTE — Anesthesia Postprocedure Evaluation (Signed)
Anesthesia Post Note  Patient: [name] [name]  Procedure(s) Performed: Left nasal reconstruction with adjacent tissue transfer (Left Nose) SKIN GRAFT FULL THICKNESS (Left Neck)     Patient location during evaluation: PACU Anesthesia Type: General Level of consciousness: awake Pain management: pain level controlled Vital Signs Assessment: post-procedure vital signs reviewed and stable Respiratory status: spontaneous breathing, nonlabored ventilation, respiratory function stable and patient connected to nasal cannula oxygen Cardiovascular status: blood pressure returned to baseline and stable Postop Assessment: no apparent nausea or vomiting Anesthetic complications: no   No complications documented.  Last Vitals:  Vitals:   06/20/20 1335 06/20/20 1345  BP: 133/73 137/77  Pulse: 82 81  Resp: 16 16  Temp:  36.5 C  SpO2: 99% 95%    Last Pain:  Vitals:   06/20/20 1345  TempSrc:   PainSc: 0-No pain                 Ryan P Ellender

## 2020-06-26 ENCOUNTER — Ambulatory Visit (INDEPENDENT_AMBULATORY_CARE_PROVIDER_SITE_OTHER): Payer: Medicare PPO | Admitting: Plastic Surgery

## 2020-06-26 ENCOUNTER — Other Ambulatory Visit: Payer: Self-pay

## 2020-06-26 ENCOUNTER — Encounter: Payer: Self-pay | Admitting: Plastic Surgery

## 2020-06-26 VITALS — BP 135/77 | HR 67

## 2020-06-26 DIAGNOSIS — D0439 Carcinoma in situ of skin of other parts of face: Secondary | ICD-10-CM

## 2020-06-26 NOTE — Progress Notes (Signed)
Patient presents 1 week postop from cheek advancement and full-thickness skin graft to the nasal sidewall for Mohs defect.  He feels reasonably well although the bolster dressing has been bothering him.  It was removed today revealing an adherent skin graft.  The color is a bit purple for this point but it does seem to have stepped down.  The donor site is healed fine.  The cheek flap is viable and looks to be doing well.  We will plan to see him again in 2 weeks.  I gave him instructions on wound care.  All his questions were answered.

## 2020-07-10 ENCOUNTER — Other Ambulatory Visit: Payer: Self-pay

## 2020-07-10 ENCOUNTER — Encounter: Payer: Self-pay | Admitting: Plastic Surgery

## 2020-07-10 ENCOUNTER — Ambulatory Visit (INDEPENDENT_AMBULATORY_CARE_PROVIDER_SITE_OTHER): Payer: Medicare PPO | Admitting: Plastic Surgery

## 2020-07-10 VITALS — BP 134/73 | HR 71

## 2020-07-10 DIAGNOSIS — D0439 Carcinoma in situ of skin of other parts of face: Secondary | ICD-10-CM

## 2020-07-10 NOTE — Progress Notes (Signed)
Patient presents postop from a left cheek and nasal reconstruction for skin cancer.  I did a cheek advancement flap and closed the nasal portion of the defect with a skin graft.  There is a couple areas of the skin graft that look to have poor take in the scab in those areas but otherwise everything looks to be healing nicely.  He does complain of the cheek flap being a bit numb but says that the area of numbness is gradually getting smaller with time.  On exam his lower eyelid is in good position there is no signs of scleral irritation.  The scabbed areas are getting smaller compared to last visit.  His neck donor site is healing fine.  I will plan to see him again in a few weeks time to check his progress.  He is going to continue to put Vaseline over the scabs.  He knows if he has any questions or problems between now and then he can give [place] a call.

## 2020-07-22 ENCOUNTER — Other Ambulatory Visit: Payer: Self-pay | Admitting: Family Medicine

## 2020-07-22 DIAGNOSIS — Z794 Long term (current) use of insulin: Secondary | ICD-10-CM

## 2020-07-22 DIAGNOSIS — I10 Essential (primary) hypertension: Secondary | ICD-10-CM

## 2020-07-22 DIAGNOSIS — E119 Type 2 diabetes mellitus without complications: Secondary | ICD-10-CM

## 2020-08-13 ENCOUNTER — Ambulatory Visit: Payer: Medicare PPO | Admitting: Plastic Surgery

## 2020-08-13 ENCOUNTER — Other Ambulatory Visit: Payer: Self-pay

## 2020-08-13 DIAGNOSIS — D0439 Carcinoma in situ of skin of other parts of face: Secondary | ICD-10-CM

## 2020-08-13 NOTE — Progress Notes (Signed)
Patient is here postop from a left nasal reconstruction.  I did an advancement flap and a skin graft to the nasal dorsum and sidewall.  He feels like things are going well and has no complaints.  On exam there is good take of the graft and the scars from the advancement flap look to be healing nicely.  There is a little bit of tethering contracture that has elevated his left nostril a little bit and there is a very mild amount of webbing along the lateral nasal radix area from the same process.  These are both fairly mild and do not bother him much.  We discussed scar management including sun protection and silicone scar creams and at this point he is happy enough that I can see him again on an as-needed basis.  All of his questions were answered he knows to call with any concerns.

## 2020-09-01 ENCOUNTER — Other Ambulatory Visit: Payer: Self-pay

## 2020-09-01 ENCOUNTER — Ambulatory Visit: Payer: Medicare PPO | Admitting: Family Medicine

## 2020-09-01 ENCOUNTER — Encounter: Payer: Self-pay | Admitting: Family Medicine

## 2020-09-01 VITALS — BP 113/64 | HR 74 | Temp 97.2°F | Ht 70.0 in | Wt 161.0 lb

## 2020-09-01 DIAGNOSIS — E034 Atrophy of thyroid (acquired): Secondary | ICD-10-CM

## 2020-09-01 DIAGNOSIS — E782 Mixed hyperlipidemia: Secondary | ICD-10-CM

## 2020-09-01 DIAGNOSIS — E119 Type 2 diabetes mellitus without complications: Secondary | ICD-10-CM | POA: Diagnosis not present

## 2020-09-01 DIAGNOSIS — I10 Essential (primary) hypertension: Secondary | ICD-10-CM | POA: Diagnosis not present

## 2020-09-01 DIAGNOSIS — Z794 Long term (current) use of insulin: Secondary | ICD-10-CM | POA: Diagnosis not present

## 2020-09-01 LAB — BAYER DCA HB A1C WAIVED: HB A1C (BAYER DCA - WAIVED): 8.1 % — ABNORMAL HIGH (ref <7.0)

## 2020-09-01 MED ORDER — METFORMIN HCL 1000 MG PO TABS
1000.0000 mg | ORAL_TABLET | Freq: Two times a day (BID) | ORAL | 1 refills | Status: DC
Start: 2020-09-01 — End: 2021-03-05

## 2020-09-01 MED ORDER — IRBESARTAN 150 MG PO TABS: 150.0000 mg | ORAL_TABLET | Freq: Every day | ORAL | 1 refills | Status: DC

## 2020-09-01 MED ORDER — AMLODIPINE BESYLATE 5 MG PO TABS
1.0000 | ORAL_TABLET | Freq: Every day | ORAL | 1 refills | Status: DC
Start: 2020-09-01 — End: 2021-03-05

## 2020-09-01 MED ORDER — ATORVASTATIN CALCIUM 80 MG PO TABS: 80.0000 mg | ORAL_TABLET | Freq: Every day | ORAL | 1 refills | Status: DC

## 2020-09-01 MED ORDER — LEVOTHYROXINE SODIUM 75 MCG PO TABS: 75.0000 ug | ORAL_TABLET | Freq: Every day | ORAL | 1 refills | Status: DC

## 2020-09-01 MED ORDER — METOPROLOL TARTRATE 50 MG PO TABS: 25.0000 mg | ORAL_TABLET | Freq: Every day | ORAL | 1 refills | Status: DC

## 2020-09-01 MED ORDER — FLUTICASONE PROPIONATE 50 MCG/ACT NA SUSP: 2.0000 | Freq: Every day | NASAL | 11 refills | Status: DC | PRN

## 2020-09-01 MED ORDER — CLOPIDOGREL BISULFATE 75 MG PO TABS
ORAL_TABLET | ORAL | 1 refills | Status: DC
Start: 2020-09-01 — End: 2020-12-02

## 2020-09-01 NOTE — Progress Notes (Signed)
Subjective:  Patient ID: [name] [name],  male    DOB: [birth year]  Age: 79 y.o.    CC: Medical Management of Chronic Issues   HPI [name] [name] presents for  follow-up of hypertension. Patient has no history of headache chest pain or shortness of breath or recent cough. Patient also denies symptoms of TIA such as numbness weakness lateralizing. Patient denies side effects from medication. States taking it regularly.  Patient also  in for follow-up of elevated cholesterol. Doing well without complaints on current medication. Denies side effects  including myalgia and arthralgia and nausea. Also in today for liver function testing. Currently no chest pain, shortness of breath or other cardiovascular related symptoms noted.  Follow-up of diabetes. Patient does check blood sugar at home. Readings run between 101 50 mostly.  There are 2 readings in particular 1 that to 81 and 74.  He denies either of these causing low blood sugar reactions.  There are no readings higher than 165 pro postprandially.  Patient denies symptoms such as excessive hunger or urinary frequency, excessive hunger, nausea No significant hypoglycemic spells noted.  It dropped to 67 1 time right before meal last month.  He felt shaky hungry and nauseous and it was relieved quickly by eating. Medications reviewed. Pt reports taking them regularly. Pt. denies complication/adverse reaction today.    follow-up on  thyroid. The patient has a history of hypothyroidism for many years. It has been stable recently. Pt. denies any change in  voice, loss of hair, heat or cold intolerance. Energy level has been adequate to good. Patient denies constipation and diarrhea. No myxedema. Medication is as noted below. Verified that pt is taking it daily on an empty stomach. Well tolerated.  Depression screen [facility] 2/9 09/01/2020 09/01/2020 06/04/2020 03/03/2020 11/28/2019  Decreased Interest 2 0 0 0 0  Down, Depressed, Hopeless 2 0 0 0 0  PHQ - 2  Score 4 0 0 0 0  Altered sleeping 2 - - - -  Tired, decreased energy 2 - - - -  Change in appetite 2 - - - -  Feeling bad or failure about yourself  2 - - - -  Trouble concentrating 2 - - - -  Moving slowly or fidgety/restless 2 - - - -  Suicidal thoughts 2 - - - -  PHQ-9 Score 18 - - - -  Difficult doing work/chores Somewhat difficult - - - -  Some recent data might be hidden   GAD 7 : Generalized Anxiety Score 09/01/2020  Nervous, Anxious, on Edge 1  Control/stop worrying 1  Worry too much - different things 2  Trouble relaxing 2  Restless 2  Easily annoyed or irritable 2  Afraid - awful might happen 2  Total GAD 7 Score 12  Anxiety Difficulty Somewhat difficult     Mr. Okane mentions that his anxiety is based on worry about his grandson who has been in prison and is addicted to drugs.  He does not know why he answered everything when he did he says he is not depressed he denies feeling bad about himself he denies any intent to harm himself he denies feeling as if he be better off dead.  He says he just gets to feeling things out and puts the same number on them.  History [name] [name] has a past medical history of Basal cell carcinoma, CAD (coronary artery disease), Cardiac arrest ([place]), Cataract, Diabetes mellitus without complication ([place]), Diverticulitis, Erectile dysfunction, Gastroesophageal reflux  disease with hiatal hernia, Goiter, nontoxic, multinodular, Hiatal hernia, Hyperlipidemia, Inguinal hernia, Kidney stone (1980), Leg fracture, Liver hemangioma, Nephrolithiasis, and Vitamin D deficiency.   He has a past surgical history that includes LEFT HEART CATH AND CORONARY ANGIOGRAPHY (N/A, 08/25/2018); CORONARY STENT INTERVENTION (N/A, 08/25/2018); Laceration repair (2008); Back surgery (1984); Tibia fracture surgery; Cataract extraction (Bilateral); Eye surgery; Adjacent tissue transfer/tissue rearrangement (Left, 06/20/2020); Full thickness skin graft (Left, 06/20/2020); and skin cancer  removal.   His family history includes Cancer in his father; Diabetes in an other family member; Early death (age of onset: 41) in his brother; Heart attack in his sister; Heart disease in his brother; Heart disease (age of onset: 72) in his mother.He reports that he quit smoking about 37 years ago. His smoking use included cigarettes. He has a 27.00 pack-year smoking history. He has never used smokeless tobacco. He reports that he does not drink alcohol and does not use drugs.  Current Outpatient Medications on File Prior to Visit  Medication Sig Dispense Refill  . Accu-Chek FastClix Lancets MISC Check BS TID Dx E11.9 300 each 3  . acetaminophen (TYLENOL) 325 MG tablet Take 2 tablets (650 mg total) by mouth every 4 (four) hours as needed for mild pain or headache.    Marland Kitchen aspirin EC 81 MG tablet Take 1 tablet (81 mg total) by mouth daily. 90 tablet 3  . Blood Glucose Monitoring Suppl (ACCU-CHEK AVIVA PLUS) w/Device KIT Check BS TID Dx E11.9 1 kit 0  . Cholecalciferol 125 MCG (5000 UT) capsule Take 5,000 Units by mouth. Take one tablet three times weekly     . CINNAMON PO Take by mouth 2 (two) times daily.     . Garlic 503 MG TABS Take by mouth 2 (two) times daily.     Marland Kitchen glucose blood (ACCU-CHEK AVIVA PLUS) test strip Check BS TID Dx E11.9 300 each 3  . insulin detemir (LEVEMIR) 100 UNIT/ML FlexPen Inject 60 Units into the skin daily. 45 mL 3  . ondansetron (ZOFRAN) 4 MG tablet Take 1 tablet (4 mg total) by mouth every 8 (eight) hours as needed for nausea or vomiting. 20 tablet 0  . Semaglutide, 1 MG/DOSE, (OZEMPIC, 1 MG/DOSE,) 4 MG/3ML SOPN Inject 1 mg into the skin once a week. (Patient taking differently: Inject 0.5 mg into the skin once a week.) 9 mL 1   No current facility-administered medications on file prior to visit.    ROS Review of Systems  Constitutional: Negative.   HENT: Negative.   Eyes: Negative for visual disturbance.  Respiratory: Negative for cough and shortness of breath.    Cardiovascular: Negative for chest pain and leg swelling.  Gastrointestinal: Negative for abdominal pain, diarrhea, nausea and vomiting.  Genitourinary: Negative for difficulty urinating.  Musculoskeletal: Negative for arthralgias and myalgias.  Skin: Negative for rash.  Neurological: Negative for headaches.  Psychiatric/Behavioral: Positive for sleep disturbance.    Objective:  BP 113/64   Pulse 74   Temp (!) 97.2 F (36.2 C)   Ht '5\' 10"'  (1.778 m)   Wt 161 lb (73 kg)   SpO2 99%   BMI 23.10 kg/m   BP Readings from Last 3 Encounters:  09/01/20 113/64  07/10/20 134/73  06/26/20 135/77    Wt Readings from Last 3 Encounters:  09/01/20 161 lb (73 kg)  06/20/20 165 lb (74.8 kg)  06/11/20 164 lb (74.4 kg)     Physical Exam Vitals reviewed.  Constitutional:      Appearance: He  is well-developed.  HENT:     Head: Normocephalic and atraumatic.     Right Ear: Tympanic membrane and external ear normal. No decreased hearing noted.     Left Ear: Tympanic membrane and external ear normal. No decreased hearing noted.     Mouth/Throat:     Pharynx: No oropharyngeal exudate or posterior oropharyngeal erythema.  Eyes:     Pupils: Pupils are equal, round, and reactive to light.  Cardiovascular:     Rate and Rhythm: Normal rate and regular rhythm.     Heart sounds: No murmur heard.   Pulmonary:     Effort: No respiratory distress.     Breath sounds: Normal breath sounds.  Abdominal:     General: Bowel sounds are normal.     Palpations: Abdomen is soft. There is no mass.     Tenderness: There is no abdominal tenderness.  Musculoskeletal:     Cervical back: Normal range of motion and neck supple.  Psychiatric:        Mood and Affect: Mood and affect normal.        Speech: Speech normal.        Behavior: Behavior normal.        Thought Content: Thought content normal.        Cognition and Memory: Cognition normal.        Judgment: Judgment normal.     Diabetic Foot Exam  - Simple   Simple Foot Form Diabetic Foot exam was performed with the following findings: Yes 09/01/2020  8:52 AM  Visual Inspection No deformities, no ulcerations, no other skin breakdown bilaterally: Yes Sensation Testing Intact to touch and monofilament testing bilaterally: Yes Pulse Check Posterior Tibialis and Dorsalis pulse intact bilaterally: Yes Comments Onychomycosis notes at right 1&2 toe nails, L first nail       Assessment & Plan:   Bobbye was seen today for medical management of chronic issues.  Diagnoses and all orders for this visit:  Type 2 diabetes mellitus treated with insulin ([place]) -     Bayer DCA Hb A1c Waived -     CBC with Differential/Platelet -     CMP14+EGFR -     irbesartan (AVAPRO) 150 MG tablet; Take 1 tablet (150 mg total) by mouth daily. -     metFORMIN (GLUCOPHAGE) 1000 MG tablet; Take 1 tablet (1,000 mg total) by mouth 2 (two) times daily with a meal.  Mixed hyperlipidemia -     Lipid panel  Hypothyroidism due to acquired atrophy of thyroid -     Thyroid Panel With TSH  Essential hypertension -     irbesartan (AVAPRO) 150 MG tablet; Take 1 tablet (150 mg total) by mouth daily. -     metoprolol tartrate (LOPRESSOR) 50 MG tablet; Take 0.5 tablets (25 mg total) by mouth daily.  Other orders -     amLODipine (NORVASC) 5 MG tablet; Take 1 tablet (5 mg total) by mouth daily. -     atorvastatin (LIPITOR) 80 MG tablet; Take 1 tablet (80 mg total) by mouth daily at 6 PM. -     clopidogrel (PLAVIX) 75 MG tablet; TAKE 1 TABLET EVERY DAY WITH BREAKFAST -     fluticasone (FLONASE) 50 MCG/ACT nasal spray; Place 2 sprays into both nostrils daily as needed for allergies. -     levothyroxine (SYNTHROID) 75 MCG tablet; Take 1 tablet (75 mcg total) by mouth daily.   I have changed Gunnar Fusi. Lockridge's amLODipine, irbesartan, and levothyroxine. I  am also having him maintain his acetaminophen, Cholecalciferol, Garlic, Accu-Chek Aviva Plus, Accu-Chek Aviva Plus,  Accu-Chek FastClix Lancets, CINNAMON PO, aspirin EC, insulin detemir, Ozempic (1 MG/DOSE), ondansetron, atorvastatin, clopidogrel, fluticasone, metFORMIN, and metoprolol tartrate.  Meds ordered this encounter  Medications  . amLODipine (NORVASC) 5 MG tablet    Sig: Take 1 tablet (5 mg total) by mouth daily.    Dispense:  90 tablet    Refill:  1  . atorvastatin (LIPITOR) 80 MG tablet    Sig: Take 1 tablet (80 mg total) by mouth daily at 6 PM.    Dispense:  90 tablet    Refill:  1  . clopidogrel (PLAVIX) 75 MG tablet    Sig: TAKE 1 TABLET EVERY DAY WITH BREAKFAST    Dispense:  90 tablet    Refill:  1  . fluticasone (FLONASE) 50 MCG/ACT nasal spray    Sig: Place 2 sprays into both nostrils daily as needed for allergies.    Dispense:  15.8 mL    Refill:  11  . irbesartan (AVAPRO) 150 MG tablet    Sig: Take 1 tablet (150 mg total) by mouth daily.    Dispense:  90 tablet    Refill:  1  . levothyroxine (SYNTHROID) 75 MCG tablet    Sig: Take 1 tablet (75 mcg total) by mouth daily.    Dispense:  90 tablet    Refill:  1  . metFORMIN (GLUCOPHAGE) 1000 MG tablet    Sig: Take 1 tablet (1,000 mg total) by mouth 2 (two) times daily with a meal.    Dispense:  180 tablet    Refill:  1  . metoprolol tartrate (LOPRESSOR) 50 MG tablet    Sig: Take 0.5 tablets (25 mg total) by mouth daily.    Dispense:  45 tablet    Refill:  1   A1c represents an improvement over previous readings.  Patient continues to work on diet and exercise.  Medications as noted above.  He should reemphasize his efforts in this area for this next cycle.  The A1c today is 8.1.  He knows it needs to be down below 7-1/2 for him by next time.  At that time we will look at increasing his medication regimen if there is not further improvement.  With regard to anxiety and depression, review suggest that these are false positives. Follow-up: Return in about 3 months (around 12/02/2020).  Claretta Fraise, M.D.

## 2020-09-02 LAB — CMP14+EGFR
ALT: 36 IU/L (ref 0–44)
AST: 35 IU/L (ref 0–40)
Albumin/Globulin Ratio: 1.8 (ref 1.2–2.2)
Albumin: 4.2 g/dL (ref 3.7–4.7)
Alkaline Phosphatase: 89 IU/L (ref 44–121)
BUN/Creatinine Ratio: 15 (ref 10–24)
BUN: 14 mg/dL (ref 8–27)
Bilirubin Total: 0.3 mg/dL (ref 0.0–1.2)
CO2: 23 mmol/L (ref 20–29)
Calcium: 9.9 mg/dL (ref 8.6–10.2)
Chloride: 104 mmol/L (ref 96–106)
Creatinine, Ser: 0.91 mg/dL (ref 0.76–1.27)
Globulin, Total: 2.3 g/dL (ref 1.5–4.5)
Glucose: 171 mg/dL — ABNORMAL HIGH (ref 65–99)
Potassium: 4.5 mmol/L (ref 3.5–5.2)
Sodium: 140 mmol/L (ref 134–144)
Total Protein: 6.5 g/dL (ref 6.0–8.5)
eGFR: 86 mL/min/{1.73_m2} (ref >59)

## 2020-09-02 LAB — LIPID PANEL
Chol/HDL Ratio: 1.9 ratio (ref 0.0–5.0)
Cholesterol, Total: 76 mg/dL — ABNORMAL LOW (ref 100–199)
HDL: 39 mg/dL — ABNORMAL LOW (ref >39)
LDL Chol Calc (NIH): 24 mg/dL (ref 0–99)
Triglycerides: 54 mg/dL (ref 0–149)
VLDL Cholesterol Cal: 13 mg/dL (ref 5–40)

## 2020-09-02 LAB — CBC WITH DIFFERENTIAL/PLATELET
Basophils Absolute: 0.1 10*3/uL (ref 0.0–0.2)
Basos: 1 %
EOS (ABSOLUTE): 0.1 10*3/uL (ref 0.0–0.4)
Eos: 2 %
Hematocrit: 43.6 % (ref 37.5–51.0)
Hemoglobin: 14.2 g/dL (ref 13.0–17.7)
Immature Grans (Abs): 0.1 10*3/uL (ref 0.0–0.1)
Immature Granulocytes: 1 %
Lymphocytes Absolute: 1.6 10*3/uL (ref 0.7–3.1)
Lymphs: 20 %
MCH: 27.9 pg (ref 26.6–33.0)
MCHC: 32.6 g/dL (ref 31.5–35.7)
MCV: 86 fL (ref 79–97)
Monocytes Absolute: 0.6 10*3/uL (ref 0.1–0.9)
Monocytes: 8 %
Neutrophils Absolute: 5.5 10*3/uL (ref 1.4–7.0)
Neutrophils: 68 %
Platelets: 233 10*3/uL (ref 150–450)
RBC: 5.09 x10E6/uL (ref 4.14–5.80)
RDW: 13.2 % (ref 11.6–15.4)
WBC: 7.9 10*3/uL (ref 3.4–10.8)

## 2020-09-02 LAB — THYROID PANEL WITH TSH
Free Thyroxine Index: 3 (ref 1.2–4.9)
T3 Uptake Ratio: 31 % (ref 24–39)
T4, Total: 9.7 ug/dL (ref 4.5–12.0)
TSH: 2.25 u[IU]/mL (ref 0.450–4.500)

## 2020-09-02 NOTE — Progress Notes (Signed)
Hello Aaidyn,  Your lab result is normal and/or stable.Some minor variations that are not significant are commonly marked abnormal, but do not represent any medical problem for you.  Best regards, Timon Geissinger, M.D.

## 2020-10-06 ENCOUNTER — Other Ambulatory Visit: Payer: Self-pay

## 2020-10-14 ENCOUNTER — Ambulatory Visit (INDEPENDENT_AMBULATORY_CARE_PROVIDER_SITE_OTHER): Payer: Medicare PPO

## 2020-10-14 VITALS — Ht 70.0 in | Wt 165.0 lb

## 2020-10-14 DIAGNOSIS — Z0000 Encounter for general adult medical examination without abnormal findings: Secondary | ICD-10-CM

## 2020-10-14 NOTE — Patient Instructions (Signed)
[name] [name] , Thank you for taking time to come for your Medicare Wellness Visit. I appreciate your ongoing commitment to your health goals. Please review the following plan we discussed and let me know if I can assist you in the future.   Screening recommendations/referrals: Colonoscopy: Done 11/27/2010 - Repeat not required Recommended yearly ophthalmology/optometry visit for glaucoma screening and checkup Recommended yearly dental visit for hygiene and checkup  Vaccinations: Influenza vaccine: Done 02/13/2020 - Repeat annually Pneumococcal vaccine: Done 04/02/2013 & 02/18/2008 Tdap vaccine: Done 04/28/2017 - Repeat in 10 years Shingles vaccine: Zostavax done 10/28/2010 - Shingrix discussed. Please contact your pharmacy for coverage information.    Covid-19: Done 05/29/19, 06/26/19, & 03/26/2020  Advanced directives: Advance directive discussed with you today. Even though you declined this today, please call our office should you change your mind, and we can give you the proper paperwork for you to fill out.  Conditions/risks identified: Aim for 30 minutes of exercise or brisk walking each day, drink 6-8 glasses of water and eat lots of fruits and vegetables.  Next appointment: Follow up in one year for your annual wellness visit.   Preventive Care 1 Years and Older, Male  Preventive care refers to lifestyle choices and visits with your health care provider that can promote health and wellness. What does preventive care include? A yearly physical exam. This is also called an annual well check. Dental exams once or twice a year. Routine eye exams. Ask your health care provider how often you should have your eyes checked. Personal lifestyle choices, including: Daily care of your teeth and gums. Regular physical activity. Eating a healthy diet. Avoiding tobacco and drug use. Limiting alcohol use. Practicing safe sex. Taking low doses of aspirin every day. Taking vitamin and mineral  supplements as recommended by your health care provider. What happens during an annual well check? The services and screenings done by your health care provider during your annual well check will depend on your age, overall health, lifestyle risk factors, and family history of disease. Counseling  Your health care provider may ask you questions about your: Alcohol use. Tobacco use. Drug use. Emotional well-being. Home and relationship well-being. Sexual activity. Eating habits. History of falls. Memory and ability to understand (cognition). Work and work Statistician. Screening  You may have the following tests or measurements: Height, weight, and BMI. Blood pressure. Lipid and cholesterol levels. These may be checked every 5 years, or more frequently if you are over 53 years old. Skin check. Lung cancer screening. You may have this screening every year starting at age 72 if you have a 30-pack-year history of smoking and currently smoke or have quit within the past 15 years. Fecal occult blood test (FOBT) of the stool. You may have this test every year starting at age 33. Flexible sigmoidoscopy or colonoscopy. You may have a sigmoidoscopy every 5 years or a colonoscopy every 10 years starting at age 75. Prostate cancer screening. Recommendations will vary depending on your family history and other risks. Hepatitis C blood test. Hepatitis B blood test. Sexually transmitted disease (STD) testing. Diabetes screening. This is done by checking your blood sugar (glucose) after you have not eaten for a while (fasting). You may have this done every 1-3 years. Abdominal aortic aneurysm (AAA) screening. You may need this if you are a current or former smoker. Osteoporosis. You may be screened starting at age 31 if you are at high risk. Talk with your health care provider about your test  results, treatment options, and if necessary, the need for more tests. Vaccines  Your health care provider  may recommend certain vaccines, such as: Influenza vaccine. This is recommended every year. Tetanus, diphtheria, and acellular pertussis (Tdap, Td) vaccine. You may need a Td booster every 10 years. Zoster vaccine. You may need this after age 70. Pneumococcal 13-valent conjugate (PCV13) vaccine. One dose is recommended after age 99. Pneumococcal polysaccharide (PPSV23) vaccine. One dose is recommended after age 74. Talk to your health care provider about which screenings and vaccines you need and how often you need them. This information is not intended to replace advice given to you by your health care provider. Make sure you discuss any questions you have with your health care provider. Document Released: 05/02/2015 Document Revised: 12/24/2015 Document Reviewed: 02/04/2015 Elsevier Interactive Patient Education  2017 [place] Prevention in the Home Falls can cause injuries. They can happen to people of all ages. There are many things you can do to make your home safe and to help prevent falls. What can I do on the outside of my home? Regularly fix the edges of walkways and driveways and fix any cracks. Remove anything that might make you trip as you walk through a door, such as a raised step or threshold. Trim any bushes or trees on the path to your home. Use bright outdoor lighting. Clear any walking paths of anything that might make someone trip, such as rocks or tools. Regularly check to see if handrails are loose or broken. Make sure that both sides of any steps have handrails. Any raised decks and porches should have guardrails on the edges. Have any leaves, snow, or ice cleared regularly. Use sand or salt on walking paths during winter. Clean up any spills in your garage right away. This includes oil or grease spills. What can I do in the bathroom? Use night lights. Install grab bars by the toilet and in the tub and shower. Do not use towel bars as grab bars. Use  non-skid mats or decals in the tub or shower. If you need to sit down in the shower, use a plastic, non-slip stool. Keep the floor dry. Clean up any water that spills on the floor as soon as it happens. Remove soap buildup in the tub or shower regularly. Attach bath mats securely with double-sided non-slip rug tape. Do not have throw rugs and other things on the floor that can make you trip. What can I do in the bedroom? Use night lights. Make sure that you have a light by your bed that is easy to reach. Do not use any sheets or blankets that are too big for your bed. They should not hang down onto the floor. Have a firm chair that has side arms. You can use this for support while you get dressed. Do not have throw rugs and other things on the floor that can make you trip. What can I do in the kitchen? Clean up any spills right away. Avoid walking on wet floors. Keep items that you use a lot in easy-to-reach places. If you need to reach something above you, use a strong step stool that has a grab bar. Keep electrical cords out of the way. Do not use floor polish or wax that makes floors slippery. If you must use wax, use non-skid floor wax. Do not have throw rugs and other things on the floor that can make you trip. What can I do with my stairs?  Do not leave any items on the stairs. Make sure that there are handrails on both sides of the stairs and use them. Fix handrails that are broken or loose. Make sure that handrails are as long as the stairways. Check any carpeting to make sure that it is firmly attached to the stairs. Fix any carpet that is loose or worn. Avoid having throw rugs at the top or bottom of the stairs. If you do have throw rugs, attach them to the floor with carpet tape. Make sure that you have a light switch at the top of the stairs and the bottom of the stairs. If you do not have them, ask someone to add them for you. What else can I do to help prevent falls? Wear  shoes that: Do not have high heels. Have rubber bottoms. Are comfortable and fit you well. Are closed at the toe. Do not wear sandals. If you use a stepladder: Make sure that it is fully opened. Do not climb a closed stepladder. Make sure that both sides of the stepladder are locked into place. Ask someone to hold it for you, if possible. Clearly mark and make sure that you can see: Any grab bars or handrails. First and last steps. Where the edge of each step is. Use tools that help you move around (mobility aids) if they are needed. These include: Canes. Walkers. Scooters. Crutches. Turn on the lights when you go into a dark area. Replace any light bulbs as soon as they burn out. Set up your furniture so you have a clear path. Avoid moving your furniture around. If any of your floors are uneven, fix them. If there are any pets around you, be aware of where they are. Review your medicines with your doctor. Some medicines can make you feel dizzy. This can increase your chance of falling. Ask your doctor what other things that you can do to help prevent falls. This information is not intended to replace advice given to you by your health care provider. Make sure you discuss any questions you have with your health care provider. Document Released: 01/30/2009 Document Revised: 09/11/2015 Document Reviewed: 05/10/2014 Elsevier Interactive Patient Education  2017 [employer].

## 2020-10-14 NOTE — Progress Notes (Signed)
Subjective:   [name] [name] is a 80 y.o. male who presents for Medicare Annual/Subsequent preventive examination.  Virtual Visit via Telephone Note  I connected with  [name] [name] on 10/14/20 at  2:00 PM EDT by telephone and verified that I am speaking with the correct person using two identifiers.  Location: Patient: Home Provider: [facility] Persons participating in the virtual visit: patient/Nurse Health Advisor   I discussed the limitations, risks, security and privacy concerns of performing an evaluation and management service by telephone and the availability of in person appointments. The patient expressed understanding and agreed to proceed.  Interactive audio and video telecommunications were attempted between this nurse and patient, however failed, due to patient having technical difficulties OR patient did not have access to video capability.  We continued and completed visit with audio only.  Some vital signs may be absent or patient reported.   Alexsa Flaum E Paden Kuras, LPN   Review of Systems     Cardiac Risk Factors include: advanced age (>5mn, >>62women);diabetes mellitus;male gender;sedentary lifestyle;hypertension     Objective:    Today's Vitals   10/14/20 1304  Weight: 165 lb (74.8 kg)  Height: '5\' 10"'  (1.778 m)   Body mass index is 23.68 kg/m.  Advanced Directives 10/14/2020 06/20/2020 10/04/2019 09/20/2018 08/30/2018 08/26/2018 12/01/2015  Does Patient Have a Medical Advance Directive? No No No Yes No No No  Type of Advance Directive - - -Public librarianLiving will - - -  Does patient want to make changes to medical advance directive? - - - No - Patient declined - - -  Copy of HKittredgein Chart? - - - No - copy requested - - -  Would patient like information on creating a medical advance directive? No - Patient declined No - Patient declined No - Patient declined No - Patient declined No - Patient declined No - Patient declined;No -  Guardian declined No - patient declined information  Pre-existing out of facility DNR order (yellow form or pink MOST form) - - - - - - -    Current Medications (verified) Outpatient Encounter Medications as of 10/14/2020  Medication Sig   Accu-Chek FastClix Lancets MISC Check BS TID Dx E11.9   acetaminophen (TYLENOL) 325 MG tablet Take 2 tablets (650 mg total) by mouth every 4 (four) hours as needed for mild pain or headache.   amLODipine (NORVASC) 5 MG tablet Take 1 tablet (5 mg total) by mouth daily.   aspirin EC 81 MG tablet Take 1 tablet (81 mg total) by mouth daily.   atorvastatin (LIPITOR) 80 MG tablet Take 1 tablet (80 mg total) by mouth daily at 6 PM.   Blood Glucose Monitoring Suppl (ACCU-CHEK AVIVA PLUS) w/Device KIT Check BS TID Dx E11.9   Cholecalciferol 125 MCG (5000 UT) capsule Take 5,000 Units by mouth. Take one tablet three times weekly    CINNAMON PO Take by mouth 2 (two) times daily.    clopidogrel (PLAVIX) 75 MG tablet TAKE 1 TABLET EVERY DAY WITH BREAKFAST   fluticasone (FLONASE) 50 MCG/ACT nasal spray Place 2 sprays into both nostrils daily as needed for allergies.   Garlic 1024MG TABS Take by mouth 2 (two) times daily.    glucose blood (ACCU-CHEK AVIVA PLUS) test strip Check BS TID Dx E11.9   insulin detemir (LEVEMIR) 100 UNIT/ML FlexPen Inject 60 Units into the skin daily.   irbesartan (AVAPRO) 150 MG tablet Take 1 tablet (150 mg total)  by mouth daily.   levothyroxine (SYNTHROID) 75 MCG tablet Take 1 tablet (75 mcg total) by mouth daily.   metFORMIN (GLUCOPHAGE) 1000 MG tablet Take 1 tablet (1,000 mg total) by mouth 2 (two) times daily with a meal.   metoprolol tartrate (LOPRESSOR) 50 MG tablet Take 0.5 tablets (25 mg total) by mouth daily.   ondansetron (ZOFRAN) 4 MG tablet Take 1 tablet (4 mg total) by mouth every 8 (eight) hours as needed for nausea or vomiting.   Semaglutide, 1 MG/DOSE, (OZEMPIC, 1 MG/DOSE,) 4 MG/3ML SOPN Inject 1 mg into the skin once a week.  (Patient taking differently: Inject 0.5 mg into the skin once a week.)   No facility-administered encounter medications on file as of 10/14/2020.    Allergies (verified) Actos [pioglitazone hydrochloride], Lisinopril, Cymbalta [duloxetine hcl], Invokana [canagliflozin], Niaspan [niacin er], and Ramipril   History: Past Medical History:  Diagnosis Date   Basal cell carcinoma    left forehead, nose, neck   CAD (coronary artery disease)    a. s/p DES to Prox-LAD and DES to LCx in 08/2018   Cardiac arrest ([place])    Cataract    Diabetes mellitus without complication ([place])    Diverticulitis    Erectile dysfunction    Gastroesophageal reflux disease with hiatal hernia    Goiter, nontoxic, multinodular    with macrocyst   Hiatal hernia    With reflex   Hyperlipidemia    Inguinal hernia    right side   Kidney stone 1980   Leg fracture    Left   Liver hemangioma    Nephrolithiasis    Vitamin D deficiency    Past Surgical History:  Procedure Laterality Date   ADJACENT TISSUE TRANSFER/TISSUE REARRANGEMENT Left 06/20/2020   Procedure: Left nasal reconstruction with adjacent tissue transfer;  Surgeon: [name], MD;  Location: [facility];  Service: Plastics;  Laterality: Left;  90 min, please   BACK SURGERY  1984   CATARACT EXTRACTION Bilateral    CORONARY STENT INTERVENTION N/A 08/25/2018   Procedure: CORONARY STENT INTERVENTION;  Surgeon: Sherren Mocha, MD;  Location: [place] [facility];  Service: Cardiovascular;  Laterality: N/A;   EYE SURGERY     LACERATION REPAIR  2008   Dr. Lenon Curt -to hand   LEFT HEART CATH AND CORONARY ANGIOGRAPHY N/A 08/25/2018   Procedure: LEFT HEART CATH AND CORONARY ANGIOGRAPHY;  Surgeon: Sherren Mocha, MD;  Location: [place] [facility];  Service: Cardiovascular;  Laterality: N/A;   skin cancer removal     nose   SKIN FULL THICKNESS GRAFT Left 06/20/2020   Procedure: SKIN GRAFT FULL THICKNESS;  Surgeon: [name], MD;  Location: [facility];  Service:  Plastics;  Laterality: Left;   TIBIA FRACTURE SURGERY     left leg   Family History  Problem Relation Age of Onset   Heart attack Sister        Sudden death in her 9s.  No clear as to the cause   Heart disease Brother        Valvular   Heart disease Mother 71       stent, lived to be 21   Cancer Father        ?lung   Early death Brother 3       spinal menigitis   Diabetes Other    Social History   Socioeconomic History   Marital status: Married    Spouse name: Dorian Pod   Number of children: 1  Years of education: Not on file   Highest education level: 11th grade  Occupational History   Occupation: Retired  Tobacco Use   Smoking status: Former    Packs/day: 1.00    Years: 27.00    Pack years: 27.00    Types: Cigarettes    Quit date: 04/20/1983    Years since quitting: 37.5   Smokeless tobacco: Never   Tobacco comments:    Quit 25 years ago  Vaping Use   Vaping Use: Never used  Substance and Sexual Activity   Alcohol use: No   Drug use: No   Sexual activity: Yes  Other Topics Concern   Not on file  Social History Narrative   ** Merged History Encounter **       Married.  Lives with wife.  Grandson stays with them some.     Social Determinants of Health   Financial Resource Strain: Low Risk    Difficulty of Paying Living Expenses: Not hard at all  Food Insecurity: No Food Insecurity   Worried About Charity fundraiser in the Last Year: Never true   [place] in the Last Year: Never true  Transportation Needs: No Transportation Needs   Lack of Transportation (Medical): No   Lack of Transportation (Non-Medical): No  Physical Activity: Inactive   Days of Exercise per Week: 0 days   Minutes of Exercise per Session: 0 min  Stress: No Stress Concern Present   Feeling of Stress : Not at all  Social Connections: Socially Integrated   Frequency of Communication with Friends and Family: More than three times a week   Frequency of Social Gatherings with  Friends and Family: More than three times a week   Attends Religious Services: More than 4 times per year   Active Member of [employer] or Organizations: Yes   Attends Music therapist: More than 4 times per year   Marital Status: Married    Tobacco Counseling Counseling given: Not Answered Tobacco comments: Quit 25 years ago   Clinical Intake:  Pre-visit preparation completed: Yes  Pain : No/denies pain     BMI - recorded: 23.68 Nutritional Status: BMI of 19-24  Normal Nutritional Risks: None Diabetes: Yes CBG done?: No Did pt. bring in CBG monitor from home?: No  How often do you need to have someone help you when you read instructions, pamphlets, or other written materials from your doctor or pharmacy?: 1 - Never  Nutrition Risk Assessment:  Has the patient had any N/V/D within the last 2 months?  No  Does the patient have any non-healing wounds?  No  Has the patient had any unintentional weight loss or weight gain?  No   Diabetes:  Is the patient diabetic?  Yes  If diabetic, was a CBG obtained today?  No  Did the patient bring in their glucometer from home?  No  How often do you monitor your CBG's? Every other day - last reading 120.   Financial Strains and Diabetes Management:  Are you having any financial strains with the device, your supplies or your medication? Yes . He has 3 month supply right now, but was told since he has Part D, he won't be able to get assistance anymore - He wants to speak with Pharmacist about this. Does the patient want to be seen by Chronic Care Management for management of their diabetes?  Yes  Would the patient like to be referred to a Nutritionist or for  Diabetic Management?  No   Diabetic Exams:  Diabetic Eye Exam: Completed 10/25/2018. Overdue for diabetic eye exam. Pt has been advised about the importance in completing this exam. He has appt with [facility] 11/21/20  Diabetic Foot Exam: Completed 09/01/20. Pt has been  advised about the importance in completing this exam. Pt is scheduled for diabetic foot exam on next year.    Interpreter Needed?: No  Information entered by :: Josee Speece, LPN   Activities of Daily Living In your present state of health, do you have any difficulty performing the following activities: 10/14/2020 06/20/2020  Hearing? N N  Vision? N N  Difficulty concentrating or making decisions? Y N  Walking or climbing stairs? N N  Dressing or bathing? N N  Doing errands, shopping? N -  Preparing Food and eating ? N -  Using the Toilet? N -  In the past six months, have you accidently leaked urine? N -  Do you have problems with loss of bowel control? N -  Managing your Medications? N -  Managing your Finances? N -  Housekeeping or managing your Housekeeping? N -  Some recent data might be hidden    Patient Care Team: Claretta Fraise, MD as PCP - General (Family Medicine) Minus Breeding, MD as PCP - Cardiology (Cardiology) Calvert Cantor, MD as Consulting Physician (Ophthalmology) Minus Breeding, MD as Consulting Physician (Cardiology)  Indicate any recent Medical Services you may have received from other than [facility] providers in the past year (date may be approximate).     Assessment:   This is a routine wellness examination for [name] [name].  Hearing/Vision screen Hearing Screening - Comments:: Denies hearing difficulties  Vision Screening - Comments:: Denies vision difficulties - behind on annual eye exam - has appt with Dr Bing Plume 11/21/20  Dietary issues and exercise activities discussed: Current Exercise Habits: The patient does not participate in regular exercise at present, Exercise limited by: cardiac condition(s)   Goals Addressed             This Visit's Progress    Exercise 3x per week (30 min per time)   Not on track    Prevent falls   On track    Pt fell over 10 times last year due to a medication causing dizziness, problem solved now.        Depression  Screen PHQ 2/9 Scores 10/14/2020 09/01/2020 09/01/2020 06/04/2020 03/03/2020 11/28/2019 10/04/2019  PHQ - 2 Score 2 4 0 0 0 0 0  PHQ- 9 Score 7 18 - - - - -    Fall Risk Fall Risk  10/14/2020 09/01/2020 09/01/2020 06/04/2020 03/03/2020  Falls in the past year? 1 1 0 1 1  Number falls in past yr: 0 0 - 0 1  Injury with Fall? 0 1 - 0 1  Risk for fall due to : History of fall(s) History of fall(s) - History of fall(s) History of fall(s)  Follow up Falls prevention discussed Falls evaluation completed - Falls evaluation completed Falls evaluation completed    [place]:  Any stairs in or around the home? Yes  If so, are there any without handrails? No  Home free of loose throw rugs in walkways, pet beds, electrical cords, etc? Yes  Adequate lighting in your home to reduce risk of falls? Yes   ASSISTIVE DEVICES UTILIZED TO PREVENT FALLS:  Life alert? No  Use of a cane, walker or w/c? No  Grab bars in  the bathroom? Yes  Shower chair or bench in shower? No  Elevated toilet seat or a handicapped toilet? No   TIMED UP AND GO:  Was the test performed? No . Telephonic visit.  Cognitive Function: MMSE - Mini Mental State Exam 12/01/2015 11/04/2014  Orientation to time 5 5  Orientation to Place 5 5  Registration 3 3  Attention/ Calculation 5 5  Recall 2 3  Language- name 2 objects 2 2  Language- repeat 1 1  Language- follow 3 step command 3 3  Language- read & follow direction 1 1  Write a sentence 1 1  Copy design 1 1  Total score 29 30     6CIT Screen 10/14/2020 10/04/2019 09/20/2018  What Year? 0 points 0 points 0 points  What month? 0 points 0 points 0 points  What time? 0 points 0 points 0 points  Count back from 20 0 points 2 points 0 points  Months in reverse 0 points 2 points 0 points  Repeat phrase 0 points 0 points 0 points  Total Score 0 4 0    Immunizations Immunization History  Administered Date(s) Administered   Fluad Quad(high Dose  65+) 02/20/2019, 02/13/2020   Influenza Whole 01/17/2010   Influenza, High Dose Seasonal PF 01/31/2015, 02/18/2016, 02/16/2017, 01/25/2018   Influenza,inj,Quad PF,6+ Mos 02/06/2014   Influenza-Unspecified 02/17/2013   Moderna SARS-COV2 Booster Vaccination 03/26/2020   Moderna Sars-Covid-2 Vaccination 05/29/2019, 06/26/2019   Pneumococcal Conjugate-13 04/02/2013   Pneumococcal Polysaccharide-23 02/18/2008   Td 12/19/2006, 02/08/2011, 04/28/2017   Tdap 02/08/2011   Zoster, Live 10/28/2010    TDAP status: Up to date  Flu Vaccine status: Up to date  Pneumococcal vaccine status: Up to date  Covid-19 vaccine status: Completed vaccines  Qualifies for Shingles Vaccine? Yes   Zostavax completed Yes   Shingrix Completed?: No.    Education has been provided regarding the importance of this vaccine. Patient has been advised to call insurance company to determine out of pocket expense if they have not yet received this vaccine. Advised may also receive vaccine at local pharmacy or Health Dept. Verbalized acceptance and understanding.  Screening Tests Health Maintenance  Topic Date Due   Zoster Vaccines- Shingrix (1 of 2) Never done   OPHTHALMOLOGY EXAM  02/24/2019   COVID-19 Vaccine (4 - Booster for Moderna series) 06/24/2020   INFLUENZA VACCINE  11/17/2020   HEMOGLOBIN A1C  03/04/2021   FOOT EXAM  09/01/2021   TETANUS/TDAP  04/29/2027   PNA vac Low Risk Adult  Completed   HPV VACCINES  Aged Out    Health Maintenance  Health Maintenance Due  Topic Date Due   Zoster Vaccines- Shingrix (1 of 2) Never done   OPHTHALMOLOGY EXAM  02/24/2019   COVID-19 Vaccine (4 - Booster for Moderna series) 06/24/2020    Colorectal cancer screening: No longer required.   Lung Cancer Screening: (Low Dose CT Chest recommended if Age 74-80 years, 30 pack-year currently smoking OR have quit w/in 15years.) does not qualify.   Additional Screening:  Hepatitis C Screening: does not qualify; Completed  11/28/2019  Vision Screening: Recommended annual ophthalmology exams for early detection of glaucoma and other disorders of the eye. Is the patient up to date with their annual eye exam?  No  Who is the provider or what is the name of the office in which the patient attends annual eye exams? [facility] If pt is not established with a provider, would they like to be referred to a provider to  establish care? No .   Dental Screening: Recommended annual dental exams for proper oral hygiene  Community Resource Referral / Chronic Care Management: CRR required this visit?  No   CCM required this visit?  No      Plan:     I have personally reviewed and noted the following in the patient's chart:   Medical and social history Use of alcohol, tobacco or illicit drugs  Current medications and supplements including opioid prescriptions. Patient is not currently taking opioid prescriptions. Functional ability and status Nutritional status Physical activity Advanced directives List of other physicians Hospitalizations, surgeries, and ER visits in previous 12 months Vitals Screenings to include cognitive, depression, and falls Referrals and appointments  In addition, I have reviewed and discussed with patient certain preventive protocols, quality metrics, and best practice recommendations. A written personalized care plan for preventive services as well as general preventive health recommendations were provided to patient.     Sandrea Hammond, LPN   10/31/9537   Nurse Notes: None

## 2020-12-02 ENCOUNTER — Ambulatory Visit: Payer: Medicare PPO | Admitting: Family Medicine

## 2020-12-02 ENCOUNTER — Encounter: Payer: Self-pay | Admitting: Family Medicine

## 2020-12-02 ENCOUNTER — Other Ambulatory Visit: Payer: Self-pay

## 2020-12-02 VITALS — BP 110/68 | HR 56 | Temp 97.2°F | Ht 70.0 in | Wt 159.6 lb

## 2020-12-02 DIAGNOSIS — E782 Mixed hyperlipidemia: Secondary | ICD-10-CM | POA: Diagnosis not present

## 2020-12-02 DIAGNOSIS — Z794 Long term (current) use of insulin: Secondary | ICD-10-CM | POA: Diagnosis not present

## 2020-12-02 DIAGNOSIS — R300 Dysuria: Secondary | ICD-10-CM | POA: Diagnosis not present

## 2020-12-02 DIAGNOSIS — I10 Essential (primary) hypertension: Secondary | ICD-10-CM

## 2020-12-02 DIAGNOSIS — E119 Type 2 diabetes mellitus without complications: Secondary | ICD-10-CM

## 2020-12-02 DIAGNOSIS — E034 Atrophy of thyroid (acquired): Secondary | ICD-10-CM

## 2020-12-02 LAB — URINALYSIS
Bilirubin, UA: NEGATIVE
Glucose, UA: NEGATIVE
Nitrite, UA: POSITIVE — AB
Protein,UA: NEGATIVE
Specific Gravity, UA: 1.01 (ref 1.005–1.030)
Urobilinogen, Ur: 0.2 mg/dL (ref 0.2–1.0)
pH, UA: 5.5 (ref 5.0–7.5)

## 2020-12-02 LAB — BAYER DCA HB A1C WAIVED: HB A1C (BAYER DCA - WAIVED): 7.5 % — ABNORMAL HIGH (ref <7.0)

## 2020-12-02 MED ORDER — SULFAMETHOXAZOLE-TRIMETHOPRIM 800-160 MG PO TABS: 1.0000 | ORAL_TABLET | Freq: Two times a day (BID) | ORAL | 0 refills | Status: DC

## 2020-12-02 NOTE — Addendum Note (Signed)
Addended by: Claretta Fraise on: 12/02/2020 09:04 AM   Modules accepted: Orders

## 2020-12-02 NOTE — Progress Notes (Signed)
Subjective:  Patient ID: [name] [name],  male    DOB: 10-06-1940  Age: 80 y.o.    CC: Medical Management of Chronic Issues   HPI [name] [name] presents for  follow-up of  follow-up on  thyroid. The patient has a history of hypothyroidism for many years. It has been stable recently. Pt. denies any change in  voice, loss of hair, heat or cold intolerance. Energy level has been adequate to good. Patient denies constipation and diarrhea. No myxedema. Medication is as noted below. Verified that pt is taking it daily on an empty stomach. Well tolerated.  hypertension. Patient has no history of headache chest pain or shortness of breath or recent cough. Patient also denies symptoms of TIA such as numbness weakness lateralizing. Patient denies side effects from medication. States taking it regularly.  Patient also  in for follow-up of elevated cholesterol. Doing well without complaints on current medication. Denies side effects  including myalgia and arthralgia and nausea. Also in today for liver function testing. Currently no chest pain, shortness of breath or other cardiovascular related symptoms noted.  Follow-up of diabetes. Patient does check blood sugar at home. Readings run between 100-120 fasting and 20-140 Patient denies symptoms such as excessive hunger or urinary frequency, excessive hunger, nausea No significant hypoglycemic spells noted. Medications reviewed. Pt reports taking them regularly. Pt. denies complication/adverse reaction today.    History [name] [name] has a past medical history of Basal cell carcinoma, CAD (coronary artery disease), Cardiac arrest ([place]), Cataract, Diabetes mellitus without complication ([place]), Diverticulitis, Erectile dysfunction, Gastroesophageal reflux disease with hiatal hernia, Goiter, nontoxic, multinodular, Hiatal hernia, Hyperlipidemia, Inguinal hernia, Kidney stone (1980), Leg fracture, Liver hemangioma, Nephrolithiasis, and Vitamin D deficiency.   He  has a past surgical history that includes LEFT HEART CATH AND CORONARY ANGIOGRAPHY (N/A, 08/25/2018); CORONARY STENT INTERVENTION (N/A, 08/25/2018); Laceration repair (2008); Back surgery (1984); Tibia fracture surgery; Cataract extraction (Bilateral); Eye surgery; Adjacent tissue transfer/tissue rearrangement (Left, 06/20/2020); Full thickness skin graft (Left, 06/20/2020); and skin cancer removal.   His family history includes Cancer in his father; Diabetes in an other family member; Early death (age of onset: 80) in his brother; Heart attack in his sister; Heart disease in his brother; Heart disease (age of onset: 59) in his mother.He reports that he quit smoking about 37 years ago. His smoking use included cigarettes. He has a 27.00 pack-year smoking history. He has never used smokeless tobacco. He reports that he does not drink alcohol and does not use drugs.  Current Outpatient Medications on File Prior to Visit  Medication Sig Dispense Refill   Accu-Chek FastClix Lancets MISC Check BS TID Dx E11.9 300 each 3   acetaminophen (TYLENOL) 325 MG tablet Take 2 tablets (650 mg total) by mouth every 4 (four) hours as needed for mild pain or headache.     amLODipine (NORVASC) 5 MG tablet Take 1 tablet (5 mg total) by mouth daily. 90 tablet 1   aspirin EC 81 MG tablet Take 1 tablet (81 mg total) by mouth daily. 90 tablet 3   atorvastatin (LIPITOR) 80 MG tablet Take 1 tablet (80 mg total) by mouth daily at 6 PM. 90 tablet 1   Blood Glucose Monitoring Suppl (ACCU-CHEK AVIVA PLUS) w/Device KIT Check BS TID Dx E11.9 1 kit 0   Cholecalciferol 125 MCG (5000 UT) capsule Take 5,000 Units by mouth. Take one tablet three times weekly      CINNAMON PO Take by mouth 2 (two) times daily.  fluticasone (FLONASE) 50 MCG/ACT nasal spray Place 2 sprays into both nostrils daily as needed for allergies. 56.4 mL 11   Garlic 332 MG TABS Take by mouth 2 (two) times daily.      glucose blood (ACCU-CHEK AVIVA PLUS) test strip Check  BS TID Dx E11.9 300 each 3   insulin detemir (LEVEMIR) 100 UNIT/ML FlexPen Inject 60 Units into the skin daily. 45 mL 3   irbesartan (AVAPRO) 150 MG tablet Take 1 tablet (150 mg total) by mouth daily. 90 tablet 1   levothyroxine (SYNTHROID) 75 MCG tablet Take 1 tablet (75 mcg total) by mouth daily. 90 tablet 1   metFORMIN (GLUCOPHAGE) 1000 MG tablet Take 1 tablet (1,000 mg total) by mouth 2 (two) times daily with a meal. 180 tablet 1   metoprolol tartrate (LOPRESSOR) 50 MG tablet Take 0.5 tablets (25 mg total) by mouth daily. 45 tablet 1   Semaglutide, 1 MG/DOSE, (OZEMPIC, 1 MG/DOSE,) 4 MG/3ML SOPN Inject 1 mg into the skin once a week. (Patient taking differently: Inject 0.5 mg into the skin once a week.) 9 mL 1   No current facility-administered medications on file prior to visit.    ROS Review of Systems  Constitutional:  Negative for fever.  Respiratory:  Negative for shortness of breath.   Cardiovascular:  Negative for chest pain.  Genitourinary:  Positive for dysuria (burning for a week. Hx of frequent infection.) and frequency.  Musculoskeletal:  Negative for arthralgias.  Skin:  Negative for rash.   Objective:  BP 110/68   Pulse (!) 56   Temp (!) 97.2 F (36.2 C)   Ht '5\' 10"'  (1.778 m)   Wt 159 lb 9.6 oz (72.4 kg)   SpO2 97%   BMI 22.90 kg/m   BP Readings from Last 3 Encounters:  12/02/20 110/68  09/01/20 113/64  07/10/20 134/73    Wt Readings from Last 3 Encounters:  12/02/20 159 lb 9.6 oz (72.4 kg)  10/14/20 165 lb (74.8 kg)  09/01/20 161 lb (73 kg)     Physical Exam Vitals reviewed.  Constitutional:      Appearance: He is well-developed.  HENT:     Head: Normocephalic and atraumatic.     Right Ear: External ear normal.     Left Ear: External ear normal.     Mouth/Throat:     Pharynx: No oropharyngeal exudate or posterior oropharyngeal erythema.  Eyes:     Pupils: Pupils are equal, round, and reactive to light.  Cardiovascular:     Rate and Rhythm:  Normal rate and regular rhythm.     Heart sounds: No murmur heard. Pulmonary:     Effort: No respiratory distress.     Breath sounds: Normal breath sounds.  Musculoskeletal:     Cervical back: Normal range of motion and neck supple.  Neurological:     Mental Status: He is alert and oriented to person, place, and time.    Diabetic Foot Exam - Simple   No data filed       Assessment & Plan:   [name] [name] was seen today for medical management of chronic issues.  Diagnoses and all orders for this visit:  Type 2 diabetes mellitus treated with insulin ([place]) -     Bayer DCA Hb A1c Waived -     CBC with Differential/Platelet  Mixed hyperlipidemia -     Lipid panel  Hypothyroidism due to acquired atrophy of thyroid -     TSH + free T4  Essential  hypertension -     CMP14+EGFR  Dysuria -     Urinalysis -     Urine Culture  I have discontinued Maricela Kawahara. Pella's ondansetron and clopidogrel. I am also having him maintain his acetaminophen, Cholecalciferol, Garlic, Accu-Chek Aviva Plus, Accu-Chek Aviva Plus, Accu-Chek FastClix Lancets, CINNAMON PO, aspirin EC, insulin detemir, Ozempic (1 MG/DOSE), amLODipine, atorvastatin, fluticasone, irbesartan, levothyroxine, metFORMIN, and metoprolol tartrate.  No orders of the defined types were placed in this encounter.    Follow-up: Return in about 3 months (around 03/04/2021).  Claretta Fraise, M.D.

## 2020-12-03 LAB — CBC WITH DIFFERENTIAL/PLATELET
Basophils Absolute: 0.1 10*3/uL (ref 0.0–0.2)
Basos: 1 %
EOS (ABSOLUTE): 0.2 10*3/uL (ref 0.0–0.4)
Eos: 2 %
Hematocrit: 43.9 % (ref 37.5–51.0)
Hemoglobin: 13.9 g/dL (ref 13.0–17.7)
Immature Grans (Abs): 0 10*3/uL (ref 0.0–0.1)
Immature Granulocytes: 0 %
Lymphocytes Absolute: 1.6 10*3/uL (ref 0.7–3.1)
Lymphs: 18 %
MCH: 27.2 pg (ref 26.6–33.0)
MCHC: 31.7 g/dL (ref 31.5–35.7)
MCV: 86 fL (ref 79–97)
Monocytes Absolute: 1 10*3/uL — ABNORMAL HIGH (ref 0.1–0.9)
Monocytes: 10 %
Neutrophils Absolute: 6.3 10*3/uL (ref 1.4–7.0)
Neutrophils: 69 %
Platelets: 222 10*3/uL (ref 150–450)
RBC: 5.11 x10E6/uL (ref 4.14–5.80)
RDW: 13.2 % (ref 11.6–15.4)
WBC: 9.1 10*3/uL (ref 3.4–10.8)

## 2020-12-03 LAB — CMP14+EGFR
ALT: 19 IU/L (ref 0–44)
AST: 18 IU/L (ref 0–40)
Albumin/Globulin Ratio: 2 (ref 1.2–2.2)
Albumin: 4.4 g/dL (ref 3.7–4.7)
Alkaline Phosphatase: 73 IU/L (ref 44–121)
BUN/Creatinine Ratio: 18 (ref 10–24)
BUN: 14 mg/dL (ref 8–27)
Bilirubin Total: 0.5 mg/dL (ref 0.0–1.2)
CO2: 17 mmol/L — ABNORMAL LOW (ref 20–29)
Calcium: 10.1 mg/dL (ref 8.6–10.2)
Chloride: 103 mmol/L (ref 96–106)
Creatinine, Ser: 0.76 mg/dL (ref 0.76–1.27)
Globulin, Total: 2.2 g/dL (ref 1.5–4.5)
Glucose: 89 mg/dL (ref 65–99)
Potassium: 5 mmol/L (ref 3.5–5.2)
Sodium: 142 mmol/L (ref 134–144)
Total Protein: 6.6 g/dL (ref 6.0–8.5)
eGFR: 91 mL/min/{1.73_m2} (ref >59)

## 2020-12-03 LAB — TSH+FREE T4
Free T4: 1.65 ng/dL (ref 0.82–1.77)
TSH: 3.27 u[IU]/mL (ref 0.450–4.500)

## 2020-12-03 LAB — LIPID PANEL
Chol/HDL Ratio: 2.5 ratio (ref 0.0–5.0)
Cholesterol, Total: 77 mg/dL — ABNORMAL LOW (ref 100–199)
HDL: 31 mg/dL — ABNORMAL LOW (ref >39)
LDL Chol Calc (NIH): 29 mg/dL (ref 0–99)
Triglycerides: 84 mg/dL (ref 0–149)
VLDL Cholesterol Cal: 17 mg/dL (ref 5–40)

## 2020-12-03 LAB — URINE CULTURE

## 2020-12-05 NOTE — Progress Notes (Signed)
Hello Erubiel,  Your lab result is normal and/or stable.Some minor variations that are not significant are commonly marked abnormal, but do not represent any medical problem for you.  Best regards, Ishaq Maffei, M.D.

## 2020-12-18 DIAGNOSIS — H04123 Dry eye syndrome of bilateral lacrimal glands: Secondary | ICD-10-CM | POA: Diagnosis not present

## 2020-12-18 DIAGNOSIS — H26493 Other secondary cataract, bilateral: Secondary | ICD-10-CM | POA: Diagnosis not present

## 2020-12-18 DIAGNOSIS — E113292 Type 2 diabetes mellitus with mild nonproliferative diabetic retinopathy without macular edema, left eye: Secondary | ICD-10-CM | POA: Diagnosis not present

## 2021-01-06 ENCOUNTER — Ambulatory Visit (INDEPENDENT_AMBULATORY_CARE_PROVIDER_SITE_OTHER): Payer: Medicare PPO | Admitting: Pharmacist

## 2021-01-06 ENCOUNTER — Encounter: Payer: Medicare PPO | Admitting: Pharmacist

## 2021-01-06 ENCOUNTER — Other Ambulatory Visit: Payer: Self-pay

## 2021-01-06 DIAGNOSIS — E119 Type 2 diabetes mellitus without complications: Secondary | ICD-10-CM

## 2021-01-06 DIAGNOSIS — Z794 Long term (current) use of insulin: Secondary | ICD-10-CM

## 2021-01-06 NOTE — Progress Notes (Signed)
  Care Management   Follow Up Note   01/06/2021 Name: [name] MRN: [id number] DOB: 08-Dec-1940    NO SHOW This encounter was created in error - please disregard.

## 2021-01-06 NOTE — Progress Notes (Signed)
Chronic Care Management Pharmacy Note  01/06/2021 Name:  [name] [name] MRN:  [id number] DOB:  10/18/1940  Summary: T2DM  Recommendations/Changes made from today's visit: Diabetes: Uncontrolled, A1C 7.5%, IMPROVING;  Current treatment:OZEMPIC 0.5MG SQ WEEKLY WEDNESDAYS, LEVEMIR 60 UNITS EACH MORNING, METFORMIN 1 G TWICE DAILY WITH MEALS-GFR 91;  NOTED SGTL2 INTOLERANCE TRANSITION LEVEMIR TO TRESBIBA (ONCE PATIENT ASSISTANCE SHIPMENT ARRIVES)--WILL START TRESIBA 55 UNITS INCREASE OZEMPIC TO 1MG SQ WEEKLY Denies personal and family history of Medullary thyroid cancer (MTC) TOLERATING WELL; DENIES SIDE EFFECTS Current glucose readings: fasting glucose: 90-140, post prandial glucose: <190 Denies hypoglycemic/hyperglycemic symptoms Discussed meal planning options and Plate method for healthy eating Avoid sugary drinks and desserts Incorporate balanced protein, non starchy veggies, 1 serving of carbohydrate with each meal Increase water intake Increase physical activity as able Current exercise: N/A Recommended NEW INSULIN, WILL WORK TO TITRATE DOSES Assessed patient finances. APPLICATION SUBMITTED TO [facility] PATIENT ASSISTANCE PROGRAM--OZEMPIC 1MG SQ WEEKLY, TRESIBA 55 UNITS DAILY, PEN NEEDLES; WILL LIKELY BE APPROVED AND SHIP TO PCP OFFICE IN 4 WEEKS  WILL UPDATE MEDICATION LIST ONCE MEDICATIONS ARRIVES AND PATIENT IS ACTUALLY TAKING Follow Up Plan: Telephone follow up appointment with care management team member scheduled for: 02/03/21  Subjective: [name] [name] is an 80 y.o. year old male who is a primary patient of Stacks, Cletus Gash, MD.  The CCM team was consulted for assistance with disease management and care coordination needs.    Engaged with patient face to face for initial visit in response to provider referral for pharmacy case management and/or care coordination services.   Consent to Services:  The patient was given the following information about Chronic Care  Management services today, agreed to services, and gave verbal consent: 1. CCM service includes personalized support from designated clinical staff supervised by the primary care provider, including individualized plan of care and coordination with other care providers 2. 24/7 contact phone numbers for assistance for urgent and routine care needs. 3. Service will only be billed when office clinical staff spend 20 minutes or more in a month to coordinate care. 4. Only one practitioner may furnish and bill the service in a calendar month. 5.The patient may stop CCM services at any time (effective at the end of the month) by phone call to the office staff. 6. The patient will be responsible for cost sharing (co-pay) of up to 20% of the service fee (after annual deductible is met). Patient agreed to services and consent obtained.  Patient Care Team: Claretta Fraise, MD as PCP - General (Family Medicine) Minus Breeding, MD as PCP - Cardiology (Cardiology) Calvert Cantor, MD as Consulting Physician (Ophthalmology) Minus Breeding, MD as Consulting Physician (Cardiology) Lavera Guise, [facility] as Pharmacist (Family Medicine)  Objective:  Lab Results  Component Value Date   CREATININE 0.76 12/02/2020   CREATININE 0.91 09/01/2020   CREATININE 0.77 06/20/2020    Lab Results  Component Value Date   HGBA1C 7.5 (H) 12/02/2020   Last diabetic Eye exam:  Lab Results  Component Value Date/Time   HMDIABEYEEXA Retinopathy (A) 02/23/2018 12:00 AM    Last diabetic Foot exam: No results found for: HMDIABFOOTEX      Component Value Date/Time   CHOL 77 (L) 12/02/2020 0902   CHOL 128 10/16/2012 1545   TRIG 84 12/02/2020 0902   TRIG 52 03/18/2016 1126   TRIG 102 10/16/2012 1545   HDL 31 (L) 12/02/2020 0902   HDL 53 03/18/2016 1126   HDL 45 10/16/2012  1545   CHOLHDL 2.5 12/02/2020 0902   LDLCALC 29 12/02/2020 0902   LDLCALC 66 12/13/2013 0842   LDLCALC 63 10/16/2012 1545    Hepatic Function Latest  Ref Rng & Units 12/02/2020 09/01/2020 06/04/2020  Total Protein 6.0 - 8.5 g/dL 6.6 6.5 6.7  Albumin 3.7 - 4.7 g/dL 4.4 4.2 4.4  AST 0 - 40 IU/L 18 35 26  ALT 0 - 44 IU/L 19 36 33  Alk Phosphatase 44 - 121 IU/L 73 89 95  Total Bilirubin 0.0 - 1.2 mg/dL 0.5 0.3 0.3  Bilirubin, Direct 0.00 - 0.40 mg/dL - - -    Lab Results  Component Value Date/Time   TSH 3.270 12/02/2020 09:02 AM   TSH 2.250 09/01/2020 08:16 AM   FREET4 1.65 12/02/2020 09:02 AM   FREET4 1.15 08/26/2018 10:31 AM    CBC Latest Ref Rng & Units 12/02/2020 09/01/2020 06/20/2020  WBC 3.4 - 10.8 x10E3/uL 9.1 7.9 11.4(H)  Hemoglobin 13.0 - 17.7 g/dL 13.9 14.2 13.3  Hematocrit 37.5 - 51.0 % 43.9 43.6 41.0  Platelets 150 - 450 x10E3/uL 222 233 231    Lab Results  Component Value Date/Time   VD25OH 46.6 10/25/2018 08:03 AM   VD25OH 45.5 06/07/2018 09:35 AM    Clinical ASCVD: Yes  The ASCVD Risk score (Arnett DK, et al., 2019) failed to calculate for the following reasons:   The 2019 ASCVD risk score is only valid for ages 81 to 39   The patient has a prior MI or stroke diagnosis    Other: (CHADS2VASc if Afib, PHQ9 if depression, MMRC or CAT for COPD, ACT, DEXA)  Social History   Tobacco Use  Smoking Status Former   Packs/day: 1.00   Years: 27.00   Pack years: 27.00   Types: Cigarettes   Quit date: 04/20/1983   Years since quitting: 37.7  Smokeless Tobacco Never  Tobacco Comments   Quit 25 years ago   BP Readings from Last 3 Encounters:  12/02/20 110/68  09/01/20 113/64  07/10/20 134/73   Pulse Readings from Last 3 Encounters:  12/02/20 (!) 56  09/01/20 74  07/10/20 71   Wt Readings from Last 3 Encounters:  12/02/20 159 lb 9.6 oz (72.4 kg)  10/14/20 165 lb (74.8 kg)  09/01/20 161 lb (73 kg)    Assessment: Review of patient past medical history, allergies, medications, health status, including review of consultants reports, laboratory and other test data, was performed as part of comprehensive evaluation  and provision of chronic care management services.   SDOH:  (Social Determinants of Health) assessments and interventions performed:    CCM Care Plan  Allergies  Allergen Reactions   Actos [Pioglitazone Hydrochloride] Swelling    Fluid retention   Lisinopril Rash   Cymbalta [Duloxetine Hcl] Other (See Comments)    Weakness. Legs likerubber, can't walk   Invokana [Canagliflozin] Rash   Niaspan [Niacin Er] Other (See Comments)    Increased blood sugar   Ramipril Diarrhea    Medications Reviewed Today     Reviewed by Lavera Guise, [facility] (Pharmacist) on 01/06/21 at 1403  Med List Status: <None>   Medication Order Taking? Sig Documenting Provider Last Dose Status Informant  Accu-Chek FastClix Lancets MISC [id number] No Check BS TID Dx E11.9 Claretta Fraise, MD Taking Active Self  acetaminophen (TYLENOL) 325 MG tablet [id number] No Take 2 tablets (650 mg total) by mouth every 4 (four) hours as needed for mild pain or headache. Cathlyn Parsons, PA-C Taking Active  Self  amLODipine (NORVASC) 5 MG tablet [id number] No Take 1 tablet (5 mg total) by mouth daily. Claretta Fraise, MD Taking Active   aspirin EC 81 MG tablet [id number] No Take 1 tablet (81 mg total) by mouth daily. Minus Breeding, MD Taking Active Self  atorvastatin (LIPITOR) 80 MG tablet [id number] No Take 1 tablet (80 mg total) by mouth daily at 6 PM. Claretta Fraise, MD Taking Active   Blood Glucose Monitoring Suppl (ACCU-CHEK AVIVA PLUS) w/Device KIT [id number] No Check BS TID Dx E11.9 Claretta Fraise, MD Taking Active Self  Cholecalciferol 125 MCG (5000 UT) capsule [id number] No Take 5,000 Units by mouth. Take one tablet three times weekly  [provider] Taking Active Self  CINNAMON PO [id number] No Take by mouth 2 (two) times daily.  [provider] Taking Active Self  fluticasone (FLONASE) 50 MCG/ACT nasal spray [id number] No Place 2 sprays into both nostrils daily as needed for allergies. Claretta Fraise, MD  Taking Active   Garlic 937 MG TABS [id number] No Take by mouth 2 (two) times daily.  [provider] Taking Active Self  glucose blood (ACCU-CHEK AVIVA PLUS) test strip [id number] No Check BS TID Dx E11.9 Claretta Fraise, MD Taking Active Self  insulin detemir (LEVEMIR) 100 UNIT/ML FlexPen [id number] No Inject 60 Units into the skin daily. Claretta Fraise, MD Taking Active Self  irbesartan (AVAPRO) 150 MG tablet [id number] No Take 1 tablet (150 mg total) by mouth daily. Claretta Fraise, MD Taking Active   levothyroxine (SYNTHROID) 75 MCG tablet [id number] No Take 1 tablet (75 mcg total) by mouth daily. Claretta Fraise, MD Taking Active   metFORMIN (GLUCOPHAGE) 1000 MG tablet [id number] No Take 1 tablet (1,000 mg total) by mouth 2 (two) times daily with a meal. Claretta Fraise, MD Taking Active   metoprolol tartrate (LOPRESSOR) 50 MG tablet [id number] No Take 0.5 tablets (25 mg total) by mouth daily. Claretta Fraise, MD Taking Active   Semaglutide, 1 MG/DOSE, (OZEMPIC, 1 MG/DOSE,) 4 MG/3ML SOPN [id number] No Inject 1 mg into the skin once a week.  Patient taking differently: Inject 0.5 mg into the skin once a week.   Claretta Fraise, MD Taking Active            Med Note Blanca Friend, Royce Macadamia   Tue Jan 06, 2021  2:03 PM) VIA [facility] PATIENT ASSISTANCE PROGRAM  sulfamethoxazole-trimethoprim (BACTRIM DS) 800-160 MG tablet [id number]  Take 1 tablet by mouth 2 (two) times daily. Until gone, for infection Claretta Fraise, MD  Active             Patient Active Problem List   Diagnosis Date Noted   Ventricular fibrillation ([place]) 08/21/2019   Educated about COVID-19 virus infection 08/21/2019   History of cardiac arrest 12/13/2018   Coronary artery disease involving native coronary artery of native heart without angina pectoris 12/13/2018   PAF (paroxysmal atrial fibrillation) ([place]) 12/13/2018   Debility 08/30/2018   CAD S/P percutaneous coronary angioplasty 08/30/2018   Atrial fibrillation, chronic  ([place]) 08/30/2018   T2DM (type 2 diabetes mellitus) ([place]) 47/12/6281   Acute diastolic congestive heart failure ([place])    Aspiration pneumonia of both lungs (H[facility])    Atrial fibrillation with rapid ventricular response ([place])    Diabetes mellitus type 2 in nonobese ([place])    Dyslipidemia    Diabetic retinopathy ([place]) 05/22/2017   Peripheral vascular insufficiency ([place]) 03/18/2016   Family history of heart disease 03/18/2016   Right inguinal hernia 12/13/2013   Essential hypertension 12/13/2013  BPH (benign prostatic hyperplasia) 12/13/2013   Hypothyroidism 08/07/2013   Vitamin D deficiency 04/02/2013   Goiter, nontoxic, multinodular    Kidney stone    Hyperlipidemia 01/15/2009   ABNORMAL ELECTROCARDIOGRAM 01/15/2009   Type 2 diabetes mellitus treated with insulin ([place]) 01/09/2009   Diaphragmatic hernia 01/09/2009   BASAL CELL CARCINOMA, HX OF 01/09/2009   DIVERTICULITIS, HX OF 01/09/2009    Immunization History  Administered Date(s) Administered   Fluad Quad(high Dose 65+) 02/20/2019, 02/13/2020   Influenza Whole 01/17/2010   Influenza, High Dose Seasonal PF 01/31/2015, 02/18/2016, 02/16/2017, 01/25/2018   Influenza,inj,Quad PF,6+ Mos 02/06/2014   Influenza-Unspecified 02/17/2013   Moderna SARS-COV2 Booster Vaccination 03/26/2020   Moderna Sars-Covid-2 Vaccination 05/29/2019, 06/26/2019   Pneumococcal Conjugate-13 04/02/2013   Pneumococcal Polysaccharide-23 02/18/2008   Td 12/19/2006, 02/08/2011, 04/28/2017   Tdap 02/08/2011   Zoster, Live 10/28/2010    Conditions to be addressed/monitored: DMII  Care Plan : PHARMD MEDICATION MANAGEMENT  Updates made by Lavera Guise, [place] since 01/06/2021 12:00 AM     Problem: DISEASE PROGRESSION PREVENTION      Long-Range Goal: T2DM   This Visit's Progress: Not on track  Priority: High  Note:   Current Barriers:  Unable to independently afford treatment regimen Unable to achieve control of T2DM  Suboptimal therapeutic regimen  for T2DM  Pharmacist Clinical Goal(s):  Over the next 180 days, patient will verbalize ability to afford treatment regimen achieve control of T2DM as evidenced by IMPROVED GLYCEMIC CONTROL; GOAL A1C<7%  through collaboration with PharmD and provider.    Interventions: 1:1 collaboration with Claretta Fraise, MD regarding development and update of comprehensive plan of care as evidenced by provider attestation and co-signature Inter-disciplinary care team collaboration (see longitudinal plan of care) Comprehensive medication review performed; medication list updated in electronic medical record  Diabetes: Uncontrolled, A1C 7.5%, IMPROVING;  Current treatment:OZEMPIC 0.5MG SQ WEEKLY WEDNESDAYS, LEVEMIR 60 UNITS EACH MORNING, METFORMIN 1 G TWICE DAILY WITH MEALS-GFR 91;  NOTED SGTL2 INTOLERANCE TRANSITION LEVEMIR TO TRESBIBA (ONCE PATIENT ASSISTANCE SHIPMENT ARRIVES)--WILL START TRESIBA [place] Denies personal and family history of Medullary thyroid cancer (MTC) TOLERATING WELL; DENIES SIDE EFFECTS Current glucose readings: fasting glucose: 90-140, post prandial glucose: <190 Denies hypoglycemic/hyperglycemic symptoms Discussed meal planning options and Plate method for healthy eating Avoid sugary drinks and desserts Incorporate balanced protein, non starchy veggies, 1 serving of carbohydrate with each meal Increase water intake Increase physical activity as able Current exercise: N/A Recommended NEW INSULIN, WILL WORK TO TITRATE DOSES Assessed patient finances. APPLICATION SUBMITTED TO [facility] PATIENT ASSISTANCE PROGRAM--OZEMPIC 1MG SQ WEEKLY, TRESIBA 55 UNITS DAILY, PEN NEEDLES; WILL LIKELY BE APPROVED AND SHIP TO PCP OFFICE IN 4 WEEKS   WILL UPDATE MEDICATION LIST ONCE MEDICATIONS ARRIVES AND PATIENT IS ACTUALLY TAKING   Patient Goals/Self-Care Activities Over the next 180 days, patient will:  - take medications as prescribed check glucose  DAILY (FASTING) OR IF SYMPTOMATIC, document, and provide at future appointments collaborate with provider on medication access solutions  Follow Up Plan: Telephone follow up appointment with care management team member scheduled for: 02/03/21      Medication Assistance: Application for [facility]-OZEMPIC/TRESIBA-PEN NEEDLES  medication assistance program. in process.  Anticipated assistance start date TBD.  See plan of care for additional detail.  Patient's preferred pharmacy is:  [facility] [address] [place] - [place] [address] [place] [address] Phone: [phone] Fax: [phone]  [facility] (  Now [facility]) - [place], [place] [address] Phone: [phone] Fax: [phone]  Follow Up:  Patient agrees to Care Plan and Follow-up.  Plan: Telephone follow up appointment with care management team member scheduled for:  02/03/21  Regina Eck, PharmD, BCPS Clinical Pharmacist, [place]  II Phone [phone]

## 2021-01-06 NOTE — Patient Instructions (Signed)
Visit Information  PATIENT GOALS:  Goals Addressed               This Visit's Progress     Patient Stated     T2DM PHARMD GOAL (pt-stated)        Current Barriers:  Unable to independently afford treatment regimen Unable to achieve control of T2DM  Suboptimal therapeutic regimen for T2DM  Pharmacist Clinical Goal(s):  Over the next 180 days, patient will verbalize ability to afford treatment regimen achieve control of T2DM as evidenced by IMPROVED GLYCEMIC CONTROL; GOAL A1C<7% through collaboration with PharmD and provider.    Interventions: 1:1 collaboration with Claretta Fraise, MD regarding development and update of comprehensive plan of care as evidenced by provider attestation and co-signature Inter-disciplinary care team collaboration (see longitudinal plan of care) Comprehensive medication review performed; medication list updated in electronic medical record  Diabetes: Uncontrolled, A1C 7.5%, IMPROVING;  Current treatment:OZEMPIC 0.5MG  SQ WEEKLY WEDNESDAYS, LEVEMIR 60 UNITS EACH MORNING, METFORMIN 1 G TWICE DAILY WITH MEALS-GFR 91;  NOTED SGTL2 INTOLERANCE TRANSITION LEVEMIR TO TRESBIBA (ONCE PATIENT ASSISTANCE SHIPMENT ARRIVES)--WILL START TRESIBA 55 UNITS INCREASE OZEMPIC TO 1MG  SQ WEEKLY Denies personal and family history of Medullary thyroid cancer (MTC) TOLERATING WELL; DENIES SIDE EFFECTS Current glucose readings: fasting glucose: 90-140, post prandial glucose: <190 Denies hypoglycemic/hyperglycemic symptoms Discussed meal planning options and Plate method for healthy eating Avoid sugary drinks and desserts Incorporate balanced protein, non starchy veggies, 1 serving of carbohydrate with each meal Increase water intake Increase physical activity as able Current exercise: N/A Recommended NEW INSULIN, WILL WORK TO TITRATE DOSES Assessed patient finances. APPLICATION SUBMITTED TO [facility] PATIENT ASSISTANCE PROGRAM--OZEMPIC 1MG  SQ WEEKLY, TRESIBA 55 UNITS  DAILY, PEN NEEDLES; WILL LIKELY BE APPROVED AND SHIP TO PCP OFFICE IN 4 WEEKS  WILL UPDATE MEDICATION LIST ONCE MEDICATIONS ARRIVES AND PATIENT IS ACTUALLY TAKING   Patient Goals/Self-Care Activities Over the next 180 days, patient will:  - take medications as prescribed check glucose DAILY (FASTING) OR IF SYMPTOMATIC, document, and provide at future appointments collaborate with provider on medication access solutions  Follow Up Plan: Telephone follow up appointment with care management team member scheduled for: 02/03/21         The patient verbalized understanding of instructions, educational materials, and care plan provided today and declined offer to receive copy of patient instructions, educational materials, and care plan.   Telephone follow up appointment with care management team member scheduled for: 02/03/21  Signature Regina Eck, PharmD, BCPS Clinical Pharmacist, [place]  [facility] Phone [phone]

## 2021-01-15 DIAGNOSIS — H26491 Other secondary cataract, right eye: Secondary | ICD-10-CM | POA: Diagnosis not present

## 2021-01-16 DIAGNOSIS — Z794 Long term (current) use of insulin: Secondary | ICD-10-CM | POA: Diagnosis not present

## 2021-01-16 DIAGNOSIS — E119 Type 2 diabetes mellitus without complications: Secondary | ICD-10-CM | POA: Diagnosis not present

## 2021-01-29 IMAGING — CT CT HEAD WITHOUT CONTRAST
5 of 8 series · 17 of 47 positions shown, 18 images · non-contrast
Comparison: None.

CLINICAL DATA: Head injury, post CPR, unresponsive

EXAM:
CT HEAD WITHOUT CONTRAST
CT CERVICAL SPINE WITHOUT CONTRAST
TECHNIQUE: Multidetector CT imaging of the head and cervical spine was
performed following the standard protocol without intravenous
contrast. Multiplanar CT image reconstructions of the cervical spine
were also generated.

[Series 4: head wo · axial · 0.46mm/px · z∈[-130,+45]mm · 3 of 36 slices shown, 4 images]
[im 1/36  brain]
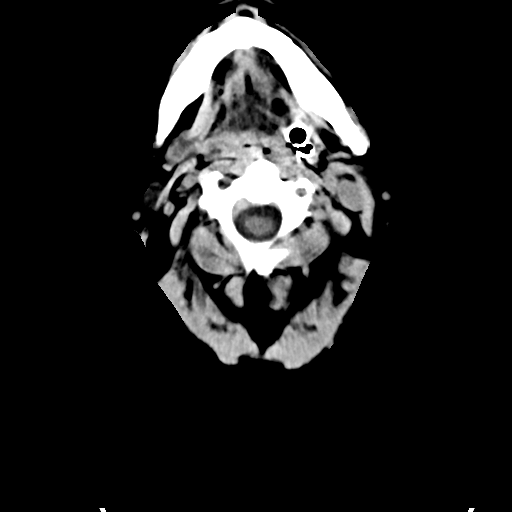
[im 1/36  bone]
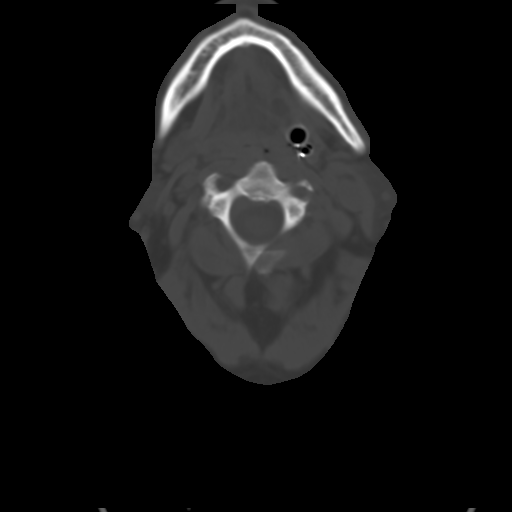
[im 18/36  brain]
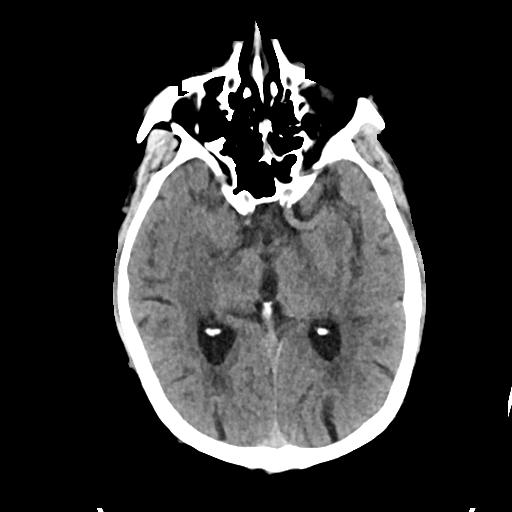
[im 36/36  brain]
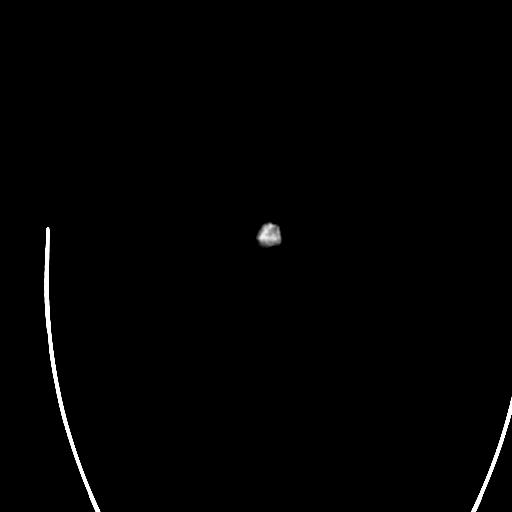

[Series 5: head bone · axial · 0.46mm/px · z∈[-106,+22]mm · 6 of 90 slices shown]
[im 13/90  bone]
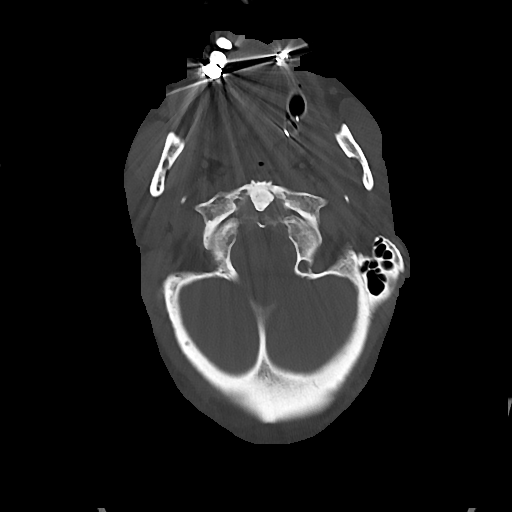
[im 26/90  bone]
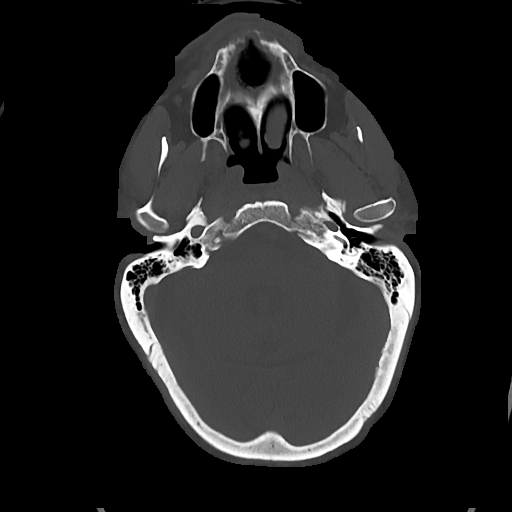
[im 39/90  bone]
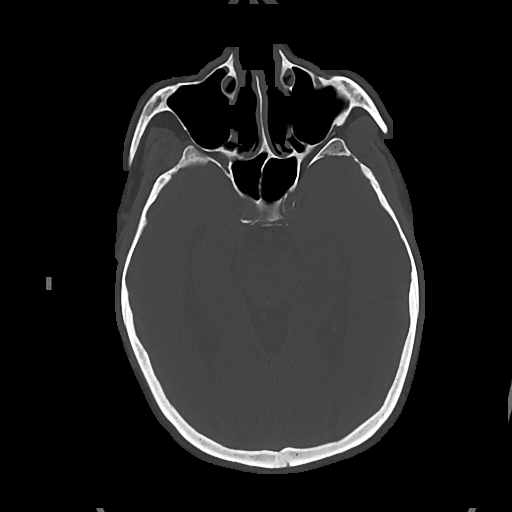
[im 51/90  bone]
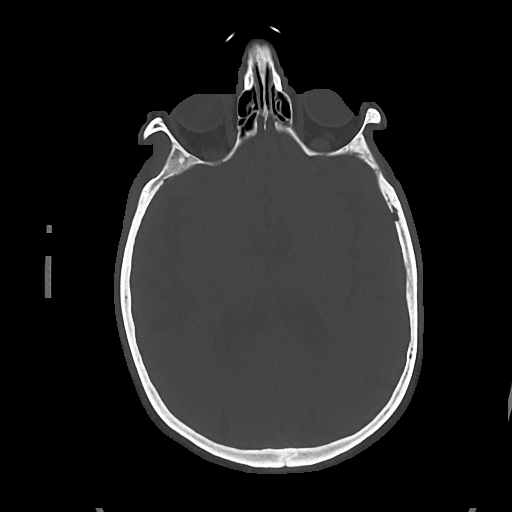
[im 64/90  bone]
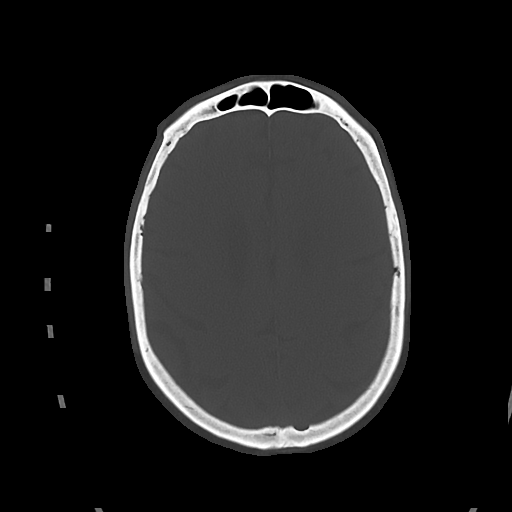
[im 77/90  bone]
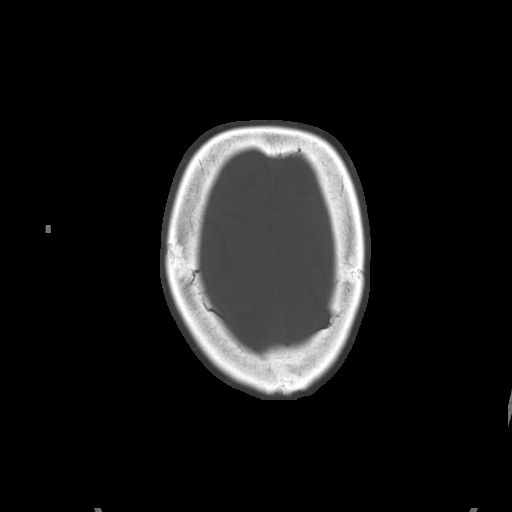

[Series 6: cor soft · coronal · 0.37mm/px · 3 of 78 slices shown]
[im 3/78  brain]
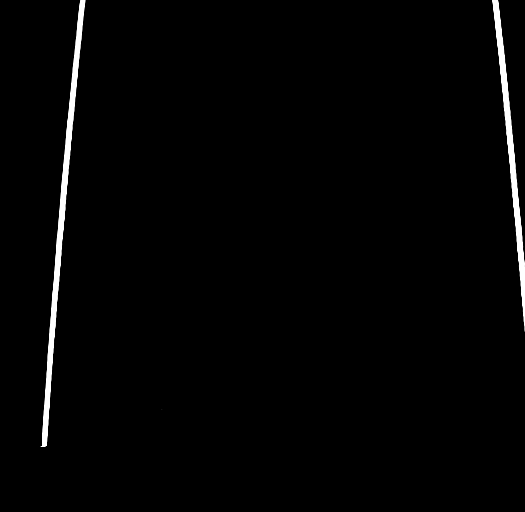
[im 5/78  brain]
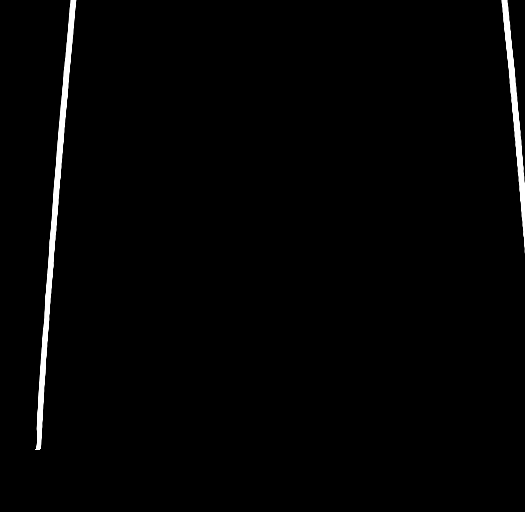
[im 8/78  brain]
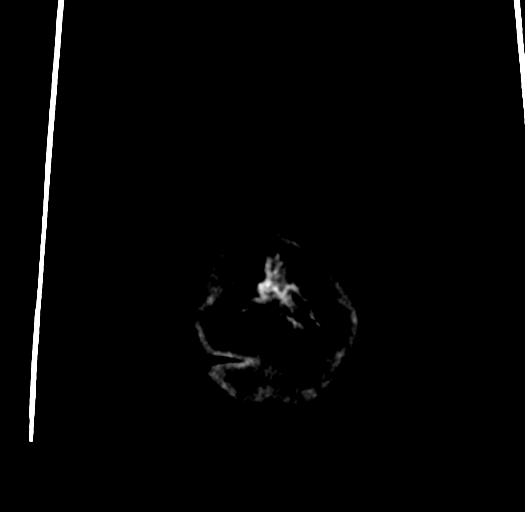

[Series 7: sag soft · sagittal · 0.36mm/px · 2 of 67 slices shown]
[im 23/67  brain]
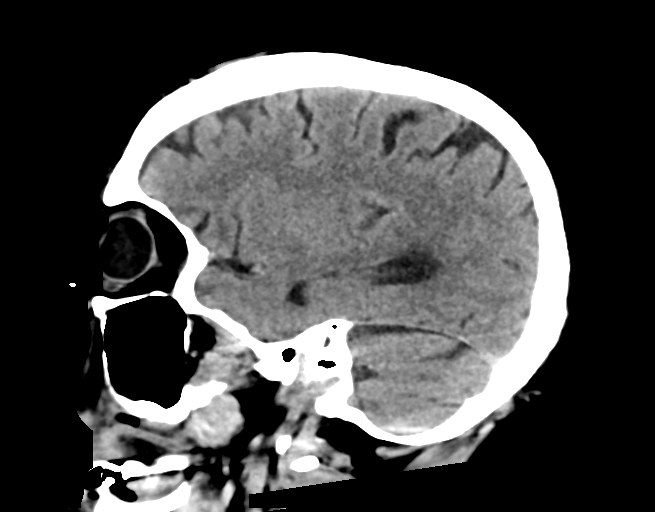
[im 45/67  brain]
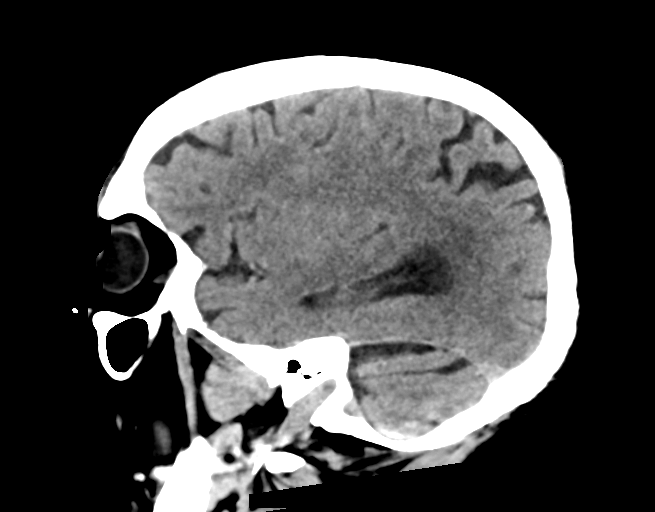

[Series 9: c spine soft · axial · 0.36mm/px · z∈[-245,-189]mm · 3 of 112 slices shown]
[im 14/112  brain]
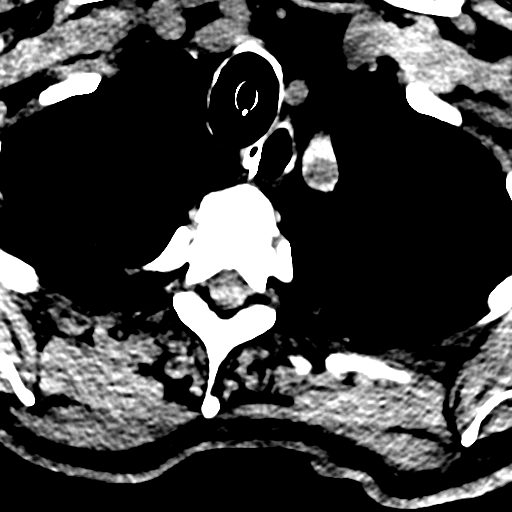
[im 28/112  brain]
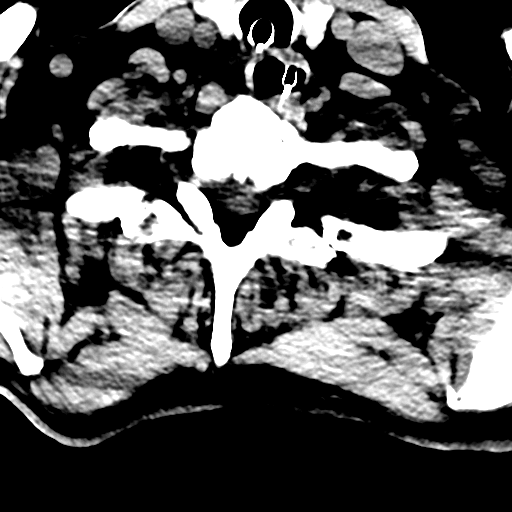
[im 42/112  brain]
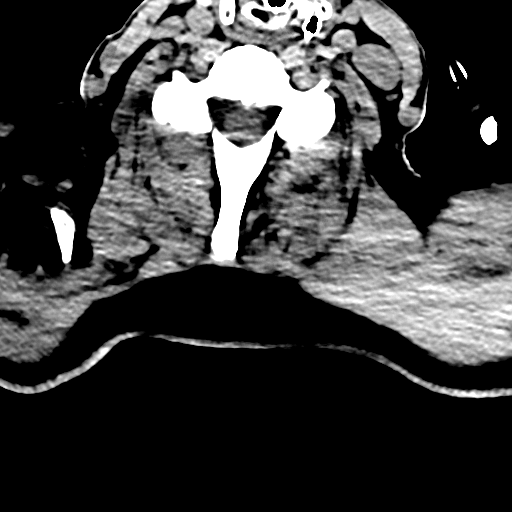

[17 of 47 positions shown; findings below may reference images not displayed]

FINDINGS: CT HEAD FINDINGS

Brain: No evidence of acute infarction, hemorrhage, hydrocephalus,
extra-axial collection or mass lesion/mass effect. Periventricular
white matter hypodensity.

Vascular: No hyperdense vessel or unexpected calcification.

Skull: Normal. Negative for fracture or focal lesion.

Sinuses/Orbits: No acute finding.

Other: None.

CT CERVICAL SPINE FINDINGS

Alignment: Normal.

Skull base and vertebrae: No acute fracture. No primary bone lesion
or focal pathologic process.

Soft tissues and spinal canal: No prevertebral fluid or swelling. No
visible canal hematoma.

Disc levels: Generally mild multilevel disc degenerative disease and
osteophytosis.

Upper chest: Negative.

Other: Partially imaged endotracheal and orogastric tubes.
IMPRESSION: 1. No acute intracranial pathology. Small-vessel white matter
disease.

2.  No fracture or static subluxation of the cervical spine.

## 2021-01-31 IMAGING — DX CHEST  1 VIEW
1 series · 1 of 1 positions shown · non-contrast
Comparison: 08/18/2018

CLINICAL DATA: Hypoxemia

EXAM:
CHEST  1 VIEW

[chest]
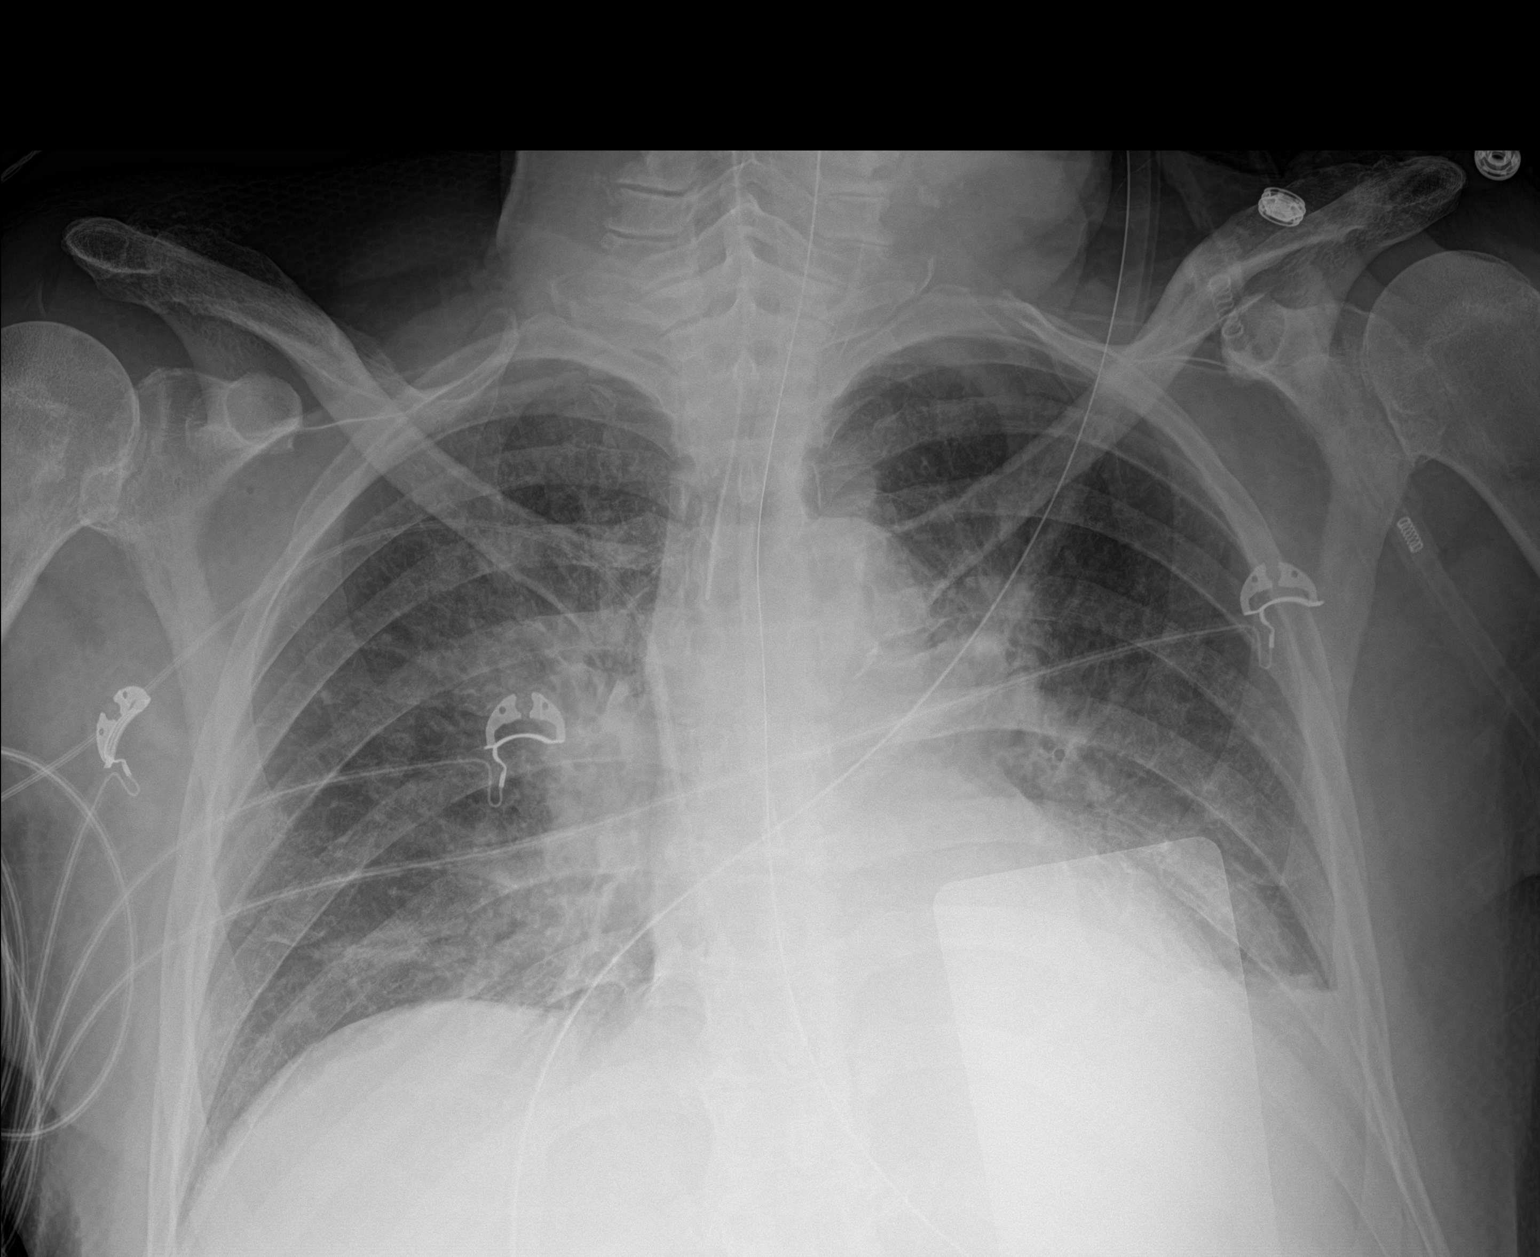

[1 of 1 positions shown; findings below may reference images not displayed]

FINDINGS: Perihilar and lower lobe airspace opacities, likely edema. Low lung
volumes. Small left pleural effusion. Heart is borderline in size.
Endotracheal tube and NG tube are unchanged.
IMPRESSION: Perihilar and lower lobe opacities, likely edema.

Small left effusion.

## 2021-02-03 ENCOUNTER — Telehealth: Payer: Medicare PPO

## 2021-02-26 ENCOUNTER — Ambulatory Visit (INDEPENDENT_AMBULATORY_CARE_PROVIDER_SITE_OTHER): Payer: Medicare PPO | Admitting: Pharmacist

## 2021-02-26 DIAGNOSIS — E782 Mixed hyperlipidemia: Secondary | ICD-10-CM

## 2021-02-26 DIAGNOSIS — Z794 Long term (current) use of insulin: Secondary | ICD-10-CM

## 2021-02-26 DIAGNOSIS — E119 Type 2 diabetes mellitus without complications: Secondary | ICD-10-CM

## 2021-03-03 ENCOUNTER — Telehealth: Payer: Self-pay | Admitting: Family Medicine

## 2021-03-04 NOTE — Telephone Encounter (Signed)
Schedule for patient assistance

## 2021-03-05 ENCOUNTER — Ambulatory Visit: Payer: Medicare PPO | Admitting: Family Medicine

## 2021-03-05 ENCOUNTER — Encounter: Payer: Self-pay | Admitting: Family Medicine

## 2021-03-05 ENCOUNTER — Telehealth: Payer: Self-pay

## 2021-03-05 ENCOUNTER — Other Ambulatory Visit: Payer: Self-pay

## 2021-03-05 ENCOUNTER — Telehealth: Payer: Self-pay | Admitting: Family Medicine

## 2021-03-05 VITALS — BP 126/60 | HR 61 | Temp 96.9°F | Resp 20 | Ht 70.0 in | Wt 164.0 lb

## 2021-03-05 DIAGNOSIS — I10 Essential (primary) hypertension: Secondary | ICD-10-CM

## 2021-03-05 DIAGNOSIS — E119 Type 2 diabetes mellitus without complications: Secondary | ICD-10-CM

## 2021-03-05 DIAGNOSIS — E782 Mixed hyperlipidemia: Secondary | ICD-10-CM

## 2021-03-05 DIAGNOSIS — Z23 Encounter for immunization: Secondary | ICD-10-CM

## 2021-03-05 DIAGNOSIS — E034 Atrophy of thyroid (acquired): Secondary | ICD-10-CM

## 2021-03-05 DIAGNOSIS — Z794 Long term (current) use of insulin: Secondary | ICD-10-CM

## 2021-03-05 LAB — BAYER DCA HB A1C WAIVED: HB A1C (BAYER DCA - WAIVED): 7.8 % — ABNORMAL HIGH (ref 4.8–5.6)

## 2021-03-05 MED ORDER — OZEMPIC (1 MG/DOSE) 4 MG/3ML ~~LOC~~ SOPN: 1.0000 mg | PEN_INJECTOR | SUBCUTANEOUS | 1 refills | Status: AC

## 2021-03-05 MED ORDER — ATORVASTATIN CALCIUM 80 MG PO TABS: 80.0000 mg | ORAL_TABLET | Freq: Every day | ORAL | 1 refills | Status: DC

## 2021-03-05 MED ORDER — METOPROLOL TARTRATE 50 MG PO TABS
25.0000 mg | ORAL_TABLET | Freq: Every day | ORAL | 1 refills | Status: DC
Start: 2021-03-05 — End: 2021-08-03

## 2021-03-05 MED ORDER — IRBESARTAN 150 MG PO TABS: 150.0000 mg | ORAL_TABLET | Freq: Every day | ORAL | 1 refills | Status: DC

## 2021-03-05 MED ORDER — AMLODIPINE BESYLATE 5 MG PO TABS: 5.0000 mg | ORAL_TABLET | Freq: Every day | ORAL | 1 refills | Status: DC

## 2021-03-05 MED ORDER — METFORMIN HCL 1000 MG PO TABS
1000.0000 mg | ORAL_TABLET | Freq: Two times a day (BID) | ORAL | 1 refills | Status: DC
Start: 2021-03-05 — End: 2021-08-03

## 2021-03-05 MED ORDER — LEVOTHYROXINE SODIUM 75 MCG PO TABS: 75.0000 ug | ORAL_TABLET | Freq: Every day | ORAL | 1 refills | Status: DC

## 2021-03-05 NOTE — Telephone Encounter (Signed)
Patient has an upcoming appointment for patient assistance but he is going to be out of his Ozempic and Levemir before the appointment and would like to know if you can help.

## 2021-03-05 NOTE — Progress Notes (Signed)
Subjective:  Patient ID: [name] [name],  male    DOB: [birth year]  Age: 80 y.o.    CC: Medical Management of Chronic Issues   HPI [name] [name] presents for  follow-up of hypertension. Patient has no history of headache chest pain or shortness of breath or recent cough. Patient also denies symptoms of TIA such as numbness weakness lateralizing. Patient denies side effects from medication. States taking it regularly.  Patient also  in for follow-up of elevated cholesterol. Doing well without complaints on current medication. Denies side effects  including myalgia and arthralgia and nausea. Also in today for liver function testing. Currently no chest pain, shortness of breath or other cardiovascular related symptoms noted.  Follow-up of diabetes. Patient does check blood sugar at home. Readings run between 130-140. Patient denies symptoms such as excessive hunger or urinary frequency, excessive hunger, nausea No significant hypoglycemic spells noted. Medications reviewed. Pt reports taking them regularly. Pt. denies complication/adverse reaction today.    History [name] [name] has a past medical history of Basal cell carcinoma, CAD (coronary artery disease), Cardiac arrest ([place]), Cataract, Diabetes mellitus without complication ([place]), Diverticulitis, Erectile dysfunction, Gastroesophageal reflux disease with hiatal hernia, Goiter, nontoxic, multinodular, Hiatal hernia, Hyperlipidemia, Inguinal hernia, Kidney stone (1980), Leg fracture, Liver hemangioma, Nephrolithiasis, and Vitamin D deficiency.   He has a past surgical history that includes LEFT HEART CATH AND CORONARY ANGIOGRAPHY (N/A, 08/25/2018); CORONARY STENT INTERVENTION (N/A, 08/25/2018); Laceration repair (2008); Back surgery (1984); Tibia fracture surgery; Cataract extraction (Bilateral); Eye surgery; Adjacent tissue transfer/tissue rearrangement (Left, 06/20/2020); Full thickness skin graft (Left, 06/20/2020); and skin cancer removal.   His  family history includes Cancer in his father; Diabetes in an other family member; Early death (age of onset: 46) in his brother; Heart attack in his sister; Heart disease in his brother; Heart disease (age of onset: 68) in his mother.He reports that he quit smoking about 37 years ago. His smoking use included cigarettes. He has a 27.00 pack-year smoking history. He has never used smokeless tobacco. He reports that he does not drink alcohol and does not use drugs.  Current Outpatient Medications on File Prior to Visit  Medication Sig Dispense Refill   Accu-Chek FastClix Lancets MISC Check BS TID Dx E11.9 300 each 3   acetaminophen (TYLENOL) 325 MG tablet Take 2 tablets (650 mg total) by mouth every 4 (four) hours as needed for mild pain or headache.     aspirin EC 81 MG tablet Take 1 tablet (81 mg total) by mouth daily. 90 tablet 3   Blood Glucose Monitoring Suppl (ACCU-CHEK AVIVA PLUS) w/Device KIT Check BS TID Dx E11.9 1 kit 0   Cholecalciferol 125 MCG (5000 UT) capsule Take 5,000 Units by mouth. Take one tablet three times weekly      CINNAMON PO Take by mouth 2 (two) times daily.      fluticasone (FLONASE) 50 MCG/ACT nasal spray Place 2 sprays into both nostrils daily as needed for allergies. 62.9 mL 11   Garlic 476 MG TABS Take by mouth 2 (two) times daily.      glucose blood (ACCU-CHEK AVIVA PLUS) test strip Check BS TID Dx E11.9 300 each 3   insulin detemir (LEVEMIR) 100 UNIT/ML FlexPen Inject 60 Units into the skin daily. 45 mL 3   No current facility-administered medications on file prior to visit.    ROS Review of Systems  Constitutional: Negative.   HENT: Negative.    Eyes:  Negative for visual disturbance.  Respiratory:  Negative for cough and shortness of breath.   Cardiovascular:  Negative for chest pain and leg swelling.  Gastrointestinal:  Negative for abdominal pain, diarrhea, nausea and vomiting.  Genitourinary:  Negative for difficulty urinating.  Musculoskeletal:   Negative for arthralgias and myalgias.  Skin:  Negative for rash.  Neurological:  Negative for headaches.  Psychiatric/Behavioral:  Negative for sleep disturbance.    Objective:  BP 126/60   Pulse 61   Temp (!) 96.9 F (36.1 C) (Temporal)   Resp 20   Ht _0  (1.778 m)   Wt 164 lb (74.4 kg)   SpO2 98%   BMI 23.53 kg/m   BP Readings from Last 3 Encounters:  03/05/21 126/60  12/02/20 110/68  09/01/20 113/64    Wt Readings from Last 3 Encounters:  03/05/21 164 lb (74.4 kg)  12/02/20 159 lb 9.6 oz (72.4 kg)  10/14/20 165 lb (74.8 kg)     Physical Exam Constitutional:      General: He is not in acute distress.    Appearance: He is well-developed.  HENT:     Head: Normocephalic and atraumatic.     Right Ear: External ear normal.     Left Ear: External ear normal.     Nose: Nose normal.  Eyes:     Conjunctiva/sclera: Conjunctivae normal.     Pupils: Pupils are equal, round, and reactive to light.  Cardiovascular:     Rate and Rhythm: Normal rate and regular rhythm.     Heart sounds: Normal heart sounds. No murmur heard. Pulmonary:     Effort: Pulmonary effort is normal. No respiratory distress.     Breath sounds: Normal breath sounds. No wheezing or rales.  Abdominal:     Palpations: Abdomen is soft.     Tenderness: There is no abdominal tenderness.  Musculoskeletal:        General: Normal range of motion.     Cervical back: Normal range of motion and neck supple.  Skin:    General: Skin is warm and dry.  Neurological:     Mental Status: He is alert and oriented to person, place, and time.     Deep Tendon Reflexes: Reflexes are normal and symmetric.  Psychiatric:        Behavior: Behavior normal.        Thought Content: Thought content normal.        Judgment: Judgment normal.    Diabetic Foot Exam - Simple   No data filed       Assessment & Plan:   [name] [name] was seen today for medical management of chronic issues.  Diagnoses and all orders for this  visit:  Type 2 diabetes mellitus treated with insulin ([place]) -     Bayer DCA Hb A1c Waived -     CBC with Differential/Platelet -     CMP14+EGFR -     Lipid panel -     metFORMIN (GLUCOPHAGE) 1000 MG tablet; Take 1 tablet (1,000 mg total) by mouth 2 (two) times daily with a meal. -     irbesartan (AVAPRO) 150 MG tablet; Take 1 tablet (150 mg total) by mouth daily.  Mixed hyperlipidemia -     CBC with Differential/Platelet -     CMP14+EGFR -     Lipid panel  Hypothyroidism due to acquired atrophy of thyroid -     CBC with Differential/Platelet -     CMP14+EGFR -     Lipid panel -  Thyroid Panel With TSH  Essential hypertension -     CBC with Differential/Platelet -     CMP14+EGFR -     Lipid panel -     metoprolol tartrate (LOPRESSOR) 50 MG tablet; Take 0.5 tablets (25 mg total) by mouth daily. -     irbesartan (AVAPRO) 150 MG tablet; Take 1 tablet (150 mg total) by mouth daily.  Other orders -     levothyroxine (SYNTHROID) 75 MCG tablet; Take 1 tablet (75 mcg total) by mouth daily. -     atorvastatin (LIPITOR) 80 MG tablet; Take 1 tablet (80 mg total) by mouth daily at 6 PM. -     amLODipine (NORVASC) 5 MG tablet; Take 1 tablet (5 mg total) by mouth daily. -     Semaglutide, 1 MG/DOSE, (OZEMPIC, 1 MG/DOSE,) 4 MG/3ML SOPN; Inject 1 mg into the skin once a week.  I have discontinued Bhargav Barbaro. [place]'s sulfamethoxazole-trimethoprim. I am also having him maintain his acetaminophen, Cholecalciferol, Garlic, Accu-Chek Aviva Plus, Accu-Chek Aviva Plus, Accu-Chek FastClix Lancets, CINNAMON PO, aspirin EC, insulin detemir, fluticasone, metoprolol tartrate, metFORMIN, levothyroxine, irbesartan, atorvastatin, amLODipine, and Ozempic (1 MG/DOSE).  Meds ordered this encounter  Medications   metoprolol tartrate (LOPRESSOR) 50 MG tablet    Sig: Take 0.5 tablets (25 mg total) by mouth daily.    Dispense:  45 tablet    Refill:  1   metFORMIN (GLUCOPHAGE) 1000 MG tablet    Sig: Take 1  tablet (1,000 mg total) by mouth 2 (two) times daily with a meal.    Dispense:  180 tablet    Refill:  1   levothyroxine (SYNTHROID) 75 MCG tablet    Sig: Take 1 tablet (75 mcg total) by mouth daily.    Dispense:  90 tablet    Refill:  1   irbesartan (AVAPRO) 150 MG tablet    Sig: Take 1 tablet (150 mg total) by mouth daily.    Dispense:  90 tablet    Refill:  1   atorvastatin (LIPITOR) 80 MG tablet    Sig: Take 1 tablet (80 mg total) by mouth daily at 6 PM.    Dispense:  90 tablet    Refill:  1   amLODipine (NORVASC) 5 MG tablet    Sig: Take 1 tablet (5 mg total) by mouth daily.    Dispense:  90 tablet    Refill:  1   Semaglutide, 1 MG/DOSE, (OZEMPIC, 1 MG/DOSE,) 4 MG/3ML SOPN    Sig: Inject 1 mg into the skin once a week.    Dispense:  9 mL    Refill:  1     Follow-up: Return in about 3 months (around 06/05/2021) for diabetes.  Claretta Fraise, M.D.

## 2021-03-05 NOTE — Telephone Encounter (Signed)
Jena Gauss,   I received this message to schedule for med assistance. I was going to schedule with Cyril Mourning due to your schedule being so full, but it looks like someone at your office has already scheduled for 03/19/2021 for 36min.   Is this ok or do I need to reschedule?  Thank you, Noreene Larsson, [place], [place], [place] [address] Direct Dial: [phone] [email or web address]

## 2021-03-06 LAB — CBC WITH DIFFERENTIAL/PLATELET
Basophils Absolute: 0.1 10*3/uL (ref 0.0–0.2)
Basos: 1 %
EOS (ABSOLUTE): 0.3 10*3/uL (ref 0.0–0.4)
Eos: 3 %
Hematocrit: 42.8 % (ref 37.5–51.0)
Hemoglobin: 14.1 g/dL (ref 13.0–17.7)
Immature Grans (Abs): 0.1 10*3/uL (ref 0.0–0.1)
Immature Granulocytes: 1 %
Lymphocytes Absolute: 2 10*3/uL (ref 0.7–3.1)
Lymphs: 24 %
MCH: 28 pg (ref 26.6–33.0)
MCHC: 32.9 g/dL (ref 31.5–35.7)
MCV: 85 fL (ref 79–97)
Monocytes Absolute: 0.7 10*3/uL (ref 0.1–0.9)
Monocytes: 8 %
Neutrophils Absolute: 5.6 10*3/uL (ref 1.4–7.0)
Neutrophils: 63 %
Platelets: 225 10*3/uL (ref 150–450)
RBC: 5.04 x10E6/uL (ref 4.14–5.80)
RDW: 13 % (ref 11.6–15.4)
WBC: 8.7 10*3/uL (ref 3.4–10.8)

## 2021-03-06 LAB — CMP14+EGFR
ALT: 25 IU/L (ref 0–44)
AST: 30 IU/L (ref 0–40)
Albumin/Globulin Ratio: 1.9 (ref 1.2–2.2)
Albumin: 4.4 g/dL (ref 3.7–4.7)
Alkaline Phosphatase: 76 IU/L (ref 44–121)
BUN/Creatinine Ratio: 13 (ref 10–24)
BUN: 12 mg/dL (ref 8–27)
Bilirubin Total: 0.3 mg/dL (ref 0.0–1.2)
CO2: 23 mmol/L (ref 20–29)
Calcium: 10.2 mg/dL (ref 8.6–10.2)
Chloride: 102 mmol/L (ref 96–106)
Creatinine, Ser: 0.9 mg/dL (ref 0.76–1.27)
Globulin, Total: 2.3 g/dL (ref 1.5–4.5)
Glucose: 130 mg/dL — ABNORMAL HIGH (ref 70–99)
Potassium: 4.6 mmol/L (ref 3.5–5.2)
Sodium: 139 mmol/L (ref 134–144)
Total Protein: 6.7 g/dL (ref 6.0–8.5)
eGFR: 86 mL/min/{1.73_m2} (ref >59)

## 2021-03-06 LAB — LIPID PANEL
Chol/HDL Ratio: 2.1 ratio (ref 0.0–5.0)
Cholesterol, Total: 78 mg/dL — ABNORMAL LOW (ref 100–199)
HDL: 37 mg/dL — ABNORMAL LOW (ref >39)
LDL Chol Calc (NIH): 25 mg/dL (ref 0–99)
Triglycerides: 71 mg/dL (ref 0–149)
VLDL Cholesterol Cal: 16 mg/dL (ref 5–40)

## 2021-03-06 LAB — THYROID PANEL WITH TSH
Free Thyroxine Index: 2.4 (ref 1.2–4.9)
T3 Uptake Ratio: 27 % (ref 24–39)
T4, Total: 9 ug/dL (ref 4.5–12.0)
TSH: 2.76 u[IU]/mL (ref 0.450–4.500)

## 2021-03-06 NOTE — Telephone Encounter (Signed)
Samples for patient left in fridge Awaiting financials for patient assistance

## 2021-03-09 NOTE — Progress Notes (Signed)
Hello Dorian,  Your lab result is normal and/or stable.Some minor variations that are not significant are commonly marked abnormal, but do not represent any medical problem for you.  Best regards, Yvonnie Schinke, M.D.

## 2021-03-10 NOTE — Progress Notes (Signed)
Chronic Care Management Pharmacy Note  02/26/2021 Name:  [name] [name] MRN:  [id number] DOB:  1940-09-20  Summary: T2DM  Recommendations/Changes made from today's visit: Current Barriers:  Unable to independently afford treatment regimen Unable to achieve control of T2DM  Suboptimal therapeutic regimen for T2DM  Pharmacist Clinical Goal(s):  Over the next 180 days, patient will verbalize ability to afford treatment regimen achieve control of T2DM as evidenced by IMPROVED GLYCEMIC CONTROL; GOAL A1C<7% through collaboration with PharmD and provider.    Interventions: 1:1 collaboration with Claretta Fraise, MD regarding development and update of comprehensive plan of care as evidenced by provider attestation and co-signature Inter-disciplinary care team collaboration (see longitudinal plan of care) Comprehensive medication review performed; medication list updated in electronic medical record  Diabetes: Uncontrolled, A1C 7.5%, IMPROVING;  Current treatment:OZEMPIC 1MG SQ WEEKLY WEDNESDAYS, LEVEMIR 60 UNITS EACH MORNING, METFORMIN 1 G TWICE DAILY WITH MEALS-GFR 91;  NOTED SGTL2 INTOLERANCE TRANSITION LEVEMIR TO TRESBIBA (ONCE PATIENT ASSISTANCE SHIPMENT ARRIVES)--WILL START TRESIBA 26 UNITS CONTINUE OZEMPIC TO 1MG SQ WEEKLY Denies personal and family history of Medullary thyroid cancer (MTC) TOLERATING WELL; DENIES SIDE EFFECTS AWAITING SHIPMENT FROM [facility] P[facility] Current glucose readings: fasting glucose: 90-140, post prandial glucose: <190 Denies hypoglycemic/hyperglycemic symptoms Discussed meal planning options and Plate method for healthy eating Avoid sugary drinks and desserts Incorporate balanced protein, non starchy veggies, 1 serving of carbohydrate with each meal Increase water intake Increase physical activity as able Current exercise: N/A Recommended NEW INSULIN, WILL WORK TO TITRATE DOSES Assessed patient finances. APPLICATION SUBMITTED TO [facility]  PATIENT ASSISTANCE PROGRAM--OZEMPIC 1MG SQ WEEKLY, TRESIBA 55 UNITS DAILY, PEN NEEDLES; WILL LIKELY BE APPROVED AND SHIP TO PCP OFFICE IN 4 WEEKS  WILL UPDATE MEDICATION LIST ONCE MEDICATIONS ARRIVES AND PATIENT IS ACTUALLY TAKING DISCUSSED HYPERLIPIDEMIA/COMPLIANCE WITH MEDICATIONS   Patient Goals/Self-Care Activities Over the next 180 days, patient will:  - take medications as prescribed check glucose DAILY (FASTING) OR IF SYMPTOMATIC, document, and provide at future appointments collaborate with provider on medication access solutions  Follow Up Plan: Telephone follow up appointment with care management team member scheduled for: 03/2021  Subjective: [name] [name] is an 80 y.o. year old male who is a primary patient of Stacks, Cletus Gash, MD.  The CCM team was consulted for assistance with disease management and care coordination needs.    Engaged with patient by telephone for follow up visit in response to provider referral for pharmacy case management and/or care coordination services.   Consent to Services:  The patient was given information about Chronic Care Management services, agreed to services, and gave verbal consent prior to initiation of services.  Please see initial visit note for detailed documentation.   Patient Care Team: Claretta Fraise, MD as PCP - General (Family Medicine) Minus Breeding, MD as PCP - Cardiology (Cardiology) Calvert Cantor, MD as Consulting Physician (Ophthalmology) Minus Breeding, MD as Consulting Physician (Cardiology) Lavera Guise, [facility] as Pharmacist (Family Medicine)  Objective:  Lab Results  Component Value Date   CREATININE 0.90 03/05/2021   CREATININE 0.76 12/02/2020   CREATININE 0.91 09/01/2020    Lab Results  Component Value Date   HGBA1C 7.8 (H) 03/05/2021   Last diabetic Eye exam:  Lab Results  Component Value Date/Time   HMDIABEYEEXA Retinopathy (A) 02/23/2018 12:00 AM    Last diabetic Foot exam: No results found for:  HMDIABFOOTEX      Component Value Date/Time   CHOL 78 (L) 03/05/2021 0800   CHOL 128  10/16/2012 1545   TRIG 71 03/05/2021 0800   TRIG 52 03/18/2016 1126   TRIG 102 10/16/2012 1545   HDL 37 (L) 03/05/2021 0800   HDL 53 03/18/2016 1126   HDL 45 10/16/2012 1545   CHOLHDL 2.1 03/05/2021 0800   LDLCALC 25 03/05/2021 0800   LDLCALC 66 12/13/2013 0842   LDLCALC 63 10/16/2012 1545    Hepatic Function Latest Ref Rng & Units 03/05/2021 12/02/2020 09/01/2020  Total Protein 6.0 - 8.5 g/dL 6.7 6.6 6.5  Albumin 3.7 - 4.7 g/dL 4.4 4.4 4.2  AST 0 - 40 IU/L 30 18 35  ALT 0 - 44 IU/L 25 19 36  Alk Phosphatase 44 - 121 IU/L 76 73 89  Total Bilirubin 0.0 - 1.2 mg/dL 0.3 0.5 0.3  Bilirubin, Direct 0.00 - 0.40 mg/dL - - -    Lab Results  Component Value Date/Time   TSH 2.760 03/05/2021 08:00 AM   TSH 3.270 12/02/2020 09:02 AM   FREET4 1.65 12/02/2020 09:02 AM   FREET4 1.15 08/26/2018 10:31 AM    CBC Latest Ref Rng & Units 03/05/2021 12/02/2020 09/01/2020  WBC 3.4 - 10.8 x10E3/uL 8.7 9.1 7.9  Hemoglobin 13.0 - 17.7 g/dL 14.1 13.9 14.2  Hematocrit 37.5 - 51.0 % 42.8 43.9 43.6  Platelets 150 - 450 x10E3/uL 225 222 233    Lab Results  Component Value Date/Time   VD25OH 46.6 10/25/2018 08:03 AM   VD25OH 45.5 06/07/2018 09:35 AM    Clinical ASCVD: Yes  The ASCVD Risk score (Arnett DK, et al., 2019) failed to calculate for the following reasons:   The 2019 ASCVD risk score is only valid for ages 31 to 29   The patient has a prior MI or stroke diagnosis    Other: (CHADS2VASc if Afib, PHQ9 if depression, MMRC or CAT for COPD, ACT, DEXA)  Social History   Tobacco Use  Smoking Status Former   Packs/day: 1.00   Years: 27.00   Pack years: 27.00   Types: Cigarettes   Quit date: 04/20/1983   Years since quitting: 37.9  Smokeless Tobacco Never  Tobacco Comments   Quit 25 years ago   BP Readings from Last 3 Encounters:  03/05/21 126/60  12/02/20 110/68  09/01/20 113/64   Pulse Readings  from Last 3 Encounters:  03/05/21 61  12/02/20 (!) 56  09/01/20 74   Wt Readings from Last 3 Encounters:  03/05/21 164 lb (74.4 kg)  12/02/20 159 lb 9.6 oz (72.4 kg)  10/14/20 165 lb (74.8 kg)    Assessment: Review of patient past medical history, allergies, medications, health status, including review of consultants reports, laboratory and other test data, was performed as part of comprehensive evaluation and provision of chronic care management services.   SDOH:  (Social Determinants of Health) assessments and interventions performed:    CCM Care Plan  Allergies  Allergen Reactions   Actos [Pioglitazone Hydrochloride] Swelling    Fluid retention   Lisinopril Rash   Cymbalta [Duloxetine Hcl] Other (See Comments)    Weakness. Legs likerubber, can't walk   Invokana [Canagliflozin] Rash   Niaspan [Niacin Er] Other (See Comments)    Increased blood sugar   Ramipril Diarrhea    Medications Reviewed Today     Reviewed by Lavera Guise, [facility] (Pharmacist) on 03/06/21 at 1021  Med List Status: <None>   Medication Order Taking? Sig Documenting Provider Last Dose Status Informant  Accu-Chek FastClix Lancets MISC [id number] No Check BS TID Dx E11.9 Claretta Fraise,  MD Taking Active Self  acetaminophen (TYLENOL) 325 MG tablet [id number] No Take 2 tablets (650 mg total) by mouth every 4 (four) hours as needed for mild pain or headache. Cathlyn Parsons, PA-C Taking Active Self  amLODipine (NORVASC) 5 MG tablet [id number]  Take 1 tablet (5 mg total) by mouth daily. Claretta Fraise, MD  Active   aspirin EC 81 MG tablet [id number] No Take 1 tablet (81 mg total) by mouth daily. Minus Breeding, MD Taking Active Self  atorvastatin (LIPITOR) 80 MG tablet [id number]  Take 1 tablet (80 mg total) by mouth daily at 6 PM. Claretta Fraise, MD  Active   Blood Glucose Monitoring Suppl (ACCU-CHEK AVIVA PLUS) w/Device KIT [id number] No Check BS TID Dx E11.9 Claretta Fraise, MD Taking Active Self   Cholecalciferol 125 MCG (5000 UT) capsule [id number] No Take 5,000 Units by mouth. Take one tablet three times weekly  [provider] Taking Active Self  CINNAMON PO [id number] No Take by mouth 2 (two) times daily.  [provider] Taking Active Self  fluticasone (FLONASE) 50 MCG/ACT nasal spray [id number] No Place 2 sprays into both nostrils daily as needed for allergies. Claretta Fraise, MD Taking Active   Garlic 675 MG TABS [id number] No Take by mouth 2 (two) times daily.  [provider] Taking Active Self  glucose blood (ACCU-CHEK AVIVA PLUS) test strip [id number] No Check BS TID Dx E11.9 Claretta Fraise, MD Taking Active Self  insulin detemir (LEVEMIR) 100 UNIT/ML FlexPen [id number] No Inject 60 Units into the skin daily. Claretta Fraise, MD Taking Active Self  irbesartan (AVAPRO) 150 MG tablet [id number]  Take 1 tablet (150 mg total) by mouth daily. Claretta Fraise, MD  Active   levothyroxine (SYNTHROID) 75 MCG tablet [id number]  Take 1 tablet (75 mcg total) by mouth daily. Claretta Fraise, MD  Active   metFORMIN (GLUCOPHAGE) 1000 MG tablet [id number]  Take 1 tablet (1,000 mg total) by mouth 2 (two) times daily with a meal. Claretta Fraise, MD  Active   metoprolol tartrate (LOPRESSOR) 50 MG tablet [id number]  Take 0.5 tablets (25 mg total) by mouth daily. Claretta Fraise, MD  Active   Semaglutide, 1 MG/DOSE, (OZEMPIC, 1 MG/DOSE,) 4 MG/3ML SOPN [id number]  Inject 1 mg into the skin once a week. Claretta Fraise, MD  Active             Patient Active Problem List   Diagnosis Date Noted   Ventricular fibrillation ([place]) 08/21/2019   Educated about COVID-19 virus infection 08/21/2019   History of cardiac arrest 12/13/2018   Coronary artery disease involving native coronary artery of native heart without angina pectoris 12/13/2018   PAF (paroxysmal atrial fibrillation) ([place]) 12/13/2018   Debility 08/30/2018   CAD S/P percutaneous coronary angioplasty 08/30/2018   Atrial  fibrillation, chronic ([place]) 08/30/2018   T2DM (type 2 diabetes mellitus) ([place]) 03/83/3383   Acute diastolic congestive heart failure ([place])    Aspiration pneumonia of both lungs (H[facility])    Atrial fibrillation with rapid ventricular response ([place])    Diabetes mellitus type 2 in nonobese ([place])    Dyslipidemia    Diabetic retinopathy ([place]) 05/22/2017   Peripheral vascular insufficiency ([place]) 03/18/2016   Family history of heart disease 03/18/2016   Right inguinal hernia 12/13/2013   Essential hypertension 12/13/2013   BPH (benign prostatic hyperplasia) 12/13/2013   Hypothyroidism 08/07/2013   Vitamin D deficiency 04/02/2013   Goiter, nontoxic, multinodular    Kidney stone    Hyperlipidemia 01/15/2009   ABNORMAL ELECTROCARDIOGRAM  01/15/2009   Type 2 diabetes mellitus treated with insulin ([place]) 01/09/2009   Diaphragmatic hernia 01/09/2009   BASAL CELL CARCINOMA, HX OF 01/09/2009   DIVERTICULITIS, HX OF 01/09/2009    Immunization History  Administered Date(s) Administered   Fluad Quad(high Dose 65+) 02/20/2019, 02/13/2020, 03/05/2021   Influenza Whole 01/17/2010   Influenza, High Dose Seasonal PF 01/31/2015, 02/18/2016, 02/16/2017, 01/25/2018   Influenza,inj,Quad PF,6+ Mos 02/06/2014   Influenza-Unspecified 02/17/2013   Moderna SARS-COV2 Booster Vaccination 03/26/2020   Moderna Sars-Covid-2 Vaccination 05/29/2019, 06/26/2019   Pneumococcal Conjugate-13 04/02/2013   Pneumococcal Polysaccharide-23 02/18/2008   Td 12/19/2006, 02/08/2011, 04/28/2017   Tdap 02/08/2011   Zoster, Live 10/28/2010    Conditions to be addressed/monitored: HLD and DMII  Care Plan : PHARMD MEDICATION MANAGEMENT  Updates made by Lavera Guise, [place] since 03/10/2021 12:00 AM     Problem: DISEASE PROGRESSION PREVENTION      Long-Range Goal: T2DM   Recent Progress: Not on track  Priority: High  Note:   Current Barriers:  Unable to independently afford treatment regimen Unable to achieve control of  T2DM  Suboptimal therapeutic regimen for T2DM  Pharmacist Clinical Goal(s):  Over the next 180 days, patient will verbalize ability to afford treatment regimen achieve control of T2DM as evidenced by IMPROVED GLYCEMIC CONTROL; GOAL A1C<7%  through collaboration with PharmD and provider.    Interventions: 1:1 collaboration with Claretta Fraise, MD regarding development and update of comprehensive plan of care as evidenced by provider attestation and co-signature Inter-disciplinary care team collaboration (see longitudinal plan of care) Comprehensive medication review performed; medication list updated in electronic medical record  Diabetes: Uncontrolled, A1C 7.5%, IMPROVING;  Current treatment:OZEMPIC 1MG SQ WEEKLY WEDNESDAYS, LEVEMIR 60 UNITS EACH MORNING, METFORMIN 1 G TWICE DAILY WITH MEALS-GFR 91;  NOTED SGTL2 INTOLERANCE TRANSITION LEVEMIR TO TRESBIBA (ONCE PATIENT ASSISTANCE SHIPMENT ARRIVES)--WILL START TRESIBA 24 UNITS CONTINUE OZEMPIC TO 1MG SQ WEEKLY Denies personal and family history of Medullary thyroid cancer (MTC) TOLERATING WELL; DENIES SIDE EFFECTS AWAITING SHIPMENT FROM [facility] PAP Current glucose readings: fasting glucose: 90-140, post prandial glucose: <190 Denies hypoglycemic/hyperglycemic symptoms Discussed meal planning options and Plate method for healthy eating Avoid sugary drinks and desserts Incorporate balanced protein, non starchy veggies, 1 serving of carbohydrate with each meal Increase water intake Increase physical activity as able Current exercise: N/A Recommended NEW INSULIN, WILL WORK TO TITRATE DOSES Assessed patient finances. APPLICATION SUBMITTED TO [facility] PATIENT ASSISTANCE PROGRAM--OZEMPIC 1MG SQ WEEKLY, TRESIBA 55 UNITS DAILY, PEN NEEDLES; WILL LIKELY BE APPROVED AND SHIP TO PCP OFFICE IN 4 WEEKS   WILL UPDATE MEDICATION LIST ONCE MEDICATIONS ARRIVES AND PATIENT IS ACTUALLY TAKING DISCUSSED HYPERLIPIDEMIA/COMPLIANCE WITH  MEDICATIONS   Patient Goals/Self-Care Activities Over the next 180 days, patient will:  - take medications as prescribed check glucose DAILY (FASTING) OR IF SYMPTOMATIC, document, and provide at future appointments collaborate with provider on medication access solutions  Follow Up Plan: Telephone follow up appointment with care management team member scheduled for: 03/2021      Medication Assistance: Application for TRESIBA, OZEMPIC  medication assistance program. in process.  Anticipated assistance start date TBD.  See plan of care for additional detail.  Patient's preferred pharmacy is:  [facility] [address] [place] - [address] [place] [address] Phone: [phone] Fax: [phone]  [facility] - [place], [place] [address] Phone: [phone] Fax: [phone]   Follow Up:  Patient agrees to Care Plan and Follow-up.  Plan: Telephone follow up appointment with care management team member scheduled for:  1 MONTH   Regina Eck, PharmD, BCPS Clinical Pharmacist, [place]  II Phone [phone]

## 2021-03-10 NOTE — Patient Instructions (Signed)
Visit Information  Thank you for taking time to visit with me today. Please don't hesitate to contact me if I can be of assistance to you before our next scheduled telephone appointment.  Following are the goals we discussed today:  (Copy and paste patient goals from clinical care plan here)  Our next appointment is by telephone IN 03/2021   Please call the care guide team at [phone] if you need to cancel or reschedule your appointment.     The patient verbalized understanding of instructions, educational materials, and care plan provided today and declined offer to receive copy of patient instructions, educational materials, and care plan.   Signature Regina Eck, PharmD, BCPS Clinical Pharmacist, [place]  II Phone [phone]

## 2021-03-18 ENCOUNTER — Other Ambulatory Visit: Payer: Self-pay | Admitting: Family Medicine

## 2021-03-18 DIAGNOSIS — Z794 Long term (current) use of insulin: Secondary | ICD-10-CM

## 2021-03-18 DIAGNOSIS — E119 Type 2 diabetes mellitus without complications: Secondary | ICD-10-CM

## 2021-03-18 DIAGNOSIS — E782 Mixed hyperlipidemia: Secondary | ICD-10-CM

## 2021-03-19 ENCOUNTER — Ambulatory Visit (INDEPENDENT_AMBULATORY_CARE_PROVIDER_SITE_OTHER): Payer: Medicare PPO | Admitting: Pharmacist

## 2021-03-19 DIAGNOSIS — Z794 Long term (current) use of insulin: Secondary | ICD-10-CM

## 2021-03-19 DIAGNOSIS — E785 Hyperlipidemia, unspecified: Secondary | ICD-10-CM

## 2021-03-19 DIAGNOSIS — E1169 Type 2 diabetes mellitus with other specified complication: Secondary | ICD-10-CM

## 2021-03-20 NOTE — Progress Notes (Signed)
Chronic Care Management Pharmacy Note  03/19/2021 Name:  [name] [name] MRN:  [id number] DOB:  08/13/1940  Summary: t2dm  Recommendations/Changes made from today's visit: Diabetes: Uncontrolled, A1C 7.5%, IMPROVING;  Current treatment:OZEMPIC 1MG SQ WEEKLY WEDNESDAYS, LEVEMIR 60 UNITS EACH MORNING, METFORMIN 1 G TWICE DAILY WITH MEALS-GFR 91;  NOTED SGTL2 INTOLERANCE TRANSITION LEVEMIR TO TRESBIBA (ONCE PATIENT ASSISTANCE SHIPMENT ARRIVES)--WILL START TRESIBA 84 UNITS CONTINUE OZEMPIC TO 1MG SQ WEEKLY Denies personal and family history of Medullary thyroid cancer (MTC) TOLERATING WELL; DENIES SIDE EFFECTS AWAITING SHIPMENT FROM [facility]-->re-enrollment sent for 2023 Ozempic sample given Current glucose readings: fasting glucose: 90-140, post prandial glucose: <190 Denies hypoglycemic/hyperglycemic symptoms Discussed meal planning options and Plate method for healthy eating Avoid sugary drinks and desserts Incorporate balanced protein, non starchy veggies, 1 serving of carbohydrate with each meal Increase water intake Increase physical activity as able Current exercise: N/A Recommended NEW INSULIN, WILL WORK TO TITRATE DOSES Assessed patient finances. APPLICATION SUBMITTED TO [facility] PATIENT ASSISTANCE PROGRAM--OZEMPIC 1MG SQ WEEKLY, TRESIBA 55 UNITS DAILY, PEN NEEDLES; WILL LIKELY BE APPROVED AND SHIP TO PCP OFFICE IN 4 WEEKS  WILL UPDATE MEDICATION LIST ONCE MEDICATIONS ARRIVES AND PATIENT IS ACTUALLY TAKING DISCUSSED HYPERLIPIDEMIA/COMPLIANCE WITH MEDICATIONS   Patient Goals/Self-Care Activities Over the next 180 days, patient will:  - take medications as prescribed check glucose DAILY (FASTING) OR IF SYMPTOMATIC, document, and provide at future appointments collaborate with provider on medication access solutions  Follow Up Plan: Telephone follow up appointment with care management team member scheduled for: 3 months   Plan:  Subjective: [name] [name]  is an 80 y.o. year old male who is a primary patient of Stacks, Cletus Gash, MD.  The CCM team was consulted for assistance with disease management and care coordination needs.    Engaged with patient by telephone for follow up visit in response to provider referral for pharmacy case management and/or care coordination services.   Consent to Services:  The patient was given information about Chronic Care Management services, agreed to services, and gave verbal consent prior to initiation of services.  Please see initial visit note for detailed documentation.   Patient Care Team: Claretta Fraise, MD as PCP - General (Family Medicine) Minus Breeding, MD as PCP - Cardiology (Cardiology) Calvert Cantor, MD as Consulting Physician (Ophthalmology) Minus Breeding, MD as Consulting Physician (Cardiology) Lavera Guise, [facility] as Pharmacist (Family Medicine)   Objective:  Lab Results  Component Value Date   CREATININE 0.90 03/05/2021   CREATININE 0.76 12/02/2020   CREATININE 0.91 09/01/2020    Lab Results  Component Value Date   HGBA1C 7.8 (H) 03/05/2021   Last diabetic Eye exam:  Lab Results  Component Value Date/Time   HMDIABEYEEXA Retinopathy (A) 02/23/2018 12:00 AM    Last diabetic Foot exam: No results found for: HMDIABFOOTEX      Component Value Date/Time   CHOL 78 (L) 03/05/2021 0800   CHOL 128 10/16/2012 1545   TRIG 71 03/05/2021 0800   TRIG 52 03/18/2016 1126   TRIG 102 10/16/2012 1545   HDL 37 (L) 03/05/2021 0800   HDL 53 03/18/2016 1126   HDL 45 10/16/2012 1545   CHOLHDL 2.1 03/05/2021 0800   LDLCALC 25 03/05/2021 0800   LDLCALC 66 12/13/2013 0842   LDLCALC 63 10/16/2012 1545    Hepatic Function Latest Ref Rng & Units 03/05/2021 12/02/2020 09/01/2020  Total Protein 6.0 - 8.5 g/dL 6.7 6.6 6.5  Albumin 3.7 - 4.7 g/dL 4.4 4.4  4.2  AST 0 - 40 IU/L 30 18 35  ALT 0 - 44 IU/L 25 19 36  Alk Phosphatase 44 - 121 IU/L 76 73 89  Total Bilirubin 0.0 - 1.2 mg/dL 0.3 0.5 0.3   Bilirubin, Direct 0.00 - 0.40 mg/dL - - -    Lab Results  Component Value Date/Time   TSH 2.760 03/05/2021 08:00 AM   TSH 3.270 12/02/2020 09:02 AM   FREET4 1.65 12/02/2020 09:02 AM   FREET4 1.15 08/26/2018 10:31 AM    CBC Latest Ref Rng & Units 03/05/2021 12/02/2020 09/01/2020  WBC 3.4 - 10.8 x10E3/uL 8.7 9.1 7.9  Hemoglobin 13.0 - 17.7 g/dL 14.1 13.9 14.2  Hematocrit 37.5 - 51.0 % 42.8 43.9 43.6  Platelets 150 - 450 x10E3/uL 225 222 233    Lab Results  Component Value Date/Time   VD25OH 46.6 10/25/2018 08:03 AM   VD25OH 45.5 06/07/2018 09:35 AM    Clinical ASCVD: Yes  The ASCVD Risk score (Arnett DK, et al., 2019) failed to calculate for the following reasons:   The 2019 ASCVD risk score is only valid for ages 44 to 81   The patient has a prior MI or stroke diagnosis    Other: (CHADS2VASc if Afib, PHQ9 if depression, MMRC or CAT for COPD, ACT, DEXA)  Social History   Tobacco Use  Smoking Status Former   Packs/day: 1.00   Years: 27.00   Pack years: 27.00   Types: Cigarettes   Quit date: 04/20/1983   Years since quitting: 37.9  Smokeless Tobacco Never  Tobacco Comments   Quit 25 years ago   BP Readings from Last 3 Encounters:  03/05/21 126/60  12/02/20 110/68  09/01/20 113/64   Pulse Readings from Last 3 Encounters:  03/05/21 61  12/02/20 (!) 56  09/01/20 74   Wt Readings from Last 3 Encounters:  03/05/21 164 lb (74.4 kg)  12/02/20 159 lb 9.6 oz (72.4 kg)  10/14/20 165 lb (74.8 kg)    Assessment: Review of patient past medical history, allergies, medications, health status, including review of consultants reports, laboratory and other test data, was performed as part of comprehensive evaluation and provision of chronic care management services.   SDOH:  (Social Determinants of Health) assessments and interventions performed:    CCM Care Plan  Allergies  Allergen Reactions   Actos [Pioglitazone Hydrochloride] Swelling    Fluid retention    Lisinopril Rash   Cymbalta [Duloxetine Hcl] Other (See Comments)    Weakness. Legs likerubber, can't walk   Invokana [Canagliflozin] Rash   Niaspan [Niacin Er] Other (See Comments)    Increased blood sugar   Ramipril Diarrhea    Medications Reviewed Today     Reviewed by Lavera Guise, [facility] (Pharmacist) on 03/06/21 at 1021  Med List Status: <None>   Medication Order Taking? Sig Documenting Provider Last Dose Status Informant  Accu-Chek FastClix Lancets MISC [id number] No Check BS TID Dx E11.9 Claretta Fraise, MD Taking Active Self  acetaminophen (TYLENOL) 325 MG tablet [id number] No Take 2 tablets (650 mg total) by mouth every 4 (four) hours as needed for mild pain or headache. Cathlyn Parsons, PA-C Taking Active Self  amLODipine (NORVASC) 5 MG tablet [id number]  Take 1 tablet (5 mg total) by mouth daily. Claretta Fraise, MD  Active   aspirin EC 81 MG tablet [id number] No Take 1 tablet (81 mg total) by mouth daily. Minus Breeding, MD Taking Active Self  atorvastatin (LIPITOR) 80 MG tablet [id number]  Take  1 tablet (80 mg total) by mouth daily at 6 PM. Claretta Fraise, MD  Active   Blood Glucose Monitoring Suppl (ACCU-CHEK AVIVA PLUS) w/Device KIT [id number] No Check BS TID Dx E11.9 Claretta Fraise, MD Taking Active Self  Cholecalciferol 125 MCG (5000 UT) capsule [id number] No Take 5,000 Units by mouth. Take one tablet three times weekly  [provider] Taking Active Self  CINNAMON PO [id number] No Take by mouth 2 (two) times daily.  [provider] Taking Active Self  fluticasone (FLONASE) 50 MCG/ACT nasal spray [id number] No Place 2 sprays into both nostrils daily as needed for allergies. Claretta Fraise, MD Taking Active   Garlic 831 MG TABS [id number] No Take by mouth 2 (two) times daily.  [provider] Taking Active Self  glucose blood (ACCU-CHEK AVIVA PLUS) test strip [id number] No Check BS TID Dx E11.9 Claretta Fraise, MD Taking Active Self  insulin detemir (LEVEMIR)  100 UNIT/ML FlexPen [id number] No Inject 60 Units into the skin daily. Claretta Fraise, MD Taking Active Self  irbesartan (AVAPRO) 150 MG tablet [id number]  Take 1 tablet (150 mg total) by mouth daily. Claretta Fraise, MD  Active   levothyroxine (SYNTHROID) 75 MCG tablet [id number]  Take 1 tablet (75 mcg total) by mouth daily. Claretta Fraise, MD  Active   metFORMIN (GLUCOPHAGE) 1000 MG tablet [id number]  Take 1 tablet (1,000 mg total) by mouth 2 (two) times daily with a meal. Claretta Fraise, MD  Active   metoprolol tartrate (LOPRESSOR) 50 MG tablet [id number]  Take 0.5 tablets (25 mg total) by mouth daily. Claretta Fraise, MD  Active   Semaglutide, 1 MG/DOSE, (OZEMPIC, 1 MG/DOSE,) 4 MG/3ML SOPN [id number]  Inject 1 mg into the skin once a week. Claretta Fraise, MD  Active             Patient Active Problem List   Diagnosis Date Noted   Ventricular fibrillation ([place]) 08/21/2019   Educated about COVID-19 virus infection 08/21/2019   History of cardiac arrest 12/13/2018   Coronary artery disease involving native coronary artery of native heart without angina pectoris 12/13/2018   PAF (paroxysmal atrial fibrillation) ([place]) 12/13/2018   Debility 08/30/2018   CAD S/P percutaneous coronary angioplasty 08/30/2018   Atrial fibrillation, chronic ([place]) 08/30/2018   T2DM (type 2 diabetes mellitus) ([place]) 31/54/0086   Acute diastolic congestive heart failure ([place])    Aspiration pneumonia of both lungs (HCC)    Atrial fibrillation with rapid ventricular response ([place])    Diabetes mellitus type 2 in nonobese ([place])    Dyslipidemia    Diabetic retinopathy ([place]) 05/22/2017   Peripheral vascular insufficiency ([place]) 03/18/2016   Family history of heart disease 03/18/2016   Right inguinal hernia 12/13/2013   Essential hypertension 12/13/2013   BPH (benign prostatic hyperplasia) 12/13/2013   Hypothyroidism 08/07/2013   Vitamin D deficiency 04/02/2013   Goiter, nontoxic, multinodular    Kidney stone     Hyperlipidemia 01/15/2009   ABNORMAL ELECTROCARDIOGRAM 01/15/2009   Type 2 diabetes mellitus treated with insulin ([place]) 01/09/2009   Diaphragmatic hernia 01/09/2009   BASAL CELL CARCINOMA, HX OF 01/09/2009   DIVERTICULITIS, HX OF 01/09/2009    Immunization History  Administered Date(s) Administered   Fluad Quad(high Dose 65+) 02/20/2019, 02/13/2020, 03/05/2021   Influenza Whole 01/17/2010   Influenza, High Dose Seasonal PF 01/31/2015, 02/18/2016, 02/16/2017, 01/25/2018   Influenza,inj,Quad PF,6+ Mos 02/06/2014   Influenza-Unspecified 02/17/2013   Moderna SARS-COV2 Booster Vaccination 03/26/2020   Moderna Sars-Covid-2 Vaccination 05/29/2019, 06/26/2019   Pneumococcal Conjugate-13  04/02/2013   Pneumococcal Polysaccharide-23 02/18/2008   Td 12/19/2006, 02/08/2011, 04/28/2017   Tdap 02/08/2011   Zoster, Live 10/28/2010    Conditions to be addressed/monitored: HLD and DMII  Care Plan : PHARMD MEDICATION MANAGEMENT  Updates made by Lavera Guise, RPH since 03/20/2021 12:00 AM     Problem: DISEASE PROGRESSION PREVENTION      Long-Range Goal: T2DM   Recent Progress: Not on track  Priority: High  Note:   Current Barriers:  Unable to independently afford treatment regimen Unable to achieve control of T2DM  Suboptimal therapeutic regimen for T2DM  Pharmacist Clinical Goal(s):  Over the next 180 days, patient will verbalize ability to afford treatment regimen achieve control of T2DM as evidenced by IMPROVED GLYCEMIC CONTROL; GOAL A1C<7%  through collaboration with PharmD and provider.    Interventions: 1:1 collaboration with Claretta Fraise, MD regarding development and update of comprehensive plan of care as evidenced by provider attestation and co-signature Inter-disciplinary care team collaboration (see longitudinal plan of care) Comprehensive medication review performed; medication list updated in electronic medical record  Diabetes: Uncontrolled, A1C 7.5%, IMPROVING;   Current treatment:OZEMPIC 1MG SQ WEEKLY WEDNESDAYS, LEVEMIR 60 UNITS EACH MORNING, METFORMIN 1 G TWICE DAILY WITH MEALS-GFR 91;  NOTED SGTL2 INTOLERANCE TRANSITION LEVEMIR TO TRESBIBA (ONCE PATIENT ASSISTANCE SHIPMENT ARRIVES)--WILL START TRESIBA 52 UNITS CONTINUE OZEMPIC TO 1MG SQ WEEKLY Denies personal and family history of Medullary thyroid cancer (MTC) TOLERATING WELL; DENIES SIDE EFFECTS AWAITING SHIPMENT FROM [facility] [place] [facility]-->re-enrollment sent for 2023 Ozempic sample given Current glucose readings: fasting glucose: 90-140, post prandial glucose: <190 Denies hypoglycemic/hyperglycemic symptoms Discussed meal planning options and Plate method for healthy eating Avoid sugary drinks and desserts Incorporate balanced protein, non starchy veggies, 1 serving of carbohydrate with each meal Increase water intake Increase physical activity as able Current exercise: N/A Recommended NEW INSULIN, WILL WORK TO TITRATE DOSES Assessed patient finances. APPLICATION SUBMITTED TO [facility] PATIENT ASSISTANCE PROGRAM--OZEMPIC 1MG SQ WEEKLY, TRESIBA 55 UNITS DAILY, PEN NEEDLES; WILL LIKELY BE APPROVED AND SHIP TO PCP OFFICE IN 4 WEEKS   WILL UPDATE MEDICATION LIST ONCE MEDICATIONS ARRIVES AND PATIENT IS ACTUALLY TAKING DISCUSSED HYPERLIPIDEMIA/COMPLIANCE WITH MEDICATIONS   Patient Goals/Self-Care Activities Over the next 180 days, patient will:  - take medications as prescribed check glucose DAILY (FASTING) OR IF SYMPTOMATIC, document, and provide at future appointments collaborate with provider on medication access solutions  Follow Up Plan: Telephone follow up appointment with care management team member scheduled for: 3 months      Medication Assistance: Application for ozempic, tresiba  medication assistance program. in process.  Anticipated assistance start date tbd.  See plan of care for additional detail.  Patient's preferred pharmacy is:  [facility] [address] [place] -  [place] [address] [place] [address] Phone: [phone] Fax: [phone]  [place] [place] [address] Phone: [phone] Fax: [phone]  Uses pill box? No - n/a Pt endorses 100% compliance  Follow Up:  Patient agrees to Care Plan and Follow-up.  Plan: Telephone follow up appointment with care management team member scheduled for:  3 months   Regina Eck, PharmD, BCPS Clinical Pharmacist, [place]  II Phone [phone]

## 2021-03-20 NOTE — Patient Instructions (Signed)
Visit Information  Thank you for taking time to visit with me today. Please don't hesitate to contact me if I can be of assistance to you before our next scheduled telephone appointment.  Following are the goals we discussed today:  Current Barriers:  Unable to independently afford treatment regimen Unable to achieve control of T2DM  Suboptimal therapeutic regimen for T2DM  Pharmacist Clinical Goal(s):  Over the next 180 days, patient will verbalize ability to afford treatment regimen achieve control of T2DM as evidenced by IMPROVED GLYCEMIC CONTROL; GOAL A1C<7% through collaboration with PharmD and provider.    Interventions: 1:1 collaboration with Claretta Fraise, MD regarding development and update of comprehensive plan of care as evidenced by provider attestation and co-signature Inter-disciplinary care team collaboration (see longitudinal plan of care) Comprehensive medication review performed; medication list updated in electronic medical record  Diabetes: Uncontrolled, A1C 7.5%, IMPROVING;  Current treatment:OZEMPIC 1MG  SQ WEEKLY WEDNESDAYS, LEVEMIR 60 UNITS EACH MORNING, METFORMIN 1 G TWICE DAILY WITH MEALS-GFR 91;  NOTED SGTL2 INTOLERANCE TRANSITION LEVEMIR TO TRESBIBA (ONCE PATIENT ASSISTANCE SHIPMENT ARRIVES)--WILL START TRESIBA 36 UNITS CONTINUE OZEMPIC TO 1MG  SQ WEEKLY Denies personal and family history of Medullary thyroid cancer (MTC) TOLERATING WELL; DENIES SIDE EFFECTS AWAITING SHIPMENT FROM [facility] [place] PAP-->re-enrollment sent for 2023 Ozempic sample given Current glucose readings: fasting glucose: 90-140, post prandial glucose: <190 Denies hypoglycemic/hyperglycemic symptoms Discussed meal planning options and Plate method for healthy eating Avoid sugary drinks and desserts Incorporate balanced protein, non starchy veggies, 1 serving of carbohydrate with each meal Increase water intake Increase physical activity as able Current exercise: N/A Recommended NEW  INSULIN, WILL WORK TO TITRATE DOSES Assessed patient finances. APPLICATION SUBMITTED TO [facility] PATIENT ASSISTANCE PROGRAM--OZEMPIC 1MG  SQ WEEKLY, TRESIBA 55 UNITS DAILY, PEN NEEDLES; WILL LIKELY BE APPROVED AND SHIP TO PCP OFFICE IN 4 WEEKS  WILL UPDATE MEDICATION LIST ONCE MEDICATIONS ARRIVES AND PATIENT IS ACTUALLY TAKING DISCUSSED HYPERLIPIDEMIA/COMPLIANCE WITH MEDICATIONS   Patient Goals/Self-Care Activities Over the next 180 days, patient will:  - take medications as prescribed check glucose DAILY (FASTING) OR IF SYMPTOMATIC, document, and provide at future appointments collaborate with provider on medication access solutions  Follow Up Plan: Telephone follow up appointment with care management team member scheduled for: 3 months   Please call the care guide team at [phone] if you need to cancel or reschedule your appointment.   The patient verbalized understanding of instructions, educational materials, and care plan provided today and declined offer to receive copy of patient instructions, educational materials, and care plan.   Regina Eck, PharmD, BCPS Clinical Pharmacist, [place]  II Phone [phone]

## 2021-04-08 ENCOUNTER — Telehealth: Payer: Self-pay

## 2021-04-08 ENCOUNTER — Telehealth: Payer: Self-pay | Admitting: Family Medicine

## 2021-04-08 NOTE — Telephone Encounter (Signed)
Spoke with pt to inform him of re-enrollment approval. Also informed pt his last shipment for the 2022 year is still processing as of 03/15/21. There are still supply issues with this med.   Received notification from [place] [facility] regarding approval for [place]. Patient assistance approved from 04/19/21 to 04/18/22.  Phone: [phone]

## 2021-04-08 NOTE — Telephone Encounter (Signed)
Medication is not here, pt aware one Ozempic sample pen given

## 2021-04-18 DIAGNOSIS — E785 Hyperlipidemia, unspecified: Secondary | ICD-10-CM

## 2021-04-18 DIAGNOSIS — E1169 Type 2 diabetes mellitus with other specified complication: Secondary | ICD-10-CM

## 2021-04-18 DIAGNOSIS — Z794 Long term (current) use of insulin: Secondary | ICD-10-CM

## 2021-05-06 ENCOUNTER — Telehealth: Payer: Self-pay | Admitting: Pharmacist

## 2021-05-06 NOTE — Telephone Encounter (Signed)
Ozempic 1mg  patient assistance has arrived  # 2 boxes came in  Placed in fridge Left VM on home phone No answer on mobile

## 2021-06-08 ENCOUNTER — Ambulatory Visit (INDEPENDENT_AMBULATORY_CARE_PROVIDER_SITE_OTHER): Payer: Medicare HMO | Admitting: Family Medicine

## 2021-06-08 ENCOUNTER — Encounter: Payer: Self-pay | Admitting: Family Medicine

## 2021-06-08 VITALS — BP 115/71 | HR 68 | Temp 97.1°F | Ht 70.0 in | Wt 165.6 lb

## 2021-06-08 DIAGNOSIS — E1169 Type 2 diabetes mellitus with other specified complication: Secondary | ICD-10-CM | POA: Diagnosis not present

## 2021-06-08 DIAGNOSIS — Z794 Long term (current) use of insulin: Secondary | ICD-10-CM | POA: Diagnosis not present

## 2021-06-08 DIAGNOSIS — Z23 Encounter for immunization: Secondary | ICD-10-CM | POA: Diagnosis not present

## 2021-06-08 DIAGNOSIS — E782 Mixed hyperlipidemia: Secondary | ICD-10-CM | POA: Diagnosis not present

## 2021-06-08 DIAGNOSIS — E034 Atrophy of thyroid (acquired): Secondary | ICD-10-CM

## 2021-06-08 DIAGNOSIS — I10 Essential (primary) hypertension: Secondary | ICD-10-CM

## 2021-06-08 LAB — LIPID PANEL

## 2021-06-08 LAB — BAYER DCA HB A1C WAIVED: HB A1C (BAYER DCA - WAIVED): 8.1 % — ABNORMAL HIGH (ref 4.8–5.6)

## 2021-06-08 NOTE — Addendum Note (Signed)
Addended by: Christia Reading on: 06/08/2021 09:29 AM   Modules accepted: Orders

## 2021-06-08 NOTE — Progress Notes (Signed)
Subjective:  Patient ID: [name] [name], male    DOB: 1940/07/23  Age: 81 y.o. MRN: [id number]  CC: Medical Management of Chronic Issues   HPI [name] [name] presents forFollow-up of diabetes. Patient checks blood sugar at home.   About 176fsting and 148 postprandial Patient denies symptoms such as polyuria, polydipsia, excessive hunger, nausea No significant hypoglycemic spells noted. Medications reviewed. Pt reports taking them regularly without complication/adverse reaction being reported today. Just increased the Ozempic 2 weeks ago.  Had eye apt 2 mos ago.  History [name] a past medical history of Basal cell carcinoma, CAD (coronary artery disease), Cardiac arrest ([place], Cataract, Diabetes mellitus without complication ([place], Diverticulitis, Erectile dysfunction, Gastroesophageal reflux disease with hiatal hernia, Goiter, nontoxic, multinodular, Hiatal hernia, Hyperlipidemia, Inguinal hernia, Kidney stone (1980), Leg fracture, Liver hemangioma, Nephrolithiasis, and Vitamin D deficiency.   He has a past surgical history that includes LEFT HEART CATH AND CORONARY ANGIOGRAPHY (N/A, 08/25/2018); CORONARY STENT INTERVENTION (N/A, 08/25/2018); Laceration repair (2008); Back surgery (1984); Tibia fracture surgery; Cataract extraction (Bilateral); Eye surgery; Adjacent tissue transfer/tissue rearrangement (Left, 06/20/2020); Full thickness skin graft (Left, 06/20/2020); and skin cancer removal.   His family history includes Cancer in his father; Diabetes in an other family member; Early death (age of onset: 322 in his brother; Heart attack in his sister; Heart disease in his brother; Heart disease (age of onset: 780 in his mother.He reports that he quit smoking about 38 years ago. His smoking use included cigarettes. He has a 27.00 pack-year smoking history. He has never used smokeless tobacco. He reports that he does not drink alcohol and does not use drugs.  Current Outpatient Medications on File  Prior to Visit  Medication Sig Dispense Refill   Accu-Chek FastClix Lancets MISC Check BS TID Dx E11.9 300 each 3   acetaminophen (TYLENOL) 325 MG tablet Take 2 tablets (650 mg total) by mouth every 4 (four) hours as needed for mild pain or headache.     amLODipine (NORVASC) 5 MG tablet Take 1 tablet (5 mg total) by mouth daily. 90 tablet 1   aspirin EC 81 MG tablet Take 1 tablet (81 mg total) by mouth daily. 90 tablet 3   atorvastatin (LIPITOR) 80 MG tablet Take 1 tablet (80 mg total) by mouth daily at 6 PM. 90 tablet 1   Blood Glucose Monitoring Suppl (ACCU-CHEK AVIVA PLUS) w/Device KIT Check BS TID Dx E11.9 1 kit 0   Cholecalciferol 125 MCG (5000 UT) capsule Take 5,000 Units by mouth. Take one tablet three times weekly      CINNAMON PO Take by mouth 2 (two) times daily.      fluticasone (FLONASE) 50 MCG/ACT nasal spray Place 2 sprays into both nostrils daily as needed for allergies. 163.8mL 11   Garlic 1177MG TABS Take by mouth 2 (two) times daily.      glucose blood (ACCU-CHEK AVIVA PLUS) test strip Check BS TID Dx E11.9 300 each 3   insulin detemir (LEVEMIR) 100 UNIT/ML FlexPen Inject 60 Units into the skin daily. 45 mL 3   irbesartan (AVAPRO) 150 MG tablet Take 1 tablet (150 mg total) by mouth daily. 90 tablet 1   levothyroxine (SYNTHROID) 75 MCG tablet Take 1 tablet (75 mcg total) by mouth daily. 90 tablet 1   metFORMIN (GLUCOPHAGE) 1000 MG tablet Take 1 tablet (1,000 mg total) by mouth 2 (two) times daily with a meal. 180 tablet 1   metoprolol tartrate (LOPRESSOR) 50 MG tablet Take  0.5 tablets (25 mg total) by mouth daily. 45 tablet 1   Semaglutide, 1 MG/DOSE, (OZEMPIC, 1 MG/DOSE,) 4 MG/3ML SOPN Inject 1 mg into the skin once a week. 9 mL 1   No current facility-administered medications on file prior to visit.    ROS Review of Systems  Constitutional:  Negative for fever.  Respiratory:  Negative for shortness of breath.   Cardiovascular:  Negative for chest pain.  Musculoskeletal:   Negative for arthralgias.  Skin:  Negative for rash.   Objective:  BP 115/71    Pulse 68    Temp (!) 97.1 F (36.2 C)    Ht _0  (1.778 m)    Wt 165 lb 9.6 oz (75.1 kg)    SpO2 98%    BMI 23.76 kg/m   BP Readings from Last 3 Encounters:  06/08/21 115/71  03/05/21 126/60  12/02/20 110/68    Wt Readings from Last 3 Encounters:  06/08/21 165 lb 9.6 oz (75.1 kg)  03/05/21 164 lb (74.4 kg)  12/02/20 159 lb 9.6 oz (72.4 kg)     Physical Exam Vitals reviewed.  Constitutional:      Appearance: He is well-developed.  HENT:     Head: Normocephalic and atraumatic.     Right Ear: External ear normal.     Left Ear: External ear normal.     Mouth/Throat:     Pharynx: No oropharyngeal exudate or posterior oropharyngeal erythema.  Eyes:     Pupils: Pupils are equal, round, and reactive to light.  Cardiovascular:     Rate and Rhythm: Normal rate and regular rhythm.     Heart sounds: No murmur heard. Pulmonary:     Effort: No respiratory distress.     Breath sounds: Normal breath sounds.  Musculoskeletal:     Cervical back: Normal range of motion and neck supple.  Neurological:     Mental Status: He is alert and oriented to person, place, and time.      Assessment & Plan:   [name] [name] was seen today for medical management of chronic issues.  Diagnoses and all orders for this visit:  Type 2 diabetes mellitus with other specified complication, with long-term current use of insulin ([place]) -     Bayer DCA Hb A1c Waived  Mixed hyperlipidemia -     Lipid panel  Hypothyroidism due to acquired atrophy of thyroid -     TSH + free T4  Essential hypertension -     CBC with Differential/Platelet -     CMP14+EGFR      I have discontinued [name] [name]'s clopidogrel. I am also having him maintain his acetaminophen, Cholecalciferol, Garlic, Accu-Chek Aviva Plus, Accu-Chek Aviva Plus, Accu-Chek FastClix Lancets, CINNAMON PO, aspirin EC, insulin detemir, fluticasone, metoprolol  tartrate, metFORMIN, levothyroxine, irbesartan, atorvastatin, amLODipine, and Ozempic (1 MG/DOSE).  No orders of the defined types were placed in this encounter.    Follow-up: No follow-ups on file.  Claretta Fraise, M.D.

## 2021-06-09 LAB — CMP14+EGFR
ALT: 26 IU/L (ref 0–44)
AST: 28 IU/L (ref 0–40)
Albumin/Globulin Ratio: 1.8 (ref 1.2–2.2)
Albumin: 4.6 g/dL (ref 3.7–4.7)
Alkaline Phosphatase: 89 IU/L (ref 44–121)
BUN/Creatinine Ratio: 13 (ref 10–24)
BUN: 11 mg/dL (ref 8–27)
Bilirubin Total: 0.4 mg/dL (ref 0.0–1.2)
CO2: 23 mmol/L (ref 20–29)
Calcium: 10.3 mg/dL — ABNORMAL HIGH (ref 8.6–10.2)
Chloride: 100 mmol/L (ref 96–106)
Creatinine, Ser: 0.87 mg/dL (ref 0.76–1.27)
Globulin, Total: 2.6 g/dL (ref 1.5–4.5)
Glucose: 127 mg/dL — ABNORMAL HIGH (ref 70–99)
Potassium: 4.4 mmol/L (ref 3.5–5.2)
Sodium: 138 mmol/L (ref 134–144)
Total Protein: 7.2 g/dL (ref 6.0–8.5)
eGFR: 87 mL/min/{1.73_m2} (ref >59)

## 2021-06-09 LAB — CBC WITH DIFFERENTIAL/PLATELET
Basophils Absolute: 0.1 10*3/uL (ref 0.0–0.2)
Basos: 1 %
EOS (ABSOLUTE): 0.1 10*3/uL (ref 0.0–0.4)
Eos: 1 %
Hematocrit: 43.7 % (ref 37.5–51.0)
Hemoglobin: 14.1 g/dL (ref 13.0–17.7)
Immature Grans (Abs): 0 10*3/uL (ref 0.0–0.1)
Immature Granulocytes: 0 %
Lymphocytes Absolute: 1.6 10*3/uL (ref 0.7–3.1)
Lymphs: 19 %
MCH: 26.8 pg (ref 26.6–33.0)
MCHC: 32.3 g/dL (ref 31.5–35.7)
MCV: 83 fL (ref 79–97)
Monocytes Absolute: 0.8 10*3/uL (ref 0.1–0.9)
Monocytes: 9 %
Neutrophils Absolute: 5.8 10*3/uL (ref 1.4–7.0)
Neutrophils: 70 %
Platelets: 232 10*3/uL (ref 150–450)
RBC: 5.26 x10E6/uL (ref 4.14–5.80)
RDW: 13.1 % (ref 11.6–15.4)
WBC: 8.3 10*3/uL (ref 3.4–10.8)

## 2021-06-09 LAB — LIPID PANEL
Chol/HDL Ratio: 1.9 ratio (ref 0.0–5.0)
Cholesterol, Total: 70 mg/dL — ABNORMAL LOW (ref 100–199)
HDL: 37 mg/dL — ABNORMAL LOW (ref >39)
LDL Chol Calc (NIH): 19 mg/dL (ref 0–99)
Triglycerides: 57 mg/dL (ref 0–149)
VLDL Cholesterol Cal: 14 mg/dL (ref 5–40)

## 2021-06-09 LAB — TSH+FREE T4
Free T4: 1.6 ng/dL (ref 0.82–1.77)
TSH: 2.28 u[IU]/mL (ref 0.450–4.500)

## 2021-06-09 NOTE — Progress Notes (Signed)
Hello Dorrell,  Your lab result is normal and/or stable.Some minor variations that are not significant are commonly marked abnormal, but do not represent any medical problem for you.  Best regards, Joelys Staubs, M.D.

## 2021-06-16 ENCOUNTER — Ambulatory Visit (INDEPENDENT_AMBULATORY_CARE_PROVIDER_SITE_OTHER): Payer: Medicare HMO | Admitting: Pharmacist

## 2021-06-16 DIAGNOSIS — I10 Essential (primary) hypertension: Secondary | ICD-10-CM

## 2021-06-16 DIAGNOSIS — E1169 Type 2 diabetes mellitus with other specified complication: Secondary | ICD-10-CM

## 2021-06-16 DIAGNOSIS — Z794 Long term (current) use of insulin: Secondary | ICD-10-CM

## 2021-06-16 NOTE — Progress Notes (Signed)
Chronic Care Management Pharmacy Note  06/16/2021 Name:  [name] [name] MRN:  [id number] DOB:  1941/02/02  Summary: T2DM, HLD  Recommendations/Changes made from today's visit:  Diabetes: Uncontrolled, A1C 7.5-->8.1% (has been without meds--PAP finally shipped)  Current treatment: OZEMPIC 1MG SQ WEEKLY WEDNESDAYS, Tresiba 60 UNITS EACH MORNING, METFORMIN 1 G TWICE DAILY WITH MEALS-GFR 91;  NOTED SGTL2 INTOLERANCE TRANSITION LEVEMIR TO TRESBIBA --WILL START TRESIBA 60 UNITS CONTINUE OZEMPIC TO 1MG SQ WEEKLY Denies personal and family history of Medullary thyroid cancer (MTC) TOLERATING WELL; DENIES SIDE EFFECTS AWAITING SHIPMENT FROM [facility] [place] PAP-->re-enrollment sent for 2023 Current glucose readings: fasting glucose: 90-140, post prandial glucose: <190 Denies hypoglycemic/hyperglycemic symptoms Discussed meal planning options and Plate method for healthy eating Avoid sugary drinks and desserts Incorporate balanced protein, non starchy veggies, 1 serving of carbohydrate with each meal Increase water intake Increase physical activity as able Current exercise: N/A Recommended NEW INSULIN, WILL WORK TO TITRATE DOSES Assessed patient finances. APPLICATION APPROVED/SHIPPED TODAY--OZEMPIC 1MG SQ WEEKLY, TRESIBA 60 UNITS DAILY, PEN NEEDLES;   WILL UPDATE MEDICATION LIST ONCE MEDICATIONS ARRIVES AND PATIENT IS ACTUALLY TAKING DISCUSSED HYPERLIPIDEMIA/COMPLIANCE WITH MEDICATIONS  Subjective: [name] [name] is an 81 y.o. year old male who is a primary patient of Stacks, Cletus Gash, MD.  The CCM team was consulted for assistance with disease management and care coordination needs.    Engaged with patient face to face for follow up visit in response to provider referral for pharmacy case management and/or care coordination services.   Consent to Services:  The patient was given information about Chronic Care Management services, agreed to services, and gave verbal consent prior to  initiation of services.  Please see initial visit note for detailed documentation.   Patient Care Team: Claretta Fraise, MD as PCP - General (Family Medicine) Minus Breeding, MD as PCP - Cardiology (Cardiology) Calvert Cantor, MD as Consulting Physician (Ophthalmology) Minus Breeding, MD as Consulting Physician (Cardiology) Lavera Guise, [facility] as Pharmacist (Family Medicine)  Objective:  Lab Results  Component Value Date   CREATININE 0.87 06/08/2021   CREATININE 0.90 03/05/2021   CREATININE 0.76 12/02/2020    Lab Results  Component Value Date   HGBA1C 8.1 (H) 06/08/2021   Last diabetic Eye exam:  Lab Results  Component Value Date/Time   HMDIABEYEEXA Retinopathy (A) 02/23/2018 12:00 AM    Last diabetic Foot exam: No results found for: HMDIABFOOTEX      Component Value Date/Time   CHOL 70 (L) 06/08/2021 0800   CHOL 128 10/16/2012 1545   TRIG 57 06/08/2021 0800   TRIG 52 03/18/2016 1126   TRIG 102 10/16/2012 1545   HDL 37 (L) 06/08/2021 0800   HDL 53 03/18/2016 1126   HDL 45 10/16/2012 1545   CHOLHDL 1.9 06/08/2021 0800   LDLCALC 19 06/08/2021 0800   LDLCALC 66 12/13/2013 0842   LDLCALC 63 10/16/2012 1545    Hepatic Function Latest Ref Rng & Units 06/08/2021 03/05/2021 12/02/2020  Total Protein 6.0 - 8.5 g/dL 7.2 6.7 6.6  Albumin 3.7 - 4.7 g/dL 4.6 4.4 4.4  AST 0 - 40 IU/L '28 30 18  ' ALT 0 - 44 IU/L '26 25 19  ' Alk Phosphatase 44 - 121 IU/L 89 76 73  Total Bilirubin 0.0 - 1.2 mg/dL 0.4 0.3 0.5  Bilirubin, Direct 0.00 - 0.40 mg/dL - - -    Lab Results  Component Value Date/Time   TSH 2.280 06/08/2021 08:00 AM   TSH 2.760 03/05/2021 08:00 AM  FREET4 1.60 06/08/2021 08:00 AM   FREET4 1.65 12/02/2020 09:02 AM    CBC Latest Ref Rng & Units 06/08/2021 03/05/2021 12/02/2020  WBC 3.4 - 10.8 x10E3/uL 8.3 8.7 9.1  Hemoglobin 13.0 - 17.7 g/dL 14.1 14.1 13.9  Hematocrit 37.5 - 51.0 % 43.7 42.8 43.9  Platelets 150 - 450 x10E3/uL 232 225 222    Lab Results  Component  Value Date/Time   VD25OH 46.6 10/25/2018 08:03 AM   VD25OH 45.5 06/07/2018 09:35 AM    Clinical ASCVD: Yes  The ASCVD Risk score (Arnett DK, et al., 2019) failed to calculate for the following reasons:   The 2019 ASCVD risk score is only valid for ages 59 to 43   The patient has a prior MI or stroke diagnosis    Other: (CHADS2VASc if Afib, PHQ9 if depression, MMRC or CAT for COPD, ACT, DEXA)  Social History   Tobacco Use  Smoking Status Former   Packs/day: 1.00   Years: 27.00   Pack years: 27.00   Types: Cigarettes   Quit date: 04/20/1983   Years since quitting: 38.1  Smokeless Tobacco Never  Tobacco Comments   Quit 25 years ago   BP Readings from Last 3 Encounters:  06/08/21 115/71  03/05/21 126/60  12/02/20 110/68   Pulse Readings from Last 3 Encounters:  06/08/21 68  03/05/21 61  12/02/20 (!) 56   Wt Readings from Last 3 Encounters:  06/08/21 165 lb 9.6 oz (75.1 kg)  03/05/21 164 lb (74.4 kg)  12/02/20 159 lb 9.6 oz (72.4 kg)    Assessment: Review of patient past medical history, allergies, medications, health status, including review of consultants reports, laboratory and other test data, was performed as part of comprehensive evaluation and provision of chronic care management services.   SDOH:  (Social Determinants of Health) assessments and interventions performed:    CCM Care Plan  Allergies  Allergen Reactions   Actos [Pioglitazone Hydrochloride] Swelling    Fluid retention   Lisinopril Rash   Cymbalta [Duloxetine Hcl] Other (See Comments)    Weakness. Legs likerubber, can't walk   Invokana [Canagliflozin] Rash   Niaspan [Niacin Er] Other (See Comments)    Increased blood sugar   Ramipril Diarrhea    Medications Reviewed Today     Reviewed by Claretta Fraise, MD (Physician) on 06/08/21 at [phone]  Med List Status: <None>   Medication Order Taking? Sig Documenting Provider Last Dose Status Informant  Accu-Chek FastClix Lancets MISC [id number] Yes  Check BS TID Dx E11.9 Claretta Fraise, MD Taking Active Self  acetaminophen (TYLENOL) 325 MG tablet [id number] Yes Take 2 tablets (650 mg total) by mouth every 4 (four) hours as needed for mild pain or headache. Cathlyn Parsons, PA-C Taking Active Self  amLODipine (NORVASC) 5 MG tablet [id number] Yes Take 1 tablet (5 mg total) by mouth daily. Claretta Fraise, MD Taking Active   aspirin EC 81 MG tablet [id number] Yes Take 1 tablet (81 mg total) by mouth daily. Minus Breeding, MD Taking Active Self  atorvastatin (LIPITOR) 80 MG tablet [id number] Yes Take 1 tablet (80 mg total) by mouth daily at 6 PM. Claretta Fraise, MD Taking Active   Blood Glucose Monitoring Suppl (ACCU-CHEK AVIVA PLUS) w/Device KIT [id number] Yes Check BS TID Dx E11.9 Claretta Fraise, MD Taking Active Self  Cholecalciferol 125 MCG (5000 UT) capsule [id number] Yes Take 5,000 Units by mouth. Take one tablet three times weekly  [provider] Taking Active Self  CINNAMON PO [id number] Yes Take  by mouth 2 (two) times daily.  [provider] Taking Active Self  fluticasone (FLONASE) 50 MCG/ACT nasal spray [id number] Yes Place 2 sprays into both nostrils daily as needed for allergies. Claretta Fraise, MD Taking Active   Garlic 224 MG TABS [id number] Yes Take by mouth 2 (two) times daily.  [provider] Taking Active Self  glucose blood (ACCU-CHEK AVIVA PLUS) test strip [id number] Yes Check BS TID Dx E11.9 Claretta Fraise, MD Taking Active Self  insulin detemir (LEVEMIR) 100 UNIT/ML FlexPen [id number] Yes Inject 60 Units into the skin daily. Claretta Fraise, MD Taking Active Self  irbesartan (AVAPRO) 150 MG tablet [id number] Yes Take 1 tablet (150 mg total) by mouth daily. Claretta Fraise, MD Taking Active   levothyroxine (SYNTHROID) 75 MCG tablet [id number] Yes Take 1 tablet (75 mcg total) by mouth daily. Claretta Fraise, MD Taking Active   metFORMIN (GLUCOPHAGE) 1000 MG tablet [id number] Yes Take 1 tablet (1,000 mg total) by  mouth 2 (two) times daily with a meal. Claretta Fraise, MD Taking Active   metoprolol tartrate (LOPRESSOR) 50 MG tablet [id number] Yes Take 0.5 tablets (25 mg total) by mouth daily. Claretta Fraise, MD Taking Active   Semaglutide, 1 MG/DOSE, (OZEMPIC, 1 MG/DOSE,) 4 MG/3ML SOPN [id number] Yes Inject 1 mg into the skin once a week. Claretta Fraise, MD Taking Active            Med Note Leisa Lenz May 06, 2021 11:25 AM) Via [employer] [facility] patient assistance program            Patient Active Problem List   Diagnosis Date Noted   Ventricular fibrillation ([place]) 08/21/2019   Educated about COVID-19 virus infection 08/21/2019   History of cardiac arrest 12/13/2018   Coronary artery disease involving native coronary artery of native heart without angina pectoris 12/13/2018   PAF (paroxysmal atrial fibrillation) ([place]) 12/13/2018   Debility 08/30/2018   CAD S/P percutaneous coronary angioplasty 08/30/2018   Atrial fibrillation, chronic ([place]) 08/30/2018   T2DM (type 2 diabetes mellitus) ([place]) 97/58/8325   Acute diastolic congestive heart failure ([place])    Aspiration pneumonia of both lungs (H[facility])    Atrial fibrillation with rapid ventricular response ([place])    Diabetes mellitus type 2 in nonobese ([place])    Dyslipidemia    Diabetic retinopathy ([place]) 05/22/2017   Peripheral vascular insufficiency ([place]) 03/18/2016   Family history of heart disease 03/18/2016   Right inguinal hernia 12/13/2013   Essential hypertension 12/13/2013   BPH (benign prostatic hyperplasia) 12/13/2013   Hypothyroidism 08/07/2013   Vitamin D deficiency 04/02/2013   Goiter, nontoxic, multinodular    Kidney stone    Hyperlipidemia 01/15/2009   ABNORMAL ELECTROCARDIOGRAM 01/15/2009   Type 2 diabetes mellitus treated with insulin ([place]) 01/09/2009   Diaphragmatic hernia 01/09/2009   BASAL CELL CARCINOMA, HX OF 01/09/2009   DIVERTICULITIS, HX OF 01/09/2009    Immunization History  Administered Date(s) Administered    Fluad Quad(high Dose 65+) 02/20/2019, 02/13/2020, 03/05/2021   Influenza Whole 01/17/2010   Influenza, High Dose Seasonal PF 01/31/2015, 02/18/2016, 02/16/2017, 01/25/2018   Influenza,inj,Quad PF,6+ Mos 02/06/2014   Influenza-Unspecified 02/17/2013   Moderna SARS-COV2 Booster Vaccination 03/26/2020   Moderna Sars-Covid-2 Vaccination 05/29/2019, 06/26/2019   Pneumococcal Conjugate-13 04/02/2013   Pneumococcal Polysaccharide-23 02/18/2008   Td 12/19/2006, 02/08/2011, 04/28/2017   Tdap 02/08/2011   Zoster Recombinat (Shingrix) 06/08/2021   Zoster, Live 10/28/2010    Conditions to be addressed/monitored: HLD and DMII  Care Plan : PHARMD MEDICATION MANAGEMENT  Updates made by Lavera Guise, RPH since 06/18/2021 12:00 AM     Problem: DISEASE PROGRESSION PREVENTION      Long-Range Goal: T2DM   Recent Progress: Not on track  Priority: High  Note:   Current Barriers:  Unable to independently afford treatment regimen Unable to achieve control of T2DM  Suboptimal therapeutic regimen for T2DM  Pharmacist Clinical Goal(s):  Over the next 180 days, patient will verbalize ability to afford treatment regimen achieve control of T2DM as evidenced by IMPROVED GLYCEMIC CONTROL; GOAL A1C<7%  through collaboration with PharmD and provider.    Interventions: 1:1 collaboration with Claretta Fraise, MD regarding development and update of comprehensive plan of care as evidenced by provider attestation and co-signature Inter-disciplinary care team collaboration (see longitudinal plan of care) Comprehensive medication review performed; medication list updated in electronic medical record  Diabetes: Uncontrolled, A1C 7.5-->8.1% (has been without meds--PAP finally shipped)  Current treatment: OZEMPIC 1MG SQ WEEKLY WEDNESDAYS, Tresiba [place], METFORMIN 1 G TWICE DAILY WITH MEALS-GFR 91;  NOTED SGTL2 INTOLERANCE TRANSITION LEVEMIR TO TRESBIBA (ONCE PATIENT ASSISTANCE SHIPMENT  ARRIVES)--WILL START TRESIBA [place] Denies personal and family history of Medullary thyroid cancer (MTC) TOLERATING WELL; DENIES SIDE EFFECTS AWAITING SHIPMENT FROM [facility] [place] PAP-->re-enrollment sent for 2023 Current glucose readings: fasting glucose: 90-140, post prandial glucose: <190 Denies hypoglycemic/hyperglycemic symptoms Discussed meal planning options and Plate method for healthy eating Avoid sugary drinks and desserts Incorporate balanced protein, non starchy veggies, 1 serving of carbohydrate with each meal Increase water intake Increase physical activity as able Current exercise: N/A Recommended NEW INSULIN, WILL WORK TO TITRATE DOSES Assessed patient finances. APPLICATION SUBMITTED TO [facility] [place] PATIENT ASSISTANCE PROGRAM--OZEMPIC 1MG SQ WEEKLY, TRESIBA 55 UNITS DAILY, PEN NEEDLES; WILL LIKELY BE APPROVED AND SHIP TO PCP OFFICE IN 4 WEEKS   WILL UPDATE MEDICATION LIST ONCE MEDICATIONS ARRIVES AND PATIENT IS ACTUALLY TAKING DISCUSSED HYPERLIPIDEMIA/COMPLIANCE WITH MEDICATIONS   Patient Goals/Self-Care Activities Over the next 180 days, patient will:  - take medications as prescribed check glucose DAILY (FASTING) OR IF SYMPTOMATIC, document, and provide at future appointments collaborate with provider on medication access solutions  Follow Up Plan: Telephone follow up appointment with care management team member scheduled for: 3 months      Medication Assistance:  TRESIBA/OZEMPIC obtained through [place] medication assistance program.  Enrollment ends 04/18/22  Patient's preferred pharmacy is:  [facility] [address], [address] [place] [address] Phone: [phone] Fax: [phone]  [facility] [place], [place] [address] Phone: [phone] Fax: [phone]  Follow Up:  Patient agrees to Care Plan and  Follow-up.  Plan: Telephone follow up appointment with care management team member scheduled for:  3 MONTHS   Regina Eck, PharmD, BCPS Clinical Pharmacist, [place]  II Phone [phone]

## 2021-06-18 MED ORDER — TRESIBA FLEXTOUCH 100 UNIT/ML ~~LOC~~ SOPN: 50.0000 [IU] | PEN_INJECTOR | Freq: Every day | SUBCUTANEOUS | 3 refills | Status: DC

## 2021-06-18 MED ORDER — TRESIBA FLEXTOUCH 100 UNIT/ML ~~LOC~~ SOPN: 60.0000 [IU] | PEN_INJECTOR | Freq: Every day | SUBCUTANEOUS | 3 refills | Status: AC

## 2021-06-18 NOTE — Patient Instructions (Signed)
Visit Information  Following are the goals we discussed today:  Current Barriers:  Unable to independently afford treatment regimen Unable to achieve control of T2DM  Suboptimal therapeutic regimen for T2DM  Pharmacist Clinical Goal(s):  Over the next 180 days, patient will verbalize ability to afford treatment regimen achieve control of T2DM as evidenced by IMPROVED GLYCEMIC CONTROL; GOAL A1C<7% through collaboration with PharmD and provider.    Interventions: 1:1 collaboration with Claretta Fraise, MD regarding development and update of comprehensive plan of care as evidenced by provider attestation and co-signature Inter-disciplinary care team collaboration (see longitudinal plan of care) Comprehensive medication review performed; medication list updated in electronic medical record  Diabetes: Uncontrolled, A1C 7.5-->8.1% (has been without meds--PAP finally shipped)  Current treatment: OZEMPIC 1MG  SQ WEEKLY WEDNESDAYS, Tresiba 60 UNITS EACH MORNING, METFORMIN 1 G TWICE DAILY WITH MEALS-GFR 91;  NOTED SGTL2 INTOLERANCE TRANSITION LEVEMIR TO [place] (ONCE PATIENT ASSISTANCE SHIPMENT ARRIVES)--WILL START TRESIBA 38 UNITS CONTINUE OZEMPIC TO 1MG  SQ WEEKLY Denies personal and family history of Medullary thyroid cancer (MTC) TOLERATING WELL; DENIES SIDE EFFECTS AWAITING SHIPMENT FROM [facility]OV[facility] [place] PAP-->re-enrollment sent for 2023 Current glucose readings: fasting glucose: 90-140, post prandial glucose: <190 Denies hypoglycemic/hyperglycemic symptoms Discussed meal planning options and Plate method for healthy eating Avoid sugary drinks and desserts Incorporate balanced protein, non starchy veggies, 1 serving of carbohydrate with each meal Increase water intake Increase physical activity as able Current exercise: N/A Recommended NEW INSULIN, WILL WORK TO TITRATE DOSES Assessed patient finances. APPLICATION SUBMITTED TO [facility] [place] PATIENT ASSISTANCE PROGRAM--OZEMPIC 1MG  SQ WEEKLY,  TRESIBA 55 UNITS DAILY, PEN NEEDLES; WILL LIKELY BE APPROVED AND SHIP TO PCP OFFICE IN 4 WEEKS  WILL UPDATE MEDICATION LIST ONCE MEDICATIONS ARRIVES AND PATIENT IS ACTUALLY TAKING DISCUSSED HYPERLIPIDEMIA/COMPLIANCE WITH MEDICATIONS   Patient Goals/Self-Care Activities Over the next 180 days, patient will:  - take medications as prescribed check glucose DAILY (FASTING) OR IF SYMPTOMATIC, document, and provide at future appointments collaborate with provider on medication access solutions  Follow Up Plan: Telephone follow up appointment with care management team member scheduled for: 3 months   Plan: Telephone follow up appointment with care management team member scheduled for:  3 months  Signature Regina Eck, PharmD, BCPS Clinical Pharmacist, [place]  II Phone [phone]   Please call the care guide team at [phone] if you need to cancel or reschedule your appointment.   The patient verbalized understanding of instructions, educational materials, and care plan provided today and declined offer to receive copy of patient instructions, educational materials, and care plan.

## 2021-08-03 ENCOUNTER — Other Ambulatory Visit: Payer: Self-pay | Admitting: Family Medicine

## 2021-08-03 DIAGNOSIS — I10 Essential (primary) hypertension: Secondary | ICD-10-CM

## 2021-08-03 DIAGNOSIS — E119 Type 2 diabetes mellitus without complications: Secondary | ICD-10-CM

## 2021-08-05 ENCOUNTER — Telehealth: Payer: Self-pay | Admitting: Family Medicine

## 2021-08-05 NOTE — Telephone Encounter (Signed)
Pt called to let Almyra Free know that he is almost out of his Levemir and Ozempic Rx's. ?

## 2021-08-05 NOTE — Telephone Encounter (Signed)
Can we do refills for ozempic and levemir? ?[facility]? ?

## 2021-08-07 NOTE — Telephone Encounter (Signed)
Patient returned call and information was given ?

## 2021-08-07 NOTE — Telephone Encounter (Signed)
Lmtcb.

## 2021-08-07 NOTE — Telephone Encounter (Signed)
Please let patient know: ? ?Refills were sent to [facility] patient assistance program for ozempic '1mg'$  and tresiba 60 units daily ? ?We will call patient when shipment arrives ? ?Shipping delays are still an issue ? ?Estimated arrival 2-4 weeks ?

## 2021-08-10 NOTE — Telephone Encounter (Signed)
Called [employer] [facility] to f/u on refills that were faxed, was told pt's enrollment ended 03/2021... not sure what happened with that!! Pt needs to re-enroll for all meds (tresiba, levemir, ozempic). I can mail pt application if needed. ?

## 2021-08-11 NOTE — Telephone Encounter (Signed)
Thanks! Can you call patient and arrange? ?I'm happy to do application, but if he can bring his financials,etc ? ?Thank you! ?

## 2021-08-13 ENCOUNTER — Telehealth: Payer: Self-pay | Admitting: Pharmacist

## 2021-08-13 NOTE — Telephone Encounter (Signed)
Application faxed for [facility] patient assistance (ozempic and insulin-tresiba) ?

## 2021-08-19 ENCOUNTER — Telehealth: Payer: Self-pay

## 2021-08-19 NOTE — Telephone Encounter (Signed)
Received notification from [place] regarding approval for [place]. Patient assistance approved from 08/18/21 to 03/18/22. ? ?4 MONTH SUPPLY OF BOTH MEDICATIONS PROCESSING FOR SHIPPING ? ?Phone: [phone] ? ?

## 2021-08-20 ENCOUNTER — Telehealth: Payer: Self-pay | Admitting: Family Medicine

## 2021-08-20 NOTE — Telephone Encounter (Signed)
You can get him samples if available ?We don't have levemir--can use tresiba ?May be some ozempic in fridge ?

## 2021-08-20 NOTE — Telephone Encounter (Signed)
Samples of Tresiba left in fridge. Wife notified and verbalized understanding ?

## 2021-08-20 NOTE — Telephone Encounter (Signed)
Pt called to let Almyra Free know that he is almost out of his Ozempic and Levemir Rx's.  ?

## 2021-08-25 NOTE — Progress Notes (Signed)
?  ?Cardiology Office Note ? ? ?Date:  08/26/2021  ? ?ID:  [name] [name], DOB 03-11-1941, MRN [id number] ? ?PCP:  Claretta Fraise, MD  ?Cardiologist:   Minus Breeding, MD ? ? ?Chief Complaint  ?Patient presents with  ? Coronary Artery Disease  ? ? ?  ?History of Present Illness: ?[name] [name] is a 81 y.o. male who presents for follow up of multiple cardiovascular risk factors.  I saw him in Feb 2016 for evaluation of this same thing.  He has diabetes that is not well controlled.  He did have a POET (Plain Old Exercise Treadmill) in 2012 that was negative for ischemia but there was some brief atrial fib post test.  He had cardiac arrest in April 2020. cardiac catheterization showed severe proximal LAD stenosis and he underwent successful stenting and was extubated 08/23/2018.  His hospital course complicated by atrial fibrillation with RVR maintained on aspirin Plavix as well as Eliquis and amiodarone.  He went to rehab after discharge ? ?He presents for follow-up.  Since I last saw him he has done well.   He is gardening.  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 The patient denies any new symptoms such as chest discomfort, neck or arm discomfort. There has been no new shortness of breath, PND or orthopnea. There have been no reported palpitations, presyncope or syncope. ?  ? ?Past Medical History:  ?Diagnosis Date  ? Basal cell carcinoma   ? left forehead, nose, neck  ? CAD (coronary artery disease)   ? a. s/p DES to Prox-LAD and DES to LCx in 08/2018  ? Cardiac arrest [facility])   ? Cataract   ? Diabetes mellitus without complication ([place])   ? Diverticulitis   ? Erectile dysfunction   ? Gastroesophageal reflux disease with hiatal hernia   ? Goiter, nontoxic, multinodular   ? with macrocyst  ? Hiatal hernia   ? With reflex  ? Hyperlipidemia   ? Inguinal hernia   ? right side  ? Kidney stone 1980  ? Leg fracture   ? Left  ? Liver hemangioma   ? Nephrolithiasis   ? Vitamin D deficiency   ? ? ?Past Surgical History:  ?Procedure  Laterality Date  ? ADJACENT TISSUE TRANSFER/TISSUE REARRANGEMENT Left 06/20/2020  ? Procedure: Left nasal reconstruction with adjacent tissue transfer;  Surgeon: [name], MD;  Location: [facility];  Service: Plastics;  Laterality: Left;  90 min, please  ? BACK SURGERY  1984  ? CATARACT EXTRACTION Bilateral   ? CORONARY STENT INTERVENTION N/A 08/25/2018  ? Procedure: CORONARY STENT INTERVENTION;  Surgeon: Sherren Mocha, MD;  Location: [place] [facility];  Service: Cardiovascular;  Laterality: N/A;  ? EYE SURGERY    ? LACERATION REPAIR  2008  ? Dr. Lenon Curt -to hand  ? LEFT HEART CATH AND CORONARY ANGIOGRAPHY N/A 08/25/2018  ? Procedure: LEFT HEART CATH AND CORONARY ANGIOGRAPHY;  Surgeon: Sherren Mocha, MD;  Location: [place] [facility];  Service: Cardiovascular;  Laterality: N/A;  ? skin cancer removal    ? nose  ? SKIN FULL THICKNESS GRAFT Left 06/20/2020  ? Procedure: SKIN GRAFT FULL THICKNESS;  Surgeon: [name], MD;  Location: [place];  Service: Plastics;  Laterality: Left;  ? TIBIA FRACTURE SURGERY    ? left leg  ? ? ? ?Current Outpatient Medications  ?Medication Sig Dispense Refill  ? Accu-Chek FastClix Lancets MISC Check BS TID Dx E11.9 300 each 3  ? acetaminophen (TYLENOL) 325 MG tablet  Take 2 tablets (650 mg total) by mouth every 4 (four) hours as needed for mild pain or headache.    ? amLODipine (NORVASC) 5 MG tablet TAKE 1 TABLET EVERY DAY 90 tablet 0  ? aspirin EC 81 MG tablet Take 1 tablet (81 mg total) by mouth daily. 90 tablet 3  ? atorvastatin (LIPITOR) 80 MG tablet TAKE 1 TABLET EVERY DAY AT 6PM 90 tablet 0  ? Blood Glucose Monitoring Suppl (ACCU-CHEK AVIVA PLUS) w/Device KIT Check BS TID Dx E11.9 1 kit 0  ? Cholecalciferol 125 MCG (5000 UT) capsule Take 5,000 Units by mouth. Take one tablet three times weekly     ? CINNAMON PO Take by mouth 2 (two) times daily.     ? fluticasone (FLONASE) 50 MCG/ACT nasal spray Place 2 sprays into both nostrils daily as needed for allergies. 15.8 mL 11  ? Garlic  812 MG TABS Take by mouth 2 (two) times daily.     ? glucose blood (ACCU-CHEK AVIVA PLUS) test strip Check BS TID Dx E11.9 300 each 3  ? insulin degludec (TRESIBA FLEXTOUCH) 100 UNIT/ML FlexTouch Pen Inject 60 Units into the skin daily. 15 mL 3  ? irbesartan (AVAPRO) 150 MG tablet TAKE 1 TABLET EVERY DAY 90 tablet 0  ? levothyroxine (SYNTHROID) 75 MCG tablet TAKE 1 TABLET EVERY DAY 90 tablet 0  ? metFORMIN (GLUCOPHAGE) 1000 MG tablet TAKE 1 TABLET TWICE DAILY WITH MEALS 180 tablet 0  ? metoprolol tartrate (LOPRESSOR) 50 MG tablet TAKE 1/2 TABLET EVERY DAY 45 tablet 0  ? Semaglutide, 1 MG/DOSE, (OZEMPIC, 1 MG/DOSE,) 4 MG/3ML SOPN Inject 1 mg into the skin once a week. 9 mL 1  ? ?No current facility-administered medications for this visit.  ? ? ?Allergies:   Actos [pioglitazone hydrochloride], Lisinopril, Cymbalta [duloxetine hcl], Invokana [canagliflozin], Niaspan [niacin er], and Ramipril  ? ? ?ROS:  Please see the history of present illness.   Otherwise, review of systems are positive for none.   All other systems are reviewed and negative.  ? ? ?PHYSICAL EXAM: ?VS:  BP 120/78   Pulse 72   Ht '5\' 10"'  (1.778 m)   Wt 165 lb 12.8 oz (75.2 kg)   SpO2 98%   BMI 23.79 kg/m?  , BMI Body mass index is 23.79 kg/m?. ?GENERAL:  Well appearing ?NECK:  No jugular venous distention, waveform within normal limits, carotid upstroke brisk and symmetric, no bruits, no thyromegaly ?LUNGS:  Clear to auscultation bilaterally ?CHEST:  Unremarkable ?HEART:  PMI not displaced or sustained,S1 and S2 within normal limits, no S3, no S4, no clicks, no rubs, no murmurs ?ABD:  Flat, positive bowel sounds normal in frequency in pitch, no bruits, no rebound, no guarding, no midline pulsatile mass, no hepatomegaly, no splenomegaly ?EXT:  2 plus pulses throughout, no edema, no cyanosis no clubbing ? ? ?EKG:  EKG is  ordered today. ?Normal sinus rhythm, rate 72, axis within normal limits, intervals within normal limits, poor anterior R wave  progression, possible old anteroseptal infarct. ? ?Recent Labs: ?06/08/2021: ALT 26; BUN 11; Creatinine, Ser 0.87; Hemoglobin 14.1; Platelets 232; Potassium 4.4; Sodium 138; TSH 2.280  ? ? ?Lipid Panel ?   ?Component Value Date/Time  ? CHOL 70 (L) 06/08/2021 0800  ? CHOL 128 10/16/2012 1545  ? TRIG 57 06/08/2021 0800  ? TRIG 52 03/18/2016 1126  ? TRIG 102 10/16/2012 1545  ? HDL 37 (L) 06/08/2021 0800  ? HDL 53 03/18/2016 1126  ? HDL 45 10/16/2012  1545  ? CHOLHDL 1.9 06/08/2021 0800  ? LDLCALC 19 06/08/2021 0800  ? [place] 66 12/13/2013 0842  ? [place] 63 10/16/2012 1545  ? ?  ? ?Wt Readings from Last 3 Encounters:  ?08/26/21 165 lb 12.8 oz (75.2 kg)  ?06/08/21 165 lb 9.6 oz (75.1 kg)  ?03/05/21 164 lb (74.4 kg)  ?  ? ? ?Other studies Reviewed: ?Additional studies/ records that were reviewed today include:  Labs ?Review of the above records demonstrates:  Please see elsewhere in the note.   ? ? ?ASSESSMENT AND PLAN: ? ?CAD ?The patient has no new sypmtoms.  No further cardiovascular testing is indicated.  We will continue with aggressive risk reduction and meds as listed.rt this afterwards. ? ?HTN ?BP is controlled.  No change in therapy.  ? ?HLD ?LDL was 19 with an HDL of 37.  He will continue the meds as listed.   ? ?Type 2 DM ?A1c was up to 8.1.  He just had his semaglutide increased.  ? ? ?Current medicines are reviewed at length with the patient today.  The patient does not have concerns regarding medicines. ? ?The following changes have been made:  None ? ?Labs/ tests ordered today include: None ? ?Orders Placed This Encounter  ?Procedures  ? EKG 12-Lead  ? ? ? ?Disposition:  Follow up with me in 12 months.  ? ? ?Signed, ?Minus Breeding, MD  ?08/26/2021 3:36 PM    ?[place] ?

## 2021-08-26 ENCOUNTER — Ambulatory Visit (INDEPENDENT_AMBULATORY_CARE_PROVIDER_SITE_OTHER): Payer: Medicare HMO | Admitting: Cardiology

## 2021-08-26 ENCOUNTER — Encounter: Payer: Self-pay | Admitting: Cardiology

## 2021-08-26 VITALS — BP 120/78 | HR 72 | Ht 70.0 in | Wt 165.8 lb

## 2021-08-26 DIAGNOSIS — I2510 Atherosclerotic heart disease of native coronary artery without angina pectoris: Secondary | ICD-10-CM

## 2021-08-26 DIAGNOSIS — I10 Essential (primary) hypertension: Secondary | ICD-10-CM

## 2021-08-26 NOTE — Patient Instructions (Signed)

## 2021-09-07 ENCOUNTER — Encounter: Payer: Self-pay | Admitting: Family Medicine

## 2021-09-07 ENCOUNTER — Ambulatory Visit (INDEPENDENT_AMBULATORY_CARE_PROVIDER_SITE_OTHER): Payer: Medicare HMO | Admitting: Family Medicine

## 2021-09-07 VITALS — BP 130/70 | HR 61 | Temp 97.2°F | Ht 70.0 in | Wt 164.4 lb

## 2021-09-07 DIAGNOSIS — E782 Mixed hyperlipidemia: Secondary | ICD-10-CM | POA: Diagnosis not present

## 2021-09-07 DIAGNOSIS — Z794 Long term (current) use of insulin: Secondary | ICD-10-CM

## 2021-09-07 DIAGNOSIS — Z23 Encounter for immunization: Secondary | ICD-10-CM

## 2021-09-07 DIAGNOSIS — I10 Essential (primary) hypertension: Secondary | ICD-10-CM | POA: Diagnosis not present

## 2021-09-07 DIAGNOSIS — E1169 Type 2 diabetes mellitus with other specified complication: Secondary | ICD-10-CM

## 2021-09-07 DIAGNOSIS — E119 Type 2 diabetes mellitus without complications: Secondary | ICD-10-CM

## 2021-09-07 DIAGNOSIS — E034 Atrophy of thyroid (acquired): Secondary | ICD-10-CM

## 2021-09-07 LAB — BAYER DCA HB A1C WAIVED: HB A1C (BAYER DCA - WAIVED): 7.4 % — ABNORMAL HIGH (ref 4.8–5.6)

## 2021-09-07 MED ORDER — METFORMIN HCL 1000 MG PO TABS: 1000.0000 mg | ORAL_TABLET | Freq: Two times a day (BID) | ORAL | 2 refills | Status: DC

## 2021-09-07 MED ORDER — LEVOTHYROXINE SODIUM 75 MCG PO TABS: 75.0000 ug | ORAL_TABLET | Freq: Every day | ORAL | 2 refills | Status: DC

## 2021-09-07 MED ORDER — AMLODIPINE BESYLATE 5 MG PO TABS: 5.0000 mg | ORAL_TABLET | Freq: Every day | ORAL | 2 refills | Status: DC

## 2021-09-07 MED ORDER — METOPROLOL TARTRATE 50 MG PO TABS: 25.0000 mg | ORAL_TABLET | Freq: Every day | ORAL | 2 refills | Status: DC

## 2021-09-07 MED ORDER — ATORVASTATIN CALCIUM 80 MG PO TABS: 80.0000 mg | ORAL_TABLET | Freq: Every day | ORAL | 2 refills | Status: DC

## 2021-09-07 MED ORDER — IRBESARTAN 150 MG PO TABS: 150.0000 mg | ORAL_TABLET | Freq: Every day | ORAL | 2 refills | Status: DC

## 2021-09-07 NOTE — Progress Notes (Signed)
Subjective:  Patient ID: [name] [name],  male    DOB: 1940-09-30  Age: 81 y.o.    CC: Medical Management of Chronic Issues   HPI [name] [name] presents for  follow-up of hypertension. Patient has no history of headache chest pain or shortness of breath or recent cough. Patient also denies symptoms of TIA such as numbness weakness lateralizing. Patient denies side effects from medication. States taking it regularly.   follow-up on  thyroid. The patient has a history of hypothyroidism for many years. It has been stable recently. Pt. denies any change in  voice, loss of hair, heat or cold intolerance. Energy level has been adequate to good. Patient denies constipation and diarrhea. No myxedema. Medication is as noted below. Verified that pt is taking it daily on an empty stomach. Well tolerated.   Patient also  in for follow-up of elevated cholesterol. Doing well without complaints on current medication. Denies side effects  including myalgia and arthralgia and nausea. Also in today for liver function testing. Currently no chest pain, shortness of breath or other cardiovascular related symptoms noted.  Follow-up of diabetes. Patient does check blood sugar at home. Readings run between 80-100 fasting and 130-140 prandial Patient denies symptoms such as excessive hunger or urinary frequency, excessive hunger, nausea No significant hypoglycemic spells noted. Occasioally when active he feels sugar dropping a bit and goes in to eat something. Medications reviewed. Pt reports taking them regularly. Pt. denies complication/adverse reaction today.    History [name] [name] has a past medical history of Basal cell carcinoma, CAD (coronary artery disease), Cardiac arrest ([place]), Cataract, Diabetes mellitus without complication ([place]), Diverticulitis, Erectile dysfunction, Gastroesophageal reflux disease with hiatal hernia, Goiter, nontoxic, multinodular, Hiatal hernia, Hyperlipidemia, Inguinal hernia, Kidney  stone (1980), Leg fracture, Liver hemangioma, Nephrolithiasis, and Vitamin D deficiency.   He has a past surgical history that includes LEFT HEART CATH AND CORONARY ANGIOGRAPHY (N/A, 08/25/2018); CORONARY STENT INTERVENTION (N/A, 08/25/2018); Laceration repair (2008); Back surgery (1984); Tibia fracture surgery; Cataract extraction (Bilateral); Eye surgery; Adjacent tissue transfer/tissue rearrangement (Left, 06/20/2020); Full thickness skin graft (Left, 06/20/2020); and skin cancer removal.   His family history includes Cancer in his father; Diabetes in an other family member; Early death (age of onset: 40) in his brother; Heart attack in his sister; Heart disease in his brother; Heart disease (age of onset: 69) in his mother.He reports that he quit smoking about 38 years ago. His smoking use included cigarettes. He has a 27.00 pack-year smoking history. He has never used smokeless tobacco. He reports that he does not drink alcohol and does not use drugs.  Current Outpatient Medications on File Prior to Visit  Medication Sig Dispense Refill   Accu-Chek FastClix Lancets MISC Check BS TID Dx E11.9 300 each 3   acetaminophen (TYLENOL) 325 MG tablet Take 2 tablets (650 mg total) by mouth every 4 (four) hours as needed for mild pain or headache.     aspirin EC 81 MG tablet Take 1 tablet (81 mg total) by mouth daily. 90 tablet 3   Blood Glucose Monitoring Suppl (ACCU-CHEK AVIVA PLUS) w/Device KIT Check BS TID Dx E11.9 1 kit 0   Cholecalciferol 125 MCG (5000 UT) capsule Take 5,000 Units by mouth. Take one tablet three times weekly      CINNAMON PO Take by mouth 2 (two) times daily.      fluticasone (FLONASE) 50 MCG/ACT nasal spray Place 2 sprays into both nostrils daily as needed for allergies. 15.8 mL  11   Garlic 537 MG TABS Take by mouth 2 (two) times daily.      glucose blood (ACCU-CHEK AVIVA PLUS) test strip Check BS TID Dx E11.9 300 each 3   insulin degludec (TRESIBA FLEXTOUCH) 100 UNIT/ML FlexTouch Pen  Inject 60 Units into the skin daily. 15 mL 3   Semaglutide, 1 MG/DOSE, (OZEMPIC, 1 MG/DOSE,) 4 MG/3ML SOPN Inject 1 mg into the skin once a week. 9 mL 1   No current facility-administered medications on file prior to visit.    ROS Review of Systems  Constitutional:  Negative for fever.  Respiratory:  Negative for shortness of breath.   Cardiovascular:  Negative for chest pain.  Musculoskeletal:  Negative for arthralgias.  Skin:  Negative for rash.   Objective:  BP 130/70   Pulse 61   Temp (!) 97.2 F (36.2 C)   Ht _0  (1.778 m)   Wt 164 lb 6.4 oz (74.6 kg)   SpO2 98%   BMI 23.59 kg/m   BP Readings from Last 3 Encounters:  09/07/21 130/70  08/26/21 120/78  06/08/21 115/71    Wt Readings from Last 3 Encounters:  09/07/21 164 lb 6.4 oz (74.6 kg)  08/26/21 165 lb 12.8 oz (75.2 kg)  06/08/21 165 lb 9.6 oz (75.1 kg)     Physical Exam Vitals reviewed.  Constitutional:      Appearance: He is well-developed.  HENT:     Head: Normocephalic and atraumatic.     Right Ear: External ear normal.     Left Ear: External ear normal.     Mouth/Throat:     Pharynx: No oropharyngeal exudate or posterior oropharyngeal erythema.  Eyes:     Pupils: Pupils are equal, round, and reactive to light.  Cardiovascular:     Rate and Rhythm: Normal rate and regular rhythm.     Heart sounds: No murmur heard. Pulmonary:     Effort: No respiratory distress.     Breath sounds: Normal breath sounds.  Musculoskeletal:     Cervical back: Normal range of motion and neck supple.  Neurological:     Mental Status: He is alert and oriented to person, place, and time.    Diabetic Foot Exam - Simple   No data filed     Lab Results  Component Value Date   HGBA1C 8.1 (H) 06/08/2021   HGBA1C 7.8 (H) 03/05/2021   HGBA1C 7.5 (H) 12/02/2020    Assessment & Plan:   [name] [name] was seen today for medical management of chronic issues.  Diagnoses and all orders for this visit:  Type 2 diabetes  mellitus with other specified complication, with long-term current use of insulin ([place]) -     Bayer DCA Hb A1c Waived  Essential hypertension -     CBC with Differential/Platelet -     CMP14+EGFR -     metoprolol tartrate (LOPRESSOR) 50 MG tablet; Take 0.5 tablets (25 mg total) by mouth daily. For blood pressure and heart -     irbesartan (AVAPRO) 150 MG tablet; Take 1 tablet (150 mg total) by mouth daily. For blood pressure  Mixed hyperlipidemia -     Lipid panel  Hypothyroidism due to acquired atrophy of thyroid -     TSH + free T4  Type 2 diabetes mellitus treated with insulin ([facility]) -     irbesartan (AVAPRO) 150 MG tablet; Take 1 tablet (150 mg total) by mouth daily. For blood pressure -     metFORMIN (GLUCOPHAGE) 1000  MG tablet; Take 1 tablet (1,000 mg total) by mouth 2 (two) times daily with a meal. For diabetes  Other orders -     amLODipine (NORVASC) 5 MG tablet; Take 1 tablet (5 mg total) by mouth daily. For blood pressure -     atorvastatin (LIPITOR) 80 MG tablet; Take 1 tablet (80 mg total) by mouth daily. For cholesterol -     levothyroxine (SYNTHROID) 75 MCG tablet; Take 1 tablet (75 mcg total) by mouth daily. For thyroid   I have changed Gunnar Fusi. Kwiatek's metoprolol tartrate, amLODipine, atorvastatin, irbesartan, levothyroxine, and metFORMIN. I am also having him maintain his acetaminophen, Cholecalciferol, Garlic, Accu-Chek Aviva Plus, Accu-Chek Aviva Plus, Accu-Chek FastClix Lancets, CINNAMON PO, aspirin EC, fluticasone, Ozempic (1 MG/DOSE), and Tresiba FlexTouch.  Meds ordered this encounter  Medications   metoprolol tartrate (LOPRESSOR) 50 MG tablet    Sig: Take 0.5 tablets (25 mg total) by mouth daily. For blood pressure and heart    Dispense:  45 tablet    Refill:  2   amLODipine (NORVASC) 5 MG tablet    Sig: Take 1 tablet (5 mg total) by mouth daily. For blood pressure    Dispense:  90 tablet    Refill:  2   atorvastatin (LIPITOR) 80 MG tablet    Sig: Take 1  tablet (80 mg total) by mouth daily. For cholesterol    Dispense:  90 tablet    Refill:  2   irbesartan (AVAPRO) 150 MG tablet    Sig: Take 1 tablet (150 mg total) by mouth daily. For blood pressure    Dispense:  90 tablet    Refill:  2   levothyroxine (SYNTHROID) 75 MCG tablet    Sig: Take 1 tablet (75 mcg total) by mouth daily. For thyroid    Dispense:  90 tablet    Refill:  2   metFORMIN (GLUCOPHAGE) 1000 MG tablet    Sig: Take 1 tablet (1,000 mg total) by mouth 2 (two) times daily with a meal. For diabetes    Dispense:  180 tablet    Refill:  2     Follow-up: Return in about 3 months (around 12/08/2021).  Claretta Fraise, M.D.

## 2021-09-07 NOTE — Addendum Note (Signed)
Addended by: Christia Reading on: 09/07/2021 09:25 AM   Modules accepted: Orders

## 2021-09-08 LAB — CBC WITH DIFFERENTIAL/PLATELET
Basophils Absolute: 0.1 10*3/uL (ref 0.0–0.2)
Basos: 1 %
EOS (ABSOLUTE): 0.1 10*3/uL (ref 0.0–0.4)
Eos: 2 %
Hematocrit: 42.7 % (ref 37.5–51.0)
Hemoglobin: 14.1 g/dL (ref 13.0–17.7)
Immature Grans (Abs): 0.1 10*3/uL (ref 0.0–0.1)
Immature Granulocytes: 1 %
Lymphocytes Absolute: 1.6 10*3/uL (ref 0.7–3.1)
Lymphs: 23 %
MCH: 27.5 pg (ref 26.6–33.0)
MCHC: 33 g/dL (ref 31.5–35.7)
MCV: 83 fL (ref 79–97)
Monocytes Absolute: 0.6 10*3/uL (ref 0.1–0.9)
Monocytes: 9 %
Neutrophils Absolute: 4.7 10*3/uL (ref 1.4–7.0)
Neutrophils: 64 %
Platelets: 230 10*3/uL (ref 150–450)
RBC: 5.13 x10E6/uL (ref 4.14–5.80)
RDW: 13.1 % (ref 11.6–15.4)
WBC: 7.2 10*3/uL (ref 3.4–10.8)

## 2021-09-08 LAB — CMP14+EGFR
ALT: 22 IU/L (ref 0–44)
AST: 20 IU/L (ref 0–40)
Albumin/Globulin Ratio: 1.8 (ref 1.2–2.2)
Albumin: 4.3 g/dL (ref 3.7–4.7)
Alkaline Phosphatase: 91 IU/L (ref 44–121)
BUN/Creatinine Ratio: 14 (ref 10–24)
BUN: 12 mg/dL (ref 8–27)
Bilirubin Total: 0.3 mg/dL (ref 0.0–1.2)
CO2: 23 mmol/L (ref 20–29)
Calcium: 9.8 mg/dL (ref 8.6–10.2)
Chloride: 104 mmol/L (ref 96–106)
Creatinine, Ser: 0.84 mg/dL (ref 0.76–1.27)
Globulin, Total: 2.4 g/dL (ref 1.5–4.5)
Glucose: 123 mg/dL — ABNORMAL HIGH (ref 70–99)
Potassium: 4.7 mmol/L (ref 3.5–5.2)
Sodium: 141 mmol/L (ref 134–144)
Total Protein: 6.7 g/dL (ref 6.0–8.5)
eGFR: 88 mL/min/{1.73_m2} (ref >59)

## 2021-09-08 LAB — LIPID PANEL
Chol/HDL Ratio: 2.1 ratio (ref 0.0–5.0)
Cholesterol, Total: 68 mg/dL — ABNORMAL LOW (ref 100–199)
HDL: 32 mg/dL — ABNORMAL LOW (ref >39)
LDL Chol Calc (NIH): 23 mg/dL (ref 0–99)
Triglycerides: 49 mg/dL (ref 0–149)
VLDL Cholesterol Cal: 13 mg/dL (ref 5–40)

## 2021-09-08 LAB — TSH+FREE T4
Free T4: 1.58 ng/dL (ref 0.82–1.77)
TSH: 2.29 u[IU]/mL (ref 0.450–4.500)

## 2021-09-08 NOTE — Progress Notes (Signed)
Hello [place],  Your lab result is normal and/or stable.Some minor variations that are not significant are commonly marked abnormal, but do not represent any medical problem for you.  Best regards, Sheamus Hasting, M.D.

## 2021-10-15 ENCOUNTER — Ambulatory Visit (INDEPENDENT_AMBULATORY_CARE_PROVIDER_SITE_OTHER): Payer: Medicare HMO

## 2021-10-15 VITALS — Wt 164.0 lb

## 2021-10-15 DIAGNOSIS — Z0000 Encounter for general adult medical examination without abnormal findings: Secondary | ICD-10-CM

## 2021-10-15 NOTE — Progress Notes (Signed)
Subjective:   [name] [name] is a 81 y.o. male who presents for Medicare Annual/Subsequent preventive examination.  Virtual Visit via Telephone Note  I connected with  [name] [name] on 10/15/21 at 11:15 AM EDT by telephone and verified that I am speaking with the correct person using two identifiers.  Location: Patient: Home Provider: [facility] Persons participating in the virtual visit: patient/Nurse Health Advisor   I discussed the limitations, risks, security and privacy concerns of performing an evaluation and management service by telephone and the availability of in person appointments. The patient expressed understanding and agreed to proceed.  Interactive audio and video telecommunications were attempted between this nurse and patient, however failed, due to patient having technical difficulties OR patient did not have access to video capability.  We continued and completed visit with audio only.  Some vital signs may be absent or patient reported.   [name] Morath E Ron Junco, LPN   Review of Systems     Cardiac Risk Factors include: advanced age (>20mn, >>31women);diabetes mellitus;dyslipidemia;hypertension;Other (see comment), Risk factor comments: CAD, A.FIB, CHF, peripheral vascular insufficiency     Objective:    Today's Vitals   10/15/21 1119  Weight: 164 lb (74.4 kg)   Body mass index is 23.53 kg/m.     10/15/2021   11:27 AM 10/14/2020    1:16 PM 06/20/2020    8:28 AM 10/04/2019    9:57 AM 09/20/2018    3:48 PM 08/30/2018   11:56 PM 08/26/2018    6:00 PM  Advanced Directives  Does Patient Have a Medical Advance Directive? Yes No No No Yes No No  Type of [place]    [place]    Does patient want to make changes to medical advance directive?     No - Patient declined    Copy of [place] Chart? No - copy requested    No - copy requested    Would patient like information on  creating a medical advance directive?  No - Patient declined No - Patient declined No - Patient declined No - Patient declined No - Patient declined No - Patient declined;No - Guardian declined    Current Medications (verified) Outpatient Encounter Medications as of 10/15/2021  Medication Sig   Accu-Chek FastClix Lancets MISC Check BS TID Dx E11.9   acetaminophen (TYLENOL) 325 MG tablet Take 2 tablets (650 mg total) by mouth every 4 (four) hours as needed for mild pain or headache.   amLODipine (NORVASC) 5 MG tablet Take 1 tablet (5 mg total) by mouth daily. For blood pressure   aspirin EC 81 MG tablet Take 1 tablet (81 mg total) by mouth daily.   atorvastatin (LIPITOR) 80 MG tablet Take 1 tablet (80 mg total) by mouth daily. For cholesterol   Blood Glucose Monitoring Suppl (ACCU-CHEK AVIVA PLUS) w/Device KIT Check BS TID Dx E11.9   Cholecalciferol 125 MCG (5000 UT) capsule Take 5,000 Units by mouth. Take one tablet three times weekly    CINNAMON PO Take by mouth 2 (two) times daily.    fluticasone (FLONASE) 50 MCG/ACT nasal spray Place 2 sprays into both nostrils daily as needed for allergies.   Garlic 1563MG TABS Take by mouth 2 (two) times daily.    glucose blood (ACCU-CHEK AVIVA PLUS) test strip Check BS TID Dx E11.9   insulin degludec (TRESIBA FLEXTOUCH) 100 UNIT/ML FlexTouch Pen Inject 60 Units into the skin daily.  irbesartan (AVAPRO) 150 MG tablet Take 1 tablet (150 mg total) by mouth daily. For blood pressure   levothyroxine (SYNTHROID) 75 MCG tablet Take 1 tablet (75 mcg total) by mouth daily. For thyroid   metFORMIN (GLUCOPHAGE) 1000 MG tablet Take 1 tablet (1,000 mg total) by mouth 2 (two) times daily with a meal. For diabetes   metoprolol tartrate (LOPRESSOR) 50 MG tablet Take 0.5 tablets (25 mg total) by mouth daily. For blood pressure and heart   Semaglutide, 1 MG/DOSE, (OZEMPIC, 1 MG/DOSE,) 4 MG/3ML SOPN Inject 1 mg into the skin once a week.   [DISCONTINUED] clopidogrel  (PLAVIX) 75 MG tablet Take 75 mg by mouth daily.   No facility-administered encounter medications on file as of 10/15/2021.    Allergies (verified) Actos [pioglitazone hydrochloride], Lisinopril, Cymbalta [duloxetine hcl], Invokana [canagliflozin], Niaspan [niacin er], and Ramipril   History: Past Medical History:  Diagnosis Date   Basal cell carcinoma    left forehead, nose, neck   CAD (coronary artery disease)    a. s/p DES to Prox-LAD and DES to LCx in 08/2018   Cardiac arrest ([place])    Cataract    Diabetes mellitus without complication ([place])    Diverticulitis    Erectile dysfunction    Gastroesophageal reflux disease with hiatal hernia    Goiter, nontoxic, multinodular    with macrocyst   Hiatal hernia    With reflex   Hyperlipidemia    Inguinal hernia    right side   Kidney stone 1980   Leg fracture    Left   Liver hemangioma    Nephrolithiasis    Vitamin D deficiency    Past Surgical History:  Procedure Laterality Date   ADJACENT TISSUE TRANSFER/TISSUE REARRANGEMENT Left 06/20/2020   Procedure: Left nasal reconstruction with adjacent tissue transfer;  Surgeon: [name], MD;  Location: [facility];  Service: Plastics;  Laterality: Left;  90 min, please   BACK SURGERY  1984   CATARACT EXTRACTION Bilateral    CORONARY STENT INTERVENTION N/A 08/25/2018   Procedure: CORONARY STENT INTERVENTION;  Surgeon: Sherren Mocha, MD;  Location: [facility];  Service: Cardiovascular;  Laterality: N/A;   EYE SURGERY     LACERATION REPAIR  2008   Dr. Lenon Curt -to hand   LEFT HEART CATH AND CORONARY ANGIOGRAPHY N/A 08/25/2018   Procedure: LEFT HEART CATH AND CORONARY ANGIOGRAPHY;  Surgeon: Sherren Mocha, MD;  Location: [facility];  Service: Cardiovascular;  Laterality: N/A;   skin cancer removal     nose   SKIN FULL THICKNESS GRAFT Left 06/20/2020   Procedure: SKIN GRAFT FULL THICKNESS;  Surgeon: [name], MD;  Location: [facility];  Service: Plastics;  Laterality: Left;    TIBIA FRACTURE SURGERY     left leg   Family History  Problem Relation Age of Onset   Heart attack Sister        Sudden death in her 29s.  No clear as to the cause   Heart disease Brother        Valvular   Heart disease Mother 89       stent, lived to be 57   Cancer Father        ?lung   Early death Brother 3       spinal menigitis   Diabetes Other    Social History   Socioeconomic History   Marital status: Married    Spouse name: Dorian Pod   Number of children: 1  Years of education: Not on file   Highest education level: 11th grade  Occupational History   Occupation: Retired  Tobacco Use   Smoking status: Former    Packs/day: 1.00    Years: 27.00    Total pack years: 27.00    Types: Cigarettes    Quit date: 04/20/1983    Years since quitting: 38.5   Smokeless tobacco: Never   Tobacco comments:    Quit 25 years ago  Vaping Use   Vaping Use: Never used  Substance and Sexual Activity   Alcohol use: No   Drug use: No   Sexual activity: Yes  Other Topics Concern   Not on file  Social History Narrative   ** Merged History Encounter **       Married.  Lives with wife.  Grandson stays with them some.     Social Determinants of Health   Financial Resource Strain: Low Risk  (10/15/2021)   Overall Financial Resource Strain (CARDIA)    Difficulty of Paying Living Expenses: Not hard at all  Food Insecurity: No Food Insecurity (10/15/2021)   Hunger Vital Sign    Worried About Running Out of Food in the Last Year: Never true    Ran Out of Food in the Last Year: Never true  Transportation Needs: No Transportation Needs (10/15/2021)   PRAPARE - Hydrologist (Medical): No    Lack of Transportation (Non-Medical): No  Physical Activity: Insufficiently Active (10/15/2021)   Exercise Vital Sign    Days of Exercise per Week: 5 days    Minutes of Exercise per Session: 20 min  Stress: No Stress Concern Present (10/15/2021)   [place]    Feeling of Stress : Not at all  Social Connections: [place] (10/15/2021)   Social Connection and Isolation Panel [NHANES]    Frequency of Communication with Friends and Family: More than three times a week    Frequency of Social Gatherings with Friends and Family: More than three times a week    Attends Religious Services: More than 4 times per year    Active Member of [employer] or Organizations: Yes    Attends Music therapist: More than 4 times per year    Marital Status: Married    Tobacco Counseling Counseling given: Not Answered Tobacco comments: Quit 25 years ago   Clinical Intake:  Pre-visit preparation completed: Yes  Pain : No/denies pain     BMI - recorded: 23.53 Nutritional Status: BMI of 19-24  Normal Nutritional Risks: None Diabetes: Yes CBG done?: No Did pt. bring in CBG monitor from home?: No  How often do you need to have someone help you when you read instructions, pamphlets, or other written materials from your doctor or pharmacy?: 1 - Never  Diabetic?Nutrition Risk Assessment:  Has the patient had any N/V/D within the last 2 months?  No  Does the patient have any non-healing wounds?  No  Has the patient had any unintentional weight loss or weight gain?  No   Diabetes:  Is the patient diabetic?  Yes  If diabetic, was a CBG obtained today?  No  Did the patient bring in their glucometer from home?  No  How often do you monitor your CBG's? Every other day.   Financial Strains and Diabetes Management:  Are you having any financial strains with the device, your supplies or your medication?  He cannot afford, but says  Almyra Free has been getting it for him .  Does the patient want to be seen by Chronic Care Management for management of their diabetes?  No  Would the patient like to be referred to a Nutritionist or for Diabetic Management?  No   Diabetic Exams:  Diabetic Eye  Exam: Completed 2023 - records requested.  Diabetic Foot Exam: Completed 09/01/2020. Pt has been advised about the importance in completing this exam. Pt is scheduled for diabetic foot exam on 12/08/2021.    Interpreter Needed?: No  Information entered by :: Kyann Heydt, LPN   Activities of Daily Living    10/15/2021   11:27 AM  In your present state of health, do you have any difficulty performing the following activities:  Hearing? 0  Vision? 0  Difficulty concentrating or making decisions? 0  Walking or climbing stairs? 0  Dressing or bathing? 0  Doing errands, shopping? 0  Preparing Food and eating ? N  Using the Toilet? N  In the past six months, have you accidently leaked urine? N  Do you have problems with loss of bowel control? N  Managing your Medications? N  Managing your Finances? N  Housekeeping or managing your Housekeeping? N    Patient Care Team: Claretta Fraise, MD as PCP - General (Family Medicine) Minus Breeding, MD as PCP - Cardiology (Cardiology) Calvert Cantor, MD as Consulting Physician (Ophthalmology) Minus Breeding, MD as Consulting Physician (Cardiology) Lavera Guise, [facility] as Pharmacist (Family Medicine)  Indicate any recent Medical Services you may have received from other than [facility] providers in the past year (date may be approximate).     Assessment:   This is a routine wellness examination for [name] [name].  Hearing/Vision screen Hearing Screening - Comments:: Denies hearing difficulties   Vision Screening - Comments:: Wears rx glasses - up to date with routine eye exams with [facility]  Dietary issues and exercise activities discussed: Current Exercise Habits: Home exercise routine, Type of exercise: walking;Other - see comments (mowing, gardening, yard work, house work, Social research officer, government), Time (Minutes): 20, Frequency (Times/Week): 5, Weekly Exercise (Minutes/Week): 100, Intensity: Mild, Exercise limited by: cardiac condition(s)   Goals Addressed                This Visit's Progress     Exercise 3x per week (30 min per time)   On track     Stay active and healthy      COMPLETED: Prevent falls        Pt fell over 10 times last year due to a medication causing dizziness, problem solved now.      T2DM PHARMD GOAL (pt-stated)   On track     Current Barriers:  Unable to independently afford treatment regimen Unable to achieve control of T2DM  Suboptimal therapeutic regimen for T2DM  Pharmacist Clinical Goal(s):  Over the next 180 days, patient will verbalize ability to afford treatment regimen achieve control of T2DM as evidenced by IMPROVED GLYCEMIC CONTROL; GOAL A1C<7% through collaboration with PharmD and provider.    Interventions: 1:1 collaboration with Claretta Fraise, MD regarding development and update of comprehensive plan of care as evidenced by provider attestation and co-signature Inter-disciplinary care team collaboration (see longitudinal plan of care) Comprehensive medication review performed; medication list updated in electronic medical record  Diabetes: Uncontrolled, A1C 7.5-->8.1% (has been without meds--PAP finally shipped)  Current treatment: OZEMPIC 1MG SQ WEEKLY WEDNESDAYS, Tresiba 60 UNITS EACH MORNING, METFORMIN 1 G TWICE DAILY WITH MEALS-GFR 91;  NOTED SGTL2 INTOLERANCE TRANSITION LEVEMIR  TO TRESBIBA (ONCE PATIENT ASSISTANCE SHIPMENT ARRIVES)--WILL START TRESIBA 31 UNITS CONTINUE OZEMPIC TO 1MG SQ WEEKLY Denies personal and family history of Medullary thyroid cancer (MTC) TOLERATING WELL; DENIES SIDE EFFECTS AWAITING SHIPMENT FROM [facility] PAP-->re-enrollment sent for 2023 Current glucose readings: fasting glucose: 90-140, post prandial glucose: <190 Denies hypoglycemic/hyperglycemic symptoms Discussed meal planning options and Plate method for healthy eating Avoid sugary drinks and desserts Incorporate balanced protein, non starchy veggies, 1 serving of carbohydrate with each meal Increase water  intake Increase physical activity as able Current exercise: N/A Recommended NEW INSULIN, WILL WORK TO TITRATE DOSES Assessed patient finances. APPLICATION SUBMITTED TO [facility] PATIENT ASSISTANCE PROGRAM--OZEMPIC 1MG SQ WEEKLY, TRESIBA 55 UNITS DAILY, PEN NEEDLES; WILL LIKELY BE APPROVED AND SHIP TO PCP OFFICE IN 4 WEEKS  WILL UPDATE MEDICATION LIST ONCE MEDICATIONS ARRIVES AND PATIENT IS ACTUALLY TAKING DISCUSSED HYPERLIPIDEMIA/COMPLIANCE WITH MEDICATIONS   Patient Goals/Self-Care Activities Over the next 180 days, patient will:  - take medications as prescribed check glucose DAILY (FASTING) OR IF SYMPTOMATIC, document, and provide at future appointments collaborate with provider on medication access solutions  Follow Up Plan: Telephone follow up appointment with care management team member scheduled for: 3 months        Depression Screen    10/15/2021   11:24 AM 09/07/2021    8:04 AM 06/08/2021    7:58 AM 03/05/2021    8:00 AM 12/02/2020    8:08 AM 10/14/2020    1:07 PM 09/01/2020    8:15 AM  PHQ 2/9 Scores  PHQ - 2 Score 0 0 0 0 _0 PHQ- 9 Score    0 _1 Fall Risk    10/15/2021   11:20 AM 09/07/2021    8:04 AM 06/08/2021    7:58 AM 03/05/2021    7:59 AM 10/14/2020    1:25 PM  Fall Risk   Falls in the past year? 0 0 0 0 1  Number falls in past yr: 0    0  Injury with Fall? 0    0  Risk for fall due to : No Fall Risks    History of fall(s)  Follow up Falls prevention discussed   Falls evaluation completed Falls prevention discussed    FALL RISK PREVENTION PERTAINING TO THE HOME:  Any stairs in or around the home? Yes  If so, are there any without handrails? No  Home free of loose throw rugs in walkways, pet beds, electrical cords, etc? Yes  Adequate lighting in your home to reduce risk of falls? Yes   ASSISTIVE DEVICES UTILIZED TO PREVENT FALLS:  Life alert? No  Use of a cane, walker or w/c? No  Grab bars in the bathroom? No  Shower chair or bench in  shower? No  Elevated toilet seat or a handicapped toilet? No   TIMED UP AND GO:  Was the test performed? No . Telephonic visit  Cognitive Function:    12/01/2015    9:06 AM 11/04/2014   10:17 AM  MMSE - Mini Mental State Exam  Orientation to time 5 5  Orientation to Place 5 5  Registration 3 3  Attention/ Calculation 5 5  Recall 2 3  Language- name 2 objects 2 2  Language- repeat 1 1  Language- follow 3 step command 3 3  Language- read & follow direction 1 1  Write a sentence 1 1  Copy design 1 1  Total score 29 30  10/15/2021   11:30 AM 10/14/2020    1:13 PM 10/04/2019    9:48 AM 09/20/2018    3:50 PM  6CIT Screen  What Year? 0 points 0 points 0 points 0 points  What month? 0 points 0 points 0 points 0 points  What time? 0 points 0 points 0 points 0 points  Count back from 20 0 points 0 points 2 points 0 points  Months in reverse 2 points 0 points 2 points 0 points  Repeat phrase 4 points 0 points 0 points 0 points  Total Score 6 points 0 points 4 points 0 points    Immunizations Immunization History  Administered Date(s) Administered   Fluad Quad(high Dose 65+) 02/20/2019, 02/13/2020, 03/05/2021   Influenza Whole 01/17/2010   Influenza, High Dose Seasonal PF 01/31/2015, 02/18/2016, 02/16/2017, 01/25/2018   Influenza,inj,Quad PF,6+ Mos 02/06/2014   Influenza-Unspecified 02/17/2013   Moderna SARS-COV2 Booster Vaccination 03/26/2020   Moderna Sars-Covid-2 Vaccination 05/29/2019, 06/26/2019   Pneumococcal Conjugate-13 04/02/2013   Pneumococcal Polysaccharide-23 02/18/2008   Td 12/19/2006, 02/08/2011, 04/28/2017   Tdap 02/08/2011   Zoster Recombinat (Shingrix) 06/08/2021, 09/07/2021   Zoster, Live 10/28/2010    TDAP status: Up to date  Flu Vaccine status: Up to date  Pneumococcal vaccine status: Up to date  Covid-19 vaccine status: Completed vaccines  Qualifies for Shingles Vaccine? Yes   Zostavax completed Yes   Shingrix Completed?: Yes  Screening  Tests Health Maintenance  Topic Date Due   OPHTHALMOLOGY EXAM  02/24/2019   COVID-19 Vaccine (3 - Moderna risk series) 04/23/2020   FOOT EXAM  09/01/2021   INFLUENZA VACCINE  11/17/2021   HEMOGLOBIN A1C  03/10/2022   TETANUS/TDAP  04/29/2027   Pneumonia Vaccine 41+ Years old  Completed   Zoster Vaccines- Shingrix  Completed   HPV VACCINES  Aged Out    Health Maintenance  Health Maintenance Due  Topic Date Due   OPHTHALMOLOGY EXAM  02/24/2019   COVID-19 Vaccine (3 - Moderna risk series) 04/23/2020   FOOT EXAM  09/01/2021    Colorectal cancer screening: No longer required.   Lung Cancer Screening: (Low Dose CT Chest recommended if Age 2-80 years, 30 pack-year currently smoking OR have quit w/in 15years.) does not qualify.  Additional Screening:  Hepatitis C Screening: does not qualify  Vision Screening: Recommended annual ophthalmology exams for early detection of glaucoma and other disorders of the eye. Is the patient up to date with their annual eye exam?  Yes  Who is the provider or what is the name of the office in which the patient attends annual eye exams? [facility] If pt is not established with a provider, would they like to be referred to a provider to establish care? No .   Dental Screening: Recommended annual dental exams for proper oral hygiene  Community Resource Referral / Chronic Care Management: CRR required this visit?  No   CCM required this visit?  No      Plan:     I have personally reviewed and noted the following in the patient's chart:   Medical and social history Use of alcohol, tobacco or illicit drugs  Current medications and supplements including opioid prescriptions. Patient is not currently taking opioid prescriptions. Functional ability and status Nutritional status Physical activity Advanced directives List of other physicians Hospitalizations, surgeries, and ER visits in previous 12 months Vitals Screenings to include cognitive,  depression, and falls Referrals and appointments  In addition, I have reviewed and discussed with patient certain preventive protocols,  quality metrics, and best practice recommendations. A written personalized care plan for preventive services as well as general preventive health recommendations were provided to patient.     Sandrea Hammond, LPN   [id number]   Nurse Notes: None

## 2021-10-15 NOTE — Patient Instructions (Signed)
[name] [name] , Thank you for taking time to come for your Medicare Wellness Visit. I appreciate your ongoing commitment to your health goals. Please review the following plan we discussed and let me know if I can assist you in the future.   Screening recommendations/referrals: Colonoscopy: no longer required Recommended yearly ophthalmology/optometry visit for glaucoma screening and checkup Recommended yearly dental visit for hygiene and checkup  Vaccinations: Influenza vaccine: Done 03/05/2021 - Repeat annually Pneumococcal vaccine: Done  02/18/2008 & 04/02/2013   Tdap vaccine: Done 04/28/2017 - Repeat in 10 years Shingles vaccine: Done  06/08/2021 & 09/07/2021   Covid-19: Done 05/29/2019, 06/26/2019, 03/26/2020  Advanced directives: Please bring a copy of your health care power of attorney and living will to the office to be added to your chart at your convenience.   Conditions/risks identified: Aim for 30 minutes of exercise or brisk walking, 6-8 glasses of water, and 5 servings of fruits and vegetables each day.   Next appointment: Follow up in one year for your annual wellness visit.   Preventive Care 81 Years and Older, Male  Preventive care refers to lifestyle choices and visits with your health care provider that can promote health and wellness. What does preventive care include? A yearly physical exam. This is also called an annual well check. Dental exams once or twice a year. Routine eye exams. Ask your health care provider how often you should have your eyes checked. Personal lifestyle choices, including: Daily care of your teeth and gums. Regular physical activity. Eating a healthy diet. Avoiding tobacco and drug use. Limiting alcohol use. Practicing safe sex. Taking low doses of aspirin every day. Taking vitamin and mineral supplements as recommended by your health care provider. What happens during an annual well check? The services and screenings done by your health care  provider during your annual well check will depend on your age, overall health, lifestyle risk factors, and family history of disease. Counseling  Your health care provider may ask you questions about your: Alcohol use. Tobacco use. Drug use. Emotional well-being. Home and relationship well-being. Sexual activity. Eating habits. History of falls. Memory and ability to understand (cognition). Work and work Statistician. Screening  You may have the following tests or measurements: Height, weight, and BMI. Blood pressure. Lipid and cholesterol levels. These may be checked every 5 years, or more frequently if you are over 75 years old. Skin check. Lung cancer screening. You may have this screening every year starting at age 43 if you have a 30-pack-year history of smoking and currently smoke or have quit within the past 15 years. Fecal occult blood test (FOBT) of the stool. You may have this test every year starting at age 40. Flexible sigmoidoscopy or colonoscopy. You may have a sigmoidoscopy every 5 years or a colonoscopy every 10 years starting at age 56. Prostate cancer screening. Recommendations will vary depending on your family history and other risks. Hepatitis C blood test. Hepatitis B blood test. Sexually transmitted disease (STD) testing. Diabetes screening. This is done by checking your blood sugar (glucose) after you have not eaten for a while (fasting). You may have this done every 1-3 years. Abdominal aortic aneurysm (AAA) screening. You may need this if you are a current or former smoker. Osteoporosis. You may be screened starting at age 43 if you are at high risk. Talk with your health care provider about your test results, treatment options, and if necessary, the need for more tests. Vaccines  Your health care provider  may recommend certain vaccines, such as: Influenza vaccine. This is recommended every year. Tetanus, diphtheria, and acellular pertussis (Tdap, Td)  vaccine. You may need a Td booster every 10 years. Zoster vaccine. You may need this after age 68. Pneumococcal 13-valent conjugate (PCV13) vaccine. One dose is recommended after age 53. Pneumococcal polysaccharide (PPSV23) vaccine. One dose is recommended after age 41. Talk to your health care provider about which screenings and vaccines you need and how often you need them. This information is not intended to replace advice given to you by your health care provider. Make sure you discuss any questions you have with your health care provider. Document Released: 05/02/2015 Document Revised: 12/24/2015 Document Reviewed: 02/04/2015 Elsevier Interactive Patient Education  2017 [place] Prevention in the Home Falls can cause injuries. They can happen to people of all ages. There are many things you can do to make your home safe and to help prevent falls. What can I do on the outside of my home? Regularly fix the edges of walkways and driveways and fix any cracks. Remove anything that might make you trip as you walk through a door, such as a raised step or threshold. Trim any bushes or trees on the path to your home. Use bright outdoor lighting. Clear any walking paths of anything that might make someone trip, such as rocks or tools. Regularly check to see if handrails are loose or broken. Make sure that both sides of any steps have handrails. Any raised decks and porches should have guardrails on the edges. Have any leaves, snow, or ice cleared regularly. Use sand or salt on walking paths during winter. Clean up any spills in your garage right away. This includes oil or grease spills. What can I do in the bathroom? Use night lights. Install grab bars by the toilet and in the tub and shower. Do not use towel bars as grab bars. Use non-skid mats or decals in the tub or shower. If you need to sit down in the shower, use a plastic, non-slip stool. Keep the floor dry. Clean up any  water that spills on the floor as soon as it happens. Remove soap buildup in the tub or shower regularly. Attach bath mats securely with double-sided non-slip rug tape. Do not have throw rugs and other things on the floor that can make you trip. What can I do in the bedroom? Use night lights. Make sure that you have a light by your bed that is easy to reach. Do not use any sheets or blankets that are too big for your bed. They should not hang down onto the floor. Have a firm chair that has side arms. You can use this for support while you get dressed. Do not have throw rugs and other things on the floor that can make you trip. What can I do in the kitchen? Clean up any spills right away. Avoid walking on wet floors. Keep items that you use a lot in easy-to-reach places. If you need to reach something above you, use a strong step stool that has a grab bar. Keep electrical cords out of the way. Do not use floor polish or wax that makes floors slippery. If you must use wax, use non-skid floor wax. Do not have throw rugs and other things on the floor that can make you trip. What can I do with my stairs? Do not leave any items on the stairs. Make sure that there are handrails on both sides  of the stairs and use them. Fix handrails that are broken or loose. Make sure that handrails are as long as the stairways. Check any carpeting to make sure that it is firmly attached to the stairs. Fix any carpet that is loose or worn. Avoid having throw rugs at the top or bottom of the stairs. If you do have throw rugs, attach them to the floor with carpet tape. Make sure that you have a light switch at the top of the stairs and the bottom of the stairs. If you do not have them, ask someone to add them for you. What else can I do to help prevent falls? Wear shoes that: Do not have high heels. Have rubber bottoms. Are comfortable and fit you well. Are closed at the toe. Do not wear sandals. If you use a  stepladder: Make sure that it is fully opened. Do not climb a closed stepladder. Make sure that both sides of the stepladder are locked into place. Ask someone to hold it for you, if possible. Clearly mark and make sure that you can see: Any grab bars or handrails. First and last steps. Where the edge of each step is. Use tools that help you move around (mobility aids) if they are needed. These include: Canes. Walkers. Scooters. Crutches. Turn on the lights when you go into a dark area. Replace any light bulbs as soon as they burn out. Set up your furniture so you have a clear path. Avoid moving your furniture around. If any of your floors are uneven, fix them. If there are any pets around you, be aware of where they are. Review your medicines with your doctor. Some medicines can make you feel dizzy. This can increase your chance of falling. Ask your doctor what other things that you can do to help prevent falls. This information is not intended to replace advice given to you by your health care provider. Make sure you discuss any questions you have with your health care provider. Document Released: 01/30/2009 Document Revised: 09/11/2015 Document Reviewed: 05/10/2014 Elsevier Interactive Patient Education  2017 [employer].

## 2021-12-04 ENCOUNTER — Telehealth: Payer: Self-pay

## 2021-12-04 NOTE — Telephone Encounter (Signed)
Patient informed that five boxes of Tresiba and four boxes of Ozempic have arrived and will be placed in refrigerator for him to pick up.

## 2021-12-08 ENCOUNTER — Ambulatory Visit (INDEPENDENT_AMBULATORY_CARE_PROVIDER_SITE_OTHER): Payer: Medicare HMO | Admitting: Family Medicine

## 2021-12-08 ENCOUNTER — Encounter: Payer: Self-pay | Admitting: Family Medicine

## 2021-12-08 VITALS — BP 128/65 | HR 66 | Temp 97.2°F | Ht 70.0 in | Wt 161.8 lb

## 2021-12-08 DIAGNOSIS — Z794 Long term (current) use of insulin: Secondary | ICD-10-CM

## 2021-12-08 DIAGNOSIS — E034 Atrophy of thyroid (acquired): Secondary | ICD-10-CM

## 2021-12-08 DIAGNOSIS — E782 Mixed hyperlipidemia: Secondary | ICD-10-CM | POA: Diagnosis not present

## 2021-12-08 DIAGNOSIS — E1169 Type 2 diabetes mellitus with other specified complication: Secondary | ICD-10-CM

## 2021-12-08 DIAGNOSIS — I10 Essential (primary) hypertension: Secondary | ICD-10-CM | POA: Diagnosis not present

## 2021-12-08 LAB — BAYER DCA HB A1C WAIVED: HB A1C (BAYER DCA - WAIVED): 6.5 % — ABNORMAL HIGH (ref 4.8–5.6)

## 2021-12-08 NOTE — Progress Notes (Signed)
Subjective:  Patient ID: [name] [name],  male    DOB: 08-Apr-1941  Age: 81 y.o.    CC: Medical Management of Chronic Issues   HPI ELIGIO ANGERT presents for  follow-up of hypertension. Patient has no history of headache chest pain or shortness of breath or recent cough. Patient also denies symptoms of TIA such as numbness weakness lateralizing. Patient denies side effects from medication. States taking it regularly.  Patient also  in for follow-up of elevated cholesterol. Doing well without complaints on current medication. Denies side effects  including myalgia and arthralgia and nausea. Also in today for liver function testing. Currently no chest pain, shortness of breath or other cardiovascular related symptoms noted.  Follow-up of diabetes. Patient does check blood sugar at home. Readings run between 100 and 150 fasting  Patient denies symptoms such as excessive hunger or urinary frequency, excessive hunger, nausea No significant hypoglycemic spells noted. Medications reviewed. Pt reports taking them regularly. Semaglutide causing loss of appettie and nauseated. States his pen dials to 2, not one mg. Starting a month ago. Started tresiba about the same time.   History Yoshiharu has a past medical history of Basal cell carcinoma, CAD (coronary artery disease), Cardiac arrest ([place]), Cataract, Diabetes mellitus without complication ([place]), Diverticulitis, Erectile dysfunction, Gastroesophageal reflux disease with hiatal hernia, Goiter, nontoxic, multinodular, Hiatal hernia, Hyperlipidemia, Inguinal hernia, Kidney stone (1980), Leg fracture, Liver hemangioma, Nephrolithiasis, and Vitamin D deficiency.   He has a past surgical history that includes LEFT HEART CATH AND CORONARY ANGIOGRAPHY (N/A, 08/25/2018); CORONARY STENT INTERVENTION (N/A, 08/25/2018); Laceration repair (2008); Back surgery (1984); Tibia fracture surgery; Cataract extraction (Bilateral); Eye surgery; Adjacent tissue transfer/tissue  rearrangement (Left, 06/20/2020); Full thickness skin graft (Left, 06/20/2020); and skin cancer removal.   His family history includes Cancer in his father; Diabetes in an other family member; Early death (age of onset: 1) in his brother; Heart attack in his sister; Heart disease in his brother; Heart disease (age of onset: 58) in his mother.He reports that he quit smoking about 38 years ago. His smoking use included cigarettes. He has a 27.00 pack-year smoking history. He has never used smokeless tobacco. He reports that he does not drink alcohol and does not use drugs.  Current Outpatient Medications on File Prior to Visit  Medication Sig Dispense Refill   Accu-Chek FastClix Lancets MISC Check BS TID Dx E11.9 300 each 3   acetaminophen (TYLENOL) 325 MG tablet Take 2 tablets (650 mg total) by mouth every 4 (four) hours as needed for mild pain or headache.     amLODipine (NORVASC) 5 MG tablet Take 1 tablet (5 mg total) by mouth daily. For blood pressure 90 tablet 2   aspirin EC 81 MG tablet Take 1 tablet (81 mg total) by mouth daily. 90 tablet 3   atorvastatin (LIPITOR) 80 MG tablet Take 1 tablet (80 mg total) by mouth daily. For cholesterol 90 tablet 2   Blood Glucose Monitoring Suppl (ACCU-CHEK AVIVA PLUS) w/Device KIT Check BS TID Dx E11.9 1 kit 0   Cholecalciferol 125 MCG (5000 UT) capsule Take 5,000 Units by mouth. Take one tablet three times weekly      CINNAMON PO Take by mouth 2 (two) times daily.      fluticasone (FLONASE) 50 MCG/ACT nasal spray Place 2 sprays into both nostrils daily as needed for allergies. 90.3 mL 11   Garlic 009 MG TABS Take by mouth 2 (two) times daily.      glucose  blood (ACCU-CHEK AVIVA PLUS) test strip Check BS TID Dx E11.9 300 each 3   insulin degludec (TRESIBA FLEXTOUCH) 100 UNIT/ML FlexTouch Pen Inject 60 Units into the skin daily. 15 mL 3   irbesartan (AVAPRO) 150 MG tablet Take 1 tablet (150 mg total) by mouth daily. For blood pressure 90 tablet 2   levothyroxine  (SYNTHROID) 75 MCG tablet Take 1 tablet (75 mcg total) by mouth daily. For thyroid 90 tablet 2   metFORMIN (GLUCOPHAGE) 1000 MG tablet Take 1 tablet (1,000 mg total) by mouth 2 (two) times daily with a meal. For diabetes 180 tablet 2   metoprolol tartrate (LOPRESSOR) 50 MG tablet Take 0.5 tablets (25 mg total) by mouth daily. For blood pressure and heart 45 tablet 2   Semaglutide, 1 MG/DOSE, (OZEMPIC, 1 MG/DOSE,) 4 MG/3ML SOPN Inject 1 mg into the skin once a week. 9 mL 1   No current facility-administered medications on file prior to visit.    ROS Review of Systems  Constitutional:  Negative for fever.  Respiratory:  Negative for shortness of breath.   Cardiovascular:  Negative for chest pain.  Musculoskeletal:  Negative for arthralgias.  Skin:  Negative for rash.    Objective:  BP 128/65   Pulse 66   Temp (!) 97.2 F (36.2 C)   Ht _0  (1.778 m)   Wt 161 lb 12.8 oz (73.4 kg)   SpO2 99%   BMI 23.22 kg/m   BP Readings from Last 3 Encounters:  12/08/21 128/65  09/07/21 130/70  08/26/21 120/78    Wt Readings from Last 3 Encounters:  12/08/21 161 lb 12.8 oz (73.4 kg)  10/15/21 164 lb (74.4 kg)  09/07/21 164 lb 6.4 oz (74.6 kg)     Physical Exam Vitals reviewed.  Constitutional:      Appearance: He is well-developed.  HENT:     Head: Normocephalic and atraumatic.     Right Ear: External ear normal.     Left Ear: External ear normal.     Mouth/Throat:     Pharynx: No oropharyngeal exudate or posterior oropharyngeal erythema.  Eyes:     Pupils: Pupils are equal, round, and reactive to light.  Cardiovascular:     Rate and Rhythm: Normal rate and regular rhythm.     Heart sounds: No murmur heard. Pulmonary:     Effort: No respiratory distress.     Breath sounds: Normal breath sounds.  Musculoskeletal:     Cervical back: Normal range of motion and neck supple.  Neurological:     Mental Status: He is alert and oriented to person, place, and time.     Diabetic  Foot Exam - Simple   Simple Foot Form Diabetic Foot exam was performed with the following findings: Yes 12/08/2021  8:48 AM  Visual Inspection No deformities, no ulcerations, no other skin breakdown bilaterally: Yes Sensation Testing Intact to touch and monofilament testing bilaterally: Yes Pulse Check Posterior Tibialis and Dorsalis pulse intact bilaterally: Yes Comments     Lab Results  Component Value Date   HGBA1C 7.4 (H) 09/07/2021   HGBA1C 8.1 (H) 06/08/2021   HGBA1C 7.8 (H) 03/05/2021    Assessment & Plan:   Valin was seen today for medical management of chronic issues.  Diagnoses and all orders for this visit:  Type 2 diabetes mellitus with other specified complication, with long-term current use of insulin ([place]) -     Bayer DCA Hb A1c Waived  Essential hypertension -  CBC with Differential/Platelet -     CMP14+EGFR  Mixed hyperlipidemia -     Lipid panel  Hypothyroidism due to acquired atrophy of thyroid -     TSH + free T4   I am having Braydin F. Nicole Kindred maintain his acetaminophen, Cholecalciferol, Garlic, Accu-Chek Aviva Plus, Accu-Chek Aviva Plus, Accu-Chek FastClix Lancets, CINNAMON PO, aspirin EC, fluticasone, Ozempic (1 MG/DOSE), Tresiba FlexTouch, metoprolol tartrate, amLODipine, atorvastatin, irbesartan, levothyroxine, and metFORMIN.  Messaged J. Pruitt to reduce Ozempic back to 1 mg.    Follow-up: Return in about 3 months (around 03/10/2022).  Claretta Fraise, M.D.

## 2021-12-09 LAB — LIPID PANEL
Chol/HDL Ratio: 1.7 ratio (ref 0.0–5.0)
Cholesterol, Total: 66 mg/dL — ABNORMAL LOW (ref 100–199)
HDL: 40 mg/dL (ref >39)
LDL Chol Calc (NIH): 13 mg/dL (ref 0–99)
Triglycerides: 45 mg/dL (ref 0–149)
VLDL Cholesterol Cal: 13 mg/dL (ref 5–40)

## 2021-12-09 LAB — CMP14+EGFR
ALT: 25 IU/L (ref 0–44)
AST: 22 IU/L (ref 0–40)
Albumin/Globulin Ratio: 1.6 (ref 1.2–2.2)
Albumin: 4.2 g/dL (ref 3.7–4.7)
Alkaline Phosphatase: 83 IU/L (ref 44–121)
BUN/Creatinine Ratio: 13 (ref 10–24)
BUN: 10 mg/dL (ref 8–27)
Bilirubin Total: 0.3 mg/dL (ref 0.0–1.2)
CO2: 24 mmol/L (ref 20–29)
Calcium: 10.4 mg/dL — ABNORMAL HIGH (ref 8.6–10.2)
Chloride: 103 mmol/L (ref 96–106)
Creatinine, Ser: 0.8 mg/dL (ref 0.76–1.27)
Globulin, Total: 2.7 g/dL (ref 1.5–4.5)
Glucose: 47 mg/dL — ABNORMAL LOW (ref 70–99)
Potassium: 4.8 mmol/L (ref 3.5–5.2)
Sodium: 142 mmol/L (ref 134–144)
Total Protein: 6.9 g/dL (ref 6.0–8.5)
eGFR: 89 mL/min/{1.73_m2} (ref >59)

## 2021-12-09 LAB — CBC WITH DIFFERENTIAL/PLATELET
Basophils Absolute: 0 10*3/uL (ref 0.0–0.2)
Basos: 1 %
EOS (ABSOLUTE): 0.2 10*3/uL (ref 0.0–0.4)
Eos: 2 %
Hematocrit: 43.1 % (ref 37.5–51.0)
Hemoglobin: 14.2 g/dL (ref 13.0–17.7)
Immature Grans (Abs): 0 10*3/uL (ref 0.0–0.1)
Immature Granulocytes: 0 %
Lymphocytes Absolute: 1.5 10*3/uL (ref 0.7–3.1)
Lymphs: 20 %
MCH: 28 pg (ref 26.6–33.0)
MCHC: 32.9 g/dL (ref 31.5–35.7)
MCV: 85 fL (ref 79–97)
Monocytes Absolute: 0.6 10*3/uL (ref 0.1–0.9)
Monocytes: 7 %
Neutrophils Absolute: 5.3 10*3/uL (ref 1.4–7.0)
Neutrophils: 70 %
Platelets: 236 10*3/uL (ref 150–450)
RBC: 5.08 x10E6/uL (ref 4.14–5.80)
RDW: 13.4 % (ref 11.6–15.4)
WBC: 7.6 10*3/uL (ref 3.4–10.8)

## 2021-12-09 LAB — TSH+FREE T4
Free T4: 1.51 ng/dL (ref 0.82–1.77)
TSH: 2.29 u[IU]/mL (ref 0.450–4.500)

## 2021-12-09 NOTE — Progress Notes (Signed)
Hello Chukwudi,  Your lab result is normal and/or stable.Some minor variations that are not significant are commonly marked abnormal, but do not represent any medical problem for you.  Best regards, Eldrick Penick, M.D.

## 2021-12-17 ENCOUNTER — Other Ambulatory Visit: Payer: Self-pay | Admitting: Family Medicine

## 2021-12-29 ENCOUNTER — Telehealth: Payer: Self-pay

## 2021-12-29 NOTE — Telephone Encounter (Signed)
3 boxes of Ozempic arrived from pt assistance - called to notify the patient to pick up - LMTCB

## 2022-01-14 ENCOUNTER — Ambulatory Visit (INDEPENDENT_AMBULATORY_CARE_PROVIDER_SITE_OTHER): Payer: Medicare HMO | Admitting: Pharmacist

## 2022-01-14 DIAGNOSIS — E1169 Type 2 diabetes mellitus with other specified complication: Secondary | ICD-10-CM

## 2022-01-14 DIAGNOSIS — Z794 Long term (current) use of insulin: Secondary | ICD-10-CM | POA: Diagnosis not present

## 2022-01-14 NOTE — Progress Notes (Signed)
    01/14/2022 Name: [name] [name] MRN: [id number] DOB: 09/25/1940   S:  81yoM for follow up type 2 diabetes evaluation, education, and management Patient was referred and last seen by Primary Care Provider on 11/2021.  Insurance coverage/medication affordability: Psychologist, prison and probation services; patient assistance novo nordisk  Patient reports adherence with medications. Current diabetes medications include: tresiba, ozempic, metformin Current hypertension medications include: irbesartan, metoprolol, norvasc Goal 130/80 Current hyperlipidemia medications include: atorvastatin LDL 13 on 2023 STATIN ALLERGY/INTOLERANCE DOCUMENTED IN EMR n/a    Patient denies hypoglycemic events.   Patient reported dietary habits: Eats 3 meals/day  Patient-reported exercise habits: n/a  O:  Lab Results  Component Value Date   HGBA1C 6.5 (H) 12/08/2021    Lipid Panel     Component Value Date/Time   CHOL 66 (L) 12/08/2021 0837   CHOL 128 10/16/2012 1545   TRIG 45 12/08/2021 0837   TRIG 52 03/18/2016 1126   TRIG 102 10/16/2012 1545   HDL 40 12/08/2021 0837   HDL 53 03/18/2016 1126   HDL 45 10/16/2012 1545   CHOLHDL 1.7 12/08/2021 0837   LDLCALC 13 12/08/2021 0837   LDLCALC 66 12/13/2013 0842   LDLCALC 63 10/16/2012 1545     Clinical Atherosclerotic Cardiovascular Disease (ASCVD): No   The ASCVD Risk score (Arnett DK, et al., 2019) failed to calculate for the following reasons:   The 2019 ASCVD risk score is only valid for ages 68 to 31    A/P:  Recommendations/Changes made from today's visit:   Diabetes: Patient now Controlled with the aid of patient assistance programs  A1C 6.5%  Current treatment: OZEMPIC '1MG'$  SQ WEEKLY WEDNESDAYS,  Tresiba 60 UNITS EACH MORNING, METFORMIN 1 G TWICE DAILY WITH MEALS-GFR 91;  NOTED SGTL2 INTOLERANCE CONTINUE OZEMPIC TO '1MG'$  SQ WEEKLY Denies personal and family history of Medullary thyroid cancer (MTC) TOLERATING WELL; DENIES SIDE EFFECTS SHIPMENT came  last week FROM NOVO NORDISK PAP Current glucose readings: fasting glucose: 90-140, post prandial glucose: <190 Denies hypoglycemic/hyperglycemic symptoms Discussed meal planning options and Plate method for healthy eating Avoid sugary drinks and desserts Incorporate balanced protein, non starchy veggies, 1 serving of carbohydrate with each meal Increase water intake Increase physical activity as able Current exercise: N/A Recommended -- continue current therapy Assessed patient finances. enrolled in novo nordisk PAP for tresiba and ozempic DISCUSSED HYPERLIPIDEMIA/COMPLIANCE WITH MEDICATIONS-LDL 13  -Extensively discussed pathophysiology of diabetes, recommended lifestyle interventions, dietary effects on blood sugar control  -Counseled on s/sx of and management of hypoglycemia  -Next A1C anticipated 3-6 months.   Written patient instructions provided.  Total time in counseling 15 minutes.   Follow up Pharmacist Clinic Visit in 12.2023.   Regina Eck, PharmD, BCPS Clinical Pharmacist, [place]  II Phone [phone]

## 2022-02-18 ENCOUNTER — Telehealth: Payer: Self-pay

## 2022-02-18 NOTE — Telephone Encounter (Signed)
Mailed Novo Nordisk renewal application to patient home.   Timur Nibert L. CPhT Rx Patient Advocate  

## 2022-03-04 ENCOUNTER — Telehealth: Payer: Self-pay | Admitting: Family Medicine

## 2022-03-04 NOTE — Telephone Encounter (Signed)
Can we correct PAP ozempic to : inject '1mg'$  into skin weekly Patient can't tolerate the '2mg'$  dose  Thank you!

## 2022-03-04 NOTE — Telephone Encounter (Signed)
Faxed provider pages to office for Dr Livia Snellen or Dr Dettinger to complete and fax back.

## 2022-03-09 NOTE — Telephone Encounter (Signed)
Made appt for December 21. Left message on voice mail and mailed appointment reminder

## 2022-03-09 NOTE — Telephone Encounter (Signed)
[name] [name],   Pt does not have new CCM order. Do you want to have a new order placed or scheduled on Clinic schedule?  Thank you, Noreene Larsson, [place], [place] [address] Direct Dial: [phone] [email or web address]

## 2022-03-15 ENCOUNTER — Ambulatory Visit (INDEPENDENT_AMBULATORY_CARE_PROVIDER_SITE_OTHER): Payer: Medicare HMO | Admitting: Family Medicine

## 2022-03-15 ENCOUNTER — Encounter: Payer: Self-pay | Admitting: Family Medicine

## 2022-03-15 VITALS — BP 121/74 | HR 71 | Temp 97.2°F | Ht 70.0 in | Wt 160.6 lb

## 2022-03-15 DIAGNOSIS — I10 Essential (primary) hypertension: Secondary | ICD-10-CM | POA: Diagnosis not present

## 2022-03-15 DIAGNOSIS — I509 Heart failure, unspecified: Secondary | ICD-10-CM | POA: Diagnosis not present

## 2022-03-15 DIAGNOSIS — E1169 Type 2 diabetes mellitus with other specified complication: Secondary | ICD-10-CM | POA: Diagnosis not present

## 2022-03-15 DIAGNOSIS — Z23 Encounter for immunization: Secondary | ICD-10-CM | POA: Diagnosis not present

## 2022-03-15 DIAGNOSIS — E034 Atrophy of thyroid (acquired): Secondary | ICD-10-CM

## 2022-03-15 DIAGNOSIS — E782 Mixed hyperlipidemia: Secondary | ICD-10-CM | POA: Diagnosis not present

## 2022-03-15 DIAGNOSIS — Z794 Long term (current) use of insulin: Secondary | ICD-10-CM

## 2022-03-15 DIAGNOSIS — I110 Hypertensive heart disease with heart failure: Secondary | ICD-10-CM | POA: Diagnosis not present

## 2022-03-15 LAB — BAYER DCA HB A1C WAIVED: HB A1C (BAYER DCA - WAIVED): 7.4 % — ABNORMAL HIGH (ref 4.8–5.6)

## 2022-03-15 NOTE — Progress Notes (Signed)
Subjective:  Patient ID: [name] [name],  male    DOB: [birth year]  Age: 81 y.o.    CC: Medical Management of Chronic Issues   HPI [name] [name] presents for  follow-up of hypertension. Patient has no history of headache chest pain or shortness of breath or recent cough. Patient also denies symptoms of TIA such as numbness weakness lateralizing. Patient denies side effects from medication. States taking it regularly.  Patient also  in for follow-up of elevated cholesterol. Doing well without complaints on current medication. Denies side effects  including myalgia and arthralgia and nausea. Also in today for liver function testing. Currently no chest pain, shortness of breath or other cardiovascular related symptoms noted.  Follow-up of diabetes. Patient does check blood sugar at home. Readings run between 100 and 150 Patient denies symptoms such as excessive hunger or urinary frequency, excessive hunger, nausea No significant hypoglycemic spells noted. Medications reviewed. Pt reports taking them regularly. Pt. denies complication/adverse reaction today. Pt. Has been eating more corn, etc. Has decreased the ozempic back to 1 mg weekly since the 2 mg made him feel very bad.   History [name] [name] has a past medical history of Basal cell carcinoma, CAD (coronary artery disease), Cardiac arrest ([place]), Cataract, Diabetes mellitus without complication ([place]), Diverticulitis, Erectile dysfunction, Gastroesophageal reflux disease with hiatal hernia, Goiter, nontoxic, multinodular, Hiatal hernia, Hyperlipidemia, Inguinal hernia, Kidney stone (1980), Leg fracture, Liver hemangioma, Nephrolithiasis, and Vitamin D deficiency.   He has a past surgical history that includes LEFT HEART CATH AND CORONARY ANGIOGRAPHY (N/A, 08/25/2018); CORONARY STENT INTERVENTION (N/A, 08/25/2018); Laceration repair (2008); Back surgery (1984); Tibia fracture surgery; Cataract extraction (Bilateral); Eye surgery; Adjacent tissue  transfer/tissue rearrangement (Left, 06/20/2020); Full thickness skin graft (Left, 06/20/2020); and skin cancer removal.   His family history includes Cancer in his father; Diabetes in an other family member; Early death (age of onset: 59) in his brother; Heart attack in his sister; Heart disease in his brother; Heart disease (age of onset: 50) in his mother.He reports that he quit smoking about 38 years ago. His smoking use included cigarettes. He has a 27.00 pack-year smoking history. He has never used smokeless tobacco. He reports that he does not drink alcohol and does not use drugs.  Current Outpatient Medications on File Prior to Visit  Medication Sig Dispense Refill   Accu-Chek FastClix Lancets MISC Check BS TID Dx E11.9 300 each 3   acetaminophen (TYLENOL) 325 MG tablet Take 2 tablets (650 mg total) by mouth every 4 (four) hours as needed for mild pain or headache.     amLODipine (NORVASC) 5 MG tablet Take 1 tablet (5 mg total) by mouth daily. For blood pressure 90 tablet 2   aspirin EC 81 MG tablet Take 1 tablet (81 mg total) by mouth daily. 90 tablet 3   atorvastatin (LIPITOR) 80 MG tablet Take 1 tablet (80 mg total) by mouth daily. For cholesterol 90 tablet 2   Blood Glucose Monitoring Suppl (ACCU-CHEK AVIVA PLUS) w/Device KIT Check BS TID Dx E11.9 1 kit 0   Cholecalciferol 125 MCG (5000 UT) capsule Take 5,000 Units by mouth. Take one tablet three times weekly      CINNAMON PO Take by mouth 2 (two) times daily.      fluticasone (FLONASE) 50 MCG/ACT nasal spray PLACE 2 SPRAYS INTO BOTH NOSTRILS DAILY AS NEEDED FOR ALLERGIES. 16 g 3   Garlic 673 MG TABS Take by mouth 2 (two) times daily.  glucose blood (ACCU-CHEK AVIVA PLUS) test strip Check BS TID Dx E11.9 300 each 3   insulin degludec (TRESIBA FLEXTOUCH) 100 UNIT/ML FlexTouch Pen Inject 60 Units into the skin daily. 15 mL 3   irbesartan (AVAPRO) 150 MG tablet Take 1 tablet (150 mg total) by mouth daily. For blood pressure 90 tablet 2    levothyroxine (SYNTHROID) 75 MCG tablet Take 1 tablet (75 mcg total) by mouth daily. For thyroid 90 tablet 2   metFORMIN (GLUCOPHAGE) 1000 MG tablet Take 1 tablet (1,000 mg total) by mouth 2 (two) times daily with a meal. For diabetes 180 tablet 2   metoprolol tartrate (LOPRESSOR) 50 MG tablet Take 0.5 tablets (25 mg total) by mouth daily. For blood pressure and heart 45 tablet 2   Semaglutide, 1 MG/DOSE, (OZEMPIC, 1 MG/DOSE,) 4 MG/3ML SOPN Inject 1 mg into the skin once a week. 9 mL 1   No current facility-administered medications on file prior to visit.    ROS Review of Systems  Constitutional:  Negative for fever.  Respiratory:  Negative for shortness of breath.   Cardiovascular:  Negative for chest pain.  Musculoskeletal:  Negative for arthralgias.  Skin:  Negative for rash.    Objective:  BP 121/74   Pulse 71   Temp (!) 97.2 F (36.2 C)   Ht _0  (1.778 m)   Wt 160 lb 9.6 oz (72.8 kg)   SpO2 99%   BMI 23.04 kg/m   BP Readings from Last 3 Encounters:  03/15/22 121/74  12/08/21 128/65  09/07/21 130/70    Wt Readings from Last 3 Encounters:  03/15/22 160 lb 9.6 oz (72.8 kg)  12/08/21 161 lb 12.8 oz (73.4 kg)  10/15/21 164 lb (74.4 kg)     Physical Exam Vitals reviewed.  Constitutional:      Appearance: He is well-developed.  HENT:     Head: Normocephalic and atraumatic.     Right Ear: External ear normal.     Left Ear: External ear normal.     Mouth/Throat:     Pharynx: No oropharyngeal exudate or posterior oropharyngeal erythema.  Eyes:     Pupils: Pupils are equal, round, and reactive to light.  Cardiovascular:     Rate and Rhythm: Normal rate and regular rhythm.     Heart sounds: No murmur heard. Pulmonary:     Effort: No respiratory distress.     Breath sounds: Normal breath sounds.  Musculoskeletal:     Cervical back: Normal range of motion and neck supple.  Skin:    Findings: Lesion (several areas on forehead and nose.) present.  Neurological:      Mental Status: He is alert and oriented to person, place, and time.     Diabetic Foot Exam - Simple   No data filed     Lab Results  Component Value Date   HGBA1C 6.5 (H) 12/08/2021   HGBA1C 7.4 (H) 09/07/2021   HGBA1C 8.1 (H) 06/08/2021    Assessment & Plan:   [name] [name] was seen today for medical management of chronic issues.  Diagnoses and all orders for this visit:  Mixed hyperlipidemia -     Lipid panel  Type 2 diabetes mellitus with other specified complication, with long-term current use of insulin (HCC) -     Bayer DCA Hb A1c Waived -     Microalbumin / creatinine urine ratio  Essential hypertension -     CBC with Differential/Platelet -     CMP14+EGFR  Hypothyroidism due  to acquired atrophy of thyroid -     TSH + free T4  Need for influenza vaccination -     Flu Vaccine QUAD High Dose(Fluad)   I am having [name] [name]. [name] [name] maintain his acetaminophen, Cholecalciferol, Garlic, Accu-Chek Aviva Plus, Accu-Chek Aviva Plus, Accu-Chek FastClix Lancets, CINNAMON PO, aspirin EC, Ozempic (1 MG/DOSE), Tresiba FlexTouch, metoprolol tartrate, amLODipine, atorvastatin, irbesartan, levothyroxine, metFORMIN, and fluticasone.  Call your dermatologist for skin care right away   Follow-up: Return in about 3 months (around 06/15/2022).  Claretta Fraise, M.D.

## 2022-03-16 LAB — CMP14+EGFR
ALT: 27 IU/L (ref 0–44)
AST: 25 IU/L (ref 0–40)
Albumin/Globulin Ratio: 1.8 (ref 1.2–2.2)
Albumin: 4.4 g/dL (ref 3.7–4.7)
Alkaline Phosphatase: 99 IU/L (ref 44–121)
BUN/Creatinine Ratio: 15 (ref 10–24)
BUN: 12 mg/dL (ref 8–27)
Bilirubin Total: 0.4 mg/dL (ref 0.0–1.2)
CO2: 24 mmol/L (ref 20–29)
Calcium: 10.1 mg/dL (ref 8.6–10.2)
Chloride: 102 mmol/L (ref 96–106)
Creatinine, Ser: 0.79 mg/dL (ref 0.76–1.27)
Globulin, Total: 2.4 g/dL (ref 1.5–4.5)
Glucose: 119 mg/dL — ABNORMAL HIGH (ref 70–99)
Potassium: 4.3 mmol/L (ref 3.5–5.2)
Sodium: 139 mmol/L (ref 134–144)
Total Protein: 6.8 g/dL (ref 6.0–8.5)
eGFR: 89 mL/min/{1.73_m2} (ref >59)

## 2022-03-16 LAB — LIPID PANEL
Chol/HDL Ratio: 2.4 ratio (ref 0.0–5.0)
Cholesterol, Total: 80 mg/dL — ABNORMAL LOW (ref 100–199)
HDL: 34 mg/dL — ABNORMAL LOW (ref >39)
LDL Chol Calc (NIH): 31 mg/dL (ref 0–99)
Triglycerides: 65 mg/dL (ref 0–149)
VLDL Cholesterol Cal: 15 mg/dL (ref 5–40)

## 2022-03-16 LAB — CBC WITH DIFFERENTIAL/PLATELET
Basophils Absolute: 0.1 10*3/uL (ref 0.0–0.2)
Basos: 1 %
EOS (ABSOLUTE): 0.1 10*3/uL (ref 0.0–0.4)
Eos: 1 %
Hematocrit: 44.2 % (ref 37.5–51.0)
Hemoglobin: 14.6 g/dL (ref 13.0–17.7)
Immature Grans (Abs): 0 10*3/uL (ref 0.0–0.1)
Immature Granulocytes: 0 %
Lymphocytes Absolute: 1.9 10*3/uL (ref 0.7–3.1)
Lymphs: 19 %
MCH: 27.3 pg (ref 26.6–33.0)
MCHC: 33 g/dL (ref 31.5–35.7)
MCV: 83 fL (ref 79–97)
Monocytes Absolute: 0.8 10*3/uL (ref 0.1–0.9)
Monocytes: 8 %
Neutrophils Absolute: 7 10*3/uL (ref 1.4–7.0)
Neutrophils: 71 %
Platelets: 245 10*3/uL (ref 150–450)
RBC: 5.35 x10E6/uL (ref 4.14–5.80)
RDW: 12.6 % (ref 11.6–15.4)
WBC: 9.9 10*3/uL (ref 3.4–10.8)

## 2022-03-16 LAB — TSH+FREE T4
Free T4: 1.58 ng/dL (ref 0.82–1.77)
TSH: 3.59 u[IU]/mL (ref 0.450–4.500)

## 2022-03-16 LAB — MICROALBUMIN / CREATININE URINE RATIO
Creatinine, Urine: 126.8 mg/dL
Microalb/Creat Ratio: 26 mg/g creat (ref 0–29)
Microalbumin, Urine: 32.6 ug/mL

## 2022-03-16 NOTE — Progress Notes (Signed)
Hello Derreon,  Your lab result is normal and/or stable.Some minor variations that are not significant are commonly marked abnormal, but do not represent any medical problem for you.  Best regards, Careem Yasui, M.D.

## 2022-04-08 ENCOUNTER — Ambulatory Visit: Payer: Medicare HMO | Admitting: Pharmacist

## 2022-04-08 DIAGNOSIS — H04123 Dry eye syndrome of bilateral lacrimal glands: Secondary | ICD-10-CM | POA: Diagnosis not present

## 2022-04-08 DIAGNOSIS — H26493 Other secondary cataract, bilateral: Secondary | ICD-10-CM | POA: Diagnosis not present

## 2022-04-08 DIAGNOSIS — E113292 Type 2 diabetes mellitus with mild nonproliferative diabetic retinopathy without macular edema, left eye: Secondary | ICD-10-CM | POA: Diagnosis not present

## 2022-04-08 LAB — HM DIABETES EYE EXAM

## 2022-04-20 ENCOUNTER — Ambulatory Visit (INDEPENDENT_AMBULATORY_CARE_PROVIDER_SITE_OTHER): Payer: Medicare HMO | Admitting: Pharmacist

## 2022-04-20 VITALS — BP 130/80

## 2022-04-20 DIAGNOSIS — E782 Mixed hyperlipidemia: Secondary | ICD-10-CM

## 2022-04-20 DIAGNOSIS — E119 Type 2 diabetes mellitus without complications: Secondary | ICD-10-CM | POA: Diagnosis not present

## 2022-04-20 DIAGNOSIS — Z794 Long term (current) use of insulin: Secondary | ICD-10-CM

## 2022-04-20 NOTE — Progress Notes (Signed)
    04/20/2022 Name: [name] [name] MRN: [id number] DOB: 1940-12-02   81yoM for follow up type 2 diabetes evaluation, education, and management Patient was referred and last seen by Primary Care Provider on 02/2022.   Insurance coverage/medication affordability: Psychologist, prison and probation services; patient assistance novo nordisk   Patient reports adherence with medications. Current diabetes medications include: tresiba, ozempic, metformin Current hypertension medications include: irbesartan, metoprolol, norvasc Goal 130/80 Current hyperlipidemia medications include: atorvastatin LDL 31 on 2023 STATIN ALLERGY/INTOLERANCE DOCUMENTED IN EMR n/a   Patient denies hypoglycemic events.   Patient reported dietary habits: Eats 3 meals/day   Patient-reported exercise habits: n/a   O:   Lipid Panel     Component Value Date/Time   CHOL 80 (L) 03/15/2022 0901   CHOL 128 10/16/2012 1545   TRIG 65 03/15/2022 0901   TRIG 52 03/18/2016 1126   TRIG 102 10/16/2012 1545   HDL 34 (L) 03/15/2022 0901   HDL 53 03/18/2016 1126   HDL 45 10/16/2012 1545   CHOLHDL 2.4 03/15/2022 0901   LDLCALC 31 03/15/2022 0901   LDLCALC 66 12/13/2013 0842   LDLCALC 63 10/16/2012 1545   LABVLDL 15 03/15/2022 0901   Clinical Atherosclerotic Cardiovascular Disease (ASCVD): No    The ASCVD Risk score (Arnett DK, et al., 2019) failed to calculate for the following reasons:   The 2019 ASCVD risk score is only valid for ages 89 to 47     A/P:   Recommendations/Changes made from today's visit:   Diabetes: Patient now Controlled with the aid of patient assistance programs  A1C 6.5%-->7.4%  Current treatment: OZEMPIC '1MG'$  SQ WEEKLY WEDNESDAYS,  Tresiba 60 UNITS EACH MORNING, METFORMIN 1 G TWICE DAILY WITH MEALS-GFR 91;  NOTED SGTL2 INTOLERANCE CONTINUE OZEMPIC TO '1MG'$  SQ WEEKLY Denies personal and family history of Medullary thyroid cancer (MTC) TOLERATING WELL; DENIES SIDE EFFECTS SHIPMENT came last week FROM [employer]  PAP Current glucose readings: fasting glucose: 90-140, post prandial glucose: <190 Denies hypoglycemic/hyperglycemic symptoms Discussed meal planning options and Plate method for healthy eating Avoid sugary drinks and desserts Incorporate balanced protein, non starchy veggies, 1 serving of carbohydrate with each meal Increase water intake Increase physical activity as able Current exercise: N/A Recommended -- continue current therapy Assessed patient finances. re-enrollment pending for novo nordisk PAP for tresiba and ozempic DISCUSSED HYPERLIPIDEMIA/COMPLIANCE WITH MEDICATIONS-LDL 31   -Extensively discussed pathophysiology of diabetes, recommended lifestyle interventions, dietary effects on blood sugar control   -Counseled on s/sx of and management of hypoglycemia   -Next A1C anticipated 3-6 months.    Written patient instructions provided.  Total time in counseling 25 minutes.   Regina Eck, PharmD, BCPS, BCACP Clinical Pharmacist, [place]  [facility]  T [phone]

## 2022-04-28 ENCOUNTER — Encounter: Payer: Self-pay | Admitting: Pharmacist

## 2022-04-30 NOTE — Telephone Encounter (Signed)
Received notification from [place] regarding approval for [employer]. Patient assistance approved from 04/26/22 to 04/19/23.  MEDICATION WILL SHIP TO OFFICE  Phone: [phone]

## 2022-05-31 ENCOUNTER — Other Ambulatory Visit: Payer: Self-pay | Admitting: Family Medicine

## 2022-05-31 DIAGNOSIS — E119 Type 2 diabetes mellitus without complications: Secondary | ICD-10-CM

## 2022-05-31 DIAGNOSIS — I10 Essential (primary) hypertension: Secondary | ICD-10-CM

## 2022-06-02 ENCOUNTER — Telehealth: Payer: Self-pay

## 2022-06-02 NOTE — Telephone Encounter (Signed)
Patient informed that we have received a shipment of  his [name] [name] from patient assistance and it will be placed in the refrigerator for him to pick up.

## 2022-06-17 ENCOUNTER — Ambulatory Visit (INDEPENDENT_AMBULATORY_CARE_PROVIDER_SITE_OTHER): Payer: Medicare HMO | Admitting: Family Medicine

## 2022-06-17 ENCOUNTER — Encounter: Payer: Self-pay | Admitting: Family Medicine

## 2022-06-17 VITALS — BP 149/66 | HR 53 | Temp 97.9°F | Ht 70.0 in | Wt 164.0 lb

## 2022-06-17 DIAGNOSIS — E1169 Type 2 diabetes mellitus with other specified complication: Secondary | ICD-10-CM

## 2022-06-17 DIAGNOSIS — E782 Mixed hyperlipidemia: Secondary | ICD-10-CM

## 2022-06-17 DIAGNOSIS — I10 Essential (primary) hypertension: Secondary | ICD-10-CM

## 2022-06-17 DIAGNOSIS — I509 Heart failure, unspecified: Secondary | ICD-10-CM | POA: Diagnosis not present

## 2022-06-17 DIAGNOSIS — Z794 Long term (current) use of insulin: Secondary | ICD-10-CM

## 2022-06-17 DIAGNOSIS — E119 Type 2 diabetes mellitus without complications: Secondary | ICD-10-CM

## 2022-06-17 DIAGNOSIS — D485 Neoplasm of uncertain behavior of skin: Secondary | ICD-10-CM

## 2022-06-17 DIAGNOSIS — I110 Hypertensive heart disease with heart failure: Secondary | ICD-10-CM | POA: Diagnosis not present

## 2022-06-17 DIAGNOSIS — E034 Atrophy of thyroid (acquired): Secondary | ICD-10-CM | POA: Diagnosis not present

## 2022-06-17 LAB — BAYER DCA HB A1C WAIVED: HB A1C (BAYER DCA - WAIVED): 7.9 % — ABNORMAL HIGH (ref 4.8–5.6)

## 2022-06-17 MED ORDER — IRBESARTAN 150 MG PO TABS: ORAL_TABLET | ORAL | 3 refills | Status: DC

## 2022-06-17 MED ORDER — LEVOTHYROXINE SODIUM 75 MCG PO TABS: ORAL_TABLET | ORAL | 3 refills | Status: DC

## 2022-06-17 MED ORDER — AMLODIPINE BESYLATE 5 MG PO TABS: ORAL_TABLET | ORAL | 3 refills | Status: DC

## 2022-06-17 MED ORDER — ATORVASTATIN CALCIUM 80 MG PO TABS: ORAL_TABLET | ORAL | 3 refills | Status: DC

## 2022-06-17 MED ORDER — METOPROLOL TARTRATE 50 MG PO TABS: ORAL_TABLET | ORAL | 3 refills | Status: DC

## 2022-06-17 MED ORDER — METFORMIN HCL 1000 MG PO TABS: ORAL_TABLET | ORAL | 3 refills | Status: DC

## 2022-06-17 NOTE — Progress Notes (Signed)
Subjective:  Patient ID: [name] [name],  male    DOB: [birth year]  Age: 82 y.o.    CC: Medical Management of Chronic Issues   HPI AVANEESH GENRICH presents for  follow-up of hypertension. Patient has no history of headache chest pain or shortness of breath or recent cough. Patient also denies symptoms of TIA such as numbness weakness lateralizing. Patient denies side effects from medication. States taking it regularly.  Patient also  in for follow-up of elevated cholesterol. Doing well without complaints on current medication. Denies side effects  including myalgia and arthralgia and nausea. Also in today for liver function testing. Currently no chest pain, shortness of breath or other cardiovascular related symptoms noted.  Follow-up of diabetes. Patient does check blood sugar at home. Readings run between 80 and 150 Patient denies symptoms such as excessive hunger or urinary frequency, excessive hunger, nausea No significant hypoglycemic spells noted. Medications reviewed. Pt reports taking them regularly. Pt. denies complication/adverse reaction today.    follow-up on  thyroid. The patient has a history of hypothyroidism for many years. It has been stable recently. Pt. denies any change in  voice, loss of hair, heat or cold intolerance. Energy level has been adequate to good. Patient denies constipation and diarrhea. No myxedema. Medication is as noted below. Verified that pt is taking it daily on an empty stomach. Well tolerated.  History Amilio has a past medical history of Basal cell carcinoma, CAD (coronary artery disease), Cardiac arrest ([place]), Cataract, Diabetes mellitus without complication ([place]), Diverticulitis, Erectile dysfunction, Gastroesophageal reflux disease with hiatal hernia, Goiter, nontoxic, multinodular, Hiatal hernia, Hyperlipidemia, Inguinal hernia, Kidney stone (1980), Leg fracture, Liver hemangioma, Nephrolithiasis, and Vitamin D deficiency.   He has a past surgical  history that includes LEFT HEART CATH AND CORONARY ANGIOGRAPHY (N/A, 08/25/2018); CORONARY STENT INTERVENTION (N/A, 08/25/2018); Laceration repair (2008); Back surgery (1984); Tibia fracture surgery; Cataract extraction (Bilateral); Eye surgery; Adjacent tissue transfer/tissue rearrangement (Left, 06/20/2020); Full thickness skin graft (Left, 06/20/2020); and skin cancer removal.   His family history includes Cancer in his father; Diabetes in an other family member; Early death (age of onset: 69) in his brother; Heart attack in his sister; Heart disease in his brother; Heart disease (age of onset: 5) in his mother.He reports that he quit smoking about 39 years ago. His smoking use included cigarettes. He has a 27.00 pack-year smoking history. He has never used smokeless tobacco. He reports that he does not drink alcohol and does not use drugs.  Current Outpatient Medications on File Prior to Visit  Medication Sig Dispense Refill   Accu-Chek FastClix Lancets MISC Check BS TID Dx E11.9 300 each 3   acetaminophen (TYLENOL) 325 MG tablet Take 2 tablets (650 mg total) by mouth every 4 (four) hours as needed for mild pain or headache.     aspirin EC 81 MG tablet Take 1 tablet (81 mg total) by mouth daily. 90 tablet 3   Blood Glucose Monitoring Suppl (ACCU-CHEK AVIVA PLUS) w/Device KIT Check BS TID Dx E11.9 1 kit 0   Cholecalciferol 125 MCG (5000 UT) capsule Take 5,000 Units by mouth. Take one tablet three times weekly      CINNAMON PO Take by mouth 2 (two) times daily.      fluticasone (FLONASE) 50 MCG/ACT nasal spray PLACE 2 SPRAYS INTO BOTH NOSTRILS DAILY AS NEEDED FOR ALLERGIES. 16 g 3   Garlic 123XX123 MG TABS Take by mouth 2 (two) times daily.  glucose blood (ACCU-CHEK AVIVA PLUS) test strip Check BS TID Dx E11.9 300 each 3   insulin degludec (TRESIBA FLEXTOUCH) 100 UNIT/ML FlexTouch Pen Inject 60 Units into the skin daily. 15 mL 3   Semaglutide, 1 MG/DOSE, (OZEMPIC, 1 MG/DOSE,) 4 MG/3ML SOPN Inject 1 mg into  the skin once a week. 9 mL 1   No current facility-administered medications on file prior to visit.    ROS Review of Systems  Constitutional:  Negative for fever.  Respiratory:  Negative for shortness of breath.   Cardiovascular:  Negative for chest pain.  Musculoskeletal:  Negative for arthralgias.  Skin:  Negative for rash.    Objective:  BP (!) 149/66   Pulse (!) 53   Temp 97.9 F (36.6 C)   Ht '5\' 10"'$  (1.778 m)   Wt 164 lb (74.4 kg)   SpO2 98%   BMI 23.53 kg/m   BP Readings from Last 3 Encounters:  06/17/22 (!) 149/66  04/28/22 130/80  03/15/22 121/74    Wt Readings from Last 3 Encounters:  06/17/22 164 lb (74.4 kg)  03/15/22 160 lb 9.6 oz (72.8 kg)  12/08/21 161 lb 12.8 oz (73.4 kg)     Physical Exam Vitals reviewed.  Constitutional:      Appearance: He is well-developed.  HENT:     Head: Normocephalic and atraumatic.     Right Ear: External ear normal.     Left Ear: External ear normal.     Mouth/Throat:     Pharynx: No oropharyngeal exudate or posterior oropharyngeal erythema.  Eyes:     Pupils: Pupils are equal, round, and reactive to light.  Neck:     Thyroid: No thyromegaly.  Cardiovascular:     Rate and Rhythm: Normal rate and regular rhythm.     Heart sounds: No murmur heard. Pulmonary:     Effort: No respiratory distress.     Breath sounds: Normal breath sounds.  Musculoskeletal:     Cervical back: Normal range of motion and neck supple.  Skin:    Findings: Lesion (non healing ulcer at upper nose.) present.  Neurological:     Mental Status: He is alert and oriented to person, place, and time.        Lab Results  Component Value Date   HGBA1C 7.4 (H) 03/15/2022   HGBA1C 6.5 (H) 12/08/2021   HGBA1C 7.4 (H) 09/07/2021    Assessment & Plan:   Garv was seen today for medical management of chronic issues.  Diagnoses and all orders for this visit:  Type 2 diabetes mellitus treated with insulin (HCC) -     Bayer DCA Hb A1c  Waived -     irbesartan (AVAPRO) 150 MG tablet; TAKE 1 TABLET EVERY DAY FOR BLOOD PRESSURE -     metFORMIN (GLUCOPHAGE) 1000 MG tablet; TAKE 1 TABLET TWICE DAILY WITH MEALS FOR DIABETES  Mixed hyperlipidemia -     Lipid panel  Essential hypertension -     CBC with Differential/Platelet -     CMP14+EGFR -     irbesartan (AVAPRO) 150 MG tablet; TAKE 1 TABLET EVERY DAY FOR BLOOD PRESSURE -     metoprolol tartrate (LOPRESSOR) 50 MG tablet; TAKE 1/2 TABLET EVERY DAY FOR BLOOD PRESSURE AND HEART  Hypothyroidism due to acquired atrophy of thyroid -     TSH + free T4  Type 2 diabetes mellitus with other specified complication, with long-term current use of insulin (HCC)  Neoplasm of uncertain behavior of skin  Other orders -     amLODipine (NORVASC) 5 MG tablet; TAKE 1 TABLET EVERY DAY FOR BLOOD PRESSURE -     atorvastatin (LIPITOR) 80 MG tablet; TAKE 1 TABLET EVERY DAY FOR CHOLESTEROL -     levothyroxine (SYNTHROID) 75 MCG tablet; TAKE 1 TABLET EVERY DAY FOR THYROID   I am having Eason F. Nicole Kindred maintain his acetaminophen, Cholecalciferol, Garlic, Accu-Chek Aviva Plus, Accu-Chek Aviva Plus, Accu-Chek FastClix Lancets, CINNAMON PO, aspirin EC, Ozempic (1 MG/DOSE), Tresiba FlexTouch, fluticasone, amLODipine, atorvastatin, irbesartan, levothyroxine, metFORMIN, and metoprolol tartrate.  Meds ordered this encounter  Medications   amLODipine (NORVASC) 5 MG tablet    Sig: TAKE 1 TABLET EVERY DAY FOR BLOOD PRESSURE    Dispense:  90 tablet    Refill:  3   atorvastatin (LIPITOR) 80 MG tablet    Sig: TAKE 1 TABLET EVERY DAY FOR CHOLESTEROL    Dispense:  90 tablet    Refill:  3   irbesartan (AVAPRO) 150 MG tablet    Sig: TAKE 1 TABLET EVERY DAY FOR BLOOD PRESSURE    Dispense:  90 tablet    Refill:  3   levothyroxine (SYNTHROID) 75 MCG tablet    Sig: TAKE 1 TABLET EVERY DAY FOR THYROID    Dispense:  90 tablet    Refill:  3   metFORMIN (GLUCOPHAGE) 1000 MG tablet    Sig: TAKE 1 TABLET  TWICE DAILY WITH MEALS FOR DIABETES    Dispense:  180 tablet    Refill:  3   metoprolol tartrate (LOPRESSOR) 50 MG tablet    Sig: TAKE 1/2 TABLET EVERY DAY FOR BLOOD PRESSURE AND HEART    Dispense:  45 tablet    Refill:  3     Follow-up: Return in about 3 months (around 09/15/2022).  Claretta Fraise, M.D.

## 2022-06-17 NOTE — Patient Instructions (Addendum)
DASH Eating Plan DASH stands for Dietary Approaches to Stop Hypertension. The DASH eating plan is a healthy eating plan that has been shown to: Reduce high blood pressure (hypertension). Reduce your risk for type 2 diabetes, heart disease, and stroke. Help with weight loss. What are tips for following this plan? Reading food labels Check food labels for the amount of salt (sodium) per serving. Choose foods with less than 5 percent of the Daily Value of sodium. Generally, foods with less than 300 milligrams (mg) of sodium per serving fit into this eating plan. To find whole grains, look for the word "whole" as the first word in the ingredient list. Shopping Buy products labeled as "low-sodium" or "no salt added." Buy fresh foods. Avoid canned foods and pre-made or frozen meals. Cooking Avoid adding salt when cooking. Use salt-free seasonings or herbs instead of table salt or sea salt. Check with your health care provider or pharmacist before using salt substitutes. Do not fry foods. Cook foods using healthy methods such as baking, boiling, grilling, roasting, and broiling instead. Cook with heart-healthy oils, such as olive, canola, avocado, soybean, or sunflower oil. Meal planning  Eat a balanced diet that includes: 4 or more servings of fruits and 4 or more servings of vegetables each day. Try to fill one-half of your plate with fruits and vegetables. 6-8 servings of whole grains each day. Less than 6 oz (170 g) of lean meat, poultry, or fish each day. A 3-oz (85-g) serving of meat is about the same size as a deck of cards. One egg equals 1 oz (28 g). 2-3 servings of low-fat dairy each day. One serving is 1 cup (237 mL). 1 serving of nuts, seeds, or beans 5 times each week. 2-3 servings of heart-healthy fats. Healthy fats called omega-3 fatty acids are found in foods such as walnuts, flaxseeds, fortified milks, and eggs. These fats are also found in cold-water fish, such as sardines, salmon,  and mackerel. Limit how much you eat of: Canned or prepackaged foods. Food that is high in trans fat, such as some fried foods. Food that is high in saturated fat, such as fatty meat. Desserts and other sweets, sugary drinks, and other foods with added sugar. Full-fat dairy products. Do not salt foods before eating. Do not eat more than 4 egg yolks a week. Try to eat at least 2 vegetarian meals a week. Eat more home-cooked food and less restaurant, buffet, and fast food. Lifestyle When eating at a restaurant, ask that your food be prepared with less salt or no salt, if possible. If you drink alcohol: Limit how much you use to: 0-1 drink a day for women who are not pregnant. 0-2 drinks a day for men. Be aware of how much alcohol is in your drink. In the [place] one drink equals one 12 oz bottle of beer (355 mL), one 5 oz glass of wine (148 mL), or one 1 oz glass of hard liquor (44 mL). General information Avoid eating more than 2,300 mg of salt a day. If you have hypertension, you may need to reduce your sodium intake to 1,500 mg a day. Work with your health care provider to maintain a healthy body weight or to lose weight. Ask what an ideal weight is for you. Get at least 30 minutes of exercise that causes your heart to beat faster (aerobic exercise) most days of the week. Activities may include walking, swimming, or biking. Work with your health care provider or dietitian to   adjust your eating plan to your individual calorie needs. What foods should I eat? Fruits All fresh, dried, or frozen fruit. Canned fruit in natural juice (without added sugar). Vegetables Fresh or frozen vegetables (raw, steamed, roasted, or grilled). Low-sodium or reduced-sodium tomato and vegetable juice. Low-sodium or reduced-sodium tomato sauce and tomato paste. Low-sodium or reduced-sodium canned vegetables. Grains Whole-grain or whole-wheat bread. Whole-grain or whole-wheat pasta. Brown rice. Oatmeal. Quinoa.  Bulgur. Whole-grain and low-sodium cereals. Pita bread. Low-fat, low-sodium crackers. Whole-wheat flour tortillas. Meats and other proteins Skinless chicken or turkey. Ground chicken or turkey. Pork with fat trimmed off. Fish and seafood. Egg whites. Dried beans, peas, or lentils. Unsalted nuts, nut butters, and seeds. Unsalted canned beans. Lean cuts of beef with fat trimmed off. Low-sodium, lean precooked or cured meat, such as sausages or meat loaves. Dairy Low-fat (1%) or fat-free (skim) milk. Reduced-fat, low-fat, or fat-free cheeses. Nonfat, low-sodium ricotta or cottage cheese. Low-fat or nonfat yogurt. Low-fat, low-sodium cheese. Fats and oils Soft margarine without trans fats. Vegetable oil. Reduced-fat, low-fat, or light mayonnaise and salad dressings (reduced-sodium). Canola, safflower, olive, avocado, soybean, and sunflower oils. Avocado. Seasonings and condiments Herbs. Spices. Seasoning mixes without salt. Other foods Unsalted popcorn and pretzels. Fat-free sweets. The items listed above may not be a complete list of foods and beverages you can eat. Contact a dietitian for more information. What foods should I avoid? Fruits Canned fruit in a light or heavy syrup. Fried fruit. Fruit in cream or butter sauce. Vegetables Creamed or fried vegetables. Vegetables in a cheese sauce. Regular canned vegetables (not low-sodium or reduced-sodium). Regular canned tomato sauce and paste (not low-sodium or reduced-sodium). Regular tomato and vegetable juice (not low-sodium or reduced-sodium). Pickles. Olives. Grains Baked goods made with fat, such as croissants, muffins, or some breads. Dry pasta or rice meal packs. Meats and other proteins Fatty cuts of meat. Ribs. Fried meat. Bacon. Bologna, salami, and other precooked or cured meats, such as sausages or meat loaves. Fat from the back of a pig (fatback). Bratwurst. Salted nuts and seeds. Canned beans with added salt. Canned or smoked fish.  Whole eggs or egg yolks. Chicken or turkey with skin. Dairy Whole or 2% milk, cream, and half-and-half. Whole or full-fat cream cheese. Whole-fat or sweetened yogurt. Full-fat cheese. Nondairy creamers. Whipped toppings. Processed cheese and cheese spreads. Fats and oils Butter. Stick margarine. Lard. Shortening. Ghee. Bacon fat. Tropical oils, such as coconut, palm kernel, or palm oil. Seasonings and condiments Onion salt, garlic salt, seasoned salt, table salt, and sea salt. Worcestershire sauce. Tartar sauce. Barbecue sauce. Teriyaki sauce. Soy sauce, including reduced-sodium. Steak sauce. Canned and packaged gravies. Fish sauce. Oyster sauce. Cocktail sauce. Store-bought horseradish. Ketchup. Mustard. Meat flavorings and tenderizers. Bouillon cubes. Hot sauces. Pre-made or packaged marinades. Pre-made or packaged taco seasonings. Relishes. Regular salad dressings. Other foods Salted popcorn and pretzels. The items listed above may not be a complete list of foods and beverages you should avoid. Contact a dietitian for more information. Where to find more information National Heart, Lung, and Blood Institute: www.nhlbi.nih.gov American Heart Association: www.heart.org Academy of Nutrition and Dietetics: www.eatright.org National Kidney Foundation: www.kidney.org Summary The DASH eating plan is a healthy eating plan that has been shown to reduce high blood pressure (hypertension). It may also reduce your risk for type 2 diabetes, heart disease, and stroke. When on the DASH eating plan, aim to eat more fresh fruits and vegetables, whole grains, lean proteins, low-fat dairy, and heart-healthy fats. With the DASH   eating plan, you should limit salt (sodium) intake to 2,300 mg a day. If you have hypertension, you may need to reduce your sodium intake to 1,500 mg a day. Work with your health care provider or dietitian to adjust your eating plan to your individual calorie needs. This information is not  intended to replace advice given to you by your health care provider. Make sure you discuss any questions you have with your health care provider. Document Revised: 03/09/2019 Document Reviewed: 03/09/2019 Elsevier Patient Education  2023 [employer] Carbohydrate Counting for Diabetes Mellitus, Adult Carbohydrate counting is a method of keeping track of how many carbohydrates you eat. Eating carbohydrates increases the amount of sugar (glucose) in the blood. Counting how many carbohydrates you eat improves how well you manage your blood glucose. This, in turn, helps you manage your diabetes. Carbohydrates are measured in grams (g) per serving. It is important to know how many carbohydrates (in grams or by serving size) you can have in each meal. This is different for every person. A dietitian can help you make a meal plan and calculate how many carbohydrates you should have at each meal and snack. What foods contain carbohydrates? Carbohydrates are found in the following foods: Grains, such as breads and cereals. Dried beans and soy products. Starchy vegetables, such as potatoes, peas, and corn. Fruit and fruit juices. Milk and yogurt. Sweets and snack foods, such as cake, cookies, candy, chips, and soft drinks. How do I count carbohydrates in foods? There are two ways to count carbohydrates in food. You can read food labels or learn standard serving sizes of foods. You can use either of these methods or a combination of both. Using the Nutrition Facts label The Nutrition Facts list is included on the labels of almost all packaged foods and beverages in the [place]. It includes: The serving size. Information about nutrients in each serving, including the grams of carbohydrate per serving. To use the Nutrition Facts, decide how many servings you will have. Then, multiply the number of servings by the number of carbohydrates per serving. The resulting number is the total grams of  carbohydrates that you will be having. Learning the standard serving sizes of foods When you eat carbohydrate foods that are not packaged or do not include Nutrition Facts on the label, you need to measure the servings in order to count the grams of carbohydrates. Measure the foods that you will eat with a food scale or measuring cup, if needed. Decide how many standard-size servings you will eat. Multiply the number of servings by 15. For foods that contain carbohydrates, one serving equals 15 g of carbohydrates. For example, if you eat 2 cups or 10 oz (300 g) of strawberries, you will have eaten 2 servings and 30 g of carbohydrates (2 servings x 15 g = 30 g). For foods that have more than one food mixed, such as soups and casseroles, you must count the carbohydrates in each food that is included. The following list contains standard serving sizes of common carbohydrate-rich foods. Each of these servings has about 15 g of carbohydrates: 1 slice of bread. 1 six-inch (15 cm) tortilla. ? cup or 2 oz (53 g) cooked rice or pasta.  cup or 3 oz (85 g) cooked or canned, drained and rinsed beans or lentils.  cup or 3 oz (85 g) starchy vegetable, such as peas, corn, or squash.  cup or 4 oz (120 g) hot cereal.  cup or 3 oz (85   g) boiled or mashed potatoes, or  or 3 oz (85 g) of a large baked potato.  cup or 4 fl oz (118 mL) fruit juice. 1 cup or 8 fl oz (237 mL) milk. 1 small or 4 oz (106 g) apple.  or 2 oz (63 g) of a medium banana. 1 cup or 5 oz (150 g) strawberries. 3 cups or 1 oz (28.3 g) popped popcorn. What is an example of carbohydrate counting? To calculate the grams of carbohydrates in this sample meal, follow the steps shown below. Sample meal 3 oz (85 g) chicken breast. ? cup or 4 oz (106 g) brown rice.  cup or 3 oz (85 g) corn. 1 cup or 8 fl oz (237 mL) milk. 1 cup or 5 oz (150 g) strawberries with sugar-free whipped topping. Carbohydrate calculation Identify the foods that  contain carbohydrates: Rice. Corn. Milk. Strawberries. Calculate how many servings you have of each food: 2 servings rice. 1 serving corn. 1 serving milk. 1 serving strawberries. Multiply each number of servings by 15 g: 2 servings rice x 15 g = 30 g. 1 serving corn x 15 g = 15 g. 1 serving milk x 15 g = 15 g. 1 serving strawberries x 15 g = 15 g. Add together all of the amounts to find the total grams of carbohydrates eaten: 30 g + 15 g + 15 g + 15 g = 75 g of carbohydrates total. What are tips for following this plan? Shopping Develop a meal plan and then make a shopping list. Buy fresh and frozen vegetables, fresh and frozen fruit, dairy, eggs, beans, lentils, and whole grains. Look at food labels. Choose foods that have more fiber and less sugar. Avoid processed foods and foods with added sugars. Meal planning Aim to have the same number of grams of carbohydrates at each meal and for each snack time. Plan to have regular, balanced meals and snacks. Where to find more information American Diabetes Association: diabetes.org Centers for Disease Control and Prevention: cdc.gov Academy of Nutrition and Dietetics: eatright.org Association of Diabetes Care & Education Specialists: diabeteseducator.org Summary Carbohydrate counting is a method of keeping track of how many carbohydrates you eat. Eating carbohydrates increases the amount of sugar (glucose) in your blood. Counting how many carbohydrates you eat improves how well you manage your blood glucose. This helps you manage your diabetes. A dietitian can help you make a meal plan and calculate how many carbohydrates you should have at each meal and snack. This information is not intended to replace advice given to you by your health care provider. Make sure you discuss any questions you have with your health care provider. Document Revised: 11/07/2019 Document Reviewed: 11/07/2019 Elsevier Patient Education  2023 [employer]  Inc[employer]  

## 2022-06-18 LAB — CMP14+EGFR
ALT: 20 IU/L (ref 0–44)
AST: 21 IU/L (ref 0–40)
Albumin/Globulin Ratio: 1.9 (ref 1.2–2.2)
Albumin: 4.3 g/dL (ref 3.7–4.7)
Alkaline Phosphatase: 86 IU/L (ref 44–121)
BUN/Creatinine Ratio: 19 (ref 10–24)
BUN: 15 mg/dL (ref 8–27)
Bilirubin Total: 0.4 mg/dL (ref 0.0–1.2)
CO2: 22 mmol/L (ref 20–29)
Calcium: 9.6 mg/dL (ref 8.6–10.2)
Chloride: 104 mmol/L (ref 96–106)
Creatinine, Ser: 0.8 mg/dL (ref 0.76–1.27)
Globulin, Total: 2.3 g/dL (ref 1.5–4.5)
Glucose: 108 mg/dL — ABNORMAL HIGH (ref 70–99)
Potassium: 4.9 mmol/L (ref 3.5–5.2)
Sodium: 142 mmol/L (ref 134–144)
Total Protein: 6.6 g/dL (ref 6.0–8.5)
eGFR: 89 mL/min/{1.73_m2} (ref >59)

## 2022-06-18 LAB — CBC WITH DIFFERENTIAL/PLATELET
Basophils Absolute: 0.1 10*3/uL (ref 0.0–0.2)
Basos: 1 %
EOS (ABSOLUTE): 0.1 10*3/uL (ref 0.0–0.4)
Eos: 1 %
Hematocrit: 41.2 % (ref 37.5–51.0)
Hemoglobin: 13.4 g/dL (ref 13.0–17.7)
Immature Grans (Abs): 0 10*3/uL (ref 0.0–0.1)
Immature Granulocytes: 0 %
Lymphocytes Absolute: 1.9 10*3/uL (ref 0.7–3.1)
Lymphs: 25 %
MCH: 27.1 pg (ref 26.6–33.0)
MCHC: 32.5 g/dL (ref 31.5–35.7)
MCV: 83 fL (ref 79–97)
Monocytes Absolute: 0.7 10*3/uL (ref 0.1–0.9)
Monocytes: 9 %
Neutrophils Absolute: 4.8 10*3/uL (ref 1.4–7.0)
Neutrophils: 64 %
Platelets: 224 10*3/uL (ref 150–450)
RBC: 4.95 x10E6/uL (ref 4.14–5.80)
RDW: 13 % (ref 11.6–15.4)
WBC: 7.6 10*3/uL (ref 3.4–10.8)

## 2022-06-18 LAB — LIPID PANEL
Chol/HDL Ratio: 2 ratio (ref 0.0–5.0)
Cholesterol, Total: 72 mg/dL — ABNORMAL LOW (ref 100–199)
HDL: 36 mg/dL — ABNORMAL LOW (ref >39)
LDL Chol Calc (NIH): 24 mg/dL (ref 0–99)
Triglycerides: 45 mg/dL (ref 0–149)
VLDL Cholesterol Cal: 12 mg/dL (ref 5–40)

## 2022-06-18 LAB — TSH+FREE T4
Free T4: 1.52 ng/dL (ref 0.82–1.77)
TSH: 2.01 u[IU]/mL (ref 0.450–4.500)

## 2022-06-19 NOTE — Progress Notes (Signed)
Hello Kapil,  Your lab result is normal and/or stable.Some minor variations that are not significant are commonly marked abnormal, but do not represent any medical problem for you.  Best regards, Claretta Fraise, M.D.

## 2022-06-30 ENCOUNTER — Telehealth: Payer: Self-pay

## 2022-06-30 NOTE — Telephone Encounter (Signed)
Patient's wife aware that patient's Ozempic has been received and will be placed in the lab refrigerator for pick up.

## 2022-07-16 DIAGNOSIS — L820 Inflamed seborrheic keratosis: Secondary | ICD-10-CM | POA: Diagnosis not present

## 2022-07-16 DIAGNOSIS — Z789 Other specified health status: Secondary | ICD-10-CM | POA: Diagnosis not present

## 2022-07-16 DIAGNOSIS — C4441 Basal cell carcinoma of skin of scalp and neck: Secondary | ICD-10-CM | POA: Diagnosis not present

## 2022-07-16 DIAGNOSIS — Z08 Encounter for follow-up examination after completed treatment for malignant neoplasm: Secondary | ICD-10-CM | POA: Diagnosis not present

## 2022-07-16 DIAGNOSIS — R208 Other disturbances of skin sensation: Secondary | ICD-10-CM | POA: Diagnosis not present

## 2022-07-16 DIAGNOSIS — C44321 Squamous cell carcinoma of skin of nose: Secondary | ICD-10-CM | POA: Diagnosis not present

## 2022-07-16 DIAGNOSIS — L298 Other pruritus: Secondary | ICD-10-CM | POA: Diagnosis not present

## 2022-07-16 DIAGNOSIS — R229 Localized swelling, mass and lump, unspecified: Secondary | ICD-10-CM | POA: Diagnosis not present

## 2022-07-16 DIAGNOSIS — C44329 Squamous cell carcinoma of skin of other parts of face: Secondary | ICD-10-CM | POA: Diagnosis not present

## 2022-07-16 DIAGNOSIS — L538 Other specified erythematous conditions: Secondary | ICD-10-CM | POA: Diagnosis not present

## 2022-07-16 DIAGNOSIS — Z85828 Personal history of other malignant neoplasm of skin: Secondary | ICD-10-CM | POA: Diagnosis not present

## 2022-08-04 DIAGNOSIS — D485 Neoplasm of uncertain behavior of skin: Secondary | ICD-10-CM | POA: Diagnosis not present

## 2022-08-04 DIAGNOSIS — C44321 Squamous cell carcinoma of skin of nose: Secondary | ICD-10-CM | POA: Diagnosis not present

## 2022-08-18 DIAGNOSIS — C44329 Squamous cell carcinoma of skin of other parts of face: Secondary | ICD-10-CM | POA: Diagnosis not present

## 2022-08-18 DIAGNOSIS — D485 Neoplasm of uncertain behavior of skin: Secondary | ICD-10-CM | POA: Diagnosis not present

## 2022-08-27 ENCOUNTER — Other Ambulatory Visit: Payer: Self-pay

## 2022-08-27 ENCOUNTER — Emergency Department (HOSPITAL_COMMUNITY)
Admission: EM | Admit: 2022-08-27 | Discharge: 2022-08-27 | Disposition: A | Discharge status: Home / Self Care | Payer: Medicare HMO | Attending: Emergency Medicine | Admitting: Emergency Medicine

## 2022-08-27 ENCOUNTER — Ambulatory Visit: Payer: Medicare HMO | Admitting: Family

## 2022-08-27 ENCOUNTER — Emergency Department (HOSPITAL_COMMUNITY): Payer: Medicare HMO

## 2022-08-27 ENCOUNTER — Encounter (HOSPITAL_COMMUNITY): Payer: Self-pay | Admitting: *Deleted

## 2022-08-27 DIAGNOSIS — N3000 Acute cystitis without hematuria: Secondary | ICD-10-CM

## 2022-08-27 DIAGNOSIS — Z7982 Long term (current) use of aspirin: Secondary | ICD-10-CM | POA: Diagnosis not present

## 2022-08-27 DIAGNOSIS — R42 Dizziness and giddiness: Secondary | ICD-10-CM

## 2022-08-27 DIAGNOSIS — I2510 Atherosclerotic heart disease of native coronary artery without angina pectoris: Secondary | ICD-10-CM | POA: Diagnosis not present

## 2022-08-27 DIAGNOSIS — Z794 Long term (current) use of insulin: Secondary | ICD-10-CM | POA: Insufficient documentation

## 2022-08-27 DIAGNOSIS — R55 Syncope and collapse: Secondary | ICD-10-CM | POA: Diagnosis not present

## 2022-08-27 DIAGNOSIS — E119 Type 2 diabetes mellitus without complications: Secondary | ICD-10-CM | POA: Diagnosis not present

## 2022-08-27 DIAGNOSIS — Z85828 Personal history of other malignant neoplasm of skin: Secondary | ICD-10-CM | POA: Insufficient documentation

## 2022-08-27 DIAGNOSIS — Z7984 Long term (current) use of oral hypoglycemic drugs: Secondary | ICD-10-CM | POA: Diagnosis not present

## 2022-08-27 DIAGNOSIS — R531 Weakness: Secondary | ICD-10-CM | POA: Diagnosis not present

## 2022-08-27 LAB — CBC WITH DIFFERENTIAL/PLATELET
Abs Immature Granulocytes: 0.03 10*3/uL (ref 0.00–0.07)
Basophils Absolute: 0 10*3/uL (ref 0.0–0.1)
Basophils Relative: 0 %
Eosinophils Absolute: 0.1 10*3/uL (ref 0.0–0.5)
Eosinophils Relative: 1 %
HCT: 46.9 % (ref 39.0–52.0)
Hemoglobin: 15 g/dL (ref 13.0–17.0)
Immature Granulocytes: 0 %
Lymphocytes Relative: 17 %
Lymphs Abs: 2.1 10*3/uL (ref 0.7–4.0)
MCH: 27.1 pg (ref 26.0–34.0)
MCHC: 32 g/dL (ref 30.0–36.0)
MCV: 84.8 fL (ref 80.0–100.0)
Monocytes Absolute: 0.9 10*3/uL (ref 0.1–1.0)
Monocytes Relative: 8 %
Neutro Abs: 9.1 10*3/uL — ABNORMAL HIGH (ref 1.7–7.7)
Neutrophils Relative %: 74 %
Platelets: 267 10*3/uL (ref 150–400)
RBC: 5.53 MIL/uL (ref 4.22–5.81)
RDW: 14.6 % (ref 11.5–15.5)
WBC: 12.2 10*3/uL — ABNORMAL HIGH (ref 4.0–10.5)
nRBC: 0 % (ref 0.0–0.2)

## 2022-08-27 LAB — URINALYSIS, ROUTINE W REFLEX MICROSCOPIC
Bilirubin Urine: NEGATIVE
Glucose, UA: NEGATIVE mg/dL
Hgb urine dipstick: NEGATIVE
Ketones, ur: 5 mg/dL — AB
Nitrite: POSITIVE — AB
Protein, ur: 30 mg/dL — AB
Specific Gravity, Urine: 1.019 (ref 1.005–1.030)
pH: 7 (ref 5.0–8.0)

## 2022-08-27 LAB — LIPASE, BLOOD: Lipase: 36 U/L (ref 11–51)

## 2022-08-27 LAB — COMPREHENSIVE METABOLIC PANEL
ALT: 29 U/L (ref 0–44)
AST: 32 U/L (ref 15–41)
Albumin: 4 g/dL (ref 3.5–5.0)
Alkaline Phosphatase: 75 U/L (ref 38–126)
Anion gap: 11 (ref 5–15)
BUN: 17 mg/dL (ref 8–23)
CO2: 26 mmol/L (ref 22–32)
Calcium: 10 mg/dL (ref 8.9–10.3)
Chloride: 100 mmol/L (ref 98–111)
Creatinine, Ser: 0.91 mg/dL (ref 0.61–1.24)
GFR, Estimated: >60 mL/min (ref >60)
Glucose, Bld: 86 mg/dL (ref 70–99)
Potassium: 3.8 mmol/L (ref 3.5–5.1)
Sodium: 137 mmol/L (ref 135–145)
Total Bilirubin: 0.9 mg/dL (ref 0.3–1.2)
Total Protein: 7.6 g/dL (ref 6.5–8.1)

## 2022-08-27 LAB — CBG MONITORING, ED: Glucose-Capillary: 77 mg/dL (ref 70–99)

## 2022-08-27 MED ORDER — CEPHALEXIN 500 MG PO CAPS: 500.0000 mg | ORAL_CAPSULE | Freq: Two times a day (BID) | ORAL | 0 refills | Status: AC

## 2022-08-27 MED ORDER — ONDANSETRON HCL 4 MG/2ML IJ SOLN
4.0000 mg | Freq: Once | INTRAMUSCULAR | Status: AC
  Administered 2022-08-27: 4 mg via INTRAVENOUS
  Filled 2022-08-27: qty 2

## 2022-08-27 MED ORDER — MECLIZINE HCL 25 MG PO TABS: 25.0000 mg | ORAL_TABLET | Freq: Three times a day (TID) | ORAL | 0 refills | Status: DC | PRN

## 2022-08-27 MED ORDER — SODIUM CHLORIDE 0.9 % IV BOLUS
500.0000 mL | Freq: Once | INTRAVENOUS | Status: AC
  Administered 2022-08-27: 500 mL via INTRAVENOUS

## 2022-08-27 MED ORDER — CEPHALEXIN 500 MG PO CAPS
500.0000 mg | ORAL_CAPSULE | Freq: Once | ORAL | Status: AC
  Administered 2022-08-27: 500 mg via ORAL
  Filled 2022-08-27: qty 1

## 2022-08-27 MED ORDER — MECLIZINE HCL 12.5 MG PO TABS
25.0000 mg | ORAL_TABLET | Freq: Once | ORAL | Status: AC
  Administered 2022-08-27: 25 mg via ORAL
  Filled 2022-08-27: qty 2

## 2022-08-27 MED ORDER — DIAZEPAM 2 MG PO TABS: 2.0000 mg | ORAL_TABLET | Freq: Two times a day (BID) | ORAL | 0 refills | Status: DC | PRN

## 2022-08-27 MED ORDER — DIAZEPAM 2 MG PO TABS
2.0000 mg | ORAL_TABLET | Freq: Once | ORAL | Status: AC
  Administered 2022-08-27: 2 mg via ORAL
  Filled 2022-08-27: qty 1

## 2022-08-27 MED ORDER — DEXAMETHASONE SODIUM PHOSPHATE 10 MG/ML IJ SOLN
10.0000 mg | Freq: Once | INTRAMUSCULAR | Status: AC
  Administered 2022-08-27: 10 mg via INTRAVENOUS
  Filled 2022-08-27: qty 1

## 2022-08-27 NOTE — ED Provider Notes (Signed)
[facility] AT [facility] Provider Note   CSN: [id number] Arrival date & time: 08/27/22  8295     History {Add pertinent medical, surgical, social history, OB history to HPI:1} Chief Complaint  Patient presents with   Dizziness    [name] [name] is a 82 y.o. male with history including diabetes, CAD, hyperlipidemia, GERD kidney stones and basal cell carcinoma who has undergone 2 excisions for basal cell on his left ear and nose recently presenting with a 3-day history of dizziness which is followed by nausea and vomiting.  He describes room spinning vertigo like symptoms triggered by any significant movement of his head and positional changes.  With the symptoms he very quickly gets nauseated and vomits.  He denies weakness, fevers or chills, also denies chest pain, shortness of breath, headache or focal weakness.  He endorses having similar previous symptoms, was seen at [facility] for this years ago, states he was put on a vertigo medicine which resolved the symptoms.  Of note, he does appear to have a UTI per labs below, he does endorse history of UTIs but typically has burning with urination which he denies today.      The history is provided by the patient.       Home Medications Prior to Admission medications   Medication Sig Start Date End Date Taking? Authorizing Provider  acetaminophen (TYLENOL) 325 MG tablet Take 2 tablets (650 mg total) by mouth every 4 (four) hours as needed for mild pain or headache. 09/07/18  Yes Angiulli, Mcarthur Rossetti, PA-C  amLODipine (NORVASC) 5 MG tablet TAKE 1 TABLET EVERY DAY FOR BLOOD PRESSURE Patient taking differently: Take 5 mg by mouth daily. 06/17/22  Yes Mechele Claude, MD  aspirin EC 81 MG tablet Take 1 tablet (81 mg total) by mouth daily. 08/22/19  Yes Rollene Rotunda, MD  atorvastatin (LIPITOR) 80 MG tablet TAKE 1 TABLET EVERY DAY FOR CHOLESTEROL Patient taking differently: Take 80 mg by mouth daily. 06/17/22  Yes  Stacks, Broadus John, MD  cephALEXin (KEFLEX) 500 MG capsule Take 1 capsule (500 mg total) by mouth 2 (two) times daily for 7 days. 08/27/22 09/03/22 Yes Reggie Welge, Raynelle Fanning, PA-C  Cholecalciferol 125 MCG (5000 UT) capsule Take 5,000 Units by mouth. Take one tablet three times weekly    Yes [provider]  CINNAMON PO Take by mouth 2 (two) times daily.    Yes [provider]  diazepam (VALIUM) 2 MG tablet Take 1 tablet (2 mg total) by mouth every 12 (twelve) hours as needed (dizziness). 08/27/22  Yes Radwan Cowley, Raynelle Fanning, PA-C  fluticasone (FLONASE) 50 MCG/ACT nasal spray PLACE 2 SPRAYS INTO BOTH NOSTRILS DAILY AS NEEDED FOR ALLERGIES. 12/18/21  Yes Stacks, Broadus John, MD  Garlic 100 MG TABS Take by mouth 2 (two) times daily.    Yes [provider]  insulin degludec (TRESIBA FLEXTOUCH) 100 UNIT/ML FlexTouch Pen Inject 60 Units into the skin daily. 06/18/21  Yes Stacks, Broadus John, MD  levothyroxine (SYNTHROID) 75 MCG tablet TAKE 1 TABLET EVERY DAY FOR THYROID Patient taking differently: Take 75 mcg by mouth daily before breakfast. 06/17/22  Yes Mechele Claude, MD  meclizine (ANTIVERT) 25 MG tablet Take 1 tablet (25 mg total) by mouth 3 (three) times daily as needed for dizziness. 08/27/22  Yes Merrillyn Ackerley, Raynelle Fanning, PA-C  metFORMIN (GLUCOPHAGE) 1000 MG tablet TAKE 1 TABLET TWICE DAILY WITH MEALS FOR DIABETES Patient taking differently: Take 1,000 mg by mouth 2 (two) times daily with a meal. 06/17/22  Yes  Mechele Claude, MD  metoprolol tartrate (LOPRESSOR) 50 MG tablet TAKE 1/2 TABLET EVERY DAY FOR BLOOD PRESSURE AND HEART Patient taking differently: Take 25 mg by mouth daily. 06/17/22  Yes Stacks, Broadus John, MD  Semaglutide, 1 MG/DOSE, (OZEMPIC, 1 MG/DOSE,) 4 MG/3ML SOPN Inject 1 mg into the skin once a week. 03/05/21  Yes Mechele Claude, MD  Accu-Chek FastClix Lancets MISC Check BS TID Dx E11.9 03/19/19   Mechele Claude, MD  Blood Glucose Monitoring Suppl (ACCU-CHEK AVIVA PLUS) w/Device KIT Check BS TID Dx E11.9 03/19/19    Mechele Claude, MD  glucose blood (ACCU-CHEK AVIVA PLUS) test strip Check BS TID Dx E11.9 03/19/19   Mechele Claude, MD  irbesartan (AVAPRO) 150 MG tablet TAKE 1 TABLET EVERY DAY FOR BLOOD PRESSURE Patient taking differently: Take 150 mg by mouth daily. TAKE 1 TABLET EVERY DAY FOR BLOOD PRESSURE 06/17/22   Mechele Claude, MD      Allergies    Actos [pioglitazone hydrochloride], Lisinopril, Cymbalta [duloxetine hcl], Invokana [canagliflozin], Niaspan [niacin er], and Ramipril    Review of Systems   Review of Systems  Constitutional:  Negative for chills and fever.  HENT:  Negative for congestion.   Eyes: Negative.   Respiratory:  Negative for chest tightness and shortness of breath.   Cardiovascular:  Negative for chest pain.  Gastrointestinal:  Positive for nausea and vomiting. Negative for abdominal pain.  Genitourinary: Negative.   Musculoskeletal:  Negative for arthralgias, joint swelling and neck pain.  Skin: Negative.  Negative for rash and wound.  Neurological:  Positive for dizziness. Negative for syncope, speech difficulty, weakness, light-headedness, numbness and headaches.  Psychiatric/Behavioral: Negative.      Physical Exam Updated Vital Signs BP 138/75   Pulse 71   Temp 97.6 F (36.4 C) (Oral)   Resp 15   Ht 5\' 10"  (1.778 m)   Wt 72.6 kg   SpO2 96%   BMI 22.96 kg/m  Physical Exam Vitals and nursing note reviewed.  Constitutional:      Appearance: He is well-developed.  HENT:     Head: Normocephalic and atraumatic.  Eyes:     General: No visual field deficit.    Extraocular Movements:     Right eye: Nystagmus present.     Conjunctiva/sclera: Conjunctivae normal.     Comments: 2 beat right lateral nystagmus.  Cardiovascular:     Rate and Rhythm: Normal rate and regular rhythm.     Heart sounds: Normal heart sounds.  Pulmonary:     Effort: Pulmonary effort is normal.     Breath sounds: Normal breath sounds. No wheezing.  Abdominal:     General: Bowel  sounds are normal.     Palpations: Abdomen is soft.     Tenderness: There is no abdominal tenderness.  Musculoskeletal:        General: Normal range of motion.     Cervical back: Normal range of motion.  Skin:    General: Skin is warm and dry.  Neurological:     General: No focal deficit present.     Mental Status: He is alert and oriented to person, place, and time.     Cranial Nerves: No cranial nerve deficit or dysarthria.     Sensory: No sensory deficit.     Motor: No weakness.     ED Results / Procedures / Treatments   Labs (all labs ordered are listed, but only abnormal results are displayed) Labs Reviewed  URINALYSIS, ROUTINE W REFLEX MICROSCOPIC - Abnormal; Notable for  the following components:      Result Value   Color, Urine AMBER (*)    APPearance CLOUDY (*)    Ketones, ur 5 (*)    Protein, ur 30 (*)    Nitrite POSITIVE (*)    Leukocytes,Ua MODERATE (*)    Bacteria, UA MANY (*)    All other components within normal limits  CBC WITH DIFFERENTIAL/PLATELET - Abnormal; Notable for the following components:   WBC 12.2 (*)    Neutro Abs 9.1 (*)    All other components within normal limits  COMPREHENSIVE METABOLIC PANEL  LIPASE, BLOOD  CBG MONITORING, ED  CBG MONITORING, ED    EKG None  Radiology CT Head Wo Contrast  Result Date: 08/27/2022 CLINICAL DATA:  Syncope.  Dizziness with vomiting. EXAM: CT HEAD WITHOUT CONTRAST TECHNIQUE: Contiguous axial images were obtained from the base of the skull through the vertex without intravenous contrast. RADIATION DOSE REDUCTION: This exam was performed according to the departmental dose-optimization program which includes automated exposure control, adjustment of the mA and/or kV according to patient size and/or use of iterative reconstruction technique. COMPARISON:  08/21/2018 FINDINGS: Brain: There is no evidence for acute hemorrhage, hydrocephalus, mass lesion, or abnormal extra-axial fluid collection. No definite CT  evidence for acute infarction. Patchy low attenuation in the deep hemispheric and periventricular white matter is nonspecific, but likely reflects chronic microvascular ischemic demyelination. Vascular: No hyperdense vessel or unexpected calcification. Skull: No evidence for fracture. No worrisome lytic or sclerotic lesion. Sinuses/Orbits: Chronic mucosal disease posterior right ethmoid air cells and right sphenoid sinus. Partial mastoid opacification bilaterally. Other: None. IMPRESSION: 1. No acute intracranial abnormality. 2. Chronic small vessel ischemic disease. 3. Chronic paranasal sinus disease. Electronically Signed   By: Kennith Center M.D.   On: 08/27/2022 13:13   DG Chest Portable 1 View  Result Date: 08/27/2022 CLINICAL DATA:  Weakness. EXAM: PORTABLE CHEST 1 VIEW COMPARISON:  None Available. FINDINGS: Normal cardiac silhouette. Low lung volumes. No effusion, infiltrate, pneumothorax. No acute osseous abnormality. IMPRESSION: Low lung volumes. No acute cardiopulmonary process. Electronically Signed   By: Genevive Bi M.D.   On: 08/27/2022 10:37    Procedures Procedures  {Document cardiac monitor, telemetry assessment procedure when appropriate:1}  Medications Ordered in ED Medications  sodium chloride 0.9 % bolus 500 mL (0 mLs Intravenous Stopped 08/27/22 1245)  ondansetron (ZOFRAN) injection 4 mg (4 mg Intravenous Given 08/27/22 1032)  meclizine (ANTIVERT) tablet 25 mg (25 mg Oral Given 08/27/22 1032)  dexamethasone (DECADRON) injection 10 mg (10 mg Intravenous Given 08/27/22 1629)  cephALEXin (KEFLEX) capsule 500 mg (500 mg Oral Given 08/27/22 1629)  diazepam (VALIUM) tablet 2 mg (2 mg Oral Given 08/27/22 1629)    ED Course/ Medical Decision Making/ A&P   {   Click here for ABCD2, HEART and other calculatorsREFRESH Note before signing :1}                          Medical Decision Making Amount and/or Complexity of Data Reviewed Labs: ordered. Radiology:  ordered.  Risk Prescription drug management.     {Document critical care time when appropriate:1} {Document review of labs and clinical decision tools ie heart score, Chads2Vasc2 etc:1}  {Document your independent review of radiology images, and any outside records:1} {Document your discussion with family members, caretakers, and with consultants:1} {Document social determinants of health affecting pt's care:1} {Document your decision making why or why not admission, treatments were needed:1} Final Clinical Impression(s) /  ED Diagnoses Final diagnoses:  Vertigo  Acute cystitis without hematuria    Rx / DC Orders ED Discharge Orders          Ordered    diazepam (VALIUM) 2 MG tablet  Every 12 hours PRN        08/27/22 1723    meclizine (ANTIVERT) 25 MG tablet  3 times daily PRN        08/27/22 1723    cephALEXin (KEFLEX) 500 MG capsule  2 times daily        08/27/22 1728

## 2022-08-27 NOTE — ED Triage Notes (Signed)
Pt c/o dizziness and vomiting x 3 days. Denies weakness. Pt reports his blood sugar was 75 this morning and he was unable to eat/drink so he did not take his diabetic medications.

## 2022-08-27 NOTE — Discharge Instructions (Addendum)
Take the medications prescribed, the meclizine and the diazepam will help with your dizzy symptoms.  Minimize positional changes as we discussed to help minimize symptoms as this is hopefully resolving itself.  Do not drive within 4 hours of taking these medications as they can make you sleepy and use caution even around the house with ambulation.  Plan to see your doctor for recheck next week if your symptoms are not improved or gone.  You do have a urinary tract infection take your next dose of Keflex tomorrow morning.  Take your routine other home medications this evening.  Your blood glucose level is low normal, I would not take your diabetes medications this evening but I would make sure you eat a meal since your blood sugar does not drop overnight.

## 2022-09-02 DIAGNOSIS — D0439 Carcinoma in situ of skin of other parts of face: Secondary | ICD-10-CM | POA: Diagnosis not present

## 2022-09-02 DIAGNOSIS — L578 Other skin changes due to chronic exposure to nonionizing radiation: Secondary | ICD-10-CM | POA: Diagnosis not present

## 2022-09-02 DIAGNOSIS — L570 Actinic keratosis: Secondary | ICD-10-CM | POA: Diagnosis not present

## 2022-09-02 DIAGNOSIS — C4442 Squamous cell carcinoma of skin of scalp and neck: Secondary | ICD-10-CM | POA: Diagnosis not present

## 2022-09-09 DIAGNOSIS — L578 Other skin changes due to chronic exposure to nonionizing radiation: Secondary | ICD-10-CM | POA: Diagnosis not present

## 2022-09-09 DIAGNOSIS — L244 Irritant contact dermatitis due to drugs in contact with skin: Secondary | ICD-10-CM | POA: Diagnosis not present

## 2022-09-15 ENCOUNTER — Other Ambulatory Visit: Payer: Self-pay | Admitting: Family Medicine

## 2022-09-15 ENCOUNTER — Ambulatory Visit (INDEPENDENT_AMBULATORY_CARE_PROVIDER_SITE_OTHER): Payer: Medicare HMO | Admitting: Family Medicine

## 2022-09-15 ENCOUNTER — Encounter: Payer: Self-pay | Admitting: Family Medicine

## 2022-09-15 VITALS — BP 117/64 | HR 69 | Temp 97.3°F | Ht 70.0 in | Wt 165.4 lb

## 2022-09-15 DIAGNOSIS — N3000 Acute cystitis without hematuria: Secondary | ICD-10-CM

## 2022-09-15 DIAGNOSIS — E782 Mixed hyperlipidemia: Secondary | ICD-10-CM | POA: Diagnosis not present

## 2022-09-15 DIAGNOSIS — I10 Essential (primary) hypertension: Secondary | ICD-10-CM

## 2022-09-15 DIAGNOSIS — E119 Type 2 diabetes mellitus without complications: Secondary | ICD-10-CM | POA: Diagnosis not present

## 2022-09-15 DIAGNOSIS — E034 Atrophy of thyroid (acquired): Secondary | ICD-10-CM

## 2022-09-15 DIAGNOSIS — Z794 Long term (current) use of insulin: Secondary | ICD-10-CM | POA: Diagnosis not present

## 2022-09-15 LAB — CBC WITH DIFFERENTIAL/PLATELET
Immature Grans (Abs): 0 10*3/uL (ref 0.0–0.1)
Immature Granulocytes: 0 %
Lymphs: 22 %
MCHC: 32.3 g/dL (ref 31.5–35.7)
Monocytes Absolute: 0.6 10*3/uL (ref 0.1–0.9)
Neutrophils: 67 %
RBC: 4.87 x10E6/uL (ref 4.14–5.80)
RDW: 13.9 % (ref 11.6–15.4)

## 2022-09-15 LAB — MICROSCOPIC EXAMINATION

## 2022-09-15 LAB — CMP14+EGFR

## 2022-09-15 LAB — URINALYSIS, COMPLETE
Bilirubin, UA: NEGATIVE
Glucose, UA: NEGATIVE
Ketones, UA: NEGATIVE
Nitrite, UA: POSITIVE — AB
Protein,UA: NEGATIVE
RBC, UA: NEGATIVE
Specific Gravity, UA: 1.015 (ref 1.005–1.030)
Urobilinogen, Ur: 0.2 mg/dL (ref 0.2–1.0)
pH, UA: 6 (ref 5.0–7.5)

## 2022-09-15 LAB — LIPID PANEL

## 2022-09-15 LAB — TSH+FREE T4

## 2022-09-15 LAB — BAYER DCA HB A1C WAIVED: HB A1C (BAYER DCA - WAIVED): 7.3 % — ABNORMAL HIGH (ref 4.8–5.6)

## 2022-09-15 MED ORDER — CIPROFLOXACIN HCL 500 MG PO TABS: 500.0000 mg | ORAL_TABLET | Freq: Two times a day (BID) | ORAL | 0 refills | Status: DC

## 2022-09-15 NOTE — Progress Notes (Signed)
Subjective:  Patient ID: [name] [name],  male    DOB: [birth year]  Age: 82 y.o.    CC: Medical Management of Chronic Issues   HPI [name] [name] presents for  follow-up of hypertension. Patient has no history of headache chest pain or shortness of breath or recent cough. Patient also denies symptoms of TIA such as numbness weakness lateralizing. Patient denies side effects from medication. States taking it regularly.  Patient also  in for follow-up of elevated cholesterol. Doing well without complaints on current medication. Denies side effects  including myalgia and arthralgia and nausea. Also in today for liver function testing. Currently no chest pain, shortness of breath or other cardiovascular related symptoms noted.  Follow-up of diabetes. Patient does check blood sugar at home. Readings run a little higher lately due to lack of exercise and a recent UTI Patient denies symptoms such as excessive hunger or urinary frequency, excessive hunger, nausea No significant hypoglycemic spells noted. Medications reviewed. Pt reports taking them regularly. Pt. denies complication/adverse reaction today.    History [name] [name] has a past medical history of Basal cell carcinoma, CAD (coronary artery disease), Cardiac arrest (HCC), Cataract, Diabetes mellitus without complication (HCC), Diverticulitis, Erectile dysfunction, Gastroesophageal reflux disease with hiatal hernia, Goiter, nontoxic, multinodular, Hiatal hernia, Hyperlipidemia, Inguinal hernia, Kidney stone (1980), Leg fracture, Liver hemangioma, Nephrolithiasis, and Vitamin D deficiency.   [name] [name] has a past surgical history that includes LEFT HEART CATH AND CORONARY ANGIOGRAPHY (N/A, 08/25/2018); CORONARY STENT INTERVENTION (N/A, 08/25/2018); Laceration repair (2008); Back surgery (1984); Tibia fracture surgery; Cataract extraction (Bilateral); Eye surgery; Adjacent tissue transfer/tissue rearrangement (Left, 06/20/2020); Full thickness skin graft (Left,  06/20/2020); and skin cancer removal.   His family history includes Cancer in his father; Diabetes in an other family member; Early death (age of onset: 3) in his brother; Heart attack in his sister; Heart disease in his brother; Heart disease (age of onset: 19) in his mother.[name] [name] reports that [name] [name] quit smoking about 39 years ago. His smoking use included cigarettes. [name] [name] has a 27.00 pack-year smoking history. [name] [name] has never used smokeless tobacco. [name] [name] reports that [name] [name] does not drink alcohol and does not use drugs.  Current Outpatient Medications on File Prior to Visit  Medication Sig Dispense Refill   Accu-Chek FastClix Lancets MISC Check BS TID Dx E11.9 300 each 3   acetaminophen (TYLENOL) 325 MG tablet Take 2 tablets (650 mg total) by mouth every 4 (four) hours as needed for mild pain or headache.     amLODipine (NORVASC) 5 MG tablet TAKE 1 TABLET EVERY DAY FOR BLOOD PRESSURE (Patient taking differently: Take 5 mg by mouth daily.) 90 tablet 3   aspirin EC 81 MG tablet Take 1 tablet (81 mg total) by mouth daily. 90 tablet 3   atorvastatin (LIPITOR) 80 MG tablet TAKE 1 TABLET EVERY DAY FOR CHOLESTEROL (Patient taking differently: Take 80 mg by mouth daily.) 90 tablet 3   Blood Glucose Monitoring Suppl (ACCU-CHEK AVIVA PLUS) w/Device KIT Check BS TID Dx E11.9 1 kit 0   Cholecalciferol 125 MCG (5000 UT) capsule Take 5,000 Units by mouth. Take one tablet three times weekly      CINNAMON PO Take by mouth 2 (two) times daily.      diazepam (VALIUM) 2 MG tablet Take 1 tablet (2 mg total) by mouth every 12 (twelve) hours as needed (dizziness). 30 tablet 0   fluticasone (FLONASE) 50 MCG/ACT nasal spray PLACE 2 SPRAYS INTO BOTH NOSTRILS DAILY AS NEEDED FOR ALLERGIES.  16 g 3   Garlic 100 MG TABS Take by mouth 2 (two) times daily.      glucose blood (ACCU-CHEK AVIVA PLUS) test strip Check BS TID Dx E11.9 300 each 3   insulin degludec (TRESIBA FLEXTOUCH) 100 UNIT/ML FlexTouch Pen Inject 60 Units into the skin daily. 15  mL 3   irbesartan (AVAPRO) 150 MG tablet TAKE 1 TABLET EVERY DAY FOR BLOOD PRESSURE (Patient taking differently: Take 150 mg by mouth daily. TAKE 1 TABLET EVERY DAY FOR BLOOD PRESSURE) 90 tablet 3   levothyroxine (SYNTHROID) 75 MCG tablet TAKE 1 TABLET EVERY DAY FOR THYROID (Patient taking differently: Take 75 mcg by mouth daily before breakfast.) 90 tablet 3   meclizine (ANTIVERT) 25 MG tablet Take 1 tablet (25 mg total) by mouth 3 (three) times daily as needed for dizziness. 30 tablet 0   metFORMIN (GLUCOPHAGE) 1000 MG tablet TAKE 1 TABLET TWICE DAILY WITH MEALS FOR DIABETES (Patient taking differently: Take 1,000 mg by mouth 2 (two) times daily with a meal.) 180 tablet 3   metoprolol tartrate (LOPRESSOR) 50 MG tablet TAKE 1/2 TABLET EVERY DAY FOR BLOOD PRESSURE AND HEART (Patient taking differently: Take 25 mg by mouth daily.) 45 tablet 3   Semaglutide, 1 MG/DOSE, (OZEMPIC, 1 MG/DOSE,) 4 MG/3ML SOPN Inject 1 mg into the skin once a week. 9 mL 1   No current facility-administered medications on file prior to visit.    ROS Review of Systems  Constitutional:  Negative for fever.  Respiratory:  Negative for shortness of breath.   Cardiovascular:  Negative for chest pain.  Musculoskeletal:  Negative for arthralgias.  Skin:  Negative for rash.  Neurological:  Positive for dizziness (still a little staggery at  times).    Objective:  BP 117/64   Pulse 69   Temp (!) 97.3 F (36.3 C)   Ht 5\' 10"  (1.778 m)   Wt 165 lb 6.4 oz (75 kg)   SpO2 98%   BMI 23.73 kg/m   BP Readings from Last 3 Encounters:  09/15/22 117/64  08/27/22 138/75  06/17/22 (!) 149/66    Wt Readings from Last 3 Encounters:  09/15/22 165 lb 6.4 oz (75 kg)  08/27/22 160 lb (72.6 kg)  06/17/22 164 lb (74.4 kg)     Physical Exam Vitals reviewed.  Constitutional:      Appearance: [name] [name] is well-developed.  HENT:     Head: Normocephalic and atraumatic.     Right Ear: External ear normal.     Left Ear: External ear  normal.     Mouth/Throat:     Pharynx: No oropharyngeal exudate or posterior oropharyngeal erythema.  Eyes:     Pupils: Pupils are equal, round, and reactive to light.  Cardiovascular:     Rate and Rhythm: Normal rate and regular rhythm.     Heart sounds: No murmur heard. Pulmonary:     Effort: No respiratory distress.     Breath sounds: Normal breath sounds.  Musculoskeletal:     Cervical back: Normal range of motion and neck supple.  Neurological:     General: No focal deficit present.     Mental Status: [name] [name] is alert and oriented to person, place, and time.     Cranial Nerves: No cranial nerve deficit.     Motor: No weakness.     Coordination: Coordination normal.     Gait: Gait normal.  Psychiatric:        Mood and Affect: Mood normal.  Behavior: Behavior normal.        Thought Content: Thought content normal.     Diabetic Foot Exam - Simple   No data filed     Lab Results  Component Value Date   HGBA1C 7.9 (H) 06/17/2022   HGBA1C 7.4 (H) 03/15/2022   HGBA1C 6.5 (H) 12/08/2021    Assessment & Plan:   Arjan was seen today for medical management of chronic issues.  Diagnoses and all orders for this visit:  Type 2 diabetes mellitus treated with insulin (HCC) -     Bayer DCA Hb A1c Waived  Mixed hyperlipidemia -     Lipid panel  Essential hypertension -     CBC with Differential/Platelet -     CMP14+EGFR  Hypothyroidism due to acquired atrophy of thyroid -     TSH + free T4  Acute cystitis without hematuria -     Urinalysis, Complete   I am having Jillian F. Roseanne Reno maintain his acetaminophen, Cholecalciferol, Garlic, Accu-Chek Aviva Plus, Accu-Chek Aviva Plus, Accu-Chek FastClix Lancets, CINNAMON PO, aspirin EC, Ozempic (1 MG/DOSE), Tresiba FlexTouch, fluticasone, amLODipine, atorvastatin, irbesartan, levothyroxine, metFORMIN, metoprolol tartrate, diazepam, and meclizine.  No orders of the defined types were placed in this  encounter.    Follow-up: Return in about 3 months (around 12/16/2022).  Mechele Claude, M.D.

## 2022-09-16 LAB — CBC WITH DIFFERENTIAL/PLATELET
Basophils Absolute: 0.1 10*3/uL (ref 0.0–0.2)
Basos: 1 %
EOS (ABSOLUTE): 0.2 10*3/uL (ref 0.0–0.4)
Eos: 2 %
Hematocrit: 41.5 % (ref 37.5–51.0)
Hemoglobin: 13.4 g/dL (ref 13.0–17.7)
Lymphocytes Absolute: 1.7 10*3/uL (ref 0.7–3.1)
MCH: 27.5 pg (ref 26.6–33.0)
MCV: 85 fL (ref 79–97)
Monocytes: 8 %
Neutrophils Absolute: 5.1 10*3/uL (ref 1.4–7.0)
Platelets: 226 10*3/uL (ref 150–450)
WBC: 7.8 10*3/uL (ref 3.4–10.8)

## 2022-09-16 LAB — LIPID PANEL
Chol/HDL Ratio: 1.8 ratio (ref 0.0–5.0)
Cholesterol, Total: 66 mg/dL — ABNORMAL LOW (ref 100–199)
HDL: 37 mg/dL — ABNORMAL LOW (ref >39)
Triglycerides: 35 mg/dL (ref 0–149)
VLDL Cholesterol Cal: 11 mg/dL (ref 5–40)

## 2022-09-16 LAB — CMP14+EGFR
Albumin/Globulin Ratio: 2.1 (ref 1.2–2.2)
Albumin: 4.2 g/dL (ref 3.7–4.7)
Alkaline Phosphatase: 87 IU/L (ref 44–121)
Bilirubin Total: 0.3 mg/dL (ref 0.0–1.2)
CO2: 24 mmol/L (ref 20–29)
Calcium: 9.7 mg/dL (ref 8.6–10.2)
Chloride: 105 mmol/L (ref 96–106)
Creatinine, Ser: 0.85 mg/dL (ref 0.76–1.27)
Glucose: 60 mg/dL — ABNORMAL LOW (ref 70–99)
Potassium: 4.2 mmol/L (ref 3.5–5.2)
Total Protein: 6.2 g/dL (ref 6.0–8.5)

## 2022-09-16 LAB — TSH+FREE T4: TSH: 1.76 u[IU]/mL (ref 0.450–4.500)

## 2022-09-16 NOTE — Progress Notes (Signed)
Hello Brenen,  Your lab result is normal and/or stable.Some minor variations that are not significant are commonly marked abnormal, but do not represent any medical problem for you.  Best regards, Jondavid Schreier, M.D.

## 2022-09-22 DIAGNOSIS — L244 Irritant contact dermatitis due to drugs in contact with skin: Secondary | ICD-10-CM | POA: Diagnosis not present

## 2022-09-22 DIAGNOSIS — L578 Other skin changes due to chronic exposure to nonionizing radiation: Secondary | ICD-10-CM | POA: Diagnosis not present

## 2022-09-22 DIAGNOSIS — C44329 Squamous cell carcinoma of skin of other parts of face: Secondary | ICD-10-CM | POA: Diagnosis not present

## 2022-09-22 DIAGNOSIS — D485 Neoplasm of uncertain behavior of skin: Secondary | ICD-10-CM | POA: Diagnosis not present

## 2022-10-07 ENCOUNTER — Other Ambulatory Visit: Payer: Self-pay | Admitting: Pharmacist

## 2022-10-07 NOTE — Progress Notes (Signed)
10/07/2022 Name: [name] [name] MRN: [id number] DOB: [birth year]  Chief Complaint  Patient presents with   Medication Assistance    Patient reports adherence with medications. Current diabetes medications include: Tresiba, Ozempic, metformin Current hypertension medications include: irbesartan, metoprolol, amlodipine Goal 130/80 Current hyperlipidemia medications include: atorvastatin LDL 31 on 2023 STATIN ALLERGY/INTOLERANCE DOCUMENTED IN EMR n/a   Patient denies hypoglycemic events.   Patient reported dietary habits: Eats 3 meals/day   Patient-reported exercise habits: n/a     A/P:   Recommendations/Changes made from today's visit:   Diabetes: Patient now Controlled with the aid of patient assistance programs  A1C 6.5%-->7.4%  Current treatment: OZEMPIC 1MG  SQ WEEKLY WEDNESDAYS,  Tresiba 60 UNITS EACH MORNING, METFORMIN 1 G TWICE DAILY WITH MEALS-GFR 91;  Past meds: Levemir; NOTED SGTL2 INTOLERANCE(invokana) CONTINUE OZEMPIC 1MG  SQ WEEKLY Denies personal and family history of Medullary thyroid cancer (MTC) TOLERATING WELL; DENIES SIDE EFFECTS SHIPMENT arrived today FROM [employer] [facility] (Ozempic only) Current glucose readings: fasting glucose: 90-145, post prandial glucose: <190 Denies hypoglycemic/hyperglycemic symptoms Discussed meal planning options and Plate method for healthy eating Avoid sugary drinks and desserts Incorporate balanced protein, non starchy veggies, 1 serving of carbohydrate with each meal Increase water intake Increase physical activity as able Current exercise: N/A Recommended -- continue current therapy Assessed patient finances. re-enrolled for 2024 with [employer] PAP for [place] and Ozempic--Ozempic here for pick up today DISCUSSED HYPERLIPIDEMIA/COMPLIANCE WITH MEDICATIONS-LDL 31-->18   -Extensively discussed pathophysiology of diabetes, recommended lifestyle interventions, dietary effects on blood sugar control   -Counseled on s/sx of  and management of hypoglycemia   -Next A1C anticipated 3-6 months.   Medication Access/Adherence  Patient reports affordability concerns with their medications: Yes  Patient reports access/transportation concerns to their pharmacy: No  Patient reports adherence concerns with their medications:  No  only when costly; enrolled in PAP  Objective:  Lab Results  Component Value Date   HGBA1C 7.3 (H) 09/15/2022    Lab Results  Component Value Date   CREATININE 0.85 09/15/2022   BUN 11 09/15/2022   NA 142 09/15/2022   K 4.2 09/15/2022   CL 105 09/15/2022   CO2 24 09/15/2022    Lab Results  Component Value Date   CHOL 66 (L) 09/15/2022   HDL 37 (L) 09/15/2022   LDLCALC 18 09/15/2022   TRIG 35 09/15/2022   CHOLHDL 1.8 09/15/2022    Medications Reviewed Today     Reviewed by Mechele Claude, MD (Physician) on 09/15/22 at [phone]  Med List Status: <None>   Medication Order Taking? Sig Documenting Provider Last Dose Status Informant  Accu-Chek FastClix Lancets MISC [id number] Yes Check BS TID Dx E11.9 Mechele Claude, MD Taking Active Self  acetaminophen (TYLENOL) 325 MG tablet [id number] Yes Take 2 tablets (650 mg total) by mouth every 4 (four) hours as needed for mild pain or headache. Charlton Amor, PA-C Taking Active Self  amLODipine (NORVASC) 5 MG tablet [id number] Yes TAKE 1 TABLET EVERY DAY FOR BLOOD PRESSURE  Patient taking differently: Take 5 mg by mouth daily.   Mechele Claude, MD Taking Active Self  aspirin EC 81 MG tablet [id number] Yes Take 1 tablet (81 mg total) by mouth daily. Rollene Rotunda, MD Taking Active Self  atorvastatin (LIPITOR) 80 MG tablet [id number] Yes TAKE 1 TABLET EVERY DAY FOR CHOLESTEROL  Patient taking differently: Take 80 mg by mouth daily.   Mechele Claude, MD Taking Active Self  Blood Glucose Monitoring Suppl (ACCU-CHEK AVIVA PLUS) w/Device KIT  [id number] Yes Check BS TID Dx E11.9 Mechele Claude, MD Taking Active Self  Cholecalciferol 125 MCG  (5000 UT) capsule [id number] Yes Take 5,000 Units by mouth. Take one tablet three times weekly  [provider] Taking Active Self  CINNAMON PO [id number] Yes Take by mouth 2 (two) times daily.  [provider] Taking Active Self  diazepam (VALIUM) 2 MG tablet [id number] Yes Take 1 tablet (2 mg total) by mouth every 12 (twelve) hours as needed (dizziness). Burgess Amor, PA-C Taking Active   fluticasone (FLONASE) 50 MCG/ACT nasal spray [id number] Yes PLACE 2 SPRAYS INTO BOTH NOSTRILS DAILY AS NEEDED FOR ALLERGIES. Mechele Claude, MD Taking Active Self  Garlic 100 MG TABS [id number] Yes Take by mouth 2 (two) times daily.  [provider] Taking Active Self  glucose blood (ACCU-CHEK AVIVA PLUS) test strip [id number] Yes Check BS TID Dx E11.9 Mechele Claude, MD Taking Active Self  insulin degludec (TRESIBA FLEXTOUCH) 100 UNIT/ML FlexTouch Pen [id number] Yes Inject 60 Units into the skin daily. Mechele Claude, MD Taking Active Self           Med Note Cresenciano Genre, Okey Dupre Jan 14, 2022  1:09 PM) Via [employer] patient assistance program    irbesartan (AVAPRO) 150 MG tablet [id number] Yes TAKE 1 TABLET EVERY DAY FOR BLOOD PRESSURE  Patient taking differently: Take 150 mg by mouth daily. TAKE 1 TABLET EVERY DAY FOR BLOOD PRESSURE   Stacks, Broadus John, MD Taking Active Self  levothyroxine (SYNTHROID) 75 MCG tablet [id number] Yes TAKE 1 TABLET EVERY DAY FOR THYROID  Patient taking differently: Take 75 mcg by mouth daily before breakfast.   Mechele Claude, MD Taking Active Self  meclizine (ANTIVERT) 25 MG tablet [id number] Yes Take 1 tablet (25 mg total) by mouth 3 (three) times daily as needed for dizziness. Burgess Amor, PA-C Taking Active   metFORMIN (GLUCOPHAGE) 1000 MG tablet [id number] Yes TAKE 1 TABLET TWICE DAILY WITH MEALS FOR DIABETES  Patient taking differently: Take 1,000 mg by mouth 2 (two) times daily with a meal.   Mechele Claude, MD Taking Active Self  metoprolol tartrate  (LOPRESSOR) 50 MG tablet [id number] Yes TAKE 1/2 TABLET EVERY DAY FOR BLOOD PRESSURE AND HEART  Patient taking differently: Take 25 mg by mouth daily.   Mechele Claude, MD Taking Active Self  Semaglutide, 1 MG/DOSE, (OZEMPIC, 1 MG/DOSE,) 4 MG/3ML SOPN [id number] Yes Inject 1 mg into the skin once a week. Mechele Claude, MD Taking Active Self           Med Note Donn Pierini Jan 14, 2022  1:09 PM) Via [employer] patient assistance program    Med List Note Mechele Claude, [place] 12/08/21 [phone]): Concerned med causing loss of appetite.             Follow Up Plan: PRN  Kieth Brightly, PharmD, BCACP Clinical Pharmacist, [facility]

## 2022-12-22 ENCOUNTER — Encounter: Payer: Self-pay | Admitting: Family Medicine

## 2022-12-22 ENCOUNTER — Ambulatory Visit (INDEPENDENT_AMBULATORY_CARE_PROVIDER_SITE_OTHER): Payer: Medicare HMO | Admitting: Family Medicine

## 2022-12-22 VITALS — BP 136/68 | HR 60 | Temp 97.0°F | Ht 70.0 in | Wt 161.8 lb

## 2022-12-22 DIAGNOSIS — E119 Type 2 diabetes mellitus without complications: Secondary | ICD-10-CM | POA: Diagnosis not present

## 2022-12-22 DIAGNOSIS — Z794 Long term (current) use of insulin: Secondary | ICD-10-CM | POA: Diagnosis not present

## 2022-12-22 DIAGNOSIS — E034 Atrophy of thyroid (acquired): Secondary | ICD-10-CM | POA: Diagnosis not present

## 2022-12-22 DIAGNOSIS — E782 Mixed hyperlipidemia: Secondary | ICD-10-CM | POA: Diagnosis not present

## 2022-12-22 DIAGNOSIS — I10 Essential (primary) hypertension: Secondary | ICD-10-CM

## 2022-12-22 LAB — BAYER DCA HB A1C WAIVED: HB A1C (BAYER DCA - WAIVED): 5.8 % — ABNORMAL HIGH (ref 4.8–5.6)

## 2022-12-22 NOTE — Progress Notes (Signed)
Subjective:  Patient ID: [name] [name],  male    DOB: Jun 03, 1940  Age: 82 y.o.    CC: Medical Management of Chronic Issues   HPI [name] [name] presents for  follow-up of hypertension. Patient has no history of headache chest pain or shortness of breath or recent cough. Patient also denies symptoms of TIA such as numbness weakness lateralizing. Patient denies side effects from medication. States taking it regularly.  Patient also  in for follow-up of elevated cholesterol. Doing well without complaints on current medication. Denies side effects  including myalgia and arthralgia and nausea. Also in today for liver function testing. Currently no chest pain, shortness of breath or other cardiovascular related symptoms noted.  Follow-up of diabetes. Patient does check blood sugar at home. Readings run between 90 and 125 Patient denies symptoms such as excessive hunger or urinary frequency, excessive hunger, nausea No significant hypoglycemic spells noted. Medications reviewed. Pt reports taking them regularly. Pt. denies complication/adverse reaction today.    follow-up on  thyroid. The patient has a history of hypothyroidism for many years. It has been stable recently. Pt. denies any change in  voice, loss of hair, heat or cold intolerance. Energy level has been adequate to good. Patient denies constipation and diarrhea. No myxedema. Medication is as noted below. Verified that pt is taking it daily on an empty stomach. Well tolerated.   History [name] [name] has a past medical history of Basal cell carcinoma, CAD (coronary artery disease), Cardiac arrest (HCC), Cataract, Diabetes mellitus without complication (HCC), Diverticulitis, Erectile dysfunction, Gastroesophageal reflux disease with hiatal hernia, Goiter, nontoxic, multinodular, Hiatal hernia, Hyperlipidemia, Inguinal hernia, Kidney stone (1980), Leg fracture, Liver hemangioma, Nephrolithiasis, and Vitamin D deficiency.   He has a past  surgical history that includes LEFT HEART CATH AND CORONARY ANGIOGRAPHY (N/A, 08/25/2018); CORONARY STENT INTERVENTION (N/A, 08/25/2018); Laceration repair (2008); Back surgery (1984); Tibia fracture surgery; Cataract extraction (Bilateral); Eye surgery; Adjacent tissue transfer/tissue rearrangement (Left, 06/20/2020); Full thickness skin graft (Left, 06/20/2020); and skin cancer removal.   His family history includes Cancer in his father; Diabetes in an other family member; Early death (age of onset: 3) in his brother; Heart attack in his sister; Heart disease in his brother; Heart disease (age of onset: 76) in his mother.He reports that he quit smoking about 39 years ago. His smoking use included cigarettes. He started smoking about 66 years ago. He has a 27 pack-year smoking history. He has never used smokeless tobacco. He reports that he does not drink alcohol and does not use drugs.  Current Outpatient Medications on File Prior to Visit  Medication Sig Dispense Refill   Accu-Chek FastClix Lancets MISC Check BS TID Dx E11.9 300 each 3   acetaminophen (TYLENOL) 325 MG tablet Take 2 tablets (650 mg total) by mouth every 4 (four) hours as needed for mild pain or headache.     amLODipine (NORVASC) 5 MG tablet TAKE 1 TABLET EVERY DAY FOR BLOOD PRESSURE (Patient taking differently: Take 5 mg by mouth daily.) 90 tablet 3   aspirin EC 81 MG tablet Take 1 tablet (81 mg total) by mouth daily. 90 tablet 3   atorvastatin (LIPITOR) 80 MG tablet TAKE 1 TABLET EVERY DAY FOR CHOLESTEROL (Patient taking differently: Take 80 mg by mouth daily.) 90 tablet 3   Blood Glucose Monitoring Suppl (ACCU-CHEK AVIVA PLUS) w/Device KIT Check BS TID Dx E11.9 1 kit 0   Cholecalciferol 125 MCG (5000 UT) capsule Take 5,000 Units by mouth. Take one  tablet three times weekly      CINNAMON PO Take by mouth 2 (two) times daily.      fluticasone (FLONASE) 50 MCG/ACT nasal spray PLACE 2 SPRAYS INTO BOTH NOSTRILS DAILY AS NEEDED FOR ALLERGIES. 16  g 3   Garlic 100 MG TABS Take by mouth 2 (two) times daily.      glucose blood (ACCU-CHEK AVIVA PLUS) test strip Check BS TID Dx E11.9 300 each 3   insulin degludec (TRESIBA FLEXTOUCH) 100 UNIT/ML FlexTouch Pen Inject 60 Units into the skin daily. 15 mL 3   irbesartan (AVAPRO) 150 MG tablet TAKE 1 TABLET EVERY DAY FOR BLOOD PRESSURE (Patient taking differently: Take 150 mg by mouth daily. TAKE 1 TABLET EVERY DAY FOR BLOOD PRESSURE) 90 tablet 3   levothyroxine (SYNTHROID) 75 MCG tablet TAKE 1 TABLET EVERY DAY FOR THYROID (Patient taking differently: Take 75 mcg by mouth daily before breakfast.) 90 tablet 3   metFORMIN (GLUCOPHAGE) 1000 MG tablet TAKE 1 TABLET TWICE DAILY WITH MEALS FOR DIABETES (Patient taking differently: Take 1,000 mg by mouth 2 (two) times daily with a meal.) 180 tablet 3   metoprolol tartrate (LOPRESSOR) 50 MG tablet TAKE 1/2 TABLET EVERY DAY FOR BLOOD PRESSURE AND HEART (Patient taking differently: Take 25 mg by mouth daily.) 45 tablet 3   Semaglutide, 1 MG/DOSE, (OZEMPIC, 1 MG/DOSE,) 4 MG/3ML SOPN Inject 1 mg into the skin once a week. 9 mL 1   No current facility-administered medications on file prior to visit.    ROS Review of Systems  Constitutional:  Negative for fever.  Respiratory:  Negative for shortness of breath.   Cardiovascular:  Negative for chest pain.  Musculoskeletal:  Negative for arthralgias.  Skin:  Negative for rash.    Objective:  BP 136/68   Pulse 60   Temp (!) 97 F (36.1 C)   Ht 5\' 10"  (1.778 m)   Wt 161 lb 12.8 oz (73.4 kg)   SpO2 98%   BMI 23.22 kg/m   BP Readings from Last 3 Encounters:  12/22/22 136/68  09/15/22 117/64  08/27/22 138/75    Wt Readings from Last 3 Encounters:  12/22/22 161 lb 12.8 oz (73.4 kg)  09/15/22 165 lb 6.4 oz (75 kg)  08/27/22 160 lb (72.6 kg)     Physical Exam Vitals reviewed.  Constitutional:      Appearance: He is well-developed.  HENT:     Head: Normocephalic and atraumatic.     Right Ear:  External ear normal.     Left Ear: External ear normal.     Mouth/Throat:     Pharynx: No oropharyngeal exudate or posterior oropharyngeal erythema.  Eyes:     Pupils: Pupils are equal, round, and reactive to light.  Cardiovascular:     Rate and Rhythm: Normal rate and regular rhythm.     Heart sounds: No murmur heard. Pulmonary:     Effort: No respiratory distress.     Breath sounds: Normal breath sounds.  Musculoskeletal:     Cervical back: Normal range of motion and neck supple.  Neurological:     Mental Status: He is alert and oriented to person, place, and time.     Diabetic Foot Exam - Simple   No data filed     Lab Results  Component Value Date   HGBA1C 7.3 (H) 09/15/2022   HGBA1C 7.9 (H) 06/17/2022   HGBA1C 7.4 (H) 03/15/2022    Assessment & Plan:   [name] [name] was seen today for  medical management of chronic issues.  Diagnoses and all orders for this visit:  Type 2 diabetes mellitus treated with insulin (HCC) -     Bayer DCA Hb A1c Waived  Mixed hyperlipidemia -     Lipid panel  Essential hypertension -     CBC with Differential/Platelet -     CMP14+EGFR  Hypothyroidism due to acquired atrophy of thyroid -     TSH + free T4   I have discontinued Obe F. Fowles's diazepam, meclizine, and ciprofloxacin. I am also having him maintain his acetaminophen, Cholecalciferol, Garlic, Accu-Chek Aviva Plus, Accu-Chek Aviva Plus, Accu-Chek FastClix Lancets, CINNAMON PO, aspirin EC, Ozempic (1 MG/DOSE), Tresiba FlexTouch, fluticasone, amLODipine, atorvastatin, irbesartan, levothyroxine, metFORMIN, and metoprolol tartrate.  No orders of the defined types were placed in this encounter.    Follow-up: Return in about 3 months (around 03/23/2023).  Mechele Claude, M.D.

## 2022-12-23 LAB — CMP14+EGFR
ALT: 52 IU/L — ABNORMAL HIGH (ref 0–44)
AST: 41 IU/L — ABNORMAL HIGH (ref 0–40)
Albumin: 4.2 g/dL (ref 3.7–4.7)
Alkaline Phosphatase: 89 IU/L (ref 44–121)
BUN/Creatinine Ratio: 12 (ref 10–24)
BUN: 9 mg/dL (ref 8–27)
Bilirubin Total: 0.3 mg/dL (ref 0.0–1.2)
CO2: 24 mmol/L (ref 20–29)
Calcium: 9.3 mg/dL (ref 8.6–10.2)
Chloride: 103 mmol/L (ref 96–106)
Creatinine, Ser: 0.76 mg/dL (ref 0.76–1.27)
Globulin, Total: 2.2 g/dL (ref 1.5–4.5)
Glucose: 74 mg/dL (ref 70–99)
Potassium: 4.5 mmol/L (ref 3.5–5.2)
Sodium: 139 mmol/L (ref 134–144)
Total Protein: 6.4 g/dL (ref 6.0–8.5)
eGFR: 90 mL/min/{1.73_m2} (ref >59)

## 2022-12-23 LAB — CBC WITH DIFFERENTIAL/PLATELET
Basophils Absolute: 0.1 10*3/uL (ref 0.0–0.2)
Basos: 1 %
EOS (ABSOLUTE): 0.1 10*3/uL (ref 0.0–0.4)
Eos: 1 %
Hematocrit: 43.1 % (ref 37.5–51.0)
Hemoglobin: 13.7 g/dL (ref 13.0–17.7)
Immature Grans (Abs): 0 10*3/uL (ref 0.0–0.1)
Immature Granulocytes: 0 %
Lymphocytes Absolute: 1.6 10*3/uL (ref 0.7–3.1)
Lymphs: 22 %
MCH: 27.5 pg (ref 26.6–33.0)
MCHC: 31.8 g/dL (ref 31.5–35.7)
MCV: 87 fL (ref 79–97)
Monocytes Absolute: 0.6 10*3/uL (ref 0.1–0.9)
Monocytes: 8 %
Neutrophils Absolute: 5 10*3/uL (ref 1.4–7.0)
Neutrophils: 68 %
Platelets: 245 10*3/uL (ref 150–450)
RBC: 4.98 x10E6/uL (ref 4.14–5.80)
RDW: 13.6 % (ref 11.6–15.4)
WBC: 7.4 10*3/uL (ref 3.4–10.8)

## 2022-12-23 LAB — LIPID PANEL
Chol/HDL Ratio: 1.8 ratio (ref 0.0–5.0)
Cholesterol, Total: 68 mg/dL — ABNORMAL LOW (ref 100–199)
HDL: 38 mg/dL — ABNORMAL LOW (ref >39)
LDL Chol Calc (NIH): 19 mg/dL (ref 0–99)
Triglycerides: 31 mg/dL (ref 0–149)
VLDL Cholesterol Cal: 11 mg/dL (ref 5–40)

## 2022-12-23 LAB — TSH+FREE T4
Free T4: 1.85 ng/dL — ABNORMAL HIGH (ref 0.82–1.77)
TSH: 1.93 u[IU]/mL (ref 0.450–4.500)

## 2022-12-27 NOTE — Progress Notes (Signed)
Hello Wallis,  Your lab result is normal and/or stable.Some minor variations that are not significant are commonly marked abnormal, but do not represent any medical problem for you.  Best regards, Warren Stacks, M.D.

## 2023-01-06 ENCOUNTER — Telehealth: Payer: Self-pay

## 2023-01-06 NOTE — Telephone Encounter (Signed)
Patient informed we have received four boxes of Ozempic from patient assistance and they have been placed in the refrigerator for him to pick up.

## 2023-01-13 ENCOUNTER — Telehealth: Payer: Self-pay | Admitting: *Deleted

## 2023-01-13 NOTE — Telephone Encounter (Signed)
Patient assistance shipment of [name] [name] has arrived for patient. Patient made aware available for pick up.

## 2023-02-08 ENCOUNTER — Ambulatory Visit (INDEPENDENT_AMBULATORY_CARE_PROVIDER_SITE_OTHER): Payer: Medicare HMO

## 2023-02-08 DIAGNOSIS — Z23 Encounter for immunization: Secondary | ICD-10-CM

## 2023-02-11 ENCOUNTER — Other Ambulatory Visit: Payer: Self-pay | Admitting: Family Medicine

## 2023-03-11 ENCOUNTER — Ambulatory Visit: Payer: Medicare HMO

## 2023-03-24 ENCOUNTER — Ambulatory Visit (INDEPENDENT_AMBULATORY_CARE_PROVIDER_SITE_OTHER): Payer: Medicare HMO | Admitting: Family Medicine

## 2023-03-24 ENCOUNTER — Encounter: Payer: Self-pay | Admitting: Family Medicine

## 2023-03-24 VITALS — BP 113/67 | HR 62 | Temp 97.2°F | Ht 70.0 in | Wt 164.1 lb

## 2023-03-24 DIAGNOSIS — E034 Atrophy of thyroid (acquired): Secondary | ICD-10-CM | POA: Diagnosis not present

## 2023-03-24 DIAGNOSIS — Z794 Long term (current) use of insulin: Secondary | ICD-10-CM | POA: Diagnosis not present

## 2023-03-24 DIAGNOSIS — I10 Essential (primary) hypertension: Secondary | ICD-10-CM | POA: Diagnosis not present

## 2023-03-24 DIAGNOSIS — E119 Type 2 diabetes mellitus without complications: Secondary | ICD-10-CM | POA: Diagnosis not present

## 2023-03-24 DIAGNOSIS — E782 Mixed hyperlipidemia: Secondary | ICD-10-CM | POA: Diagnosis not present

## 2023-03-24 LAB — BAYER DCA HB A1C WAIVED: HB A1C (BAYER DCA - WAIVED): 6.8 % — ABNORMAL HIGH (ref 4.8–5.6)

## 2023-03-24 MED ORDER — ATORVASTATIN CALCIUM 80 MG PO TABS: ORAL_TABLET | ORAL | 3 refills | Status: DC

## 2023-03-24 MED ORDER — METOPROLOL TARTRATE 50 MG PO TABS: ORAL_TABLET | ORAL | 3 refills | Status: DC

## 2023-03-24 MED ORDER — IRBESARTAN 150 MG PO TABS
ORAL_TABLET | ORAL | 3 refills | Status: DC
Start: 2023-03-24 — End: 2024-02-08

## 2023-03-24 MED ORDER — AMLODIPINE BESYLATE 5 MG PO TABS: ORAL_TABLET | ORAL | 3 refills | Status: DC

## 2023-03-24 MED ORDER — METFORMIN HCL 1000 MG PO TABS: ORAL_TABLET | ORAL | 3 refills | Status: DC

## 2023-03-24 MED ORDER — LEVOTHYROXINE SODIUM 75 MCG PO TABS: ORAL_TABLET | ORAL | 3 refills | Status: DC

## 2023-03-24 NOTE — Progress Notes (Signed)
Subjective:  Patient ID: [name] [name],  male    DOB: 1940-07-28  Age: 82 y.o.    CC: Medical Management of Chronic Issues and Diabetes   HPI FRANKIN WILKINS presents for  follow-up of hypertension. Patient has no history of headache chest pain or shortness of breath or recent cough. Patient also denies symptoms of TIA such as numbness weakness lateralizing. Patient denies side effects from medication. States taking it regularly.  Patient also  in for follow-up of elevated cholesterol. Doing well without complaints on current medication. Denies side effects  including myalgia and arthralgia and nausea. Also in today for liver function testing. Currently no chest pain, shortness of breath or other cardiovascular related symptoms noted.  Follow-up of diabetes. Patient does check blood sugar at home. Readings run"good" Patient denies symptoms such as excessive hunger or urinary frequency, excessive hunger, nausea No significant hypoglycemic spells noted. Medications reviewed. Pt reports taking them regularly. Pt. denies complication/adverse reaction today.    follow-up on  thyroid. The patient has a history of hypothyroidism for many years. It has been stable recently. Pt. denies any change in  voice, loss of hair, heat or cold intolerance. Energy level has been adequate to good. Patient denies constipation and diarrhea. No myxedema. Medication is as noted below. Verified that pt is taking it daily on an empty stomach. Well tolerated.    History Wyn has a past medical history of Basal cell carcinoma, CAD (coronary artery disease), Cardiac arrest (HCC), Cataract, Diabetes mellitus without complication (HCC), Diverticulitis, Erectile dysfunction, Gastroesophageal reflux disease with hiatal hernia, Goiter, nontoxic, multinodular, Hiatal hernia, Hyperlipidemia, Inguinal hernia, Kidney stone (1980), Leg fracture, Liver hemangioma, Nephrolithiasis, and Vitamin D deficiency.   He has a past  surgical history that includes LEFT HEART CATH AND CORONARY ANGIOGRAPHY (N/A, 08/25/2018); CORONARY STENT INTERVENTION (N/A, 08/25/2018); Laceration repair (2008); Back surgery (1984); Tibia fracture surgery; Cataract extraction (Bilateral); Eye surgery; Adjacent tissue transfer/tissue rearrangement (Left, 06/20/2020); Full thickness skin graft (Left, 06/20/2020); and skin cancer removal.   His family history includes Cancer in his father; Diabetes in an other family member; Early death (age of onset: 3) in his brother; Heart attack in his sister; Heart disease in his brother; Heart disease (age of onset: 31) in his mother.He reports that he quit smoking about 39 years ago. His smoking use included cigarettes. He started smoking about 66 years ago. He has a 27 pack-year smoking history. He has never used smokeless tobacco. He reports that he does not drink alcohol and does not use drugs.  Current Outpatient Medications on File Prior to Visit  Medication Sig Dispense Refill   Accu-Chek FastClix Lancets MISC Check BS TID Dx E11.9 300 each 3   acetaminophen (TYLENOL) 325 MG tablet Take 2 tablets (650 mg total) by mouth every 4 (four) hours as needed for mild pain or headache.     aspirin EC 81 MG tablet Take 1 tablet (81 mg total) by mouth daily. 90 tablet 3   Blood Glucose Monitoring Suppl (ACCU-CHEK AVIVA PLUS) w/Device KIT Check BS TID Dx E11.9 1 kit 0   Cholecalciferol 125 MCG (5000 UT) capsule Take 5,000 Units by mouth. Take one tablet three times weekly      CINNAMON PO Take by mouth 2 (two) times daily.      fluticasone (FLONASE) 50 MCG/ACT nasal spray USE 2 SPRAYS IN EACH NOSTRIL DAILY AS NEEDED FOR ALLERGIES 48 g 1   Garlic 100 MG TABS Take by mouth 2 (two)  times daily.      glucose blood (ACCU-CHEK AVIVA PLUS) test strip Check BS TID Dx E11.9 300 each 3   insulin degludec (TRESIBA FLEXTOUCH) 100 UNIT/ML FlexTouch Pen Inject 60 Units into the skin daily. 15 mL 3   Semaglutide, 1 MG/DOSE, (OZEMPIC, 1  MG/DOSE,) 4 MG/3ML SOPN Inject 1 mg into the skin once a week. 9 mL 1   No current facility-administered medications on file prior to visit.    ROS Review of Systems  Constitutional:  Negative for fever.  Respiratory:  Negative for shortness of breath.   Cardiovascular:  Negative for chest pain.  Musculoskeletal:  Negative for arthralgias.  Skin:  Negative for rash.    Objective:  BP 113/67   Pulse 62   Temp (!) 97.2 F (36.2 C) (Temporal)   Ht 5\' 10"  (1.778 m)   Wt 164 lb 2 oz (74.4 kg)   SpO2 99%   BMI 23.55 kg/m   BP Readings from Last 3 Encounters:  03/24/23 113/67  12/22/22 136/68  09/15/22 117/64    Wt Readings from Last 3 Encounters:  03/24/23 164 lb 2 oz (74.4 kg)  12/22/22 161 lb 12.8 oz (73.4 kg)  09/15/22 165 lb 6.4 oz (75 kg)     Physical Exam Vitals reviewed.  Constitutional:      Appearance: He is well-developed.  HENT:     Head: Normocephalic and atraumatic.     Right Ear: External ear normal.     Left Ear: External ear normal.     Mouth/Throat:     Pharynx: No oropharyngeal exudate or posterior oropharyngeal erythema.  Eyes:     Pupils: Pupils are equal, round, and reactive to light.  Cardiovascular:     Rate and Rhythm: Normal rate and regular rhythm.     Heart sounds: No murmur heard. Pulmonary:     Effort: No respiratory distress.     Breath sounds: Normal breath sounds.  Musculoskeletal:     Cervical back: Normal range of motion and neck supple.  Neurological:     Mental Status: He is alert and oriented to person, place, and time.     Diabetic Foot Exam - Simple   Simple Foot Form Diabetic Foot exam was performed with the following findings: Yes 03/24/2023  8:45 AM  Visual Inspection No deformities, no ulcerations, no other skin breakdown bilaterally: Yes Sensation Testing Intact to touch and monofilament testing bilaterally: Yes Pulse Check Posterior Tibialis and Dorsalis pulse intact bilaterally: Yes Comments Numbness tip  of right middle toe. No other deficits. Onychomycosis of toe nails., mild. Managed by podiatry     Lab Results  Component Value Date   HGBA1C 5.8 (H) 12/22/2022   HGBA1C 7.3 (H) 09/15/2022   HGBA1C 7.9 (H) 06/17/2022    Assessment & Plan:   Davontay was seen today for medical management of chronic issues and diabetes.  Diagnoses and all orders for this visit:  Type 2 diabetes mellitus treated with insulin (HCC) -     Microalbumin / creatinine urine ratio -     Bayer DCA Hb A1c Waived -     Vitamin B12 -     irbesartan (AVAPRO) 150 MG tablet; TAKE 1 TABLET EVERY DAY FOR BLOOD PRESSURE -     metFORMIN (GLUCOPHAGE) 1000 MG tablet; TAKE 1 TABLET TWICE DAILY WITH MEALS FOR DIABETES  Mixed hyperlipidemia -     Lipid panel  Essential hypertension -     CBC with Differential/Platelet -  CMP14+EGFR -     metoprolol tartrate (LOPRESSOR) 50 MG tablet; TAKE 1/2 TABLET EVERY DAY FOR BLOOD PRESSURE AND HEART -     irbesartan (AVAPRO) 150 MG tablet; TAKE 1 TABLET EVERY DAY FOR BLOOD PRESSURE  Hypothyroidism due to acquired atrophy of thyroid -     TSH + free T4  Other orders -     levothyroxine (SYNTHROID) 75 MCG tablet; TAKE 1 TABLET EVERY DAY FOR THYROID -     atorvastatin (LIPITOR) 80 MG tablet; TAKE 1 TABLET EVERY DAY FOR CHOLESTEROL -     amLODipine (NORVASC) 5 MG tablet; TAKE 1 TABLET EVERY DAY FOR BLOOD PRESSURE   I am having Harlow F. Roseanne Reno maintain his acetaminophen, Cholecalciferol, Garlic, Accu-Chek Aviva Plus, Accu-Chek Aviva Plus, Accu-Chek FastClix Lancets, CINNAMON PO, aspirin EC, Ozempic (1 MG/DOSE), Tresiba FlexTouch, fluticasone, metoprolol tartrate, irbesartan, metFORMIN, levothyroxine, atorvastatin, and amLODipine.  Meds ordered this encounter  Medications   metoprolol tartrate (LOPRESSOR) 50 MG tablet    Sig: TAKE 1/2 TABLET EVERY DAY FOR BLOOD PRESSURE AND HEART    Dispense:  45 tablet    Refill:  3   irbesartan (AVAPRO) 150 MG tablet    Sig: TAKE 1 TABLET  EVERY DAY FOR BLOOD PRESSURE    Dispense:  90 tablet    Refill:  3   metFORMIN (GLUCOPHAGE) 1000 MG tablet    Sig: TAKE 1 TABLET TWICE DAILY WITH MEALS FOR DIABETES    Dispense:  180 tablet    Refill:  3   levothyroxine (SYNTHROID) 75 MCG tablet    Sig: TAKE 1 TABLET EVERY DAY FOR THYROID    Dispense:  90 tablet    Refill:  3   atorvastatin (LIPITOR) 80 MG tablet    Sig: TAKE 1 TABLET EVERY DAY FOR CHOLESTEROL    Dispense:  90 tablet    Refill:  3   amLODipine (NORVASC) 5 MG tablet    Sig: TAKE 1 TABLET EVERY DAY FOR BLOOD PRESSURE    Dispense:  90 tablet    Refill:  3     Follow-up: Return in about 3 months (around 06/22/2023) for diabetes.  Mechele Claude, M.D.

## 2023-03-25 LAB — CBC WITH DIFFERENTIAL/PLATELET
Basophils Absolute: 0.1 10*3/uL (ref 0.0–0.2)
Basos: 1 %
EOS (ABSOLUTE): 0.2 10*3/uL (ref 0.0–0.4)
Eos: 3 %
Hematocrit: 44.8 % (ref 37.5–51.0)
Hemoglobin: 14.3 g/dL (ref 13.0–17.7)
Immature Grans (Abs): 0 10*3/uL (ref 0.0–0.1)
Immature Granulocytes: 1 %
Lymphocytes Absolute: 1.9 10*3/uL (ref 0.7–3.1)
Lymphs: 23 %
MCH: 27.7 pg (ref 26.6–33.0)
MCHC: 31.9 g/dL (ref 31.5–35.7)
MCV: 87 fL (ref 79–97)
Monocytes Absolute: 0.7 10*3/uL (ref 0.1–0.9)
Monocytes: 9 %
Neutrophils Absolute: 5.2 10*3/uL (ref 1.4–7.0)
Neutrophils: 63 %
Platelets: 237 10*3/uL (ref 150–450)
RBC: 5.16 x10E6/uL (ref 4.14–5.80)
RDW: 12.9 % (ref 11.6–15.4)
WBC: 8 10*3/uL (ref 3.4–10.8)

## 2023-03-25 LAB — CMP14+EGFR
ALT: 30 [IU]/L (ref 0–44)
AST: 34 [IU]/L (ref 0–40)
Albumin: 4.3 g/dL (ref 3.7–4.7)
Alkaline Phosphatase: 102 [IU]/L (ref 44–121)
BUN/Creatinine Ratio: 12 (ref 10–24)
BUN: 10 mg/dL (ref 8–27)
Bilirubin Total: 0.4 mg/dL (ref 0.0–1.2)
CO2: 24 mmol/L (ref 20–29)
Calcium: 9.6 mg/dL (ref 8.6–10.2)
Chloride: 103 mmol/L (ref 96–106)
Creatinine, Ser: 0.85 mg/dL (ref 0.76–1.27)
Globulin, Total: 2.4 g/dL (ref 1.5–4.5)
Glucose: 86 mg/dL (ref 70–99)
Potassium: 4.5 mmol/L (ref 3.5–5.2)
Sodium: 140 mmol/L (ref 134–144)
Total Protein: 6.7 g/dL (ref 6.0–8.5)
eGFR: 87 mL/min/{1.73_m2} (ref >59)

## 2023-03-25 LAB — MICROALBUMIN / CREATININE URINE RATIO
Creatinine, Urine: 78.8 mg/dL
Microalb/Creat Ratio: 7 mg/g{creat} (ref 0–29)
Microalbumin, Urine: 5.5 ug/mL

## 2023-03-25 LAB — LIPID PANEL
Chol/HDL Ratio: 2 {ratio} (ref 0.0–5.0)
Cholesterol, Total: 72 mg/dL — ABNORMAL LOW (ref 100–199)
HDL: 36 mg/dL — ABNORMAL LOW (ref >39)
LDL Chol Calc (NIH): 23 mg/dL (ref 0–99)
Triglycerides: 48 mg/dL (ref 0–149)
VLDL Cholesterol Cal: 13 mg/dL (ref 5–40)

## 2023-03-25 LAB — VITAMIN B12: Vitamin B-12: 175 pg/mL — ABNORMAL LOW (ref 232–1245)

## 2023-03-25 LAB — TSH+FREE T4
Free T4: 1.48 ng/dL (ref 0.82–1.77)
TSH: 3.07 u[IU]/mL (ref 0.450–4.500)

## 2023-03-27 NOTE — Progress Notes (Signed)
Vitamin B12 is low. Needs to get injections, one ml weekly for a month then one ml monthly.

## 2023-03-28 ENCOUNTER — Telehealth: Payer: Self-pay | Admitting: Family Medicine

## 2023-03-28 ENCOUNTER — Other Ambulatory Visit: Payer: Self-pay | Admitting: *Deleted

## 2023-03-28 ENCOUNTER — Telehealth: Payer: Self-pay

## 2023-03-28 DIAGNOSIS — E538 Deficiency of other specified B group vitamins: Secondary | ICD-10-CM

## 2023-03-28 MED ORDER — CYANOCOBALAMIN 1000 MCG/ML IJ SOLN
1000.0000 ug | INTRAMUSCULAR | Status: AC
  Administered 2023-03-29 – 2024-02-13 (×7): 1000 ug via INTRAMUSCULAR

## 2023-03-28 MED ORDER — CYANOCOBALAMIN 1000 MCG/ML IJ SOLN
1000.0000 ug | INTRAMUSCULAR | Status: AC
  Administered 2023-04-05 – 2024-05-24 (×5): 1000 ug via INTRAMUSCULAR

## 2023-03-28 NOTE — Telephone Encounter (Signed)
Patient aware of lab results, B12 injection scheduled

## 2023-03-28 NOTE — Telephone Encounter (Signed)
Mail out pt portion on PAP [place] and [employer].

## 2023-03-28 NOTE — Telephone Encounter (Signed)
Copied from CRM [id number]. Topic: Clinical - Lab/Test Results >> Mar 28, 2023  3:32 PM Larwance Sachs wrote: Reason for CRM: Patient returned call for [facility] booth LPN regarding labs, was not able to reach [place] through [facility]. Patient requested call back at [phone]

## 2023-03-29 ENCOUNTER — Ambulatory Visit (INDEPENDENT_AMBULATORY_CARE_PROVIDER_SITE_OTHER): Payer: Medicare HMO

## 2023-03-29 DIAGNOSIS — E538 Deficiency of other specified B group vitamins: Secondary | ICD-10-CM

## 2023-03-29 NOTE — Progress Notes (Signed)
Cyanocobalamin injection given to right deltoid.  Patient tolerated well. 

## 2023-04-05 ENCOUNTER — Ambulatory Visit (INDEPENDENT_AMBULATORY_CARE_PROVIDER_SITE_OTHER): Payer: Medicare HMO

## 2023-04-05 DIAGNOSIS — E538 Deficiency of other specified B group vitamins: Secondary | ICD-10-CM | POA: Diagnosis not present

## 2023-04-05 NOTE — Progress Notes (Signed)
Patient is in office today for a nurse visit for B12 Injection. Patient Injection was given in the  Left deltoid. Patient tolerated injection well.

## 2023-04-08 ENCOUNTER — Telehealth: Payer: Self-pay | Admitting: Pharmacist

## 2023-04-08 NOTE — Progress Notes (Unsigned)
Pharmacy Medication Assistance Program Note  [facility]  Patient ID: [name] [name], male   DOB: 06/29/1940, 82 y.o.   MRN: [id number]     03/28/2023  Outreach Medication One  Initial Outreach Date (Medication One) 03/28/2023  Manufacturer Medication One [employer] [facility] Ozempic  Type of Assistance Manufacturer Assistance  Date Application Sent to Patient 03/28/2023  Application Items Requested Application  Date Application Sent to Prescriber 04/08/2023  Name of Prescriber Mechele Claude  Date Application Received From Patient 04/06/2023  Application Items Received From Patient Application        04/08/2023  Outreach Medication Two  Manufacturer Medication Two Novo Nordisk  Nordisk Drugs [name]  Dose of [name] U200  Type of Radiographer, therapeutic Assistance  Date Application Sent to Patient 03/28/2023  Application Items Requested Application  Date Application Sent to Prescriber 04/08/2023  Name of Prescriber Mechele Claude  Date Application Received From Patient 04/06/2023  Application Items Received From Patient Application

## 2023-04-08 NOTE — Telephone Encounter (Signed)
Patient called to check on enrollment status I did not see chart notes regarding his re-enrollment Send me PAPs if needed, however patient stated he turned in I may have missed your correspondence Thank you!

## 2023-04-11 ENCOUNTER — Ambulatory Visit (INDEPENDENT_AMBULATORY_CARE_PROVIDER_SITE_OTHER): Payer: Medicare HMO

## 2023-04-11 DIAGNOSIS — E538 Deficiency of other specified B group vitamins: Secondary | ICD-10-CM

## 2023-04-11 NOTE — Progress Notes (Signed)
B12 injection given in Rt deltoid - patient tolerated well

## 2023-04-18 ENCOUNTER — Ambulatory Visit (INDEPENDENT_AMBULATORY_CARE_PROVIDER_SITE_OTHER): Payer: Medicare HMO

## 2023-04-18 DIAGNOSIS — E538 Deficiency of other specified B group vitamins: Secondary | ICD-10-CM

## 2023-04-18 NOTE — Progress Notes (Signed)
Patient is in office today for a nurse visit for B12 Injection. Patient Injection was given in the  Left deltoid. Patient tolerated injection well.

## 2023-05-19 ENCOUNTER — Ambulatory Visit (INDEPENDENT_AMBULATORY_CARE_PROVIDER_SITE_OTHER): Payer: Medicare HMO | Admitting: Family Medicine

## 2023-05-19 DIAGNOSIS — E538 Deficiency of other specified B group vitamins: Secondary | ICD-10-CM

## 2023-05-20 ENCOUNTER — Ambulatory Visit (INDEPENDENT_AMBULATORY_CARE_PROVIDER_SITE_OTHER): Payer: Medicare HMO

## 2023-05-20 VITALS — Ht 70.0 in | Wt 164.0 lb

## 2023-05-20 DIAGNOSIS — Z0000 Encounter for general adult medical examination without abnormal findings: Secondary | ICD-10-CM | POA: Diagnosis not present

## 2023-05-20 NOTE — Progress Notes (Signed)
Subjective:   [name] [name] is a 83 y.o. male who presents for Medicare Annual/Subsequent preventive examination.  Visit Complete: Virtual I connected with  [name] [name] on 05/20/23 by a audio enabled telemedicine application and verified that I am speaking with the correct person using two identifiers.  Patient Location: Home  Provider Location: Home Office  This patient declined Interactive audio and video telecommunications. Therefore the visit was completed with audio only.  I discussed the limitations of evaluation and management by telemedicine. The patient expressed understanding and agreed to proceed.  Vital Signs: Because this visit was a virtual/telehealth visit, some criteria may be missing or patient reported. Any vitals not documented were not able to be obtained and vitals that have been documented are patient reported.  Cardiac Risk Factors include: advanced age (>41men, >65 women);diabetes mellitus;male gender;hypertension;dyslipidemia     Objective:    Today's Vitals   05/20/23 0821  Weight: 164 lb (74.4 kg)  Height: 5\' 10"  (1.778 m)   Body mass index is 23.53 kg/m.     05/20/2023    8:26 AM 08/27/2022    9:57 AM 10/15/2021   11:27 AM 10/14/2020    1:16 PM 06/20/2020    8:28 AM 10/04/2019    9:57 AM 09/20/2018    3:48 PM  Advanced Directives  Does Patient Have a Medical Advance Directive? No No Yes No No No Yes  Type of Surveyor, minerals;Living will    Healthcare Power of [place];Living will  Does patient want to make changes to medical advance directive?       No - Patient declined  Copy of Healthcare Power of Attorney in Chart?   No - copy requested    No - copy requested  Would patient like information on creating a medical advance directive? Yes (MAU/Ambulatory/Procedural Areas - Information given)   No - Patient declined No - Patient declined No - Patient declined No - Patient declined    Current Medications  (verified) Outpatient Encounter Medications as of 05/20/2023  Medication Sig   Accu-Chek FastClix Lancets MISC Check BS TID Dx E11.9   acetaminophen (TYLENOL) 325 MG tablet Take 2 tablets (650 mg total) by mouth every 4 (four) hours as needed for mild pain or headache.   amLODipine (NORVASC) 5 MG tablet TAKE 1 TABLET EVERY DAY FOR BLOOD PRESSURE   aspirin EC 81 MG tablet Take 1 tablet (81 mg total) by mouth daily.   atorvastatin (LIPITOR) 80 MG tablet TAKE 1 TABLET EVERY DAY FOR CHOLESTEROL   Blood Glucose Monitoring Suppl (ACCU-CHEK AVIVA PLUS) w/Device KIT Check BS TID Dx E11.9   Cholecalciferol 125 MCG (5000 UT) capsule Take 5,000 Units by mouth. Take one tablet three times weekly    CINNAMON PO Take by mouth 2 (two) times daily.    fluticasone (FLONASE) 50 MCG/ACT nasal spray USE 2 SPRAYS IN EACH NOSTRIL DAILY AS NEEDED FOR ALLERGIES   Garlic 100 MG TABS Take by mouth 2 (two) times daily.    glucose blood (ACCU-CHEK AVIVA PLUS) test strip Check BS TID Dx E11.9   insulin degludec (TRESIBA FLEXTOUCH) 100 UNIT/ML FlexTouch Pen Inject 60 Units into the skin daily.   irbesartan (AVAPRO) 150 MG tablet TAKE 1 TABLET EVERY DAY FOR BLOOD PRESSURE   levothyroxine (SYNTHROID) 75 MCG tablet TAKE 1 TABLET EVERY DAY FOR THYROID   metFORMIN (GLUCOPHAGE) 1000 MG tablet TAKE 1 TABLET TWICE DAILY WITH MEALS FOR DIABETES   metoprolol tartrate (LOPRESSOR)  50 MG tablet TAKE 1/2 TABLET EVERY DAY FOR BLOOD PRESSURE AND HEART   Semaglutide, 1 MG/DOSE, (OZEMPIC, 1 MG/DOSE,) 4 MG/3ML SOPN Inject 1 mg into the skin once a week.   Facility-Administered Encounter Medications as of 05/20/2023  Medication   cyanocobalamin (VITAMIN B12) injection 1,000 mcg   cyanocobalamin (VITAMIN B12) injection 1,000 mcg    Allergies (verified) Actos [pioglitazone hydrochloride], Lisinopril, Cymbalta [duloxetine hcl], Invokana [canagliflozin], Niaspan [niacin er (antihyperlipidemic)], and Ramipril   History: Past Medical History:   Diagnosis Date   Basal cell carcinoma    left forehead, nose, neck   CAD (coronary artery disease)    a. s/p DES to Prox-LAD and DES to LCx in 08/2018   Cardiac arrest (HCC)    Cataract    Diabetes mellitus without complication (HCC)    Diverticulitis    Erectile dysfunction    Gastroesophageal reflux disease with hiatal hernia    Goiter, nontoxic, multinodular    with macrocyst   Hiatal hernia    With reflex   Hyperlipidemia    Inguinal hernia    right side   Kidney stone 1980   Leg fracture    Left   Liver hemangioma    Nephrolithiasis    Vitamin D deficiency    Past Surgical History:  Procedure Laterality Date   ADJACENT TISSUE TRANSFER/TISSUE REARRANGEMENT Left 06/20/2020   Procedure: Left nasal reconstruction with adjacent tissue transfer;  Surgeon: Allena Napoleon, MD;  Location: [facility] OR;  Service: Plastics;  Laterality: Left;  90 min, please   BACK SURGERY  1984   CATARACT EXTRACTION Bilateral    CORONARY STENT INTERVENTION N/A 08/25/2018   Procedure: CORONARY STENT INTERVENTION;  Surgeon: Tonny Bollman, MD;  Location: [facility] INVASIVE CV LAB;  Service: Cardiovascular;  Laterality: N/A;   EYE SURGERY     LACERATION REPAIR  2008   Dr. Izora Ribas -to hand   LEFT HEART CATH AND CORONARY ANGIOGRAPHY N/A 08/25/2018   Procedure: LEFT HEART CATH AND CORONARY ANGIOGRAPHY;  Surgeon: Tonny Bollman, MD;  Location: [facility] INVASIVE CV LAB;  Service: Cardiovascular;  Laterality: N/A;   skin cancer removal     nose   SKIN FULL THICKNESS GRAFT Left 06/20/2020   Procedure: SKIN GRAFT FULL THICKNESS;  Surgeon: Allena Napoleon, MD;  Location: [facility] OR;  Service: Plastics;  Laterality: Left;   TIBIA FRACTURE SURGERY     left leg   Family History  Problem Relation Age of Onset   Heart attack Sister        Sudden death in her 44s.  No clear as to the cause   Heart disease Brother        Valvular   Heart disease Mother 4       stent, lived to be 39   Cancer Father        ?lung   Early death  Brother 3       spinal menigitis   Diabetes Other    Social History   Socioeconomic History   Marital status: Married    Spouse name: [name] [name]   Number of children: 1   Years of education: Not on file   Highest education level: 11th grade  Occupational History   Occupation: Retired  Tobacco Use   Smoking status: Former    Current packs/day: 0.00    Average packs/day: 1 pack/day for 27.0 years (27.0 ttl pk-yrs)    Types: Cigarettes    Start date: 04/19/1956    Quit date: 04/20/1983  Years since quitting: 40.1   Smokeless tobacco: Never   Tobacco comments:    Quit 25 years ago  Vaping Use   Vaping status: Never Used  Substance and Sexual Activity   Alcohol use: No   Drug use: No   Sexual activity: Yes  Other Topics Concern   Not on file  Social History Narrative   ** Merged History Encounter **       Married.  Lives with wife.  Grandson stays with them some.     Social Drivers of Corporate investment banker Strain: Low Risk  (05/20/2023)   Overall Financial Resource Strain (CARDIA)    Difficulty of Paying Living Expenses: Not hard at all  Food Insecurity: No Food Insecurity (05/20/2023)   Hunger Vital Sign    Worried About Running Out of Food in the Last Year: Never true    Ran Out of Food in the Last Year: Never true  Transportation Needs: No Transportation Needs (05/20/2023)   PRAPARE - Administrator, Civil Service (Medical): No    Lack of Transportation (Non-Medical): No  Physical Activity: Insufficiently Active (05/20/2023)   Exercise Vital Sign    Days of Exercise per Week: 3 days    Minutes of Exercise per Session: 30 min  Stress: No Stress Concern Present (05/20/2023)   [employer] of Occupational Health - Occupational Stress Questionnaire    Feeling of Stress : Not at all  Social Connections: Socially Integrated (05/20/2023)   Social Connection and Isolation Panel [NHANES]    Frequency of Communication with Friends and Family: More than three  times a week    Frequency of Social Gatherings with Friends and Family: Three times a week    Attends Religious Services: More than 4 times per year    Active Member of Clubs or Organizations: Yes    Attends Engineer, structural: More than 4 times per year    Marital Status: Married    Tobacco Counseling Counseling given: Not Answered Tobacco comments: Quit 25 years ago   Clinical Intake:  Pre-visit preparation completed: Yes  Pain : No/denies pain     Diabetes: Yes CBG done?: No Did pt. bring in CBG monitor from home?: No  How often do you need to have someone help you when you read instructions, pamphlets, or other written materials from your doctor or pharmacy?: 1 - Never  Interpreter Needed?: No  Information entered by :: Kandis Fantasia LPN   Activities of Daily Living    05/20/2023    8:22 AM  In your present state of health, do you have any difficulty performing the following activities:  Hearing? 0  Vision? 0  Difficulty concentrating or making decisions? 0  Walking or climbing stairs? 0  Dressing or bathing? 0  Doing errands, shopping? 0  Preparing Food and eating ? N  Using the Toilet? N  In the past six months, have you accidently leaked urine? N  Do you have problems with loss of bowel control? N  Managing your Medications? N  Managing your Finances? N  Housekeeping or managing your Housekeeping? N    Patient Care Team: Mechele Claude, MD as PCP - General (Family Medicine) Rollene Rotunda, MD as PCP - Cardiology (Cardiology) Nelson Chimes, MD as Consulting Physician (Ophthalmology) Rollene Rotunda, MD as Consulting Physician (Cardiology) Danella Maiers, [facility] as Pharmacist (Family Medicine)  Indicate any recent Medical Services you may have received from other than [facility] providers in the past year (  date may be approximate).     Assessment:   This is a routine wellness examination for [place].  Hearing/Vision screen Hearing Screening -  Comments:: Denies hearing difficulties   Vision Screening - Comments:: Wears rx glasses - up to date with routine eye exams with [facility]     Goals Addressed             This Visit's Progress    Remain active and independent        Depression Screen    05/20/2023    8:26 AM 03/24/2023    8:12 AM 12/22/2022    8:13 AM 09/15/2022    8:15 AM 06/17/2022    8:52 AM 03/15/2022    9:03 AM 12/08/2021    8:10 AM  PHQ 2/9 Scores  PHQ - 2 Score 0 0 0 0 0 0 0  PHQ- 9 Score 0 0         Fall Risk    05/20/2023    8:31 AM 03/24/2023    8:12 AM 12/22/2022    8:13 AM 09/15/2022    8:15 AM 06/17/2022    8:52 AM  Fall Risk   Falls in the past year? 0 0 0 0 0  Number falls in past yr: 0      Injury with Fall? 0      Risk for fall due to : No Fall Risks      Follow up Falls prevention discussed;Education provided;Falls evaluation completed        MEDICARE RISK AT HOME: Medicare Risk at Home Any stairs in or around the home?: No If so, are there any without handrails?: No Home free of loose throw rugs in walkways, pet beds, electrical cords, etc?: Yes Adequate lighting in your home to reduce risk of falls?: Yes Life alert?: No Use of a cane, walker or w/c?: No Grab bars in the bathroom?: Yes Shower chair or bench in shower?: Yes Elevated toilet seat or a handicapped toilet?: Yes  TIMED UP AND GO:  Was the test performed?  No    Cognitive Function:    12/01/2015    9:06 AM 11/04/2014   10:17 AM  MMSE - Mini Mental State Exam  Orientation to time 5 5  Orientation to Place 5 5  Registration 3 3  Attention/ Calculation 5 5  Recall 2 3  Language- name 2 objects 2 2  Language- repeat 1 1  Language- follow 3 step command 3 3  Language- read & follow direction 1 1  Write a sentence 1 1  Copy design 1 1  Total score 29 30        05/20/2023    8:31 AM 10/15/2021   11:30 AM 10/14/2020    1:13 PM 10/04/2019    9:48 AM 09/20/2018    3:50 PM  6CIT Screen  What Year? 0  points 0 points 0 points 0 points 0 points  What month? 0 points 0 points 0 points 0 points 0 points  What time? 0 points 0 points 0 points 0 points 0 points  Count back from 20 0 points 0 points 0 points 2 points 0 points  Months in reverse 2 points 2 points 0 points 2 points 0 points  Repeat phrase 2 points 4 points 0 points 0 points 0 points  Total Score 4 points 6 points 0 points 4 points 0 points    Immunizations Immunization History  Administered Date(s) Administered   Fluad Quad(high Dose  65+) 02/20/2019, 02/13/2020, 03/05/2021, 03/15/2022   Fluad Trivalent(High Dose 65+) 02/08/2023   Influenza Whole 01/17/2010   Influenza, High Dose Seasonal PF 01/31/2015, 02/18/2016, 02/16/2017, 01/25/2018   Influenza,inj,Quad PF,6+ Mos 02/06/2014   Influenza-Unspecified 02/17/2013   Moderna SARS-COV2 Booster Vaccination 03/26/2020   Moderna Sars-Covid-2 Vaccination 05/29/2019, 06/26/2019   Pneumococcal Conjugate-13 04/02/2013   Pneumococcal Polysaccharide-23 02/18/2008   Td 12/19/2006, 02/08/2011, 04/28/2017   Tdap 02/08/2011   Zoster Recombinant(Shingrix) 06/08/2021, 09/07/2021   Zoster, Live 10/28/2010    TDAP status: Up to date  Flu Vaccine status: Up to date  Pneumococcal vaccine status: Up to date  Covid-19 vaccine status: Information provided on how to obtain vaccines.   Qualifies for Shingles Vaccine? Yes   Zostavax completed Yes   Shingrix Completed?: Yes  Screening Tests Health Maintenance  Topic Date Due   OPHTHALMOLOGY EXAM  04/09/2023   COVID-19 Vaccine (3 - Moderna risk series) 04/08/2024 (Originally 04/23/2020)   HEMOGLOBIN A1C  09/22/2023   Diabetic kidney evaluation - eGFR measurement  03/23/2024   Diabetic kidney evaluation - Urine ACR  03/23/2024   FOOT EXAM  03/23/2024   Medicare Annual Wellness (AWV)  05/19/2024   DTaP/Tdap/Td (5 - Td or Tdap) 04/29/2027   Pneumonia Vaccine 28+ Years old  Completed   INFLUENZA VACCINE  Completed   Zoster Vaccines-  Shingrix  Completed   HPV VACCINES  Aged Out   Hepatitis C Screening  Discontinued    Health Maintenance  Health Maintenance Due  Topic Date Due   OPHTHALMOLOGY EXAM  04/09/2023    Colorectal cancer screening: No longer required.   Lung Cancer Screening: (Low Dose CT Chest recommended if Age 58-80 years, 20 pack-year currently smoking OR have quit w/in 15years.) does not qualify.   Lung Cancer Screening Referral: n/a  Additional Screening:  Hepatitis C Screening: does qualify; Completed 11/28/19  Vision Screening: Recommended annual ophthalmology exams for early detection of glaucoma and other disorders of the eye. Is the patient up to date with their annual eye exam?  Yes  Who is the provider or what is the name of the office in which the patient attends annual eye exams? [facility]  If pt is not established with a provider, would they like to be referred to a provider to establish care? No .   Dental Screening: Recommended annual dental exams for proper oral hygiene  Diabetic Foot Exam: Diabetic Foot Exam: Completed 03/24/23  Community Resource Referral / Chronic Care Management: CRR required this visit?  No   CCM required this visit?  No     Plan:     I have personally reviewed and noted the following in the patient's chart:   Medical and social history Use of alcohol, tobacco or illicit drugs  Current medications and supplements including opioid prescriptions. Patient is not currently taking opioid prescriptions. Functional ability and status Nutritional status Physical activity Advanced directives List of other physicians Hospitalizations, surgeries, and ER visits in previous 12 months Vitals Screenings to include cognitive, depression, and falls Referrals and appointments  In addition, I have reviewed and discussed with patient certain preventive protocols, quality metrics, and best practice recommendations. A written personalized care plan for preventive  services as well as general preventive health recommendations were provided to patient.     Kandis Fantasia [place], [place]   1/61/0960   After Visit Summary: (Mail) Due to this being a telephonic visit, the after visit summary with patients personalized plan was offered to patient via mail  Nurse Notes: No concerns at this time

## 2023-05-20 NOTE — Patient Instructions (Signed)
[name] , Thank you for taking time to come for your Medicare Wellness Visit. I appreciate your ongoing commitment to your health goals. Please review the following plan we discussed and let me know if I can assist you in the future.   Referrals/Orders/Follow-Ups/Clinician Recommendations: Aim for 30 minutes of exercise or brisk walking, 6-8 glasses of water, and 5 servings of fruits and vegetables each day.  This is a list of the screening recommended for you and due dates:  Health Maintenance  Topic Date Due   Eye exam for diabetics  04/09/2023   COVID-19 Vaccine (3 - Moderna risk series) 04/08/2024*   Hemoglobin A1C  09/22/2023   Yearly kidney function blood test for diabetes  03/23/2024   Yearly kidney health urinalysis for diabetes  03/23/2024   Complete foot exam   03/23/2024   Medicare Annual Wellness Visit  05/19/2024   DTaP/Tdap/Td vaccine (5 - Td or Tdap) 04/29/2027   Pneumonia Vaccine  Completed   Flu Shot  Completed   Zoster (Shingles) Vaccine  Completed   HPV Vaccine  Aged Out   Hepatitis C Screening  Discontinued  *Topic was postponed. The date shown is not the original due date.    Advanced directives: (ACP Link)Information on Advanced Care Planning can be found at [facility] [place] Advance Health Care Directives Advance Health Care Directives ([email or web address]   Next Medicare Annual Wellness Visit scheduled for next year: Yes

## 2023-05-25 NOTE — Telephone Encounter (Signed)
 Following up on pt PAP Novo Nordisk I will close this encounter, RPH and Camile are working on his application.

## 2023-06-16 ENCOUNTER — Ambulatory Visit (INDEPENDENT_AMBULATORY_CARE_PROVIDER_SITE_OTHER): Payer: Medicare HMO

## 2023-06-16 DIAGNOSIS — E538 Deficiency of other specified B group vitamins: Secondary | ICD-10-CM | POA: Diagnosis not present

## 2023-06-16 NOTE — Progress Notes (Signed)
 Patient is in office today for a nurse visit for B12 Injection. Patient Injection was given in the  Left deltoid. Patient tolerated injection well.

## 2023-06-22 ENCOUNTER — Ambulatory Visit: Payer: Medicare HMO | Admitting: Family Medicine

## 2023-06-22 ENCOUNTER — Encounter: Payer: Self-pay | Admitting: Family Medicine

## 2023-06-22 VITALS — BP 126/73 | HR 73 | Temp 99.0°F | Ht 70.0 in | Wt 168.0 lb

## 2023-06-22 DIAGNOSIS — E782 Mixed hyperlipidemia: Secondary | ICD-10-CM | POA: Diagnosis not present

## 2023-06-22 DIAGNOSIS — E119 Type 2 diabetes mellitus without complications: Secondary | ICD-10-CM | POA: Diagnosis not present

## 2023-06-22 DIAGNOSIS — I10 Essential (primary) hypertension: Secondary | ICD-10-CM | POA: Diagnosis not present

## 2023-06-22 DIAGNOSIS — Z794 Long term (current) use of insulin: Secondary | ICD-10-CM

## 2023-06-22 DIAGNOSIS — E034 Atrophy of thyroid (acquired): Secondary | ICD-10-CM | POA: Diagnosis not present

## 2023-06-22 DIAGNOSIS — E1169 Type 2 diabetes mellitus with other specified complication: Secondary | ICD-10-CM | POA: Diagnosis not present

## 2023-06-22 LAB — BAYER DCA HB A1C WAIVED: HB A1C (BAYER DCA - WAIVED): 7 % — ABNORMAL HIGH (ref 4.8–5.6)

## 2023-06-22 NOTE — Progress Notes (Signed)
 Subjective:  Patient ID: [name] [name],  male    DOB: 1940-04-28  Age: 83 y.o.    CC: Medical Management of Chronic Issues   HPI [name] [name] presents for  follow-up of hypertension. Patient has no history of he1adache chest pain or shortness of breath or recent cough. Patient also denies symptoms of TIA such as numbness weakness lateralizing. Patient denies side effects from medication. States taking it regularly.  Patient also  in for follow-up of elevated cholesterol. Doing well without complaints on current medication. Denies side effects  including myalgia and arthralgia and nausea. Also in today for liver function testing. Currently no chest pain, shortness of breath or other cardiovascular related symptoms noted.  Follow-up of diabetes. Patient does check blood sugar at home. Readings run between 100 and 110 fasting Patient denies symptoms such as excessive hunger or urinary frequency, excessive hunger, nausea No significant hypoglycemic spells noted. Medications reviewed. Pt reports taking them regularly. Pt. denies complication/adverse reaction today.    follow-up on  thyroid. The patient has a history of hypothyroidism for many years. It has been stable recently. Pt. denies any change in  voice, loss of hair, heat or cold intolerance. Energy level has been adequate to good. Patient denies constipation and diarrhea. No myxedema. Medication is as noted below. Verified that pt is taking it daily on an empty stomach. Well tolerated.    History [name] [name] has a past medical history of Basal cell carcinoma, CAD (coronary artery disease), Cardiac arrest (HCC), Cataract, Diabetes mellitus without complication (HCC), Diverticulitis, Erectile dysfunction, Gastroesophageal reflux disease with hiatal hernia, Goiter, nontoxic, multinodular, Hiatal hernia, Hyperlipidemia, Inguinal hernia, Kidney stone (1980), Leg fracture, Liver hemangioma, Nephrolithiasis, and Vitamin D deficiency.   [name] [name] has a  past surgical history that includes LEFT HEART CATH AND CORONARY ANGIOGRAPHY (N/A, 08/25/2018); CORONARY STENT INTERVENTION (N/A, 08/25/2018); Laceration repair (2008); Back surgery (1984); Tibia fracture surgery; Cataract extraction (Bilateral); Eye surgery; Adjacent tissue transfer/tissue rearrangement (Left, 06/20/2020); Full thickness skin graft (Left, 06/20/2020); and skin cancer removal.   His family history includes Cancer in his father; Diabetes in an other family member; Early death (age of onset: 3) in his brother; Heart attack in his sister; Heart disease in his brother; Heart disease (age of onset: 6) in his mother.[name] [name] reports that [name] [name] quit smoking about 40 years ago. His smoking use included cigarettes. [name] [name] started smoking about 67 years ago. [name] [name] has a 27 pack-year smoking history. [name] [name] has never used smokeless tobacco. [name] [name] reports that [name] [name] does not drink alcohol and does not use drugs.  Current Outpatient Medications on File Prior to Visit  Medication Sig Dispense Refill   Accu-Chek FastClix Lancets MISC Check BS TID Dx E11.9 300 each 3   acetaminophen (TYLENOL) 325 MG tablet Take 2 tablets (650 mg total) by mouth every 4 (four) hours as needed for mild pain or headache.     amLODipine (NORVASC) 5 MG tablet TAKE 1 TABLET EVERY DAY FOR BLOOD PRESSURE 90 tablet 3   aspirin EC 81 MG tablet Take 1 tablet (81 mg total) by mouth daily. 90 tablet 3   atorvastatin (LIPITOR) 80 MG tablet TAKE 1 TABLET EVERY DAY FOR CHOLESTEROL 90 tablet 3   Blood Glucose Monitoring Suppl (ACCU-CHEK AVIVA PLUS) w/Device KIT Check BS TID Dx E11.9 1 kit 0   Cholecalciferol 125 MCG (5000 UT) capsule Take 5,000 Units by mouth. Take one tablet three times weekly      CINNAMON PO Take by mouth 2 (two)  times daily.      fluticasone (FLONASE) 50 MCG/ACT nasal spray USE 2 SPRAYS IN EACH NOSTRIL DAILY AS NEEDED FOR ALLERGIES 48 g 1   Garlic 100 MG TABS Take by mouth 2 (two) times daily.      glucose blood (ACCU-CHEK AVIVA PLUS) test  strip Check BS TID Dx E11.9 300 each 3   insulin degludec (TRESIBA FLEXTOUCH) 100 UNIT/ML FlexTouch Pen Inject 60 Units into the skin daily. 15 mL 3   irbesartan (AVAPRO) 150 MG tablet TAKE 1 TABLET EVERY DAY FOR BLOOD PRESSURE 90 tablet 3   levothyroxine (SYNTHROID) 75 MCG tablet TAKE 1 TABLET EVERY DAY FOR THYROID 90 tablet 3   metFORMIN (GLUCOPHAGE) 1000 MG tablet TAKE 1 TABLET TWICE DAILY WITH MEALS FOR DIABETES 180 tablet 3   metoprolol tartrate (LOPRESSOR) 50 MG tablet TAKE 1/2 TABLET EVERY DAY FOR BLOOD PRESSURE AND HEART 45 tablet 3   Semaglutide, 1 MG/DOSE, (OZEMPIC, 1 MG/DOSE,) 4 MG/3ML SOPN Inject 1 mg into the skin once a week. 9 mL 1   Current Facility-Administered Medications on File Prior to Visit  Medication Dose Route Frequency Provider Last Rate Last Admin   cyanocobalamin (VITAMIN B12) injection 1,000 mcg  1,000 mcg Intramuscular Q30 days Mechele Claude, MD   1,000 mcg at 04/11/23 0941   cyanocobalamin (VITAMIN B12) injection 1,000 mcg  1,000 mcg Intramuscular Q30 days Mechele Claude, MD   1,000 mcg at 06/16/23 0831    ROS Review of Systems  Constitutional:  Negative for fever.  Respiratory:  Negative for shortness of breath.   Cardiovascular:  Negative for chest pain.  Musculoskeletal:  Negative for arthralgias.  Skin:  Negative for rash.    Objective:  BP 126/73   Pulse 73   Temp 99 F (37.2 C)   Ht 5\' 10"  (1.778 m)   Wt 168 lb (76.2 kg)   SpO2 98%   BMI 24.11 kg/m   BP Readings from Last 3 Encounters:  06/22/23 126/73  03/24/23 113/67  12/22/22 136/68    Wt Readings from Last 3 Encounters:  06/22/23 168 lb (76.2 kg)  05/20/23 164 lb (74.4 kg)  03/24/23 164 lb 2 oz (74.4 kg)     Physical Exam Vitals reviewed.  Constitutional:      Appearance: [name] [name] is well-developed.  HENT:     Head: Normocephalic and atraumatic.     Right Ear: External ear normal.     Left Ear: External ear normal.     Mouth/Throat:     Pharynx: No oropharyngeal exudate or  posterior oropharyngeal erythema.  Eyes:     Pupils: Pupils are equal, round, and reactive to light.  Cardiovascular:     Rate and Rhythm: Normal rate and regular rhythm.     Heart sounds: No murmur heard. Pulmonary:     Effort: No respiratory distress.     Breath sounds: Normal breath sounds.  Musculoskeletal:     Cervical back: Normal range of motion and neck supple.  Neurological:     Mental Status: [name] [name] is alert and oriented to person, place, and time.     Diabetic Foot Exam - Simple   No data filed     Lab Results  Component Value Date   HGBA1C 6.8 (H) 03/24/2023   HGBA1C 5.8 (H) 12/22/2022   HGBA1C 7.3 (H) 09/15/2022    Assessment & Plan:   [name] [name] was seen today for medical management of chronic issues.  Diagnoses and all orders for this visit:  Type 2  diabetes mellitus treated with insulin (HCC) -     Bayer DCA Hb A1c Waived  Mixed hyperlipidemia -     Lipid panel  Essential hypertension -     CBC with Differential/Platelet -     CMP14+EGFR  Hypothyroidism due to acquired atrophy of thyroid -     TSH + free T4  Type 2 diabetes mellitus with other specified complication, with long-term current use of insulin (HCC) -     Bayer DCA Hb A1c Waived   I am having [name] [name] maintain his acetaminophen, Cholecalciferol, Garlic, Accu-Chek Aviva Plus, Accu-Chek Aviva Plus, Accu-Chek FastClix Lancets, CINNAMON PO, aspirin EC, Ozempic (1 MG/DOSE), Tresiba FlexTouch, fluticasone, metoprolol tartrate, irbesartan, metFORMIN, levothyroxine, atorvastatin, and amLODipine. We will continue to administer cyanocobalamin and cyanocobalamin.  No orders of the defined types were placed in this encounter.    Follow-up: Return in about 3 months (around 09/22/2023).  Mechele Claude, M.D.

## 2023-06-23 LAB — LIPID PANEL
Chol/HDL Ratio: 1.7 ratio (ref 0.0–5.0)
Cholesterol, Total: 64 mg/dL — ABNORMAL LOW (ref 100–199)
HDL: 37 mg/dL — ABNORMAL LOW (ref >39)
LDL Chol Calc (NIH): 14 mg/dL (ref 0–99)
Triglycerides: 47 mg/dL (ref 0–149)
VLDL Cholesterol Cal: 13 mg/dL (ref 5–40)

## 2023-06-23 LAB — CBC WITH DIFFERENTIAL/PLATELET
Basophils Absolute: 0.1 10*3/uL (ref 0.0–0.2)
Basos: 1 %
EOS (ABSOLUTE): 0.5 10*3/uL — ABNORMAL HIGH (ref 0.0–0.4)
Eos: 7 %
Hematocrit: 43.2 % (ref 37.5–51.0)
Hemoglobin: 13.9 g/dL (ref 13.0–17.7)
Immature Grans (Abs): 0 10*3/uL (ref 0.0–0.1)
Immature Granulocytes: 0 %
Lymphocytes Absolute: 1.9 10*3/uL (ref 0.7–3.1)
Lymphs: 24 %
MCH: 28.1 pg (ref 26.6–33.0)
MCHC: 32.2 g/dL (ref 31.5–35.7)
MCV: 87 fL (ref 79–97)
Monocytes Absolute: 0.7 10*3/uL (ref 0.1–0.9)
Monocytes: 8 %
Neutrophils Absolute: 4.7 10*3/uL (ref 1.4–7.0)
Neutrophils: 60 %
Platelets: 222 10*3/uL (ref 150–450)
RBC: 4.94 x10E6/uL (ref 4.14–5.80)
RDW: 13.1 % (ref 11.6–15.4)
WBC: 7.9 10*3/uL (ref 3.4–10.8)

## 2023-06-23 LAB — CMP14+EGFR
ALT: 29 IU/L (ref 0–44)
AST: 25 IU/L (ref 0–40)
Albumin: 4.2 g/dL (ref 3.7–4.7)
Alkaline Phosphatase: 102 IU/L (ref 44–121)
BUN/Creatinine Ratio: 13 (ref 10–24)
BUN: 11 mg/dL (ref 8–27)
Bilirubin Total: 0.3 mg/dL (ref 0.0–1.2)
CO2: 24 mmol/L (ref 20–29)
Calcium: 9.5 mg/dL (ref 8.6–10.2)
Chloride: 102 mmol/L (ref 96–106)
Creatinine, Ser: 0.88 mg/dL (ref 0.76–1.27)
Globulin, Total: 2.2 g/dL (ref 1.5–4.5)
Glucose: 120 mg/dL — ABNORMAL HIGH (ref 70–99)
Potassium: 4.5 mmol/L (ref 3.5–5.2)
Sodium: 142 mmol/L (ref 134–144)
Total Protein: 6.4 g/dL (ref 6.0–8.5)
eGFR: 86 mL/min/{1.73_m2} (ref >59)

## 2023-06-23 LAB — TSH+FREE T4
Free T4: 1.44 ng/dL (ref 0.82–1.77)
TSH: 2.57 u[IU]/mL (ref 0.450–4.500)

## 2023-06-26 NOTE — Progress Notes (Signed)
Hello Aarik,  Your lab result is normal and/or stable.Some minor variations that are not significant are commonly marked abnormal, but do not represent any medical problem for you.  Best regards, Mechele Claude, M.D.

## 2023-07-02 ENCOUNTER — Other Ambulatory Visit: Payer: Self-pay | Admitting: Family Medicine

## 2023-07-15 ENCOUNTER — Ambulatory Visit: Payer: Medicare HMO

## 2023-07-15 DIAGNOSIS — D23122 Other benign neoplasm of skin of left lower eyelid, including canthus: Secondary | ICD-10-CM | POA: Diagnosis not present

## 2023-07-15 DIAGNOSIS — Z961 Presence of intraocular lens: Secondary | ICD-10-CM | POA: Diagnosis not present

## 2023-07-15 DIAGNOSIS — E119 Type 2 diabetes mellitus without complications: Secondary | ICD-10-CM | POA: Diagnosis not present

## 2023-07-15 DIAGNOSIS — H04123 Dry eye syndrome of bilateral lacrimal glands: Secondary | ICD-10-CM | POA: Diagnosis not present

## 2023-07-15 LAB — HM DIABETES EYE EXAM

## 2023-07-22 ENCOUNTER — Ambulatory Visit: Payer: Medicare HMO

## 2023-08-31 ENCOUNTER — Encounter: Payer: Self-pay | Admitting: Cardiology

## 2023-08-31 ENCOUNTER — Ambulatory Visit (INDEPENDENT_AMBULATORY_CARE_PROVIDER_SITE_OTHER): Admitting: Cardiology

## 2023-08-31 VITALS — BP 118/68 | HR 66 | Ht 70.0 in | Wt 169.0 lb

## 2023-08-31 DIAGNOSIS — E785 Hyperlipidemia, unspecified: Secondary | ICD-10-CM

## 2023-08-31 DIAGNOSIS — I2510 Atherosclerotic heart disease of native coronary artery without angina pectoris: Secondary | ICD-10-CM

## 2023-08-31 DIAGNOSIS — I10 Essential (primary) hypertension: Secondary | ICD-10-CM

## 2023-08-31 DIAGNOSIS — Z9861 Coronary angioplasty status: Secondary | ICD-10-CM | POA: Diagnosis not present

## 2023-08-31 DIAGNOSIS — E118 Type 2 diabetes mellitus with unspecified complications: Secondary | ICD-10-CM

## 2023-08-31 NOTE — Patient Instructions (Addendum)

## 2023-08-31 NOTE — Progress Notes (Signed)
  Cardiology Office Note:   Date:  08/31/2023  ID:  [name] [name], DOB 21-Jul-1940, MRN [id number] PCP: Roise Cleaver, MD  [facility] Providers Cardiologist:  Eilleen Grates, MD {  History of Present Illness:   [name] [name] is a 83 y.o. male who presents for follow up of multiple cardiovascular risk factors.  I saw him in Feb 2016 for evaluation of this same thing.  He has diabetes that is not well controlled.  He did have a POET (Plain Old Exercise Treadmill) in 2012 that was negative for ischemia but there was some brief atrial fib post test.  He had cardiac arrest in April 2020. cardiac catheterization showed severe proximal LAD stenosis and he underwent successful stenting and was extubated 08/23/2018.  His hospital course complicated by atrial fibrillation with RVR maintained on aspirin  Plavix  as well as Eliquis  and amiodarone .  He went to rehab after discharge   He presents for follow-up.  Since I last saw him he has done well.  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 The patient denies any new symptoms such as chest discomfort, neck or arm discomfort. There has been no new shortness of breath, PND or orthopnea. There have been no reported palpitations, presyncope or syncope.    ROS: As stated in the HPI and negative for all other systems.  Studies Reviewed:    EKG:   EKG Interpretation Date/Time:  Wednesday Aug 31 2023 13:27:27 EDT Ventricular Rate:  66 PR Interval:  214 QRS Duration:  90 QT Interval:  412 QTC Calculation: 431 R Axis:   4  Text Interpretation: Sinus rhythm with 1st degree A-V block Low voltage QRS Septal infarct , age undetermined When compared with ECG of 27-Aug-2022 10:00, No significant change since last tracing Confirmed by Eilleen Grates ([id number]) on 08/31/2023 1:44:04 PM    Risk Assessment/Calculations:              Physical Exam:   VS:  BP 118/68   Pulse 66   Ht 5\' 10"  (1.778 m)   Wt 169 lb (76.7 kg)   BMI 24.25 kg/m    Wt  Readings from Last 3 Encounters:  08/31/23 169 lb (76.7 kg)  06/22/23 168 lb (76.2 kg)  05/20/23 164 lb (74.4 kg)     GEN: Well nourished, well developed in no acute distress NECK: No JVD; No carotid bruits CARDIAC: RRR, no murmurs, rubs, gallops RESPIRATORY:  Clear to auscultation without rales, wheezing or rhonchi  ABDOMEN: Soft, non-tender, non-distended EXTREMITIES:  No edema; No deformity   ASSESSMENT AND PLAN:   CAD The patient has no new sypmtoms.  No further cardiovascular testing is indicated.  We will continue with aggressive risk reduction and meds as listed.   HTN The BP is at target.  No change in therapy.   HLD LDL was 14.  No change in therapy.    Type 2 DM A1c was 7.0 which is down from 8.1.  He will continue with meds as listed.      Follow up with me in one year.   Signed, Eilleen Grates, MD

## 2023-09-14 ENCOUNTER — Telehealth: Payer: Self-pay | Admitting: Family Medicine

## 2023-09-14 NOTE — Telephone Encounter (Signed)
 Copied from CRM [id number]. Topic: Clinical - Medication Question >> Sep 14, 2023 10:21 AM Ivette P wrote: Reason for CRM: pt caled in to notify he is on his last week of injections for ozempic .   Semaglutide , 1 MG/DOSE, (OZEMPIC , 1 MG/DOSE,) 4 MG/3ML SOPN  Pt would like to know if he can get a refill and go to the office to pick up.   Pt callback [phone]

## 2023-09-21 ENCOUNTER — Telehealth: Payer: Self-pay

## 2023-09-21 ENCOUNTER — Other Ambulatory Visit (HOSPITAL_COMMUNITY): Payer: Self-pay

## 2023-09-21 NOTE — Progress Notes (Signed)
 Pharmacy Medication Assistance Program Note    09/23/2023  Patient ID: [name] [name], male  DOB: 11/10/1940, 83 y.o.  MRN:  [id number]     09/21/2023  Outreach Medication Three  Manufacturer Medication Three Novo Nordisk  Nordisk Drugs Ozempic   Dose of Ozempic  1MG   Type of Sport and exercise psychologist  Date Application Sent to Prescriber 09/21/2023  Method Application Sent to [employer] submitted online.   Pcp pages emailed to [place] for completion.

## 2023-09-26 NOTE — Progress Notes (Signed)
 Pharmacy Medication Assistance Program Note    09/26/2023  Patient ID: [name] [name], male  DOB: 09-16-1940, 83 y.o.  MRN:  [id number]     09/21/2023  Outreach Medication Three  Manufacturer Medication Three Novo Nordisk  Nordisk Drugs Ozempic   Dose of Ozempic  1MG   Type of Sport and exercise psychologist  Date Application Sent to Prescriber 09/21/2023  Method Application Sent to Manufacturer Online  Patient Assistance Determination Approved  Approval Start Date 09/23/2023  Approval End Date 04/18/2024     Renewal approved

## 2023-09-29 ENCOUNTER — Ambulatory Visit: Admitting: Family Medicine

## 2023-09-29 ENCOUNTER — Encounter: Payer: Self-pay | Admitting: Family Medicine

## 2023-09-29 DIAGNOSIS — E119 Type 2 diabetes mellitus without complications: Secondary | ICD-10-CM

## 2023-09-29 DIAGNOSIS — I10 Essential (primary) hypertension: Secondary | ICD-10-CM

## 2023-09-29 DIAGNOSIS — E782 Mixed hyperlipidemia: Secondary | ICD-10-CM

## 2023-10-06 ENCOUNTER — Ambulatory Visit: Payer: Self-pay | Admitting: Family Medicine

## 2023-10-06 ENCOUNTER — Encounter: Payer: Self-pay | Admitting: Family Medicine

## 2023-10-06 ENCOUNTER — Ambulatory Visit: Admitting: Family Medicine

## 2023-10-06 VITALS — BP 105/66 | HR 81 | Temp 98.5°F | Ht 70.0 in | Wt 168.0 lb

## 2023-10-06 DIAGNOSIS — E1159 Type 2 diabetes mellitus with other circulatory complications: Secondary | ICD-10-CM | POA: Diagnosis not present

## 2023-10-06 DIAGNOSIS — E538 Deficiency of other specified B group vitamins: Secondary | ICD-10-CM

## 2023-10-06 DIAGNOSIS — E119 Type 2 diabetes mellitus without complications: Secondary | ICD-10-CM | POA: Diagnosis not present

## 2023-10-06 DIAGNOSIS — E1169 Type 2 diabetes mellitus with other specified complication: Secondary | ICD-10-CM

## 2023-10-06 DIAGNOSIS — Z794 Long term (current) use of insulin: Secondary | ICD-10-CM

## 2023-10-06 DIAGNOSIS — I10 Essential (primary) hypertension: Secondary | ICD-10-CM | POA: Diagnosis not present

## 2023-10-06 DIAGNOSIS — E782 Mixed hyperlipidemia: Secondary | ICD-10-CM | POA: Diagnosis not present

## 2023-10-06 LAB — BAYER DCA HB A1C WAIVED: HB A1C (BAYER DCA - WAIVED): 7.5 % — ABNORMAL HIGH (ref 4.8–5.6)

## 2023-10-06 LAB — LIPID PANEL

## 2023-10-06 NOTE — Progress Notes (Signed)
 Subjective:  Patient ID: [name] [name], male    DOB: [birth year]  Age: 83 y.o. MRN: [id number]  CC: Medical Management of Chronic Issues (3 month)   HPI [name] [name] presents for presents forFollow-up of diabetes. Patient checks blood sugar at home.   110-012 0  fasting and  postprandial Patient denies symptoms such as polyuria, polydipsia, excessive hunger, nausea No significant hypoglycemic spells noted. Medications reviewed. Pt reports taking them regularly without complication/adverse reaction being reported today.    presents for  follow-up of hypertension. Patient has no history of headache chest pain or shortness of breath or recent cough. Patient also denies symptoms of TIA such as focal numbness or weakness. Patient denies side effects from medication. States taking it regularly.   in for follow-up of elevated cholesterol. Doing well without complaints on current medication. Denies side effects of statin including myalgia and arthralgia and nausea. Currently no chest pain, shortness of breath or other cardiovascular related symptoms noted.   Lab Results  Component Value Date   HGBA1C 7.0 (H) 06/22/2023   HGBA1C 6.8 (H) 03/24/2023   HGBA1C 5.8 (H) 12/22/2022         10/06/2023   10:11 AM 06/22/2023    7:54 AM 05/20/2023    8:26 AM  Depression screen PHQ 2/9  Decreased Interest 0 0 0  Down, Depressed, Hopeless 0 0 0  PHQ - 2 Score 0 0 0  Altered sleeping 0 0 0  Tired, decreased energy 0 0 0  Change in appetite 0 0 0  Feeling bad or failure about yourself  0 0 0  Trouble concentrating 0 0 0  Moving slowly or fidgety/restless 0 0 0  Suicidal thoughts 0 0 0  PHQ-9 Score 0 0 0  Difficult doing work/chores Not difficult at all Not difficult at all Not difficult at all    History [name] [name] has a past medical history of Basal cell carcinoma, CAD (coronary artery disease), Cardiac arrest (HCC), Cataract, Diabetes mellitus without complication (HCC), Diverticulitis,  Erectile dysfunction, Gastroesophageal reflux disease with hiatal hernia, Goiter, nontoxic, multinodular, Hiatal hernia, Hyperlipidemia, Inguinal hernia, Kidney stone (1980), Leg fracture, Liver hemangioma, Nephrolithiasis, and Vitamin D  deficiency.   He has a past surgical history that includes LEFT HEART CATH AND CORONARY ANGIOGRAPHY (N/A, 08/25/2018); CORONARY STENT INTERVENTION (N/A, 08/25/2018); Laceration repair (2008); Back surgery (1984); Tibia fracture surgery; Cataract extraction (Bilateral); Eye surgery; Adjacent tissue transfer/tissue rearrangement (Left, 06/20/2020); Full thickness skin graft (Left, 06/20/2020); and skin cancer removal.   His family history includes Cancer in his father; Diabetes in an other family member; Early death (age of onset: 3) in his brother; Heart attack in his sister; Heart disease in his brother; Heart disease (age of onset: 31) in his mother.He reports that he quit smoking about 40 years ago. His smoking use included cigarettes. He started smoking about 67 years ago. He has a 27 pack-year smoking history. He has never used smokeless tobacco. He reports that he does not drink alcohol and does not use drugs.    ROS Review of Systems  Constitutional: Negative.   HENT: Negative.    Eyes:  Negative for visual disturbance.  Respiratory:  Negative for cough and shortness of breath.   Cardiovascular:  Negative for chest pain and leg swelling.  Gastrointestinal:  Negative for abdominal pain, constipation, diarrhea, nausea and vomiting.  Genitourinary:  Negative for difficulty urinating.  Musculoskeletal:  Negative for arthralgias and myalgias.  Skin:  Negative for rash.  Neurological:  Negative for headaches.  Psychiatric/Behavioral:  Negative for sleep disturbance.     Objective:  BP 105/66   Pulse 81   Temp 98.5 F (36.9 C)   Ht 5' 10 (1.778 m)   Wt 168 lb (76.2 kg)   SpO2 99%   BMI 24.11 kg/m   BP Readings from Last 3 Encounters:  10/06/23 105/66   08/31/23 118/68  06/22/23 126/73    Wt Readings from Last 3 Encounters:  10/06/23 168 lb (76.2 kg)  08/31/23 169 lb (76.7 kg)  06/22/23 168 lb (76.2 kg)     Physical Exam Vitals reviewed.  Constitutional:      Appearance: He is well-developed.  HENT:     Head: Normocephalic and atraumatic.     Right Ear: External ear normal.     Left Ear: External ear normal.     Mouth/Throat:     Pharynx: No oropharyngeal exudate or posterior oropharyngeal erythema.   Eyes:     Pupils: Pupils are equal, round, and reactive to light.    Cardiovascular:     Rate and Rhythm: Normal rate and regular rhythm.     Heart sounds: No murmur heard. Pulmonary:     Effort: No respiratory distress.     Breath sounds: Normal breath sounds.   Musculoskeletal:     Cervical back: Normal range of motion and neck supple.   Neurological:     Mental Status: He is alert and oriented to person, place, and time.      Assessment & Plan:  Type 2 diabetes mellitus treated with insulin  (HCC) -     CBC with Differential/Platelet -     CMP14+EGFR -     Lipid panel -     Bayer DCA Hb A1c Waived  Mixed hyperlipidemia -     Lipid panel  Essential hypertension -     CBC with Differential/Platelet -     CMP14+EGFR  Type 2 diabetes mellitus with other specified complication, with long-term current use of insulin  (HCC)  B12 deficiency -     CBC with Differential/Platelet     Follow-up: Return in about 3 months (around 01/06/2024).  Roise Cleaver, M.D.

## 2023-10-07 LAB — CBC WITH DIFFERENTIAL/PLATELET
Basophils Absolute: 0.1 10*3/uL (ref 0.0–0.2)
Basos: 1 %
EOS (ABSOLUTE): 0.2 10*3/uL (ref 0.0–0.4)
Eos: 3 %
Hematocrit: 45.9 % (ref 37.5–51.0)
Hemoglobin: 15 g/dL (ref 13.0–17.7)
Immature Grans (Abs): 0.1 10*3/uL (ref 0.0–0.1)
Immature Granulocytes: 1 %
Lymphocytes Absolute: 2.2 10*3/uL (ref 0.7–3.1)
Lymphs: 27 %
MCH: 28.6 pg (ref 26.6–33.0)
MCHC: 32.7 g/dL (ref 31.5–35.7)
MCV: 88 fL (ref 79–97)
Monocytes Absolute: 0.7 10*3/uL (ref 0.1–0.9)
Monocytes: 8 %
Neutrophils Absolute: 5.2 10*3/uL (ref 1.4–7.0)
Neutrophils: 60 %
Platelets: 228 10*3/uL (ref 150–450)
RBC: 5.24 x10E6/uL (ref 4.14–5.80)
RDW: 13.1 % (ref 11.6–15.4)
WBC: 8.4 10*3/uL (ref 3.4–10.8)

## 2023-10-07 LAB — LIPID PANEL
Cholesterol, Total: 67 mg/dL — ABNORMAL LOW (ref 100–199)
HDL: 34 mg/dL — ABNORMAL LOW (ref >39)
LDL CALC COMMENT:: 2 ratio (ref 0.0–5.0)
LDL Chol Calc (NIH): 20 mg/dL (ref 0–99)
Triglycerides: 52 mg/dL (ref 0–149)
VLDL Cholesterol Cal: 13 mg/dL (ref 5–40)

## 2023-10-07 LAB — CMP14+EGFR
ALT: 25 IU/L (ref 0–44)
AST: 28 IU/L (ref 0–40)
Albumin: 4.4 g/dL (ref 3.7–4.7)
Alkaline Phosphatase: 86 IU/L (ref 44–121)
BUN/Creatinine Ratio: 12 (ref 10–24)
BUN: 11 mg/dL (ref 8–27)
Bilirubin Total: 0.4 mg/dL (ref 0.0–1.2)
CO2: 22 mmol/L (ref 20–29)
Calcium: 9.7 mg/dL (ref 8.6–10.2)
Chloride: 104 mmol/L (ref 96–106)
Creatinine, Ser: 0.89 mg/dL (ref 0.76–1.27)
Globulin, Total: 2.2 g/dL (ref 1.5–4.5)
Glucose: 64 mg/dL — ABNORMAL LOW (ref 70–99)
Potassium: 5.2 mmol/L (ref 3.5–5.2)
Sodium: 141 mmol/L (ref 134–144)
Total Protein: 6.6 g/dL (ref 6.0–8.5)
eGFR: 85 mL/min/{1.73_m2} (ref >59)

## 2023-10-11 NOTE — Progress Notes (Signed)
Hello [name] [name],  Your lab result is normal and/or stable.Some minor variations that are not significant are commonly marked abnormal, but do not represent any medical problem for you.  Best regards, Mechele Claude, M.D.

## 2023-12-22 NOTE — Telephone Encounter (Signed)
 Has this shipment came in yet?! I got a refill form, but before sending it over, I wanted to make sure he got his original shipment one!

## 2023-12-26 ENCOUNTER — Telehealth: Payer: Self-pay

## 2023-12-26 NOTE — Telephone Encounter (Signed)
   In process of completing Novo Nordisk refills for patients OZEMPIC  medication.  Application emailed  to [employer] for [employer].

## 2024-01-12 ENCOUNTER — Encounter: Payer: Self-pay | Admitting: Family Medicine

## 2024-01-12 ENCOUNTER — Ambulatory Visit (INDEPENDENT_AMBULATORY_CARE_PROVIDER_SITE_OTHER): Admitting: Family Medicine

## 2024-01-12 VITALS — BP 131/71 | HR 60 | Temp 97.8°F | Ht 70.0 in | Wt 168.0 lb

## 2024-01-12 DIAGNOSIS — Z125 Encounter for screening for malignant neoplasm of prostate: Secondary | ICD-10-CM | POA: Diagnosis not present

## 2024-01-12 DIAGNOSIS — Z794 Long term (current) use of insulin: Secondary | ICD-10-CM | POA: Diagnosis not present

## 2024-01-12 DIAGNOSIS — E119 Type 2 diabetes mellitus without complications: Secondary | ICD-10-CM | POA: Diagnosis not present

## 2024-01-12 DIAGNOSIS — E782 Mixed hyperlipidemia: Secondary | ICD-10-CM | POA: Diagnosis not present

## 2024-01-12 DIAGNOSIS — I10 Essential (primary) hypertension: Secondary | ICD-10-CM

## 2024-01-12 DIAGNOSIS — E034 Atrophy of thyroid (acquired): Secondary | ICD-10-CM

## 2024-01-12 DIAGNOSIS — E538 Deficiency of other specified B group vitamins: Secondary | ICD-10-CM

## 2024-01-12 LAB — LIPID PANEL

## 2024-01-12 LAB — BAYER DCA HB A1C WAIVED: HB A1C (BAYER DCA - WAIVED): 7 % — ABNORMAL HIGH (ref 4.8–5.6)

## 2024-01-12 NOTE — Progress Notes (Signed)
 Subjective:  Patient ID: [name] [name], male    DOB: [birth year]  Age: 82 y.o. MRN: [id number]  CC: Medical Management of Chronic Issues   HPI  Discussed the use of AI scribe software for clinical note transcription with the patient, who gave verbal consent to proceed.  History of Present Illness [name] [name] is an 83 year old male with diabetes and hypertension who presents for medication management and follow-up.  He is managing his diabetes with Ozempic  at a dose of 1 mg, which has improved his A1c to 7.0. He reports having enough medication to last for three more weeks.  His hypertension is managed with metoprolol  at a dose of 50 mg daily, which is half of a 100 mg tablet. He initially experienced dizziness when he stopped the medication but reports that he now uses a half tablet daily.  He mentions confusion with his medications, particularly mixing up Ozempic  with Eliquis , which he mistakenly thought was causing stomach issues.  Regarding his thyroid , he reports no symptoms of cold intolerance or fatigue. No issues with constipation. He reports feeling hot rather than cold.          01/12/2024    8:36 AM 10/06/2023   10:11 AM 06/22/2023    7:54 AM  Depression screen PHQ 2/9  Decreased Interest 0 0 0  Down, Depressed, Hopeless 0 0 0  PHQ - 2 Score 0 0 0  Altered sleeping  0 0  Tired, decreased energy  0 0  Change in appetite  0 0  Feeling bad or failure about yourself   0 0  Trouble concentrating  0 0  Moving slowly or fidgety/restless  0 0  Suicidal thoughts  0 0  PHQ-9 Score  0 0  Difficult doing work/chores  Not difficult at all Not difficult at all    History [name] [name] has a past medical history of Basal cell carcinoma, CAD (coronary artery disease), Cardiac arrest (HCC), Cataract, Diabetes mellitus without complication (HCC), Diverticulitis, Erectile dysfunction, Gastroesophageal reflux disease with hiatal hernia, Goiter, nontoxic, multinodular, Hiatal hernia,  Hyperlipidemia, Inguinal hernia, Kidney stone (1980), Leg fracture, Liver hemangioma, Nephrolithiasis, and Vitamin D  deficiency.   He has a past surgical history that includes LEFT HEART CATH AND CORONARY ANGIOGRAPHY (N/A, 08/25/2018); CORONARY STENT INTERVENTION (N/A, 08/25/2018); Laceration repair (2008); Back surgery (1984); Tibia fracture surgery; Cataract extraction (Bilateral); Eye surgery; Adjacent tissue transfer/tissue rearrangement (Left, 06/20/2020); Full thickness skin graft (Left, 06/20/2020); and skin cancer removal.   His family history includes Cancer in his father; Diabetes in an other family member; Early death (age of onset: 3) in his brother; Heart attack in his sister; Heart disease in his brother; Heart disease (age of onset: 57) in his mother.He reports that he quit smoking about 40 years ago. His smoking use included cigarettes. He started smoking about 67 years ago. He has a 27 pack-year smoking history. He has never used smokeless tobacco. He reports that he does not drink alcohol and does not use drugs.    ROS Review of Systems  Constitutional: Negative.   HENT: Negative.    Eyes: Negative.  Negative for visual disturbance.  Respiratory:  Negative for cough and shortness of breath.   Cardiovascular:  Negative for chest pain and leg swelling.  Gastrointestinal:  Negative for abdominal pain, diarrhea, nausea and vomiting.  Genitourinary:  Negative for difficulty urinating.  Musculoskeletal:  Negative for arthralgias and myalgias.  Skin:  Negative for rash.  Neurological:  Negative for headaches.  Psychiatric/Behavioral:  Negative for sleep disturbance.     Objective:  BP 131/71   Pulse 60   Temp 97.8 F (36.6 C)   Ht 5' 10 (1.778 m)   Wt 168 lb (76.2 kg)   SpO2 98%   BMI 24.11 kg/m   BP Readings from Last 3 Encounters:  01/12/24 131/71  10/06/23 105/66  08/31/23 118/68    Wt Readings from Last 3 Encounters:  01/12/24 168 lb (76.2 kg)  10/06/23 168 lb (76.2  kg)  08/31/23 169 lb (76.7 kg)     Physical Exam Physical Exam GENERAL: Alert, cooperative, well developed, no acute distress. HEENT: Normocephalic, normal oropharynx, moist mucous membranes. CHEST: Clear to auscultation bilaterally, no wheezes, rhonchi, or crackles. CARDIOVASCULAR: Normal heart rate and rhythm, S1 and S2 normal without murmurs. ABDOMEN: Soft, non-tender, non-distended, without organomegaly, normal bowel sounds. EXTREMITIES: No cyanosis or edema. NEUROLOGICAL: Cranial nerves grossly intact, moves all extremities without gross motor or sensory deficit.   Assessment & Plan:  Prostate cancer screening -     PSA, total and free  Hypothyroidism due to acquired atrophy of thyroid  -     TSH + free T4  Type 2 diabetes mellitus treated with insulin  (HCC) -     CMP14+EGFR -     Bayer DCA Hb A1c Waived  Essential hypertension -     CBC with Differential/Platelet  Mixed hyperlipidemia -     Lipid panel    Assessment and Plan Assessment & Plan Type 2 diabetes mellitus   Type 2 diabetes mellitus is well-controlled with an A1c of 7.0. Continue Ozempic  1 mg weekly. Encourage regular physical activity and a healthy diet. Monitor A1c and adjust medication if levels increase.  Essential hypertension   Continue metoprolol  25 mg daily.  Acquired atrophy of thyroid    Thyroid  function is stable with no symptoms of hypothyroidism such as cold intolerance or fatigue. Weight is stable, and there are no issues with constipation. Continue current thyroid  management. Perform blood work to assess thyroid  function.       Follow-up: No follow-ups on file.  Butler Der, M.D.

## 2024-01-13 ENCOUNTER — Ambulatory Visit: Payer: Self-pay | Admitting: Family Medicine

## 2024-01-13 LAB — CMP14+EGFR
ALT: 33 IU/L (ref 0–44)
AST: 30 IU/L (ref 0–40)
Albumin: 4.1 g/dL (ref 3.7–4.7)
Alkaline Phosphatase: 99 IU/L (ref 48–129)
BUN/Creatinine Ratio: 10 (ref 10–24)
BUN: 8 mg/dL (ref 8–27)
Bilirubin Total: 0.3 mg/dL (ref 0.0–1.2)
CO2: 21 mmol/L (ref 20–29)
Calcium: 10.2 mg/dL (ref 8.6–10.2)
Chloride: 104 mmol/L (ref 96–106)
Creatinine, Ser: 0.82 mg/dL (ref 0.76–1.27)
Globulin, Total: 2.3 g/dL (ref 1.5–4.5)
Glucose: 120 mg/dL — AB (ref 70–99)
Potassium: 5.4 mmol/L — AB (ref 3.5–5.2)
Sodium: 141 mmol/L (ref 134–144)
Total Protein: 6.4 g/dL (ref 6.0–8.5)
eGFR: 87 mL/min/1.73 (ref >59)

## 2024-01-13 LAB — CBC WITH DIFFERENTIAL/PLATELET
Basophils Absolute: 0.1 x10E3/uL (ref 0.0–0.2)
Basos: 1 %
EOS (ABSOLUTE): 0.2 x10E3/uL (ref 0.0–0.4)
Eos: 2 %
Hematocrit: 44.7 % (ref 37.5–51.0)
Hemoglobin: 14.3 g/dL (ref 13.0–17.7)
Immature Grans (Abs): 0 x10E3/uL (ref 0.0–0.1)
Immature Granulocytes: 0 %
Lymphocytes Absolute: 1.6 x10E3/uL (ref 0.7–3.1)
Lymphs: 19 %
MCH: 28.5 pg (ref 26.6–33.0)
MCHC: 32 g/dL (ref 31.5–35.7)
MCV: 89 fL (ref 79–97)
Monocytes Absolute: 0.8 x10E3/uL (ref 0.1–0.9)
Monocytes: 10 %
Neutrophils Absolute: 5.6 x10E3/uL (ref 1.4–7.0)
Neutrophils: 67 %
Platelets: 212 x10E3/uL (ref 150–450)
RBC: 5.01 x10E6/uL (ref 4.14–5.80)
RDW: 13.2 % (ref 11.6–15.4)
WBC: 8.4 x10E3/uL (ref 3.4–10.8)

## 2024-01-13 LAB — LIPID PANEL
Cholesterol, Total: 72 mg/dL — AB (ref 100–199)
HDL: 34 mg/dL — AB (ref >39)
LDL CALC COMMENT:: 2.1 ratio (ref 0.0–5.0)
LDL Chol Calc (NIH): 26 mg/dL (ref 0–99)
Triglycerides: 44 mg/dL (ref 0–149)
VLDL Cholesterol Cal: 12 mg/dL (ref 5–40)

## 2024-01-13 LAB — TSH+FREE T4
Free T4: 1.48 ng/dL (ref 0.82–1.77)
TSH: 2.43 u[IU]/mL (ref 0.450–4.500)

## 2024-01-13 LAB — SPECIMEN STATUS REPORT

## 2024-01-13 LAB — PSA, TOTAL AND FREE
PSA, Free Pct: 16.3 %
PSA, Free: 0.52 ng/mL
Prostate Specific Ag, Serum: 3.2 ng/mL (ref 0.0–4.0)

## 2024-01-13 NOTE — Progress Notes (Signed)
Hello Aarik,  Your lab result is normal and/or stable.Some minor variations that are not significant are commonly marked abnormal, but do not represent any medical problem for you.  Best regards, Mechele Claude, M.D.

## 2024-01-14 LAB — B12 AND FOLATE PANEL
Folate: 16.4 ng/mL (ref >3.0)
Vitamin B-12: 222 pg/mL — ABNORMAL LOW (ref 232–1245)

## 2024-01-14 LAB — SPECIMEN STATUS REPORT

## 2024-01-16 NOTE — Progress Notes (Signed)
Vitamin B12 is low. Needs to get injections, one ml weekly for a month then one ml monthly.

## 2024-02-07 ENCOUNTER — Telehealth: Payer: Self-pay

## 2024-02-07 NOTE — Progress Notes (Signed)
 Care Guide Pharmacy Note  02/07/2024 Name: AIDYN KELLIS MRN: [id number] DOB: Jun 11, 1940  Referred By: Zollie Lowers, MD Reason for referral: Complex Care Management (Outreach to schedule with Pharm d )   LENNELL SHANKS is a 83 y.o. year old male who is a primary care patient of Stacks, Lowers, MD.  BURTIS IMHOFF was referred to the pharmacist for assistance related to: DMII  An unsuccessful telephone outreach was attempted today to contact the patient who was referred to the pharmacy team for assistance with medication assistance. Additional attempts will be made to contact the patient.  Jeoffrey Buffalo , RMA     [facility]  [facility], [facility] Guide  Direct Dial: [phone]  Website: [email or web address]

## 2024-02-08 ENCOUNTER — Other Ambulatory Visit: Payer: Self-pay | Admitting: Family Medicine

## 2024-02-08 DIAGNOSIS — I10 Essential (primary) hypertension: Secondary | ICD-10-CM

## 2024-02-08 DIAGNOSIS — E119 Type 2 diabetes mellitus without complications: Secondary | ICD-10-CM

## 2024-02-08 NOTE — Telephone Encounter (Signed)
 Faxed completed refill form to novo nordisk.

## 2024-02-10 ENCOUNTER — Encounter: Payer: Self-pay | Admitting: *Deleted

## 2024-02-13 ENCOUNTER — Ambulatory Visit

## 2024-02-13 DIAGNOSIS — E538 Deficiency of other specified B group vitamins: Secondary | ICD-10-CM

## 2024-02-13 DIAGNOSIS — Z23 Encounter for immunization: Secondary | ICD-10-CM

## 2024-02-13 NOTE — Progress Notes (Unsigned)
 02/14/2024 Name: [name] [name] MRN: [id number] DOB: 21-Nov-1940  Chief Complaint  Patient presents with   Diabetes   Medication Access    [name] [name] is a 83 y.o. year old male who was referred for medication management by their primary care provider, Zollie Lowers, MD. They presented for a face to face visit today. They were referred to the pharmacist by their PCP for assistance in managing medication access    Subjective: [name] [name] presents to clinic today for a follow-up regarding medication access. He is currently on the Novo Nordisk patient assistance program for his Tresiba  and Ozempic . He is aware that his coverage will end in 2026. He is insured through [employer], which he will be re-enrolling in next year. He is eligible for Medicare Extra Help, which would help him afford his medications come next year. He denies any other issues or concerns with his medication. He reports tolerating his medication well without any missed doses in the past two weeks.   Care Team: Primary Care Provider: Zollie Lowers, MD ; Next Scheduled Visit: 02/29/2026  Medication Access/Adherence Current Pharmacy:  [facility] [address] [place] - [address] [place] [address] Phone: [phone] Fax: [phone]  [facility] - [place], [place] - [address] [place] [address] Phone: [phone] Fax: [phone]   Patient reports affordability concerns with their medications: Yes  - currently on Novo Nordisk PAP, the program will not exist in 2026 Patient reports access/transportation concerns to their pharmacy: No  Patient reports adherence concerns with their medications:  No    Diabetes: Current medications: Tresiba  60 units daily, metformin  1000 mg BID, Ozempic   1 mg weekly Statin: atorvastatin  80 mg LDL of 26 on 01/12/24 ACEi/ARB: irbesartan  150 mg  UACR of 7 on 03/24/23  A1c of 7% on 01/12/24, decreased from  7.5% on 10/06/23  Patient denies hypoglycemic s/sx including dizziness, shakiness, sweating. Patient denies hyperglycemic symptoms including polyuria, polydipsia, polyphagia, nocturia, neuropathy, blurred vision.  Current meal patterns:  Discussed meal planning options and Plate method for healthy eating Avoid sugary drinks and desserts Incorporate balanced protein, non starchy veggies, 1 serving of carbohydrate with each meal Increase water intake Increase physical activity as able  Current medication access support:  Novo Nordisk PAP (Tresiba , Ozempic )   Objective: Lab Results  Component Value Date   HGBA1C 7.0 (H) 01/12/2024   Lab Results  Component Value Date   CREATININE 0.82 01/12/2024   BUN 8 01/12/2024   NA 141 01/12/2024   K 5.4 (H) 01/12/2024   CL 104 01/12/2024   CO2 21 01/12/2024   Lab Results  Component Value Date   CHOL 72 (L) 01/12/2024   HDL 34 (L) 01/12/2024   LDLCALC 26 01/12/2024   TRIG 44 01/12/2024   CHOLHDL 2.1 01/12/2024   Medications Reviewed Today     Reviewed by Bernette Falling, [facility] (Pharmacist) on 02/14/24 at 1237  Med List Status: <None>   Medication Order Taking? Sig Documenting Provider Last Dose Status Informant  Accu-Chek FastClix Lancets MISC [id number]  Check BS TID Dx E11.9 Zollie Lowers, MD  Active Self  acetaminophen  (TYLENOL ) 325 MG tablet [id number]  Take 2 tablets (650 mg total) by mouth every 4 (four) hours as needed for mild pain or headache. Pegge Toribio PARAS, PA-C  Active Self  amLODipine  (NORVASC ) 5 MG tablet [id number] Yes TAKE 1 TABLET EVERY DAY FOR BLOOD PRESSURE Zollie Lowers, MD  Active  aspirin  EC 81 MG tablet [id number] Yes Take 1 tablet (81 mg total) by mouth daily. Lavona Agent, MD  Active Self  atorvastatin  (LIPITOR ) 80 MG tablet [id number] Yes TAKE 1 TABLET EVERY DAY FOR CHOLESTEROL Zollie Lowers, MD  Active   Blood Glucose Monitoring Suppl (ACCU-CHEK AVIVA PLUS) w/Device KIT [id number]  Check BS TID Dx E11.9  Zollie Lowers, MD  Active Self  Cholecalciferol 125 MCG (5000 UT) capsule [id number]  Take 5,000 Units by mouth. Take one tablet three times weekly  [provider]  Active Self  CINNAMON PO [id number] Yes Take by mouth 2 (two) times daily.  [provider]  Active Self  cyanocobalamin  (VITAMIN B12) injection 1,000 mcg [id number]   Zollie Lowers, MD  Active   cyanocobalamin  (VITAMIN B12) injection 1,000 mcg [id number]   Stacks, Warren, MD  Active   fluticasone  (FLONASE ) 50 MCG/ACT nasal spray [id number]  USE 2 SPRAYS IN EACH NOSTRIL DAILY AS NEEDED FOR ALLERGIES Zollie Lowers, MD  Active   Garlic 100 MG TABS [id number]  Take by mouth 2 (two) times daily.  [provider]  Active Self  glucose blood (ACCU-CHEK AVIVA PLUS) test strip [id number]  Check BS TID Dx E11.9 Zollie Lowers, MD  Active Self  insulin  degludec (TRESIBA  FLEXTOUCH) 100 UNIT/ML FlexTouch Pen [id number] Yes Inject 60 Units into the skin daily. Zollie Lowers, MD  Active Self           Med Note TENA MLISS JONETTA [place] Jan 14, 2022  1:09 PM) Via novo nordisk patient assistance program    irbesartan  (AVAPRO ) 150 MG tablet [id number] Yes TAKE 1 TABLET EVERY DAY FOR BLOOD PRESSURE Zollie Lowers, MD  Active   levothyroxine  (SYNTHROID ) 75 MCG tablet [id number] Yes TAKE 1 TABLET EVERY DAY FOR THYROID  Zollie Lowers, MD  Active   metFORMIN  (GLUCOPHAGE ) 1000 MG tablet [id number] Yes TAKE 1 TABLET TWICE DAILY WITH MEALS FOR DIABETES Zollie Lowers, MD  Active   metoprolol  tartrate (LOPRESSOR ) 50 MG tablet [id number] Yes TAKE 1/2 TABLET EVERY DAY FOR BLOOD PRESSURE AND HEART Stacks, Lowers, MD  Active   Semaglutide , 1 MG/DOSE, (OZEMPIC , 1 MG/DOSE,) 4 MG/3ML SOPN [id number] Yes Inject 1 mg into the skin once a week. Zollie Lowers, MD  Active Self           Med Note TENA MLISS JONETTA [place] Jan 14, 2022  1:09 PM) Via novo nordisk patient assistance program    Med List Note Napolean Lowers, [place] 12/08/21 [phone]): Concerned med  causing loss of appetite.             Assessment/Plan:  Diabetes: Currently controlled. Goal of <7% Cardiorenal risk reduction is opportunities for improvement. Such as initiating an SGLT2i; noted allergy to Invokana  with rash.  Blood pressure is not at goal of <130/80 mmHg LDL is at goal of <55 mg/dL Reviewed long term cardiovascular and renal outcomes of uncontrolled blood sugar Reviewed goal A1c, goal fasting, and goal 2 hour post prandial glucose Recommend to:  Continue all diabetes medications as prescribed Pending ExtraHelp Application assistance Recommend to check glucose daily Meets financial criteria for Medicare ExtraHelp, will begin working on application   Follow Up Plan:  PCP: 05/29/2023 PharmD: 1 week For follow-up with Medicare ExtraHelp application  Woodie Jock, PharmD PGY1 Pharmacy Resident  02/14/2024  Mliss Tarry Griffin, PharmD, BCACP, CPP Clinical Pharmacist, [facility]

## 2024-02-13 NOTE — Progress Notes (Signed)
 Patient is in office today for a nurse visit for B12 Injection. Patient Injection was given in the  Right deltoid. Patient tolerated injection well.

## 2024-02-14 ENCOUNTER — Ambulatory Visit: Admitting: Pharmacist

## 2024-02-14 DIAGNOSIS — Z7985 Long-term (current) use of injectable non-insulin antidiabetic drugs: Secondary | ICD-10-CM

## 2024-02-14 DIAGNOSIS — Z794 Long term (current) use of insulin: Secondary | ICD-10-CM | POA: Diagnosis not present

## 2024-02-14 DIAGNOSIS — E119 Type 2 diabetes mellitus without complications: Secondary | ICD-10-CM | POA: Diagnosis not present

## 2024-02-17 ENCOUNTER — Encounter: Payer: Self-pay | Admitting: *Deleted

## 2024-02-21 ENCOUNTER — Other Ambulatory Visit (INDEPENDENT_AMBULATORY_CARE_PROVIDER_SITE_OTHER): Payer: Self-pay

## 2024-02-21 NOTE — Progress Notes (Signed)
 02/21/2024 Name: [name] [name] MRN: [id number] DOB: [birth year]  No chief complaint on file.   [name] [name] is a 83 y.o. year old male who was referred for medication management by their primary care provider, Zollie Lowers, MD. They presented for a face to face visit today. They were referred to the pharmacist by their PCP for assistance in managing medication access    Subjective: [name] [name] presents to clinic today for a follow-up regarding medication access. He is currently on the Novo Nordisk patient assistance program for his Tresiba  and Ozempic . He is aware that his coverage will end in 2026. He is insured through [employer], which he will be re-enrolling in next year. He is eligible for Medicare Extra Help, which would help him afford his medications come next year. He denies any other issues or concerns with his medication. He reports tolerating his medication well without any missed doses in the past two weeks.   Care Team: Primary Care Provider: Zollie Lowers, MD ; Next Scheduled Visit: 02/29/2026  Medication Access/Adherence Current Pharmacy:  [facility] [address] [place] - [address] [place] [address] Phone: [phone] Fax: [phone]  [facility] - [place], [place] - [address] [place] [address] Phone: [phone] Fax: [phone]   Patient reports affordability concerns with their medications: Yes  - currently on Novo Nordisk PAP, the program will not exist in 2026 Patient reports access/transportation concerns to their pharmacy: No  Patient reports adherence concerns with their medications:  No    Diabetes: Current medications: Tresiba  60 units daily, metformin  1000 mg BID, Ozempic   1 mg weekly Statin: atorvastatin  80 mg LDL of 26 on 01/12/24 ACEi/ARB: irbesartan  150 mg  UACR of 7 on 03/24/23  A1c of 7% on 01/12/24, decreased from 7.5% on 10/06/23  Patient denies  hypoglycemic s/sx including dizziness, shakiness, sweating. Patient denies hyperglycemic symptoms including polyuria, polydipsia, polyphagia, nocturia, neuropathy, blurred vision.  Current meal patterns:  Discussed meal planning options and Plate method for healthy eating Avoid sugary drinks and desserts Incorporate balanced protein, non starchy veggies, 1 serving of carbohydrate with each meal Increase water intake Increase physical activity as able  Current medication access support:  Novo Nordisk PAP (Tresiba , Ozempic )   Objective: Lab Results  Component Value Date   HGBA1C 7.0 (H) 01/12/2024   Lab Results  Component Value Date   CREATININE 0.82 01/12/2024   BUN 8 01/12/2024   NA 141 01/12/2024   K 5.4 (H) 01/12/2024   CL 104 01/12/2024   CO2 21 01/12/2024   Lab Results  Component Value Date   CHOL 72 (L) 01/12/2024   HDL 34 (L) 01/12/2024   LDLCALC 26 01/12/2024   TRIG 44 01/12/2024   CHOLHDL 2.1 01/12/2024   Medications Reviewed Today     Reviewed by Billee Mliss BIRCH, [facility] (Pharmacist) on 02/21/24 at 1337  Med List Status: <None>   Medication Order Taking? Sig Documenting Provider Last Dose Status Informant  Accu-Chek FastClix Lancets MISC [id number]  Check BS TID Dx E11.9 Zollie Lowers, MD  Active Self  acetaminophen  (TYLENOL ) 325 MG tablet [id number]  Take 2 tablets (650 mg total) by mouth every 4 (four) hours as needed for mild pain or headache. Pegge Toribio PARAS, PA-C  Active Self  amLODipine  (NORVASC ) 5 MG tablet [id number]  TAKE 1 TABLET EVERY DAY FOR BLOOD PRESSURE Zollie Lowers, MD  Active   aspirin  EC 81 MG tablet [id number]  Take 1 tablet (81 mg total) by mouth daily. Lavona Agent, MD  Active Self  atorvastatin  (LIPITOR ) 80 MG tablet [id number]  TAKE 1 TABLET EVERY DAY FOR CHOLESTEROL Zollie Lowers, MD  Active   Blood Glucose Monitoring Suppl (ACCU-CHEK AVIVA PLUS) w/Device KIT [id number]  Check BS TID Dx E11.9 Zollie Lowers, MD  Active Self   Cholecalciferol 125 MCG (5000 UT) capsule [id number]  Take 5,000 Units by mouth. Take one tablet three times weekly  [provider]  Active Self  CINNAMON PO [id number]  Take by mouth 2 (two) times daily.  [provider]  Active Self  cyanocobalamin  (VITAMIN B12) injection 1,000 mcg [id number]   Zollie Lowers, MD  Active   cyanocobalamin  (VITAMIN B12) injection 1,000 mcg [id number]   Stacks, Warren, MD  Active   fluticasone  (FLONASE ) 50 MCG/ACT nasal spray [id number]  USE 2 SPRAYS IN EACH NOSTRIL DAILY AS NEEDED FOR ALLERGIES Zollie Lowers, MD  Active   Garlic 100 MG TABS [id number]  Take by mouth 2 (two) times daily.  [provider]  Active Self  glucose blood (ACCU-CHEK AVIVA PLUS) test strip [id number]  Check BS TID Dx E11.9 Zollie Lowers, MD  Active Self  insulin  degludec (TRESIBA  FLEXTOUCH) 100 UNIT/ML FlexTouch Pen [id number]  Inject 60 Units into the skin daily. Zollie Lowers, MD  Active Self           Med Note TENA MLISS JONETTA [place] Jan 14, 2022  1:09 PM) Via novo nordisk patient assistance program    irbesartan  (AVAPRO ) 150 MG tablet [id number]  TAKE 1 TABLET EVERY DAY FOR BLOOD PRESSURE Zollie Lowers, MD  Active   levothyroxine  (SYNTHROID ) 75 MCG tablet [id number]  TAKE 1 TABLET EVERY DAY FOR THYROID  Zollie Lowers, MD  Active   metFORMIN  (GLUCOPHAGE ) 1000 MG tablet [id number]  TAKE 1 TABLET TWICE DAILY WITH MEALS FOR DIABETES Zollie Lowers, MD  Active   metoprolol  tartrate (LOPRESSOR ) 50 MG tablet [id number]  TAKE 1/2 TABLET EVERY DAY FOR BLOOD PRESSURE AND HEART Zollie Lowers, MD  Active   Semaglutide , 1 MG/DOSE, (OZEMPIC , 1 MG/DOSE,) 4 MG/3ML SOPN [id number]  Inject 1 mg into the skin once a week. Zollie Lowers, MD  Active Self           Med Note TENA MLISS JONETTA [place] Jan 14, 2022  1:09 PM) Via novo nordisk patient assistance program    Med List Note Napolean Lowers, [place] 12/08/21 [phone]): Concerned med causing loss of appetite.              Assessment/Plan:  Diabetes: Currently controlled. Goal of <7% Cardiorenal risk reduction is opportunities for improvement. Such as initiating an SGLT2i; noted allergy to Invokana  with rash.  Blood pressure is not at goal of <130/80 mmHg LDL is at goal of <55 mg/dL Reviewed long term cardiovascular and renal outcomes of uncontrolled blood sugar Reviewed goal A1c, goal fasting, and goal 2 hour post prandial glucose Recommend to:  Continue all diabetes medications as prescribed Pending ExtraHelp Application assistance Recommend to check glucose daily Meets financial criteria for Medicare ExtraHelp, will begin working on application--called [employer] with patient on the phone and requested copy to be mailed to patient within 2 weeks.  Online application was locked and unable to be completed    Mliss Tarry Griffin, PharmD, BCACP, CPP Clinical Pharmacist, [facility]

## 2024-03-19 ENCOUNTER — Ambulatory Visit (INDEPENDENT_AMBULATORY_CARE_PROVIDER_SITE_OTHER): Payer: Self-pay | Admitting: *Deleted

## 2024-03-19 DIAGNOSIS — E538 Deficiency of other specified B group vitamins: Secondary | ICD-10-CM

## 2024-04-14 LAB — LAB REPORT - SCANNED
Albumin, Urine POC: 1.1
Creatinine, POC: 129 mg/dL
EGFR: 67
Microalb Creat Ratio: 9

## 2024-04-17 ENCOUNTER — Ambulatory Visit (INDEPENDENT_AMBULATORY_CARE_PROVIDER_SITE_OTHER): Admitting: Pharmacist

## 2024-04-17 DIAGNOSIS — Z794 Long term (current) use of insulin: Secondary | ICD-10-CM

## 2024-04-17 DIAGNOSIS — E119 Type 2 diabetes mellitus without complications: Secondary | ICD-10-CM

## 2024-04-17 DIAGNOSIS — Z7985 Long-term (current) use of injectable non-insulin antidiabetic drugs: Secondary | ICD-10-CM | POA: Diagnosis not present

## 2024-04-17 NOTE — Progress Notes (Signed)
 "  04/17/2024 Name: [name] [name] MRN: [id number] DOB: 11-15-1940  Chief Complaint  Patient presents with   Diabetes    [name] [name] is a 83 y.o. year old male who was referred for medication management by their primary care provider, Zollie Lowers, MD. They presented for a face to face visit today. They were referred to the pharmacist by their PCP for assistance in managing medication access    Subjective: [name] [name] presents to clinic today for a follow-up regarding medication access. He is currently on the Novo Nordisk patient assistance program for his Tresiba  and Ozempic . He is aware that his coverage will end in 2026. He is insured through [employer], which he will be re-enrolling in next year. He is eligible for Medicare Extra Help, which would help him afford his medications come next year. He denies any other issues or concerns with his medication. He reports tolerating his medication well without any missed doses.  Care Team: Primary Care Provider: Zollie Lowers, MD ; Next Scheduled Visit: 02/29/2026  Medication Access/Adherence Current Pharmacy:  [facility] [address] [place] - [address] [place] [address] Phone: [phone] Fax: [phone]  [facility] - [place], [place] - [address] [place] [address] Phone: [phone] Fax: [phone]   Patient reports affordability concerns with their medications: Yes  - currently on Novo Nordisk PAP, the program will not exist in 2026 Patient reports access/transportation concerns to their pharmacy: No  Patient reports adherence concerns with their medications:  No    Diabetes: Current medications: Tresiba  60 units daily, metformin  1000 mg BID, Ozempic  1 mg weekly Statin: atorvastatin  80 mg LDL of 26 on 01/12/24 ACEi/ARB: irbesartan  150 mg  UACR of 7 on 03/24/23  A1c of 7% on 01/12/24, decreased from 7.5% on 10/06/23  Patient denies  hypoglycemic s/sx including dizziness, shakiness, sweating. Patient denies hyperglycemic symptoms including polyuria, polydipsia, polyphagia, nocturia, neuropathy, blurred vision.  Current meal patterns:  Discussed meal planning options and Plate method for healthy eating Avoid sugary drinks and desserts Incorporate balanced protein, non starchy veggies, 1 serving of carbohydrate with each meal Increase water intake Increase physical activity as able  Current medication access support:  Novo Nordisk PAP (Tresiba , Ozempic )   Objective: Lab Results  Component Value Date   HGBA1C 7.0 (H) 01/12/2024   Lab Results  Component Value Date   CREATININE 0.82 01/12/2024   BUN 8 01/12/2024   NA 141 01/12/2024   K 5.4 (H) 01/12/2024   CL 104 01/12/2024   CO2 21 01/12/2024   Lab Results  Component Value Date   CHOL 72 (L) 01/12/2024   HDL 34 (L) 01/12/2024   LDLCALC 26 01/12/2024   TRIG 44 01/12/2024   CHOLHDL 2.1 01/12/2024   Medications Reviewed Today     Reviewed by Billee Mliss BIRCH, RPH-CPP (Pharmacist) on 04/17/24 at 1504  Med List Status: <None>   Medication Order Taking? Sig Documenting Provider Last Dose Status Informant  Accu-Chek FastClix Lancets MISC [id number]  Check BS TID Dx E11.9 Zollie Lowers, MD  Active Self  acetaminophen  (TYLENOL ) 325 MG tablet [id number]  Take 2 tablets (650 mg total) by mouth every 4 (four) hours as needed for mild pain or headache. Pegge Toribio PARAS, PA-C  Active Self  amLODipine  (NORVASC ) 5 MG tablet [id number]  TAKE 1 TABLET EVERY DAY FOR BLOOD PRESSURE Zollie Lowers, MD  Active   aspirin  EC 81 MG tablet [id number]  Take 1 tablet (81 mg total) by mouth daily. Lavona Agent, MD  Active Self  atorvastatin  (LIPITOR ) 80 MG tablet [id number]  TAKE 1 TABLET EVERY DAY FOR CHOLESTEROL Zollie Lowers, MD  Active   Blood Glucose Monitoring Suppl (ACCU-CHEK AVIVA PLUS) w/Device KIT [id number]  Check BS TID Dx E11.9 Zollie Lowers, MD  Active Self   Cholecalciferol 125 MCG (5000 UT) capsule [id number]  Take 5,000 Units by mouth. Take one tablet three times weekly  [provider]  Active Self  CINNAMON PO [id number]  Take by mouth 2 (two) times daily.  [provider]  Active Self  cyanocobalamin  (VITAMIN B12) injection 1,000 mcg [id number]   Zollie Lowers, MD  Active   fluticasone  (FLONASE ) 50 MCG/ACT nasal spray [id number]  USE 2 SPRAYS IN EACH NOSTRIL DAILY AS NEEDED FOR ALLERGIES Zollie Lowers, MD  Active   Garlic 100 MG TABS [id number]  Take by mouth 2 (two) times daily.  [provider]  Active Self  glucose blood (ACCU-CHEK AVIVA PLUS) test strip [id number]  Check BS TID Dx E11.9 Zollie Lowers, MD  Active Self  insulin  degludec (TRESIBA  FLEXTOUCH) 100 UNIT/ML FlexTouch Pen [id number]  Inject 60 Units into the skin daily. Zollie Lowers, MD  Active Self           Med Note TENA MLISS JONETTA [place] Jan 14, 2022  1:09 PM) Via novo nordisk patient assistance program    irbesartan  (AVAPRO ) 150 MG tablet [id number]  TAKE 1 TABLET EVERY DAY FOR BLOOD PRESSURE Zollie Lowers, MD  Active   levothyroxine  (SYNTHROID ) 75 MCG tablet [id number]  TAKE 1 TABLET EVERY DAY FOR THYROID  Zollie Lowers, MD  Active   metFORMIN  (GLUCOPHAGE ) 1000 MG tablet [id number]  TAKE 1 TABLET TWICE DAILY WITH MEALS FOR DIABETES Zollie Lowers, MD  Active   metoprolol  tartrate (LOPRESSOR ) 50 MG tablet [id number]  TAKE 1/2 TABLET EVERY DAY FOR BLOOD PRESSURE AND HEART Zollie Lowers, MD  Active   Semaglutide , 1 MG/DOSE, (OZEMPIC , 1 MG/DOSE,) 4 MG/3ML SOPN [id number]  Inject 1 mg into the skin once a week. Zollie Lowers, MD  Active Self           Med Note TENA MLISS JONETTA [place] Jan 14, 2022  1:09 PM) Via novo nordisk patient assistance program    Med List Note Napolean Lowers, [place] 12/08/21 [phone]): Concerned med causing loss of appetite.             Assessment/Plan:  Diabetes: Currently controlled. Goal of <7% Cardiorenal risk reduction is  opportunities for improvement. Such as initiating an SGLT2i; noted allergy to Invokana  with rash.  Blood pressure is not at goal of <130/80 mmHg LDL is at goal of <55 mg/dL Reviewed long term cardiovascular and renal outcomes of uncontrolled blood sugar Reviewed goal A1c, goal fasting, and goal 2 hour post prandial glucose Recommend to:  Continue all diabetes medications as prescribed Sample of Ozempic  0.5mg  weekly given today (pt may be able to maintain control on lower doses) OZEMPIC  SAMPLE exp 04/18/26 LOT [id number] Recommend to check glucose daily Meets financial criteria for Medicare ExtraHelp-->Applied with patient online again for LIS/ExtraHelp Application assistance; patient should receive letter in the mail stating outcome within 3 weeks      Mliss Tarry Griffin, PharmD, BCACP, CPP Clinical Pharmacist, [facility]    "

## 2024-04-21 ENCOUNTER — Other Ambulatory Visit: Payer: Self-pay | Admitting: Family Medicine

## 2024-04-21 DIAGNOSIS — E119 Type 2 diabetes mellitus without complications: Secondary | ICD-10-CM

## 2024-04-21 DIAGNOSIS — I10 Essential (primary) hypertension: Secondary | ICD-10-CM

## 2024-04-23 ENCOUNTER — Ambulatory Visit: Admitting: Family Medicine

## 2024-04-23 ENCOUNTER — Ambulatory Visit: Admitting: *Deleted

## 2024-04-23 DIAGNOSIS — E538 Deficiency of other specified B group vitamins: Secondary | ICD-10-CM

## 2024-04-23 NOTE — Progress Notes (Cosign Needed Addendum)
 Patient is in office today for a nurse visit for B12 Injection. Patient Injection was given in the  Right deltoid. Patient tolerated injection well.

## 2024-05-22 ENCOUNTER — Ambulatory Visit: Payer: Medicare HMO

## 2024-05-24 ENCOUNTER — Ambulatory Visit (INDEPENDENT_AMBULATORY_CARE_PROVIDER_SITE_OTHER): Admitting: *Deleted

## 2024-05-24 DIAGNOSIS — E538 Deficiency of other specified B group vitamins: Secondary | ICD-10-CM | POA: Diagnosis not present

## 2024-05-24 NOTE — Progress Notes (Signed)
 Patient is in office today for a nurse visit for B12 Injection. Patient Injection was given in the  Left deltoid. Patient tolerated injection well.

## 2024-05-28 ENCOUNTER — Ambulatory Visit: Payer: Self-pay | Admitting: Family Medicine

## 2024-06-21 ENCOUNTER — Ambulatory Visit

## 2024-07-10 ENCOUNTER — Ambulatory Visit
